# Patient Record
Sex: Female | Born: 1945
Health system: Southern US, Community
[De-identification: ages and names within clinical notes are randomized; demographics above are authoritative.]

## PROBLEM LIST (undated history)

## (undated) DIAGNOSIS — I10 Essential (primary) hypertension: Secondary | ICD-10-CM

## (undated) DIAGNOSIS — J449 Chronic obstructive pulmonary disease, unspecified: Secondary | ICD-10-CM

## (undated) DIAGNOSIS — K219 Gastro-esophageal reflux disease without esophagitis: Secondary | ICD-10-CM

## (undated) DIAGNOSIS — I5042 Chronic combined systolic (congestive) and diastolic (congestive) heart failure: Secondary | ICD-10-CM

## (undated) DIAGNOSIS — Z720 Tobacco use: Secondary | ICD-10-CM

## (undated) DIAGNOSIS — T7840XA Allergy, unspecified, initial encounter: Secondary | ICD-10-CM

## (undated) DIAGNOSIS — Z860100 Personal history of colon polyps, unspecified: Secondary | ICD-10-CM

## (undated) DIAGNOSIS — I272 Pulmonary hypertension, unspecified: Secondary | ICD-10-CM

## (undated) DIAGNOSIS — R0600 Dyspnea, unspecified: Secondary | ICD-10-CM

## (undated) DIAGNOSIS — I779 Disorder of arteries and arterioles, unspecified: Secondary | ICD-10-CM

## (undated) DIAGNOSIS — M1990 Unspecified osteoarthritis, unspecified site: Secondary | ICD-10-CM

## (undated) DIAGNOSIS — G459 Transient cerebral ischemic attack, unspecified: Secondary | ICD-10-CM

## (undated) DIAGNOSIS — I739 Peripheral vascular disease, unspecified: Secondary | ICD-10-CM

## (undated) DIAGNOSIS — E875 Hyperkalemia: Secondary | ICD-10-CM

## (undated) DIAGNOSIS — Z8719 Personal history of other diseases of the digestive system: Secondary | ICD-10-CM

## (undated) DIAGNOSIS — I420 Dilated cardiomyopathy: Secondary | ICD-10-CM

## (undated) DIAGNOSIS — D509 Iron deficiency anemia, unspecified: Secondary | ICD-10-CM

## (undated) DIAGNOSIS — I639 Cerebral infarction, unspecified: Secondary | ICD-10-CM

## (undated) DIAGNOSIS — N182 Chronic kidney disease, stage 2 (mild): Secondary | ICD-10-CM

## (undated) DIAGNOSIS — Z87898 Personal history of other specified conditions: Secondary | ICD-10-CM

## (undated) HISTORY — DX: Chronic combined systolic (congestive) and diastolic (congestive) heart failure: I50.42

## (undated) HISTORY — DX: Allergy, unspecified, initial encounter: T78.40XA

## (undated) HISTORY — DX: Transient cerebral ischemic attack, unspecified: G45.9

## (undated) HISTORY — DX: Iron deficiency anemia, unspecified: D50.9

## (undated) HISTORY — DX: Chronic obstructive pulmonary disease, unspecified: J44.9

## (undated) HISTORY — DX: Personal history of other diseases of the digestive system: Z87.19

## (undated) HISTORY — DX: Personal history of colonic polyps: Z86.010

## (undated) HISTORY — DX: Personal history of colon polyps, unspecified: Z86.0100

## (undated) HISTORY — DX: Dilated cardiomyopathy: I42.0

## (undated) HISTORY — DX: Pulmonary hypertension, unspecified: I27.20

## (undated) HISTORY — DX: Peripheral vascular disease, unspecified: I73.9

## (undated) HISTORY — DX: Chronic kidney disease, stage 2 (mild): N18.2

## (undated) HISTORY — DX: Unspecified osteoarthritis, unspecified site: M19.90

## (undated) HISTORY — DX: Hyperkalemia: E87.5

## (undated) HISTORY — PX: OTHER SURGICAL HISTORY: SHX169

## (undated) HISTORY — DX: Disorder of arteries and arterioles, unspecified: I77.9

## (undated) HISTORY — DX: Tobacco use: Z72.0

## (undated) HISTORY — DX: Cerebral infarction, unspecified: I63.9

## (undated) HISTORY — DX: Personal history of other specified conditions: Z87.898

## (undated) HISTORY — PX: TONSILLECTOMY: SUR1361

## (undated) HISTORY — DX: Gastro-esophageal reflux disease without esophagitis: K21.9

---

## 1979-06-09 HISTORY — PX: VAGINAL HYSTERECTOMY: SUR661

## 2005-02-07 ENCOUNTER — Emergency Department: Payer: Self-pay | Admitting: Internal Medicine

## 2009-04-29 ENCOUNTER — Inpatient Hospital Stay: Payer: Self-pay | Admitting: Orthopedic Surgery

## 2009-06-08 HISTORY — PX: ORIF DISTAL RADIUS FRACTURE: SUR927

## 2009-06-20 ENCOUNTER — Encounter: Payer: Self-pay | Admitting: Orthopedic Surgery

## 2009-07-09 ENCOUNTER — Encounter: Payer: Self-pay | Admitting: Orthopedic Surgery

## 2009-08-06 ENCOUNTER — Encounter: Payer: Self-pay | Admitting: Orthopedic Surgery

## 2009-09-06 ENCOUNTER — Encounter: Payer: Self-pay | Admitting: Orthopedic Surgery

## 2010-08-08 ENCOUNTER — Inpatient Hospital Stay: Payer: Self-pay | Admitting: Internal Medicine

## 2010-08-08 DIAGNOSIS — I059 Rheumatic mitral valve disease, unspecified: Secondary | ICD-10-CM

## 2010-08-16 ENCOUNTER — Inpatient Hospital Stay: Payer: Self-pay | Admitting: Internal Medicine

## 2010-09-05 ENCOUNTER — Ambulatory Visit: Payer: Self-pay | Admitting: Internal Medicine

## 2010-09-24 ENCOUNTER — Ambulatory Visit: Payer: Self-pay | Admitting: Internal Medicine

## 2010-10-03 ENCOUNTER — Other Ambulatory Visit: Payer: Self-pay | Admitting: Internal Medicine

## 2010-10-07 HISTORY — PX: COLONOSCOPY: SHX174

## 2010-10-14 ENCOUNTER — Ambulatory Visit: Payer: Self-pay | Admitting: Unknown Physician Specialty

## 2010-10-16 LAB — PATHOLOGY REPORT

## 2011-01-01 ENCOUNTER — Emergency Department: Payer: Self-pay | Admitting: Emergency Medicine

## 2011-01-06 ENCOUNTER — Inpatient Hospital Stay: Payer: Self-pay | Admitting: Internal Medicine

## 2011-01-06 ENCOUNTER — Ambulatory Visit: Payer: Self-pay | Admitting: Internal Medicine

## 2011-01-07 ENCOUNTER — Ambulatory Visit: Payer: Self-pay | Admitting: Internal Medicine

## 2011-01-16 ENCOUNTER — Ambulatory Visit: Payer: Self-pay | Admitting: Internal Medicine

## 2011-02-07 ENCOUNTER — Ambulatory Visit: Payer: Self-pay | Admitting: Internal Medicine

## 2011-02-10 ENCOUNTER — Inpatient Hospital Stay: Payer: Self-pay | Admitting: Internal Medicine

## 2011-02-11 DIAGNOSIS — R0602 Shortness of breath: Secondary | ICD-10-CM

## 2011-02-12 DIAGNOSIS — I509 Heart failure, unspecified: Secondary | ICD-10-CM

## 2011-02-24 ENCOUNTER — Encounter: Payer: Self-pay | Admitting: Cardiovascular Disease

## 2011-03-03 ENCOUNTER — Ambulatory Visit (INDEPENDENT_AMBULATORY_CARE_PROVIDER_SITE_OTHER): Payer: Self-pay | Admitting: Cardiovascular Disease

## 2011-03-03 ENCOUNTER — Encounter: Payer: Self-pay | Admitting: Cardiovascular Disease

## 2011-03-03 DIAGNOSIS — E785 Hyperlipidemia, unspecified: Secondary | ICD-10-CM

## 2011-03-03 DIAGNOSIS — F17200 Nicotine dependence, unspecified, uncomplicated: Secondary | ICD-10-CM | POA: Insufficient documentation

## 2011-03-03 DIAGNOSIS — I428 Other cardiomyopathies: Secondary | ICD-10-CM

## 2011-03-03 DIAGNOSIS — I2510 Atherosclerotic heart disease of native coronary artery without angina pectoris: Secondary | ICD-10-CM | POA: Insufficient documentation

## 2011-03-03 DIAGNOSIS — R0602 Shortness of breath: Secondary | ICD-10-CM | POA: Insufficient documentation

## 2011-03-03 MED ORDER — LISINOPRIL 10 MG PO TABS: 10.0000 mg | ORAL_TABLET | Freq: Every day | ORAL | Status: DC

## 2011-03-03 MED ORDER — FUROSEMIDE 20 MG PO TABS: 20.0000 mg | ORAL_TABLET | ORAL | Status: DC

## 2011-03-03 MED ORDER — SIMVASTATIN 20 MG PO TABS: 20.0000 mg | ORAL_TABLET | Freq: Every day | ORAL | Status: DC

## 2011-03-03 NOTE — Progress Notes (Signed)
Patient ID: Sandra Ellison, female    DOB: 1946/06/06, 65 y.o.   MRN: [id number]  HPI Comments: 65 year old woman with a history of peptic ulcer disease, systolic and diastolic CHF, COPD with long smoking history who continues to smoke, pulmonary hypertension seen on echocardiogram in March 2012 who presented to [facility] with increasing shortness of breath on February 11 2011. History of chronic renal insufficiency, stage II, severe iron deficiency anemia, She presents to the office to establish care and for followup from the hospital.  She reports that since her discharge, she has felt well. In the hospital, she was found to have CHF, moderate bilateral pleural effusions with BNP of 26,000. On her previous hospital admissions, she had not been given a diuretic and her shortness of breath had become aggressively worse.  There seems to have been some medication confusion. She reports that she has not been taking a diuretic but does have a prescription. She is taking Corag once a day. She denies any dizziness or lightheadedness. She is signing up for Medicare and this will be established later this year.  Echocardiogram performed February 11, 2011 shows ejection fraction less than 25% with severe global hypokinesis, apical akinesis, normal right ventricular systolic function , mild to moderate mitral valve regurgitation, normal right ventricular systolic pressures  Stress test done in the hospital on September 7 shows no significant ischemia. She has a dilated cart a moderately with ejection fraction 27%, old scar in the anteroseptal wall, inferior and inferolateral wall. This was a pharmacologic study  EKG today shows normal sinus rhythm with rate of 70 beats per minute T-wave abnormality in V2 through V6, one and aVL, diffuse low voltage    Outpatient Encounter Prescriptions as of 03/03/2011  Medication Sig Dispense Refill  . aspirin 81 MG tablet Take 81 mg by mouth daily.        . carvedilol  (COREG) 12.5 MG tablet Take 12.5 mg by mouth 2 (two) times daily with a meal.        . cetirizine (ZYRTEC) 10 MG tablet Take 10 mg by mouth daily.        . fluticasone (FLONASE) 50 MCG/ACT nasal spray Place 2 sprays into the nose daily.        . furosemide (LASIX) 20 MG tablet Take 1 tablet (20 mg total) by mouth every other day.   30 tablet  0  . pantoprazole (PROTONIX) 40 MG tablet Take 40 mg by mouth daily.        . potassium chloride (KLOR-CON) 10 MEQ CR tablet Take 10 mEq by mouth daily. As needed.       Marland Kitchen  lisinopril (PRINIVIL,ZESTRIL) 5 MG tablet Take 2.5 mg by mouth daily.        .  simvastatin (ZOCOR) 10 MG tablet Take 10 mg by mouth at bedtime.           Review of Systems  Constitutional: Negative.   HENT: Negative.   Eyes: Negative.   Respiratory: Negative.   Cardiovascular: Negative.   Gastrointestinal: Negative.   Musculoskeletal: Negative.   Skin: Negative.   Neurological: Negative.   Hematological: Negative.   Psychiatric/Behavioral: Negative.   All other systems reviewed and are negative.    BP 132/74  Pulse 73  Ht 5\' 5"  (1.651 m)  Wt 132 lb (59.875 kg)  BMI 21.97 kg/m2  Physical Exam  Nursing note and vitals reviewed. Constitutional: She is oriented to person, place, and time. She appears well-developed and well-nourished.  HENT:  Head: Normocephalic.  Nose: Nose normal.  Mouth/Throat: Oropharynx is clear and moist.  Eyes: Conjunctivae are normal. Pupils are equal, round, and reactive to light.  Neck: Normal range of motion. Neck supple. No JVD present.  Cardiovascular: Normal rate, regular rhythm, S1 normal, S2 normal and intact distal pulses.  Exam reveals no gallop and no friction rub.   Murmur heard.  Crescendo systolic murmur is present with a grade of 2/6       Bruit appreciated over the left subclavian  Pulmonary/Chest: Effort normal and breath sounds normal. No respiratory distress. She has no wheezes. She has no rales. She exhibits no  tenderness.  Abdominal: Soft. Bowel sounds are normal. She exhibits no distension. There is no tenderness.  Musculoskeletal: Normal range of motion. She exhibits no edema and no tenderness.  Lymphadenopathy:    She has no cervical adenopathy.  Neurological: She is alert and oriented to person, place, and time. Coordination normal.  Skin: Skin is warm and dry. No rash noted. No erythema.  Psychiatric: She has a normal mood and affect. Her behavior is normal. Judgment and thought content normal.         Assessment and Plan

## 2011-03-03 NOTE — Assessment & Plan Note (Signed)
We will increase his simvastatin to 20 mg daily and suggested we recheck her cholesterol in several months time.

## 2011-03-03 NOTE — Assessment & Plan Note (Signed)
Etiology of her cardiomyopathy with severely depressed ejection fraction is likely secondary to ischemia. Stress test shows several large regions of old scar though no active ischemia. No cardiac catheterization planned at this time unless she becomes more symptomatic. We did adjust her medications today and suggested she take Lasix every other day, take her Coreg 12.5 mg b.i.d. And increase the lisinopril in several weeks to 10 mg daily.

## 2011-03-03 NOTE — Assessment & Plan Note (Signed)
We did talk to her about coronary artery disease. This is likely the cause of her cardiomyopathy. We have stressed the importance of smoking cessation, aggressive cholesterol management.

## 2011-03-03 NOTE — Assessment & Plan Note (Signed)
We have suggested she has worsening shortness of breath that she take Lasix daily. This will be with potassium. If she continues to have weight gain and shortness of breath, we have suggested she take Lasix b.i.d..

## 2011-03-03 NOTE — Assessment & Plan Note (Signed)
We have strongly stressed the importance of smoking cessation. She will continue to work on this

## 2011-03-03 NOTE — Patient Instructions (Addendum)
Please take lasix/furosemide every other day Watch your weight. If it increases, take lasix every day Please increase the coreg to twice a day (12.5 mg ) Increase simvastatin to 20 mg daily In mid October, please increase lisinopril to 10 mg daily Stop smoking!!! Please call us if you have new issues that need to be addressed before your next appt.  We will call you for a follow up Appt. In 3 months

## 2011-03-04 ENCOUNTER — Encounter: Payer: Self-pay | Admitting: Cardiovascular Disease

## 2011-03-13 ENCOUNTER — Ambulatory Visit: Payer: Self-pay | Admitting: Internal Medicine

## 2011-03-16 ENCOUNTER — Other Ambulatory Visit: Payer: Self-pay

## 2011-04-06 ENCOUNTER — Encounter: Payer: Self-pay | Admitting: Cardiovascular Disease

## 2011-04-09 ENCOUNTER — Ambulatory Visit: Payer: Self-pay | Admitting: Internal Medicine

## 2011-04-20 ENCOUNTER — Other Ambulatory Visit: Payer: Self-pay

## 2011-04-20 MED ORDER — CARVEDILOL 12.5 MG PO TABS: 12.5000 mg | ORAL_TABLET | Freq: Two times a day (BID) | ORAL | Status: DC

## 2011-05-09 ENCOUNTER — Ambulatory Visit: Payer: Self-pay | Admitting: Internal Medicine

## 2011-06-08 ENCOUNTER — Ambulatory Visit (INDEPENDENT_AMBULATORY_CARE_PROVIDER_SITE_OTHER): Payer: 59 | Admitting: Cardiovascular Disease

## 2011-06-08 ENCOUNTER — Ambulatory Visit: Payer: Self-pay | Admitting: Cardiovascular Disease

## 2011-06-08 ENCOUNTER — Encounter: Payer: Self-pay | Admitting: Cardiovascular Disease

## 2011-06-08 DIAGNOSIS — I2510 Atherosclerotic heart disease of native coronary artery without angina pectoris: Secondary | ICD-10-CM

## 2011-06-08 DIAGNOSIS — R0602 Shortness of breath: Secondary | ICD-10-CM

## 2011-06-08 DIAGNOSIS — F17200 Nicotine dependence, unspecified, uncomplicated: Secondary | ICD-10-CM

## 2011-06-08 DIAGNOSIS — I428 Other cardiomyopathies: Secondary | ICD-10-CM

## 2011-06-08 DIAGNOSIS — E785 Hyperlipidemia, unspecified: Secondary | ICD-10-CM

## 2011-06-08 NOTE — Assessment & Plan Note (Signed)
Mild shortness of breath is likely secondary to underlying COPD. I am very concerned about recent upper respiratory tract infection and I suggested she go to acute care if this does not improve soon as she would likely need antibiotics given high risk of bronchitis.

## 2011-06-08 NOTE — Assessment & Plan Note (Signed)
We have suggested she stay on her simvastatin. Goal LDL less than 70.

## 2011-06-08 NOTE — Assessment & Plan Note (Signed)
Currently with no symptoms of angina. No further workup at this time. Continue current medication regimen. 

## 2011-06-08 NOTE — Assessment & Plan Note (Signed)
We have encouraged her to continue to work on weaning his cigarettes and smoking cessation. She will continue to work on this and does not want any assistance with chantix.   

## 2011-06-08 NOTE — Assessment & Plan Note (Addendum)
We have encouraged her to stay on her current medication regimen including diuretics, beta blocker, ACE inhibitor. She appears to be relatively asymptomatic. We will discuss with her whether she would like a repeat echocardiogram as her ejection fraction was significantly depressed earlier in the year. Uncertain if she will need a defibrillator given previous ejection fraction less than 35%.

## 2011-06-08 NOTE — Patient Instructions (Signed)
You are doing well. No medication changes were made.  Please call us if you have new issues that need to be addressed before your next appt.  Your physician wants you to follow-up in: 6 months.  You will receive a reminder letter in the mail two months in advance. If you don't receive a letter, please call our office to schedule the follow-up appointment.   

## 2011-06-08 NOTE — Progress Notes (Signed)
Patient ID: Sandra Ellison, female    DOB: [birth year], 65 y.o.   MRN: [id number]  HPI Comments: 65 year old woman with a history of peptic ulcer disease, systolic and diastolic CHF, COPD with long smoking history who continues to smoke, pulmonary hypertension seen on echocardiogram in March 2012 who presented to [facility] with increasing shortness of breath on February 11, 2011, chronic renal insufficiency, stage II, severe iron deficiency anemia, She presents to the office    In the hospital, she was found to have CHF, moderate bilateral pleural effusions with BNP of 26,000. On her previous hospital admissions, she had not been given a diuretic and her shortness of breath had become aggressively worse. Underlying anemia was a significant component of her presentation.  She has been receiving procrit every other week and has maintained her hemoglobin in the 9 range.  She did have a bone marrow biopsy, has received packed red blood cells x2, iron transfusion x2 She is followed by Dr. Sherrlyn Hock.  She was very worried recently as she started developing shortness of breath. She also had sore throat, cough, malaise and now believes it is a upper respiratory tract infection. She is still smoking 3 cigarettes per day  Echocardiogram performed February 11, 2011 shows ejection fraction less than 25% with severe global hypokinesis, apical akinesis, normal right ventricular systolic function , mild to moderate mitral valve regurgitation, normal right ventricular systolic pressures  Stress test done in the hospital on February 13 2011 shows no significant ischemia. She has a dilated cardiomyopathy with ejection fraction 27%, old scar in the anteroseptal wall, inferior and inferolateral wall. This was a pharmacologic study  EKG today shows normal sinus rhythm with rate of 70 beats per minute T-wave abnormality in V2 through V6, one and aVL, diffuse low voltage       Outpatient Encounter Prescriptions as of  06/08/2011  Medication Sig Dispense Refill  . aspirin 81 MG tablet Take 81 mg by mouth daily.        . carvedilol (COREG) 12.5 MG tablet Take 1 tablet (12.5 mg total) by mouth 2 (two) times daily with a meal.  60 tablet  3  . cetirizine (ZYRTEC) 10 MG tablet Take 10 mg by mouth daily.        . fluticasone (FLONASE) 50 MCG/ACT nasal spray Place 2 sprays into the nose daily.        . furosemide (LASIX) 20 MG tablet Take 1 tablet (20 mg total) by mouth every other day.  30 tablet  6  . lisinopril (PRINIVIL,ZESTRIL) 10 MG tablet Take 1 tablet (10 mg total) by mouth daily.  90 tablet  4  . pantoprazole (PROTONIX) 40 MG tablet Take 40 mg by mouth daily.        . potassium chloride (KLOR-CON) 10 MEQ CR tablet Take 10 mEq by mouth daily. As needed.       . simvastatin (ZOCOR) 20 MG tablet Take 1 tablet (20 mg total) by mouth at bedtime.  90 tablet  4     Review of Systems  Constitutional: Positive for fatigue.  HENT: Negative.   Eyes: Negative.   Respiratory: Positive for cough and shortness of breath.   Cardiovascular: Negative.   Gastrointestinal: Negative.   Musculoskeletal: Negative.   Skin: Negative.   Neurological: Negative.   Hematological: Negative.   Psychiatric/Behavioral: Negative.   All other systems reviewed and are negative.    BP 110/58  Pulse 65  Ht 5\' 5"  (1.651 m)  Wt 141  lb 12.8 oz (64.32 kg)  BMI 23.60 kg/m2  Physical Exam  Nursing note and vitals reviewed. Constitutional: She is oriented to person, place, and time. She appears well-developed and well-nourished.  HENT:  Head: Normocephalic.  Nose: Nose normal.  Mouth/Throat: Oropharynx is clear and moist.  Eyes: Conjunctivae are normal. Pupils are equal, round, and reactive to light.  Neck: Normal range of motion. Neck supple. No JVD present.  Cardiovascular: Normal rate, regular rhythm, S1 normal, S2 normal, normal heart sounds and intact distal pulses.  Exam reveals no gallop and no friction rub.   No murmur  heard. Pulmonary/Chest: Effort normal. No respiratory distress. She has decreased breath sounds. She has no wheezes. She has no rales. She exhibits no tenderness.  Abdominal: Soft. Bowel sounds are normal. She exhibits no distension. There is no tenderness.  Musculoskeletal: Normal range of motion. She exhibits no edema and no tenderness.  Lymphadenopathy:    She has no cervical adenopathy.  Neurological: She is alert and oriented to person, place, and time. Coordination normal.  Skin: Skin is warm and dry. No rash noted. No erythema.  Psychiatric: She has a normal mood and affect. Her behavior is normal. Judgment and thought content normal.         Assessment and Plan

## 2011-06-09 ENCOUNTER — Ambulatory Visit: Payer: Self-pay | Admitting: Internal Medicine

## 2011-06-10 ENCOUNTER — Telehealth: Payer: Self-pay | Admitting: *Deleted

## 2011-06-10 DIAGNOSIS — I519 Heart disease, unspecified: Secondary | ICD-10-CM

## 2011-06-10 NOTE — Telephone Encounter (Signed)
Per Dr. Mariah Milling, will order echo. If EF <30-35% (as before 25%), may need ICD. Attempted to contact pt, LMOM TCB.  Sandra Ellison, I put orders in, can you please schedule.

## 2011-06-16 LAB — CANCER CENTER HEMOGLOBIN: HGB: 11.3 g/dL — ABNORMAL LOW (ref 12.0–16.0)

## 2011-06-30 ENCOUNTER — Other Ambulatory Visit (INDEPENDENT_AMBULATORY_CARE_PROVIDER_SITE_OTHER): Payer: 59 | Admitting: *Deleted

## 2011-06-30 DIAGNOSIS — I519 Heart disease, unspecified: Secondary | ICD-10-CM

## 2011-06-30 DIAGNOSIS — I059 Rheumatic mitral valve disease, unspecified: Secondary | ICD-10-CM

## 2011-06-30 LAB — CANCER CENTER HEMOGLOBIN: HGB: 10.4 g/dL — ABNORMAL LOW (ref 12.0–16.0)

## 2011-07-10 ENCOUNTER — Ambulatory Visit: Payer: Self-pay | Admitting: Internal Medicine

## 2011-07-21 LAB — CANCER CENTER HEMOGLOBIN: HGB: 10.1 g/dL — ABNORMAL LOW (ref 12.0–16.0)

## 2011-07-28 LAB — CANCER CENTER HEMOGLOBIN: HGB: 8.7 g/dL — ABNORMAL LOW (ref 12.0–16.0)

## 2011-08-04 LAB — CBC CANCER CENTER
Basophil #: 0 x10 3/mm (ref 0.0–0.1)
Basophil %: 0.5 %
Eosinophil %: 1.2 %
HCT: 25.8 % — ABNORMAL LOW (ref 35.0–47.0)
HGB: 8.6 g/dL — ABNORMAL LOW (ref 12.0–16.0)
Lymphocyte %: 14.4 %
MCV: 87 fL (ref 80–100)
Monocyte %: 11.6 %
Neutrophil #: 5.4 x10 3/mm (ref 1.4–6.5)
WBC: 7.5 x10 3/mm (ref 3.6–11.0)

## 2011-08-04 LAB — IRON AND TIBC
Iron Bind.Cap.(Total): 331 ug/dL (ref 250–450)
Iron Saturation: 7 %
Unbound Iron-Bind.Cap.: 308 ug/dL

## 2011-08-07 ENCOUNTER — Ambulatory Visit: Payer: Self-pay | Admitting: Internal Medicine

## 2011-08-11 LAB — CANCER CENTER HEMOGLOBIN: HGB: 9.8 g/dL — ABNORMAL LOW (ref 12.0–16.0)

## 2011-08-17 ENCOUNTER — Other Ambulatory Visit: Payer: Self-pay | Admitting: Cardiovascular Disease

## 2011-08-17 ENCOUNTER — Other Ambulatory Visit: Payer: Self-pay | Admitting: *Deleted

## 2011-08-17 MED ORDER — FUROSEMIDE 20 MG PO TABS: 20.0000 mg | ORAL_TABLET | ORAL | Status: DC

## 2011-08-17 MED ORDER — CARVEDILOL 12.5 MG PO TABS: 12.5000 mg | ORAL_TABLET | Freq: Two times a day (BID) | ORAL | Status: DC

## 2011-08-17 NOTE — Telephone Encounter (Signed)
pt req refill for coreg 12.5 bid, sent in rx today for pt. Danielle Rankin

## 2011-08-18 LAB — CANCER CENTER HEMOGLOBIN: HGB: 10.1 g/dL — ABNORMAL LOW (ref 12.0–16.0)

## 2011-08-25 LAB — CANCER CENTER HEMOGLOBIN: HGB: 10.2 g/dL — ABNORMAL LOW (ref 12.0–16.0)

## 2011-09-01 LAB — CANCER CENTER HEMOGLOBIN: HGB: 10.8 g/dL — ABNORMAL LOW (ref 12.0–16.0)

## 2011-09-07 ENCOUNTER — Ambulatory Visit: Payer: Self-pay | Admitting: Internal Medicine

## 2011-09-08 ENCOUNTER — Other Ambulatory Visit: Payer: Self-pay | Admitting: Cardiovascular Disease

## 2011-09-10 ENCOUNTER — Telehealth: Payer: Self-pay

## 2011-09-10 MED ORDER — FUROSEMIDE 20 MG PO TABS: 20.0000 mg | ORAL_TABLET | ORAL | Status: DC

## 2011-09-10 NOTE — Telephone Encounter (Signed)
Refill sent for furosemide 20 mg take one tablet every other day.

## 2011-09-22 LAB — CANCER CENTER HEMOGLOBIN: HGB: 9.2 g/dL — ABNORMAL LOW (ref 12.0–16.0)

## 2011-09-29 LAB — CANCER CENTER HEMOGLOBIN: HGB: 10.3 g/dL — ABNORMAL LOW (ref 12.0–16.0)

## 2011-10-06 LAB — CANCER CENTER HEMOGLOBIN: HGB: 10.6 g/dL — ABNORMAL LOW (ref 12.0–16.0)

## 2011-10-07 ENCOUNTER — Ambulatory Visit: Payer: Self-pay | Admitting: Internal Medicine

## 2011-10-19 ENCOUNTER — Other Ambulatory Visit: Payer: Self-pay | Admitting: *Deleted

## 2011-10-19 MED ORDER — POTASSIUM CHLORIDE ER 10 MEQ PO TBCR: 10.0000 meq | EXTENDED_RELEASE_TABLET | Freq: Every day | ORAL | Status: DC

## 2011-10-19 NOTE — Telephone Encounter (Signed)
Refilled Potassium chloride.

## 2011-10-20 LAB — IRON AND TIBC
Iron Saturation: 17 %
Iron: 55 ug/dL (ref 50–170)
Unbound Iron-Bind.Cap.: 266 ug/dL

## 2011-10-20 LAB — CANCER CENTER HEMOGLOBIN: HGB: 10.4 g/dL — ABNORMAL LOW (ref 12.0–16.0)

## 2011-10-20 LAB — FERRITIN: Ferritin (ARMC): 59 ng/mL (ref 8–388)

## 2011-10-27 LAB — CBC CANCER CENTER
Basophil #: 0.1 x10 3/mm (ref 0.0–0.1)
Basophil %: 0.7 %
Eosinophil %: 1.9 %
Lymphocyte #: 0.9 x10 3/mm — ABNORMAL LOW (ref 1.0–3.6)
Lymphocyte %: 11.1 %
MCH: 29.5 pg (ref 26.0–34.0)
MCHC: 32.1 g/dL (ref 32.0–36.0)
Monocyte #: 0.7 x10 3/mm (ref 0.2–0.9)
Platelet: 237 x10 3/mm (ref 150–440)
RBC: 3.38 10*6/uL — ABNORMAL LOW (ref 3.80–5.20)
RDW: 15.4 % — ABNORMAL HIGH (ref 11.5–14.5)
WBC: 7.8 x10 3/mm (ref 3.6–11.0)

## 2011-11-07 ENCOUNTER — Ambulatory Visit: Payer: Self-pay | Admitting: Internal Medicine

## 2011-12-07 ENCOUNTER — Ambulatory Visit: Payer: 59 | Admitting: Cardiovascular Disease

## 2011-12-13 ENCOUNTER — Other Ambulatory Visit: Payer: Self-pay | Admitting: Cardiovascular Disease

## 2011-12-16 ENCOUNTER — Ambulatory Visit: Payer: 59 | Admitting: Cardiovascular Disease

## 2011-12-25 ENCOUNTER — Ambulatory Visit: Payer: 59 | Admitting: Cardiovascular Disease

## 2012-01-15 ENCOUNTER — Ambulatory Visit: Payer: 59 | Admitting: Cardiovascular Disease

## 2012-01-19 ENCOUNTER — Ambulatory Visit: Payer: Self-pay | Admitting: Internal Medicine

## 2012-01-19 LAB — FERRITIN: Ferritin (ARMC): 36 ng/mL (ref 8–388)

## 2012-01-19 LAB — IRON AND TIBC
Iron Bind.Cap.(Total): 328 ug/dL (ref 250–450)
Iron Saturation: 13 %
Iron: 43 ug/dL — ABNORMAL LOW (ref 50–170)

## 2012-01-19 LAB — CANCER CENTER HEMOGLOBIN: HGB: 7.8 g/dL — ABNORMAL LOW (ref 12.0–16.0)

## 2012-02-05 LAB — APTT: Activated PTT: 37.6 secs — ABNORMAL HIGH (ref 23.6–35.9)

## 2012-02-05 LAB — CBC
MCH: 32.6 pg (ref 26.0–34.0)
MCHC: 33.5 g/dL (ref 32.0–36.0)
MCV: 97 fL (ref 80–100)
Platelet: 316 10*3/uL (ref 150–440)
RDW: 13.7 % (ref 11.5–14.5)
WBC: 8 10*3/uL (ref 3.6–11.0)

## 2012-02-05 LAB — URINALYSIS, COMPLETE
Bilirubin,UR: NEGATIVE
Blood: NEGATIVE
Granular Cast: 15
Ketone: NEGATIVE
Nitrite: NEGATIVE
Ph: 5 (ref 4.5–8.0)
Protein: NEGATIVE
RBC,UR: 2 /HPF (ref 0–5)

## 2012-02-05 LAB — COMPREHENSIVE METABOLIC PANEL
Anion Gap: 4 — ABNORMAL LOW (ref 7–16)
Bilirubin,Total: 0.2 mg/dL (ref 0.2–1.0)
Calcium, Total: 8.3 mg/dL — ABNORMAL LOW (ref 8.5–10.1)
Chloride: 108 mmol/L — ABNORMAL HIGH (ref 98–107)
Co2: 25 mmol/L (ref 21–32)
Creatinine: 2.96 mg/dL — ABNORMAL HIGH (ref 0.60–1.30)
EGFR (African American): 18 — ABNORMAL LOW
EGFR (Non-African Amer.): 16 — ABNORMAL LOW
Glucose: 93 mg/dL (ref 65–99)
Potassium: 5.6 mmol/L — ABNORMAL HIGH (ref 3.5–5.1)
SGOT(AST): 19 U/L (ref 15–37)
Sodium: 137 mmol/L (ref 136–145)
Total Protein: 6.7 g/dL (ref 6.4–8.2)

## 2012-02-05 LAB — PROTIME-INR
INR: 1
Prothrombin Time: 13.9 secs (ref 11.5–14.7)

## 2012-02-05 LAB — CANCER CENTER HEMOGLOBIN: HGB: 8.5 g/dL — ABNORMAL LOW (ref 12.0–16.0)

## 2012-02-06 ENCOUNTER — Inpatient Hospital Stay: Payer: Self-pay | Admitting: Internal Medicine

## 2012-02-06 DIAGNOSIS — I059 Rheumatic mitral valve disease, unspecified: Secondary | ICD-10-CM

## 2012-02-06 LAB — CBC WITH DIFFERENTIAL/PLATELET
Basophil #: 0 10*3/uL (ref 0.0–0.1)
Basophil %: 0.6 %
Eosinophil #: 0.2 10*3/uL (ref 0.0–0.7)
HCT: 23.1 % — ABNORMAL LOW (ref 35.0–47.0)
HGB: 7.8 g/dL — ABNORMAL LOW (ref 12.0–16.0)
Lymphocyte %: 21.4 %
MCH: 32.3 pg (ref 26.0–34.0)
MCHC: 33.9 g/dL (ref 32.0–36.0)
Monocyte %: 10.8 %
Neutrophil #: 4.3 10*3/uL (ref 1.4–6.5)
Neutrophil %: 64.7 %

## 2012-02-06 LAB — BASIC METABOLIC PANEL
Anion Gap: 9 (ref 7–16)
BUN: 52 mg/dL — ABNORMAL HIGH (ref 7–18)
Chloride: 108 mmol/L — ABNORMAL HIGH (ref 98–107)
EGFR (African American): 15 — ABNORMAL LOW
EGFR (Non-African Amer.): 13 — ABNORMAL LOW

## 2012-02-06 LAB — MAGNESIUM: Magnesium: 1.8 mg/dL

## 2012-02-07 ENCOUNTER — Ambulatory Visit: Payer: Self-pay | Admitting: Internal Medicine

## 2012-02-07 LAB — BASIC METABOLIC PANEL
Anion Gap: 8 (ref 7–16)
Calcium, Total: 8.5 mg/dL (ref 8.5–10.1)
Chloride: 113 mmol/L — ABNORMAL HIGH (ref 98–107)
Co2: 21 mmol/L (ref 21–32)
EGFR (African American): 27 — ABNORMAL LOW
Glucose: 76 mg/dL (ref 65–99)
Osmolality: 292 (ref 275–301)
Potassium: 4.8 mmol/L (ref 3.5–5.1)

## 2012-02-19 LAB — CANCER CENTER HEMOGLOBIN: HGB: 9.1 g/dL — ABNORMAL LOW (ref 12.0–16.0)

## 2012-02-26 ENCOUNTER — Encounter: Payer: Self-pay | Admitting: Cardiovascular Disease

## 2012-02-26 ENCOUNTER — Telehealth: Payer: Self-pay

## 2012-02-26 ENCOUNTER — Ambulatory Visit (INDEPENDENT_AMBULATORY_CARE_PROVIDER_SITE_OTHER): Payer: Medicare HMO | Admitting: Cardiovascular Disease

## 2012-02-26 VITALS — BP 128/84 | HR 78 | Ht 65.0 in | Wt 150.2 lb

## 2012-02-26 DIAGNOSIS — R0602 Shortness of breath: Secondary | ICD-10-CM

## 2012-02-26 DIAGNOSIS — I2510 Atherosclerotic heart disease of native coronary artery without angina pectoris: Secondary | ICD-10-CM

## 2012-02-26 DIAGNOSIS — R42 Dizziness and giddiness: Secondary | ICD-10-CM

## 2012-02-26 DIAGNOSIS — IMO0001 Reserved for inherently not codable concepts without codable children: Secondary | ICD-10-CM

## 2012-02-26 DIAGNOSIS — E785 Hyperlipidemia, unspecified: Secondary | ICD-10-CM

## 2012-02-26 DIAGNOSIS — I428 Other cardiomyopathies: Secondary | ICD-10-CM

## 2012-02-26 DIAGNOSIS — F17200 Nicotine dependence, unspecified, uncomplicated: Secondary | ICD-10-CM

## 2012-02-26 DIAGNOSIS — R0989 Other specified symptoms and signs involving the circulatory and respiratory systems: Secondary | ICD-10-CM

## 2012-02-26 MED ORDER — TIOTROPIUM BROMIDE MONOHYDRATE 18 MCG IN CAPS: 18.0000 ug | ORAL_CAPSULE | Freq: Every day | RESPIRATORY_TRACT | Status: DC

## 2012-02-26 MED ORDER — ALBUTEROL SULFATE HFA 108 (90 BASE) MCG/ACT IN AERS: 2.0000 | INHALATION_SPRAY | Freq: Four times a day (QID) | RESPIRATORY_TRACT | Status: DC | PRN

## 2012-02-26 MED ORDER — LISINOPRIL 10 MG PO TABS: 10.0000 mg | ORAL_TABLET | Freq: Every day | ORAL | Status: DC

## 2012-02-26 NOTE — Assessment & Plan Note (Signed)
Right side. We will order carotid ultrasound.

## 2012-02-26 NOTE — Assessment & Plan Note (Signed)
Recent ejection fraction by echo 30-35%. Renal failure last month possibly from hypovolemia. We will recheck her basic metabolic panel. Continue carvedilol, Lasix and ACE inhibitor on hold until labs return.

## 2012-02-26 NOTE — Assessment & Plan Note (Signed)
We have suggested she stay on her statin. 

## 2012-02-26 NOTE — Telephone Encounter (Signed)
Pt called back I explained she should NOT resume lisinopril until we check BMP. She verb. Understanding and will come in next week for this

## 2012-02-26 NOTE — Progress Notes (Signed)
Patient ID: Sandra Ellison, female    DOB: 09-21-1945, 66 y.o.   MRN: [id number]  HPI Comments: 66 year old woman with a history of peptic ulcer disease, nonischemic cardiomyopathy with history of systolic and diastolic CHF, COPD with long smoking history who continues to smoke, pulmonary hypertension seen on echocardiogram in March 2012 who presented to [facility] with increasing shortness of breath on February 11, 2011, chronic renal insufficiency, stage II, severe iron deficiency anemia, She presents to the office for routine followup   In the hospital, she was found to have CHF, moderate bilateral pleural effusions with BNP of 26,000. Underlying anemia was a significant component of her presentation.  She has been receiving procrit every other week and has maintained her hemoglobin in the 9 range.  She did have a bone marrow biopsy, has received packed red blood cells x2, iron transfusion x2 She is followed by Dr. Sherrlyn Hock.  She reports having recent infiltration of her IV when receiving a procrit shot. She did not have breakfast or lunch and had a near-syncope episode. She was taken to the emergency room and kept overnight, received IV fluids. She was noted to have acute renal failure. Her diuretic was held, potassium and lisinopril were held. Today she feels well. Her creatinine in the hospital is in the high 2 range, increasing to 3.5 on August 31  Echocardiogram in the hospital showed ejection fraction 30-35%, mild to moderate right ventricular systolic pressure estimated at 40-50 mmHg She is still smoking  Echocardiogram performed February 11, 2011 shows ejection fraction less than 25% with severe global hypokinesis, apical akinesis, normal right ventricular systolic function , mild to moderate mitral valve regurgitation, normal right ventricular systolic pressures  Stress test done in the hospital on February 13 2011 shows no significant ischemia. She has a dilated cardiomyopathy with ejection  fraction 27%, old scar in the anteroseptal wall, inferior and inferolateral wall. This was a pharmacologic study  EKG today shows normal sinus rhythm with rate of 78 beats per minute T-wave abnormality in V3 through V6, one and aVL, diffuse low voltage       Outpatient Encounter Prescriptions as of 02/26/2012  Medication Sig Dispense Refill  . aspirin 81 MG tablet Take 81 mg by mouth daily.        . carvedilol (COREG) 12.5 MG tablet TAKE ONE TABLET BY MOUTH TWICE DAILY WITH MEALS  60 tablet  6  . cetirizine (ZYRTEC) 10 MG tablet Take 10 mg by mouth daily.        . fluticasone (FLONASE) 50 MCG/ACT nasal spray Place 2 sprays into the nose daily.        . furosemide (LASIX) 20 MG tablet Take 20 mg by mouth as needed.      . pantoprazole (PROTONIX) 40 MG tablet Take 40 mg by mouth daily.        . potassium chloride (K-DUR) 10 MEQ tablet Take 10 mEq by mouth as needed.      . simvastatin (ZOCOR) 20 MG tablet Take 1 tablet (20 mg total) by mouth at bedtime.  90 tablet  4  . DISCONTD: furosemide (LASIX) 20 MG tablet Take 1 tablet (20 mg total) by mouth every other day.  30 tablet  6  . DISCONTD: potassium chloride (K-DUR) 10 MEQ tablet Take 1 tablet (10 mEq total) by mouth daily.  30 tablet  1  . albuterol (PROVENTIL HFA;VENTOLIN HFA) 108 (90 BASE) MCG/ACT inhaler Inhale 2 puffs into the lungs every 6 (six) hours as  needed for wheezing.  1 Inhaler  2  . tiotropium (SPIRIVA HANDIHALER) 18 MCG inhalation capsule Place 1 capsule (18 mcg total) into inhaler and inhale daily.  30 capsule  12     Review of Systems  HENT: Negative.   Eyes: Negative.   Cardiovascular: Negative.   Gastrointestinal: Negative.   Musculoskeletal: Negative.   Skin: Negative.   Neurological: Negative.   Hematological: Negative.   Psychiatric/Behavioral: Negative.   All other systems reviewed and are negative.    BP 128/84  Pulse 78  Ht 5\' 5"  (1.651 m)  Wt 150 lb 4 oz (68.153 kg)  BMI 25.00 kg/m2  Physical Exam    Nursing note and vitals reviewed. Constitutional: She is oriented to person, place, and time. She appears well-developed and well-nourished.  HENT:  Head: Normocephalic.  Nose: Nose normal.  Mouth/Throat: Oropharynx is clear and moist.  Eyes: Conjunctivae normal are normal. Pupils are equal, round, and reactive to light.  Neck: Normal range of motion. Neck supple. No JVD present. Carotid bruit is present.  Cardiovascular: Normal rate, regular rhythm, S1 normal, S2 normal, normal heart sounds and intact distal pulses.  Exam reveals no gallop and no friction rub.   No murmur heard. Pulses:      Carotid pulses are 1+ on the right side. Pulmonary/Chest: Effort normal. No respiratory distress. She has decreased breath sounds. She has no wheezes. She has no rales. She exhibits no tenderness.  Abdominal: Soft. Bowel sounds are normal. She exhibits no distension. There is no tenderness.  Musculoskeletal: Normal range of motion. She exhibits no edema and no tenderness.  Lymphadenopathy:    She has no cervical adenopathy.  Neurological: She is alert and oriented to person, place, and time. Coordination normal.  Skin: Skin is warm and dry. No rash noted. No erythema.  Psychiatric: She has a normal mood and affect. Her behavior is normal. Judgment and thought content normal.         Assessment and Plan

## 2012-02-26 NOTE — Telephone Encounter (Signed)
lmtcb re: need to check BMP next week and to NOT start lisinopril, Per Dr. Mariah Milling

## 2012-02-26 NOTE — Assessment & Plan Note (Signed)
We have encouraged her to continue to work on weaning her cigarettes and smoking cessation. She will continue to work on this and does not want any assistance with chantix.  

## 2012-02-26 NOTE — Assessment & Plan Note (Signed)
Mild to moderate pulmonary hypertension on recent echocardiogram, likely underlying COPD. She continues to smoke. Lasix on hold given renal dysfunction.

## 2012-02-26 NOTE — Assessment & Plan Note (Signed)
Negative stress test last year.

## 2012-02-26 NOTE — Patient Instructions (Addendum)
You are doing well.  Do NOT restart lisinopril until we recheck creatinine Keep your weight at its current weight. Take lasix as needed to maintain your weight at current level  Please call us if you have new issues that need to be addressed before your next appt.  Your physician wants you to follow-up in: 6 months.  You will receive a reminder letter in the mail two months in advance. If you don't receive a letter, please call our office to schedule the follow-up appointment.  Come in for lab work next week

## 2012-02-29 ENCOUNTER — Other Ambulatory Visit (INDEPENDENT_AMBULATORY_CARE_PROVIDER_SITE_OTHER): Payer: Medicare HMO

## 2012-02-29 DIAGNOSIS — R42 Dizziness and giddiness: Secondary | ICD-10-CM

## 2012-03-01 LAB — BASIC METABOLIC PANEL
BUN: 17 mg/dL (ref 8–27)
CO2: 15 mmol/L — ABNORMAL LOW (ref 19–28)
Calcium: 9 mg/dL (ref 8.6–10.2)
Creatinine, Ser: 1.62 mg/dL — ABNORMAL HIGH (ref 0.57–1.00)
GFR calc non Af Amer: 33 mL/min/{1.73_m2} — ABNORMAL LOW (ref >59)
Glucose: 110 mg/dL — ABNORMAL HIGH (ref 65–99)
Sodium: 140 mmol/L (ref 134–144)

## 2012-03-08 ENCOUNTER — Ambulatory Visit: Payer: Self-pay | Admitting: Internal Medicine

## 2012-03-14 ENCOUNTER — Other Ambulatory Visit: Payer: Self-pay | Admitting: *Deleted

## 2012-03-14 MED ORDER — SIMVASTATIN 20 MG PO TABS: 20.0000 mg | ORAL_TABLET | Freq: Every day | ORAL | Status: DC

## 2012-03-14 NOTE — Telephone Encounter (Signed)
Refilled Simvastatin. 

## 2012-03-17 LAB — CANCER CENTER HEMOGLOBIN: HGB: 10.8 g/dL — ABNORMAL LOW (ref 12.0–16.0)

## 2012-03-24 ENCOUNTER — Telehealth: Payer: Self-pay

## 2012-03-24 MED ORDER — SIMVASTATIN 20 MG PO TABS: 20.0000 mg | ORAL_TABLET | Freq: Every day | ORAL | Status: DC

## 2012-03-24 NOTE — Telephone Encounter (Signed)
Refill sent for simvastatin 20 mg one tablet

## 2012-04-01 LAB — FERRITIN: Ferritin (ARMC): 92 ng/mL (ref 8–388)

## 2012-04-01 LAB — IRON AND TIBC
Iron: 59 ug/dL (ref 50–170)
Unbound Iron-Bind.Cap.: 269 ug/dL

## 2012-04-01 LAB — CANCER CENTER HEMOGLOBIN: HGB: 11.1 g/dL — ABNORMAL LOW (ref 12.0–16.0)

## 2012-04-08 ENCOUNTER — Ambulatory Visit: Payer: Self-pay | Admitting: Internal Medicine

## 2012-04-15 LAB — CBC CANCER CENTER
Basophil #: 0 x10 3/mm (ref 0.0–0.1)
Basophil %: 0.6 %
Eosinophil #: 0.1 x10 3/mm (ref 0.0–0.7)
Eosinophil %: 1.8 %
HCT: 33.6 % — ABNORMAL LOW (ref 35.0–47.0)
Lymphocyte #: 1 x10 3/mm (ref 1.0–3.6)
MCH: 30.6 pg (ref 26.0–34.0)
MCHC: 32.5 g/dL (ref 32.0–36.0)
MCV: 94 fL (ref 80–100)
Neutrophil #: 5.4 x10 3/mm (ref 1.4–6.5)
Platelet: 263 x10 3/mm (ref 150–440)
RDW: 14.1 % (ref 11.5–14.5)
WBC: 7.3 x10 3/mm (ref 3.6–11.0)

## 2012-05-08 ENCOUNTER — Ambulatory Visit: Payer: Self-pay | Admitting: Internal Medicine

## 2012-06-10 ENCOUNTER — Ambulatory Visit: Payer: Self-pay | Admitting: Internal Medicine

## 2012-06-10 LAB — IRON AND TIBC
Iron Bind.Cap.(Total): 275 ug/dL (ref 250–450)
Iron Saturation: 18 %
Iron: 49 ug/dL — ABNORMAL LOW (ref 50–170)
Unbound Iron-Bind.Cap.: 226 ug/dL

## 2012-06-10 LAB — FERRITIN: Ferritin (ARMC): 108 ng/mL (ref 8–388)

## 2012-06-10 LAB — CANCER CENTER HEMOGLOBIN: HGB: 11.8 g/dL — ABNORMAL LOW (ref 12.0–16.0)

## 2012-06-15 ENCOUNTER — Other Ambulatory Visit: Payer: Self-pay

## 2012-06-15 MED ORDER — SIMVASTATIN 20 MG PO TABS: 20.0000 mg | ORAL_TABLET | Freq: Every day | ORAL | Status: DC

## 2012-06-15 NOTE — Telephone Encounter (Signed)
Refill sent for simvastatin 20 mg  

## 2012-06-20 ENCOUNTER — Other Ambulatory Visit: Payer: Self-pay | Admitting: *Deleted

## 2012-06-20 MED ORDER — SIMVASTATIN 20 MG PO TABS: 20.0000 mg | ORAL_TABLET | Freq: Every day | ORAL | Status: DC

## 2012-06-20 NOTE — Telephone Encounter (Signed)
Refilled Simvastatin. 

## 2012-07-09 ENCOUNTER — Ambulatory Visit: Payer: Self-pay | Admitting: Internal Medicine

## 2012-07-23 ENCOUNTER — Other Ambulatory Visit: Payer: Self-pay

## 2012-07-28 ENCOUNTER — Other Ambulatory Visit: Payer: Self-pay | Admitting: Cardiovascular Disease

## 2012-07-29 ENCOUNTER — Other Ambulatory Visit: Payer: Self-pay | Admitting: *Deleted

## 2012-07-29 MED ORDER — CARVEDILOL 12.5 MG PO TABS: ORAL_TABLET | ORAL | Status: DC

## 2012-07-29 NOTE — Telephone Encounter (Signed)
Refilled Carvedilol. 

## 2012-08-05 LAB — CBC CANCER CENTER
Basophil #: 0 x10 3/mm (ref 0.0–0.1)
Eosinophil #: 0.1 x10 3/mm (ref 0.0–0.7)
Eosinophil %: 2.1 %
Lymphocyte %: 13.7 %
MCH: 29.7 pg (ref 26.0–34.0)
Monocyte #: 0.6 x10 3/mm (ref 0.2–0.9)
Monocyte %: 10 %
Neutrophil #: 4.2 x10 3/mm (ref 1.4–6.5)
Neutrophil %: 73.5 %
Platelet: 269 x10 3/mm (ref 150–440)

## 2012-08-05 LAB — FERRITIN: Ferritin (ARMC): 69 ng/mL (ref 8–388)

## 2012-08-05 LAB — IRON AND TIBC
Iron Bind.Cap.(Total): 321 ug/dL (ref 250–450)
Iron Saturation: 14 %

## 2012-08-06 ENCOUNTER — Ambulatory Visit: Payer: Self-pay | Admitting: Internal Medicine

## 2012-08-31 ENCOUNTER — Other Ambulatory Visit: Payer: Self-pay

## 2012-08-31 MED ORDER — ALBUTEROL SULFATE HFA 108 (90 BASE) MCG/ACT IN AERS: 2.0000 | INHALATION_SPRAY | Freq: Four times a day (QID) | RESPIRATORY_TRACT | Status: DC | PRN

## 2012-08-31 NOTE — Telephone Encounter (Signed)
Patient doesn't have a PCP. Refill sent for ProAir HFA.

## 2012-09-06 ENCOUNTER — Ambulatory Visit: Payer: Self-pay | Admitting: Internal Medicine

## 2012-09-30 LAB — FERRITIN: Ferritin (ARMC): 78 ng/mL (ref 8–388)

## 2012-09-30 LAB — IRON AND TIBC
Iron Bind.Cap.(Total): 342 ug/dL (ref 250–450)
Iron Saturation: 15 %
Iron: 52 ug/dL (ref 50–170)
Unbound Iron-Bind.Cap.: 290 ug/dL

## 2012-09-30 LAB — CANCER CENTER HEMOGLOBIN: HGB: 13.3 g/dL (ref 12.0–16.0)

## 2012-10-06 ENCOUNTER — Ambulatory Visit: Payer: Self-pay | Admitting: Internal Medicine

## 2012-10-18 ENCOUNTER — Encounter: Payer: Self-pay | Admitting: Internal Medicine

## 2012-11-23 ENCOUNTER — Ambulatory Visit: Payer: Medicare HMO | Admitting: Internal Medicine

## 2012-12-06 ENCOUNTER — Ambulatory Visit: Payer: Self-pay | Admitting: Internal Medicine

## 2012-12-06 LAB — CBC CANCER CENTER
Basophil #: 0 x10 3/mm (ref 0.0–0.1)
Eosinophil #: 0.1 x10 3/mm (ref 0.0–0.7)
HCT: 38.3 % (ref 35.0–47.0)
HGB: 13.1 g/dL (ref 12.0–16.0)
Lymphocyte #: 1.1 x10 3/mm (ref 1.0–3.6)
Lymphocyte %: 14.5 %
MCHC: 34.1 g/dL (ref 32.0–36.0)
Monocyte #: 0.8 x10 3/mm (ref 0.2–0.9)
Neutrophil %: 72.3 %
RDW: 15.4 % — ABNORMAL HIGH (ref 11.5–14.5)
WBC: 7.8 x10 3/mm (ref 3.6–11.0)

## 2012-12-06 LAB — IRON AND TIBC
Iron Bind.Cap.(Total): 343 ug/dL (ref 250–450)
Iron Saturation: 24 %
Iron: 81 ug/dL (ref 50–170)

## 2012-12-06 LAB — FERRITIN: Ferritin (ARMC): 112 ng/mL (ref 8–388)

## 2012-12-23 ENCOUNTER — Ambulatory Visit: Payer: Medicare HMO | Admitting: Internal Medicine

## 2013-01-06 ENCOUNTER — Ambulatory Visit: Payer: Self-pay | Admitting: Internal Medicine

## 2013-01-11 ENCOUNTER — Other Ambulatory Visit: Payer: Self-pay

## 2013-02-26 ENCOUNTER — Other Ambulatory Visit: Payer: Self-pay | Admitting: Cardiovascular Disease

## 2013-02-27 ENCOUNTER — Ambulatory Visit (INDEPENDENT_AMBULATORY_CARE_PROVIDER_SITE_OTHER): Payer: Medicare HMO | Admitting: Internal Medicine

## 2013-02-27 ENCOUNTER — Encounter: Payer: Self-pay | Admitting: Internal Medicine

## 2013-02-27 VITALS — BP 110/90 | HR 81 | Temp 98.1°F | Ht 64.25 in | Wt 135.5 lb

## 2013-02-27 DIAGNOSIS — E785 Hyperlipidemia, unspecified: Secondary | ICD-10-CM

## 2013-02-27 DIAGNOSIS — F17200 Nicotine dependence, unspecified, uncomplicated: Secondary | ICD-10-CM

## 2013-02-27 DIAGNOSIS — R0602 Shortness of breath: Secondary | ICD-10-CM

## 2013-02-27 DIAGNOSIS — K5790 Diverticulosis of intestine, part unspecified, without perforation or abscess without bleeding: Secondary | ICD-10-CM

## 2013-02-27 DIAGNOSIS — Z860100 Personal history of colon polyps, unspecified: Secondary | ICD-10-CM

## 2013-02-27 DIAGNOSIS — I429 Cardiomyopathy, unspecified: Secondary | ICD-10-CM

## 2013-02-27 DIAGNOSIS — I2789 Other specified pulmonary heart diseases: Secondary | ICD-10-CM

## 2013-02-27 DIAGNOSIS — I2510 Atherosclerotic heart disease of native coronary artery without angina pectoris: Secondary | ICD-10-CM

## 2013-02-27 DIAGNOSIS — D649 Anemia, unspecified: Secondary | ICD-10-CM

## 2013-02-27 DIAGNOSIS — J449 Chronic obstructive pulmonary disease, unspecified: Secondary | ICD-10-CM

## 2013-02-27 DIAGNOSIS — K219 Gastro-esophageal reflux disease without esophagitis: Secondary | ICD-10-CM

## 2013-02-27 DIAGNOSIS — I272 Pulmonary hypertension, unspecified: Secondary | ICD-10-CM

## 2013-02-27 DIAGNOSIS — K5730 Diverticulosis of large intestine without perforation or abscess without bleeding: Secondary | ICD-10-CM

## 2013-02-27 DIAGNOSIS — I428 Other cardiomyopathies: Secondary | ICD-10-CM

## 2013-02-27 DIAGNOSIS — IMO0001 Reserved for inherently not codable concepts without codable children: Secondary | ICD-10-CM

## 2013-02-27 MED ORDER — FUROSEMIDE 20 MG PO TABS: 20.0000 mg | ORAL_TABLET | ORAL | Status: DC | PRN

## 2013-02-27 MED ORDER — ALBUTEROL SULFATE HFA 108 (90 BASE) MCG/ACT IN AERS: 2.0000 | INHALATION_SPRAY | Freq: Four times a day (QID) | RESPIRATORY_TRACT | Status: DC | PRN

## 2013-02-27 MED ORDER — TIOTROPIUM BROMIDE MONOHYDRATE 18 MCG IN CAPS: 18.0000 ug | ORAL_CAPSULE | Freq: Every day | RESPIRATORY_TRACT | Status: DC

## 2013-02-28 ENCOUNTER — Other Ambulatory Visit: Payer: Self-pay | Admitting: Cardiovascular Disease

## 2013-02-28 ENCOUNTER — Encounter: Payer: Self-pay | Admitting: Internal Medicine

## 2013-02-28 DIAGNOSIS — Z860100 Personal history of colon polyps, unspecified: Secondary | ICD-10-CM | POA: Insufficient documentation

## 2013-02-28 DIAGNOSIS — K219 Gastro-esophageal reflux disease without esophagitis: Secondary | ICD-10-CM | POA: Insufficient documentation

## 2013-02-28 DIAGNOSIS — I272 Pulmonary hypertension, unspecified: Secondary | ICD-10-CM | POA: Insufficient documentation

## 2013-02-28 DIAGNOSIS — K5790 Diverticulosis of intestine, part unspecified, without perforation or abscess without bleeding: Secondary | ICD-10-CM | POA: Insufficient documentation

## 2013-02-28 DIAGNOSIS — D649 Anemia, unspecified: Secondary | ICD-10-CM | POA: Insufficient documentation

## 2013-02-28 DIAGNOSIS — J449 Chronic obstructive pulmonary disease, unspecified: Secondary | ICD-10-CM | POA: Insufficient documentation

## 2013-02-28 NOTE — Assessment & Plan Note (Signed)
Usually controlled on protonix.  Has had EGD previously.  Sees Dr Markham Jordan.  Restart protonix.

## 2013-02-28 NOTE — Assessment & Plan Note (Signed)
On spiriva.  Breathing stable.  Follow.   

## 2013-02-28 NOTE — Assessment & Plan Note (Signed)
Discussed the need to quit.  She has cut down.  Plans to continue to cut down.  Follow.

## 2013-02-28 NOTE — Assessment & Plan Note (Signed)
Low cholesterol diet and exercise.  On simvastatin.  Check lipid panel and liver function.    

## 2013-02-28 NOTE — Telephone Encounter (Signed)
Refilled Carvedilol sent to [facility]. lmtcb pt needs to schedule future appointment.

## 2013-02-28 NOTE — Assessment & Plan Note (Signed)
Stable.  Follow.   

## 2013-02-28 NOTE — Assessment & Plan Note (Signed)
Currently stable.  Followed by Dr Mariah Milling.  Takes lasix prn.  Overdue f/u with cardiology.  Schedule appt.

## 2013-02-28 NOTE — Assessment & Plan Note (Signed)
Had colonoscopy previously.  Followed by Dr Markham Jordan.  Due f/u.  Discussed with her today regarding referral back.  She declines at this time.  Will notify me when agreeable.

## 2013-02-28 NOTE — Assessment & Plan Note (Signed)
Documented - previous echo.  Seeing Dr Gollan.   

## 2013-02-28 NOTE — Assessment & Plan Note (Signed)
Sees Dr Mariah Milling.  Schedule appt as outlined.  Currently stable.

## 2013-02-28 NOTE — Assessment & Plan Note (Signed)
Followed by Dr Pandit.  Now on oral iron.  Last hgb per her report - 12.  Has had GI w/up.   

## 2013-02-28 NOTE — Progress Notes (Signed)
Subjective:    Patient ID: Sandra Ellison, female    DOB: 1946/02/03, 67 y.o.   MRN: [id number]  HPI 67 year old female with past history of cardiomyopathy with EF 35%, CHF, GERD, anemia (followed by Dr Sherrlyn Hock), COPD and tobacco abuse.  She comes in today to follow up on these issues as well as to establish care.  Has recently been seeing Dr Alison Murray.  Also followed by Dr Mariah Milling and Dr Sherrlyn Hock.  Diagnosed with cardiomyopathy (CHF) a few years ago.  Takes lasix prn now.  Feels from a cardiac standpoint - stable.  Overdue follow up with Dr Mariah Milling.  Continues to smoke.  We discussed the need to quit today.  She has cut back.  Plans to continue to decrease.  Wants to quit on her own.  Some minimal cough currently.  She just started taking her spiriva regularly.  No tightness.  No increased sob.  Breathing at baseline.  She has been out of her protonix.  Some acid reflux.  States protonix usually controls symptoms.  Has had EGD previously.  Sees Dr Markham Jordan.  Has a history of colon polyps.  Due colonoscopy.  Bowels stable.  No blood.  Sees Dr Sherrlyn Hock every 3-4 months now.  Was receiving IV iron infusions.  Now on oral iron.  Last hgb per her report - 12.  Has some issues with bilateral knee and ankle pain.  Was on ultram.  Helped.  Ran out.  Desires no further w/up at this point. Has seen Dr Rosita Kea for her wrist.  Had surgery.  Has what sounds like some residual neuropathy.  Was previously on gabapentin.  Overall she feels things are stable.     Past Medical History  Diagnosis Date  . CHF (congestive heart failure)   . GERD (gastroesophageal reflux disease)   . COPD (chronic obstructive pulmonary disease)   . Hx of colonic polyps   . Iron deficiency anemia   . Chronic kidney disease (CKD), stage II (mild)   . Pulmonary HTN   . Arthritis   . Allergy   . History of blood transfusion   . Diverticulosis     H/O  . History of abnormal Pap smear     s/p vaginal hysterectomy    Outpatient Encounter  Prescriptions as of 02/27/2013  Medication Sig Dispense Refill  . albuterol (PROVENTIL HFA;VENTOLIN HFA) 108 (90 BASE) MCG/ACT inhaler Inhale 2 puffs into the lungs every 6 (six) hours as needed for wheezing.  1 Inhaler  2  . aspirin 81 MG tablet Take 81 mg by mouth daily.        . carvedilol (COREG) 12.5 MG tablet TAKE ONE TABLET BY MOUTH TWICE DAILY WITH MEALS  60 tablet  6  . ferrous fumarate (HEMOCYTE - 106 MG FE) 325 (106 FE) MG TABS tablet Take 1 tablet by mouth daily.      . furosemide (LASIX) 20 MG tablet Take 1 tablet (20 mg total) by mouth as needed.  30 tablet  1  . simvastatin (ZOCOR) 20 MG tablet Take 1 tablet (20 mg total) by mouth at bedtime.  90 tablet  4  . tiotropium (SPIRIVA HANDIHALER) 18 MCG inhalation capsule Place 1 capsule (18 mcg total) into inhaler and inhale daily.  30 capsule  3  . [DISCONTINUED] albuterol (PROVENTIL HFA;VENTOLIN HFA) 108 (90 BASE) MCG/ACT inhaler Inhale 2 puffs into the lungs every 6 (six) hours as needed for wheezing.  1 Inhaler  2  . [DISCONTINUED] furosemide (LASIX) 20  MG tablet Take 20 mg by mouth as needed.      . [DISCONTINUED] tiotropium (SPIRIVA HANDIHALER) 18 MCG inhalation capsule Place 1 capsule (18 mcg total) into inhaler and inhale daily.  30 capsule  12  . fluticasone (FLONASE) 50 MCG/ACT nasal spray Place 2 sprays into the nose daily.        . pantoprazole (PROTONIX) 40 MG tablet Take 40 mg by mouth daily.        . potassium chloride (K-DUR) 10 MEQ tablet Take 10 mEq by mouth as needed.      . [DISCONTINUED] cetirizine (ZYRTEC) 10 MG tablet Take 10 mg by mouth daily.        . [DISCONTINUED] lisinopril (PRINIVIL,ZESTRIL) 10 MG tablet Take 1 tablet (10 mg total) by mouth daily.  90 tablet  3   No facility-administered encounter medications on file as of 02/27/2013.    Review of Systems Patient denies any headache, lightheadedness or dizziness.  No sinus or allergy symptoms.  No chest pain, tightness or palpitations.  No increased  shortness of breath.  Minimal cough.  Breathing overall stable.   No nausea or vomiting.  No abdominal pain or cramping. Acid reflux off protonix.  protonix normally controls.   No bowel change, such as diarrhea, constipation, BRBPR or melana.  No urine change.   Bilateral knee and ankle pain as outlined.       Objective:   Physical Exam Filed Vitals:   02/27/13 1322  BP: 110/90  Pulse: 81  Temp: 98.1 F (36.7 C)   Blood pressure left arm 104/68 and right 62/26  67 year old female in no acute distress.   HEENT:  Nares- clear.  Oropharynx - without lesions. NECK:  Supple.  Nontender.  No audible bruit.  HEART:  Appears to be regular. LUNGS:  No crackles or wheezing audible.  Respirations even and unlabored.  RADIAL PULSE:  Appear to be equal bilaterally.    ABDOMEN:  Soft, nontender.  Bowel sounds present and normal.  No audible abdominal bruit.   EXTREMITIES:  No increased edema present.  DP pulses palpable and equal bilaterally.   MSK:  No increased swelling or tenderness to palpation over the knees.  Good rom.      Assessment & Plan:  VASCULAR.  Blood pressure lower in right arm compared to left.  States has been present for years.  Obtain/review records.  Follow.   HEALTH MAINTENANCE.  Schedule her for a physical next visit.  Discussed need for mammogram.  Discussed need for colonoscopy.  She wants to hold at this point.  Obtain records.    I spent 45 minutes with the patient and more than 50% of the time was spent in consultation regarding the above.

## 2013-03-07 ENCOUNTER — Encounter: Payer: Self-pay | Admitting: Internal Medicine

## 2013-03-07 DIAGNOSIS — Z860100 Personal history of colon polyps, unspecified: Secondary | ICD-10-CM

## 2013-03-07 DIAGNOSIS — S5400XA Injury of ulnar nerve at forearm level, unspecified arm, initial encounter: Secondary | ICD-10-CM | POA: Insufficient documentation

## 2013-03-07 DIAGNOSIS — K219 Gastro-esophageal reflux disease without esophagitis: Secondary | ICD-10-CM

## 2013-03-07 DIAGNOSIS — S5401XS Injury of ulnar nerve at forearm level, right arm, sequela: Secondary | ICD-10-CM

## 2013-03-08 ENCOUNTER — Encounter: Payer: Self-pay | Admitting: Cardiovascular Disease

## 2013-03-08 ENCOUNTER — Ambulatory Visit (INDEPENDENT_AMBULATORY_CARE_PROVIDER_SITE_OTHER): Payer: Medicare HMO | Admitting: Cardiovascular Disease

## 2013-03-08 VITALS — BP 104/82 | HR 77 | Ht 64.5 in | Wt 133.8 lb

## 2013-03-08 DIAGNOSIS — D649 Anemia, unspecified: Secondary | ICD-10-CM

## 2013-03-08 DIAGNOSIS — J449 Chronic obstructive pulmonary disease, unspecified: Secondary | ICD-10-CM

## 2013-03-08 DIAGNOSIS — I272 Pulmonary hypertension, unspecified: Secondary | ICD-10-CM

## 2013-03-08 DIAGNOSIS — I2510 Atherosclerotic heart disease of native coronary artery without angina pectoris: Secondary | ICD-10-CM

## 2013-03-08 DIAGNOSIS — I428 Other cardiomyopathies: Secondary | ICD-10-CM

## 2013-03-08 DIAGNOSIS — G4700 Insomnia, unspecified: Secondary | ICD-10-CM | POA: Insufficient documentation

## 2013-03-08 DIAGNOSIS — I2789 Other specified pulmonary heart diseases: Secondary | ICD-10-CM

## 2013-03-08 DIAGNOSIS — F17200 Nicotine dependence, unspecified, uncomplicated: Secondary | ICD-10-CM

## 2013-03-08 DIAGNOSIS — E785 Hyperlipidemia, unspecified: Secondary | ICD-10-CM

## 2013-03-08 DIAGNOSIS — M25569 Pain in unspecified knee: Secondary | ICD-10-CM

## 2013-03-08 DIAGNOSIS — R0602 Shortness of breath: Secondary | ICD-10-CM

## 2013-03-08 DIAGNOSIS — M25562 Pain in left knee: Secondary | ICD-10-CM | POA: Insufficient documentation

## 2013-03-08 MED ORDER — PANTOPRAZOLE SODIUM 40 MG PO TBEC: 40.0000 mg | DELAYED_RELEASE_TABLET | Freq: Every day | ORAL | Status: DC

## 2013-03-08 NOTE — Assessment & Plan Note (Signed)
No significant SOB. She will continue to use lasix as needed.

## 2013-03-08 NOTE — Assessment & Plan Note (Signed)
Stable, followed by Dr. Sherrlyn Hock

## 2013-03-08 NOTE — Progress Notes (Signed)
Patient ID: Sandra Ellison, female    DOB: November 18, 1945, 67 y.o.   MRN: [id number]  HPI Comments: 67 year old woman with a history of peptic ulcer disease, nonischemic cardiomyopathy with history of systolic and diastolic CHF, COPD with long smoking history who continues to smoke, pulmonary hypertension seen on echocardiogram in March 2012 who presented to [facility] with increasing shortness of breath on February 11, 2011, chronic renal insufficiency, stage II, severe iron deficiency anemia, She presents to the office for routine followup. She is still smoking several cigarettes per day  In followup today, she reports that she is doing well. She's not take Lasix as she denies any significant shortness of breath or edema. She reports not having to take Lasix for least one year. Continues to have problems with COPD and takes Spiriva. She smokes 2-3 cigarettes per day. She is trying to quit. She reports that her blood count is stable, hemoglobin 13. She does have problems with constipation and has been taking laxatives. Reports having significant left knee pain, also insomnia. She is requesting tramadol.  Previous hospital admission in 2012 she was found to have CHF, moderate bilateral pleural effusions with BNP of 26,000. Underlying anemia was a significant component of her presentation. Previously received procrit every other week, bone marrow biopsy, received packed red blood cells x2, iron transfusion x2,  followed by Dr. Sherrlyn Hock.  Echocardiogram in the hospital showed ejection fraction 30-35%, mild to moderate right ventricular systolic pressure estimated at 40-50 mmHg  Echocardiogram performed February 11, 2011 shows ejection fraction less than 25% with severe global hypokinesis, apical akinesis, normal right ventricular systolic function , mild to moderate mitral valve regurgitation, normal right ventricular systolic pressures  Stress test done in the hospital on February 13 2011 shows no significant  ischemia. She has a dilated cardiomyopathy with ejection fraction 27%, old scar in the anteroseptal wall, inferior and inferolateral wall. This was a pharmacologic study  EKG today shows normal sinus rhythm with rate of 78 beats per minute T-wave abnormality in V3 through V6, one and aVL, diffuse low voltage       Outpatient Encounter Prescriptions as of 03/08/2013  Medication Sig Dispense Refill  . albuterol (PROVENTIL HFA;VENTOLIN HFA) 108 (90 BASE) MCG/ACT inhaler Inhale 2 puffs into the lungs every 6 (six) hours as needed for wheezing.  1 Inhaler  2  . aspirin 81 MG tablet Take 81 mg by mouth daily.        . carvedilol (COREG) 12.5 MG tablet TAKE ONE TABLET BY MOUTH TWICE DAILY WITH MEALS  60 tablet  1  . ferrous fumarate (HEMOCYTE - 106 MG FE) 325 (106 FE) MG TABS tablet Take 2 tablets by mouth daily.       . furosemide (LASIX) 20 MG tablet Take 1 tablet (20 mg total) by mouth as needed.  30 tablet  1  . potassium chloride (K-DUR) 10 MEQ tablet Take 10 mEq by mouth as needed.      . simvastatin (ZOCOR) 20 MG tablet Take 1 tablet (20 mg total) by mouth at bedtime.  90 tablet  4  . tiotropium (SPIRIVA) 18 MCG inhalation capsule Place 18 mcg into inhaler and inhale as needed.      . [DISCONTINUED] tiotropium (SPIRIVA HANDIHALER) 18 MCG inhalation capsule Place 1 capsule (18 mcg total) into inhaler and inhale daily.  30 capsule  3  . pantoprazole (PROTONIX) 40 MG tablet Take 1 tablet (40 mg total) by mouth daily.  30 tablet  11  . [  DISCONTINUED] fluticasone (FLONASE) 50 MCG/ACT nasal spray Place 2 sprays into the nose daily.        . [DISCONTINUED] pantoprazole (PROTONIX) 40 MG tablet Take 40 mg by mouth daily.         No facility-administered encounter medications on file as of 03/08/2013.    Review of Systems  Constitutional: Negative.   HENT: Negative.   Eyes: Negative.   Respiratory: Negative.   Cardiovascular: Negative.   Gastrointestinal: Negative.   Endocrine: Negative.    Musculoskeletal: Positive for arthralgias.  Skin: Negative.   Allergic/Immunologic: Negative.   Neurological: Negative.   Hematological: Negative.   Psychiatric/Behavioral: Positive for sleep disturbance.  All other systems reviewed and are negative.    BP 104/82  Pulse 77  Ht 5' 4.5" (1.638 m)  Wt 133 lb 12 oz (60.669 kg)  BMI 22.61 kg/m2  Physical Exam  Nursing note and vitals reviewed. Constitutional: She is oriented to person, place, and time. She appears well-developed and well-nourished.  HENT:  Head: Normocephalic.  Nose: Nose normal.  Mouth/Throat: Oropharynx is clear and moist.  Eyes: Conjunctivae are normal. Pupils are equal, round, and reactive to light.  Neck: Normal range of motion. Neck supple. No JVD present. Carotid bruit is present.  Cardiovascular: Normal rate, regular rhythm, S1 normal, S2 normal, normal heart sounds and intact distal pulses.  Exam reveals no gallop and no friction rub.   No murmur heard. Pulses:      Carotid pulses are 1+ on the right side. Pulmonary/Chest: Effort normal. No respiratory distress. She has decreased breath sounds. She has no wheezes. She has no rales. She exhibits no tenderness.  Abdominal: Soft. Bowel sounds are normal. She exhibits no distension. There is no tenderness.  Musculoskeletal: Normal range of motion. She exhibits no edema and no tenderness.  Lymphadenopathy:    She has no cervical adenopathy.  Neurological: She is alert and oriented to person, place, and time. Coordination normal.  Skin: Skin is warm and dry. No rash noted. No erythema.  Psychiatric: She has a normal mood and affect. Her behavior is normal. Judgment and thought content normal.    Assessment and Plan

## 2013-03-08 NOTE — Assessment & Plan Note (Signed)
Doing well. No sigtniifcant SOB or edema. Not taking lasix. No changes to the meds at this time.

## 2013-03-08 NOTE — Assessment & Plan Note (Signed)
We have encouraged her to continue to work on weaning her cigarettes and smoking cessation. She will continue to work on this and does not want any assistance with chantix.  

## 2013-03-08 NOTE — Assessment & Plan Note (Signed)
Currently with no symptoms of angina. No further workup at this time. Continue current medication regimen. 

## 2013-03-08 NOTE — Assessment & Plan Note (Signed)
She reports having significant problems sleeping at nighttime. She is requesting something for sleep. We have suggested she talk with Dr. Lorin Picket for prescription of Ambien or other medication.

## 2013-03-08 NOTE — Assessment & Plan Note (Signed)
On spiriva 

## 2013-03-08 NOTE — Assessment & Plan Note (Signed)
Suggested she stay on simvastatin.

## 2013-03-08 NOTE — Assessment & Plan Note (Addendum)
She is requesting a prescription of tramadol. We have suggested she discuss this with Dr. Lorin Picket.

## 2013-03-08 NOTE — Patient Instructions (Addendum)
You are doing well. No medication changes were made.  Consider seeing Dr. Servando Snare for GI issues  Try aleve (naproxen) for knee pain  Continue to wean the smoking  Please call us if you have new issues that need to be addressed before your next appt.  Your physician wants you to follow-up in: 12 months.  You will receive a reminder letter in the mail two months in advance. If you don't receive a letter, please call our office to schedule the follow-up appointment.

## 2013-03-09 ENCOUNTER — Encounter: Payer: Self-pay | Admitting: Internal Medicine

## 2013-03-28 ENCOUNTER — Ambulatory Visit: Payer: Self-pay | Admitting: Internal Medicine

## 2013-03-28 LAB — IRON AND TIBC
Iron Bind.Cap.(Total): 356 ug/dL (ref 250–450)
Iron Saturation: 20 %
Iron: 71 ug/dL (ref 50–170)
Unbound Iron-Bind.Cap.: 285 ug/dL

## 2013-03-28 LAB — FERRITIN: Ferritin (ARMC): 98 ng/mL (ref 8–388)

## 2013-04-08 ENCOUNTER — Ambulatory Visit: Payer: Self-pay | Admitting: Internal Medicine

## 2013-04-13 ENCOUNTER — Other Ambulatory Visit: Payer: Self-pay

## 2013-04-23 ENCOUNTER — Emergency Department: Payer: Self-pay | Admitting: Internal Medicine

## 2013-04-23 LAB — COMPREHENSIVE METABOLIC PANEL
Albumin: 3.3 g/dL — ABNORMAL LOW (ref 3.4–5.0)
Anion Gap: 5 — ABNORMAL LOW (ref 7–16)
BUN: 16 mg/dL (ref 7–18)
Chloride: 107 mmol/L (ref 98–107)
Creatinine: 1.54 mg/dL — ABNORMAL HIGH (ref 0.60–1.30)
EGFR (African American): 40 — ABNORMAL LOW
Potassium: 4.4 mmol/L (ref 3.5–5.1)
SGPT (ALT): 14 U/L (ref 12–78)
Total Protein: 6.7 g/dL (ref 6.4–8.2)

## 2013-04-23 LAB — URINALYSIS, COMPLETE
Bilirubin,UR: NEGATIVE
Glucose,UR: NEGATIVE mg/dL (ref 0–75)
Hyaline Cast: 15
Ketone: NEGATIVE
Leukocyte Esterase: NEGATIVE
RBC,UR: 4 /HPF (ref 0–5)
Specific Gravity: 1.011 (ref 1.003–1.030)
WBC UR: 4 /HPF (ref 0–5)

## 2013-04-23 LAB — CBC
HCT: 41.5 % (ref 35.0–47.0)
MCH: 30.6 pg (ref 26.0–34.0)
RDW: 14.6 % — ABNORMAL HIGH (ref 11.5–14.5)

## 2013-04-23 LAB — CK TOTAL AND CKMB (NOT AT ARMC)
CK, Total: 26 U/L (ref 21–215)
CK-MB: <0.5 ng/mL — ABNORMAL LOW (ref 0.5–3.6)

## 2013-04-29 ENCOUNTER — Other Ambulatory Visit: Payer: Self-pay | Admitting: Cardiovascular Disease

## 2013-05-01 ENCOUNTER — Other Ambulatory Visit: Payer: Self-pay

## 2013-05-01 ENCOUNTER — Other Ambulatory Visit: Payer: Self-pay | Admitting: *Deleted

## 2013-05-01 MED ORDER — CARVEDILOL 12.5 MG PO TABS: ORAL_TABLET | ORAL | Status: DC

## 2013-05-01 NOTE — Telephone Encounter (Signed)
Requested Prescriptions   Signed Prescriptions Disp Refills  . carvedilol (COREG) 12.5 MG tablet 60 tablet 3    Sig: TAKE ONE TABLET BY MOUTH TWICE DAILY WITH MEALS    Authorizing Provider: GOLLAN, TIMOTHY J    Ordering User: Dajon Lazar C    

## 2013-05-01 NOTE — Telephone Encounter (Signed)
Needs refill

## 2013-05-01 NOTE — Telephone Encounter (Signed)
Requested Prescriptions   Signed Prescriptions Disp Refills  . carvedilol (COREG) 12.5 MG tablet 60 tablet 3    Sig: TAKE ONE TABLET BY MOUTH TWICE DAILY WITH MEALS    Authorizing Provider: Antonieta Iba    Ordering User: Kendrick Fries

## 2013-05-03 ENCOUNTER — Other Ambulatory Visit: Payer: Self-pay

## 2013-05-03 MED ORDER — CARVEDILOL 12.5 MG PO TABS: ORAL_TABLET | ORAL | Status: DC

## 2013-05-03 NOTE — Telephone Encounter (Signed)
Refill sent for carvedilol.  

## 2013-05-16 ENCOUNTER — Encounter: Payer: Self-pay | Admitting: Internal Medicine

## 2013-05-16 ENCOUNTER — Ambulatory Visit (INDEPENDENT_AMBULATORY_CARE_PROVIDER_SITE_OTHER): Payer: Medicare HMO | Admitting: Internal Medicine

## 2013-05-16 VITALS — BP 110/90 | HR 73 | Temp 97.7°F | Ht 65.0 in | Wt 137.8 lb

## 2013-05-16 DIAGNOSIS — R319 Hematuria, unspecified: Secondary | ICD-10-CM

## 2013-05-16 DIAGNOSIS — Z1239 Encounter for other screening for malignant neoplasm of breast: Secondary | ICD-10-CM

## 2013-05-16 DIAGNOSIS — E785 Hyperlipidemia, unspecified: Secondary | ICD-10-CM

## 2013-05-16 DIAGNOSIS — J449 Chronic obstructive pulmonary disease, unspecified: Secondary | ICD-10-CM

## 2013-05-16 DIAGNOSIS — S4490XS Injury of unspecified nerve at shoulder and upper arm level, unspecified arm, sequela: Secondary | ICD-10-CM

## 2013-05-16 DIAGNOSIS — Z23 Encounter for immunization: Secondary | ICD-10-CM

## 2013-05-16 DIAGNOSIS — Z86010 Personal history of colonic polyps: Secondary | ICD-10-CM

## 2013-05-16 DIAGNOSIS — I428 Other cardiomyopathies: Secondary | ICD-10-CM

## 2013-05-16 DIAGNOSIS — S5401XS Injury of ulnar nerve at forearm level, right arm, sequela: Secondary | ICD-10-CM

## 2013-05-16 DIAGNOSIS — K5730 Diverticulosis of large intestine without perforation or abscess without bleeding: Secondary | ICD-10-CM

## 2013-05-16 DIAGNOSIS — I2789 Other specified pulmonary heart diseases: Secondary | ICD-10-CM

## 2013-05-16 DIAGNOSIS — F17200 Nicotine dependence, unspecified, uncomplicated: Secondary | ICD-10-CM

## 2013-05-16 DIAGNOSIS — K5790 Diverticulosis of intestine, part unspecified, without perforation or abscess without bleeding: Secondary | ICD-10-CM

## 2013-05-16 DIAGNOSIS — R911 Solitary pulmonary nodule: Secondary | ICD-10-CM

## 2013-05-16 DIAGNOSIS — K219 Gastro-esophageal reflux disease without esophagitis: Secondary | ICD-10-CM

## 2013-05-16 DIAGNOSIS — I272 Pulmonary hypertension, unspecified: Secondary | ICD-10-CM

## 2013-05-16 DIAGNOSIS — D649 Anemia, unspecified: Secondary | ICD-10-CM

## 2013-05-16 DIAGNOSIS — I2510 Atherosclerotic heart disease of native coronary artery without angina pectoris: Secondary | ICD-10-CM

## 2013-05-16 MED ORDER — GABAPENTIN 300 MG PO CAPS: 300.0000 mg | ORAL_CAPSULE | Freq: Every day | ORAL | Status: DC

## 2013-05-16 NOTE — Progress Notes (Signed)
Subjective:    Patient ID: Sandra Ellison, female    DOB: 25-Jun-1945, 67 y.o.   MRN: [id number]  HPI 67 year old female with past history of cardiomyopathy with EF 35%, CHF, GERD, anemia (followed by Dr Sherrlyn Hock), COPD and tobacco abuse.  She comes in today to follow up on these issues as well as for a complete physical exam.   Followed by Dr Mariah Milling and Dr Sherrlyn Hock.  Diagnosed with cardiomyopathy (CHF) a few years ago.  Takes lasix prn now.  Feels from a cardiac standpoint - stable.  Continues to smoke.  We again discussed the need to quit.   She has cut back.  Plans to continue to decrease.  Wants to quit on her own.  No increased sob.  Breathing at baseline.  No acid reflux reported.  On protonix.  Has had EGD previously.  Sees Dr Markham Jordan.  Has a history of colon polyps.  Due colonoscopy.  Bowels stable.  No blood.  Sees Dr Sherrlyn Hock every 3-4 months now.  Receiving IV iron infusions.   Has seen Dr Rosita Kea for her wrist.  Had surgery.  Has what sounds like some residual neuropathy.  Was previously on gabapentin.  Helped.  She would like to restart.  States she is noticing some right shoulder discomfort.  Increased over the last two weeks.  Has tried massage and hot shower.  Helped.  Is worse at night.  Has noticed some right hand stiffness.  Aches up the arm.  She did state she was seen in the ER 04/30/13.  Was evaluated for dizziness.  Had labs and CT scan.  States Ct revealed pulmonary nodule.  States she has done well since her ER visit and feels she is doing relatively well.     Past Medical History  Diagnosis Date  . CHF (congestive heart failure)   . GERD (gastroesophageal reflux disease)   . COPD (chronic obstructive pulmonary disease)   . Hx of colonic polyps   . Iron deficiency anemia   . Chronic kidney disease (CKD), stage II (mild)   . Pulmonary HTN   . Arthritis   . Allergy   . History of blood transfusion   . Diverticulosis     H/O  . History of abnormal Pap smear     s/p vaginal  hysterectomy    Outpatient Encounter Prescriptions as of 05/16/2013  Medication Sig  . albuterol (PROVENTIL HFA;VENTOLIN HFA) 108 (90 BASE) MCG/ACT inhaler Inhale 2 puffs into the lungs every 6 (six) hours as needed for wheezing.  Marland Kitchen aspirin 81 MG tablet Take 81 mg by mouth daily.    . ferrous fumarate (HEMOCYTE - 106 MG FE) 325 (106 FE) MG TABS tablet Take 2 tablets by mouth daily.   . furosemide (LASIX) 20 MG tablet Take 1 tablet (20 mg total) by mouth as needed.  . pantoprazole (PROTONIX) 40 MG tablet Take 1 tablet (40 mg total) by mouth daily.  . simvastatin (ZOCOR) 20 MG tablet Take 1 tablet (20 mg total) by mouth at bedtime.  Marland Kitchen tiotropium (SPIRIVA) 18 MCG inhalation capsule Place 18 mcg into inhaler and inhale as needed.  . potassium chloride (K-DUR) 10 MEQ tablet Take 10 mEq by mouth as needed.  . [DISCONTINUED] carvedilol (COREG) 12.5 MG tablet TAKE ONE TABLET BY MOUTH TWICE DAILY WITH MEALS    Review of Systems Patient denies any headache, lightheadedness or dizziness.  No sinus or allergy symptoms.  No chest pain, tightness or palpitations.  No increased shortness  of breath.  No cough or congestion.  Breathing overall stable.   No nausea or vomiting.  No abdominal pain or cramping. Acid controlled on protonix.   No bowel change, such as diarrhea, constipation, BRBPR or melana.  No urine change.  Shoulder pain and wrist pain as outlined.  Felt gabapentin helped.  Wants to restart.        Objective:   Physical Exam  Filed Vitals:   05/16/13 1323  BP: 110/90  Pulse: 73  Temp: 97.7 F (71.76 C)   67 year old female in no acute distress.   HEENT:  Nares- clear.  Oropharynx - without lesions. NECK:  Supple.  Nontender.  No audible bruit.  HEART:  Appears to be regular. LUNGS:  No crackles or wheezing audible.  Respirations even and unlabored.  RADIAL PULSE:  Equal bilaterally.    BREASTS:  No nipple discharge or nipple retraction present.  Could not appreciate any distinct  nodules or axillary adenopathy.  ABDOMEN:  Soft, nontender.  Bowel sounds present and normal.  No audible abdominal bruit.  GU:  Not performed.     EXTREMITIES:  No increased edema present.  DP pulses palpable and equal bilaterally.          Assessment & Plan:  VASCULAR.  Blood pressure lower in right arm compared to left.  States has been present for years.    HEALTH MAINTENANCE.  Physical today.  Discussed need for mammogram.  Discussed need for colonoscopy.  She wants to hold at this point.

## 2013-05-16 NOTE — Progress Notes (Signed)
Pre-visit discussion using our clinic review tool. No additional management support is needed unless otherwise documented below in the visit note.  

## 2013-05-20 ENCOUNTER — Encounter: Payer: Self-pay | Admitting: Internal Medicine

## 2013-05-20 DIAGNOSIS — R319 Hematuria, unspecified: Secondary | ICD-10-CM | POA: Insufficient documentation

## 2013-05-20 DIAGNOSIS — R911 Solitary pulmonary nodule: Secondary | ICD-10-CM | POA: Insufficient documentation

## 2013-05-20 NOTE — Assessment & Plan Note (Signed)
Currently stable.  Followed by Dr Gollan.  Takes lasix prn.  Followed by cardiology.   

## 2013-05-20 NOTE — Assessment & Plan Note (Signed)
Usually controlled on protonix.  Has had EGD previously.  Sees Dr Markham Jordan. On protonix and doing well.  Follow.

## 2013-05-20 NOTE — Assessment & Plan Note (Signed)
Low cholesterol diet and exercise.  On simvastatin.  Follow lipid panel and liver function.   

## 2013-05-20 NOTE — Assessment & Plan Note (Signed)
Pt informed me of ER visit today.  Records obtained after pts visit and urinalysis revealed rbc's.  Will need f/u urinalysis.    

## 2013-05-20 NOTE — Assessment & Plan Note (Signed)
Was followed by Dr Rosita Kea.  Had NCS - Dr Chestine Spore.  Was on gabapentin.  Helped.  Request to restart.  Will start gabapentin.  Follow.

## 2013-05-20 NOTE — Assessment & Plan Note (Signed)
Had colonoscopy previously.  Followed by Dr Markham Jordan.  Due f/u.  Discussed with her today regarding referral back.  She declines at this time.  Will notify me when agreeable.

## 2013-05-20 NOTE — Assessment & Plan Note (Signed)
Have discussed the need to quit.  She has cut down.  Plans to continue to cut down.  Follow.   

## 2013-05-20 NOTE — Assessment & Plan Note (Signed)
Documented - previous echo.  Seeing Dr Gollan.   

## 2013-05-20 NOTE — Assessment & Plan Note (Signed)
Stable.  Follow.   

## 2013-05-20 NOTE — Assessment & Plan Note (Signed)
Followed by Dr Pandit.  Now on oral iron.  Last hgb per her report - 12.  Has had GI w/up.   

## 2013-05-20 NOTE — Assessment & Plan Note (Signed)
Sees Dr Mariah Milling.  Schedule appt as outlined.  Currently stable.

## 2013-05-20 NOTE — Assessment & Plan Note (Signed)
On spiriva.  Breathing stable.  Follow.   

## 2013-05-20 NOTE — Assessment & Plan Note (Signed)
CT 04/23/13 - new pulmonary nodule and stable (previous) pulmonary nodule.  See CT for report.  Will need further w/up and follow up.  Plan follow up CT in 6 months.

## 2013-05-23 ENCOUNTER — Encounter: Payer: Self-pay | Admitting: Emergency Medicine

## 2013-06-04 ENCOUNTER — Encounter: Payer: Self-pay | Admitting: Internal Medicine

## 2013-06-04 DIAGNOSIS — D649 Anemia, unspecified: Secondary | ICD-10-CM

## 2013-07-17 ENCOUNTER — Ambulatory Visit (INDEPENDENT_AMBULATORY_CARE_PROVIDER_SITE_OTHER): Payer: Medicare HMO | Admitting: Internal Medicine

## 2013-07-17 ENCOUNTER — Encounter: Payer: Self-pay | Admitting: Internal Medicine

## 2013-07-17 ENCOUNTER — Ambulatory Visit: Payer: Self-pay | Admitting: Internal Medicine

## 2013-07-17 VITALS — BP 110/70 | HR 65 | Temp 97.8°F | Ht 65.0 in | Wt 134.0 lb

## 2013-07-17 DIAGNOSIS — I2510 Atherosclerotic heart disease of native coronary artery without angina pectoris: Secondary | ICD-10-CM

## 2013-07-17 DIAGNOSIS — J449 Chronic obstructive pulmonary disease, unspecified: Secondary | ICD-10-CM

## 2013-07-17 DIAGNOSIS — K219 Gastro-esophageal reflux disease without esophagitis: Secondary | ICD-10-CM

## 2013-07-17 DIAGNOSIS — R911 Solitary pulmonary nodule: Secondary | ICD-10-CM

## 2013-07-17 DIAGNOSIS — M25561 Pain in right knee: Secondary | ICD-10-CM

## 2013-07-17 DIAGNOSIS — I428 Other cardiomyopathies: Secondary | ICD-10-CM

## 2013-07-17 DIAGNOSIS — R319 Hematuria, unspecified: Secondary | ICD-10-CM

## 2013-07-17 DIAGNOSIS — Z23 Encounter for immunization: Secondary | ICD-10-CM

## 2013-07-17 DIAGNOSIS — Z86010 Personal history of colonic polyps: Secondary | ICD-10-CM

## 2013-07-17 DIAGNOSIS — D649 Anemia, unspecified: Secondary | ICD-10-CM

## 2013-07-17 DIAGNOSIS — M25569 Pain in unspecified knee: Secondary | ICD-10-CM

## 2013-07-17 DIAGNOSIS — E785 Hyperlipidemia, unspecified: Secondary | ICD-10-CM

## 2013-07-17 DIAGNOSIS — I2789 Other specified pulmonary heart diseases: Secondary | ICD-10-CM

## 2013-07-17 DIAGNOSIS — M25562 Pain in left knee: Secondary | ICD-10-CM

## 2013-07-17 DIAGNOSIS — S5400XA Injury of ulnar nerve at forearm level, unspecified arm, initial encounter: Secondary | ICD-10-CM

## 2013-07-17 DIAGNOSIS — I272 Pulmonary hypertension, unspecified: Secondary | ICD-10-CM

## 2013-07-17 DIAGNOSIS — F17200 Nicotine dependence, unspecified, uncomplicated: Secondary | ICD-10-CM

## 2013-07-17 MED ORDER — GABAPENTIN 300 MG PO CAPS: 300.0000 mg | ORAL_CAPSULE | Freq: Three times a day (TID) | ORAL | Status: DC | PRN

## 2013-07-17 MED ORDER — ALBUTEROL SULFATE HFA 108 (90 BASE) MCG/ACT IN AERS: 2.0000 | INHALATION_SPRAY | Freq: Four times a day (QID) | RESPIRATORY_TRACT | Status: DC | PRN

## 2013-07-17 NOTE — Assessment & Plan Note (Addendum)
CT 04/23/13 - new pulmonary nodule and stable (previous) pulmonary nodule.  See CT for report.  Will need further w/up and follow up.  Plan follow up CT in 6 months after last.

## 2013-07-17 NOTE — Progress Notes (Signed)
Subjective:    Patient ID: Sandra Ellison, female    DOB: 1945/09/20, 68 y.o.   MRN: [id number]  HPI 68 year old female with past history of cardiomyopathy with EF 35%, CHF, GERD, anemia (followed by Dr Ma Hillock), COPD and tobacco abuse.  She comes in today for a scheduled follow up.   Followed by Dr Rockey Situ and Dr Ma Hillock.  Diagnosed with cardiomyopathy (CHF) a few years ago.  Takes lasix prn now.  Feels from a cardiac standpoint - stable.  Continues to smoke.  We again discussed the need to quit.   She has cut back.  Plans to continue to decrease.  Wants to quit on her own.  No increased sob.  Breathing at baseline.  No acid reflux reported.  On protonix.  Has had EGD previously.  Sees Dr Tiffany Kocher.  Has a history of colon polyps.  Bowels stable.  No blood.  Sees Dr Ma Hillock every 3-4 months now.  Receiving IV iron infusions.   Has seen Dr Rudene Christians for her wrist.  Had surgery.  Has what sounds like some residual neuropathy.  She reports some increased burning in her right shoulder (posterior) and upper arm.  Extends from mid to upper arm.  Was previously on tid dosing of neurontin.  Tolerated.  She also reports aching in both knees and ankles.  Described as a dull ache.  Better if moving.  Worse at night.     Past Medical History  Diagnosis Date  . CHF (congestive heart failure)   . GERD (gastroesophageal reflux disease)   . COPD (chronic obstructive pulmonary disease)   . Hx of colonic polyps   . Iron deficiency anemia   . Chronic kidney disease (CKD), stage II (mild)   . Pulmonary HTN   . Arthritis   . Allergy   . History of blood transfusion   . Diverticulosis     H/O  . History of abnormal Pap smear     s/p vaginal hysterectomy    Outpatient Encounter Prescriptions as of 07/17/2013  Medication Sig  . albuterol (PROVENTIL HFA;VENTOLIN HFA) 108 (90 BASE) MCG/ACT inhaler Inhale 2 puffs into the lungs every 6 (six) hours as needed for wheezing.  Marland Kitchen aspirin 81 MG tablet Take 81 mg by mouth daily.    .  ferrous fumarate (HEMOCYTE - 106 MG FE) 325 (106 FE) MG TABS tablet Take 2 tablets by mouth daily.   Marland Kitchen gabapentin (NEURONTIN) 300 MG capsule Take 1 capsule (300 mg total) by mouth at bedtime.  . pantoprazole (PROTONIX) 40 MG tablet Take 1 tablet (40 mg total) by mouth daily.  . simvastatin (ZOCOR) 20 MG tablet Take 1 tablet (20 mg total) by mouth at bedtime.  Marland Kitchen tiotropium (SPIRIVA) 18 MCG inhalation capsule Place 18 mcg into inhaler and inhale as needed.  . furosemide (LASIX) 20 MG tablet Take 1 tablet (20 mg total) by mouth as needed.  . [DISCONTINUED] potassium chloride (K-DUR) 10 MEQ tablet Take 10 mEq by mouth as needed.    Review of Systems Patient denies any headache, lightheadedness or dizziness.  No sinus or allergy symptoms.  No chest pain, tightness or palpitations.  No increased shortness of breath.  No cough or congestion.  Breathing overall stable.   No nausea or vomiting.  No abdominal pain or cramping. Acid controlled on protonix.   No bowel change, such as diarrhea, constipation, BRBPR or melana.  No urine change.  Shoulder pain and arm pain as outlined.  Objective:   Physical Exam  Filed Vitals:   07/17/13 0952  BP: 110/70  Pulse: 65  Temp: 97.8 F (52.41 C)   68 year old female in no acute distress.   HEENT:  Nares- clear.  Oropharynx - without lesions. NECK:  Supple.  Nontender.  No audible bruit.  HEART:  Appears to be regular. LUNGS:  No crackles or wheezing audible.  Respirations even and unlabored.  RADIAL PULSE:  Equal bilaterally.  ABDOMEN:  Soft, nontender.  Bowel sounds present and normal.  No audible abdominal bruit.    EXTREMITIES:  No increased edema present.  DP pulses palpable and equal bilaterally.  MSK:  Increased pain posterior shoulder.  No pain to palpation upper arm.           Assessment & Plan:  VASCULAR.  Blood pressure lower in right arm compared to left.  States has been present for years.    HEALTH MAINTENANCE.  Physical 05/16/13.   Discussed need for mammogram.  Discussed need for colonoscopy.  She wants to hold at this point.  Prevnar given today.

## 2013-07-17 NOTE — Patient Instructions (Signed)
Tylenol extra strength - (1-2 tablets) twice a day as needed.

## 2013-07-17 NOTE — Progress Notes (Signed)
Pre-visit discussion using our clinic review tool. No additional management support is needed unless otherwise documented below in the visit note.  

## 2013-07-18 LAB — CBC CANCER CENTER
Basophil #: 0.1 x10 3/mm (ref 0.0–0.1)
Basophil %: 0.9 %
Eosinophil #: 0.1 x10 3/mm (ref 0.0–0.7)
Eosinophil %: 1.4 %
HCT: 41.4 % (ref 35.0–47.0)
HGB: 13.5 g/dL (ref 12.0–16.0)
LYMPHS PCT: 15.7 %
Lymphocyte #: 0.9 x10 3/mm — ABNORMAL LOW (ref 1.0–3.6)
MCH: 30 pg (ref 26.0–34.0)
MCHC: 32.6 g/dL (ref 32.0–36.0)
MCV: 92 fL (ref 80–100)
Monocyte #: 0.5 x10 3/mm (ref 0.2–0.9)
Monocyte %: 8.4 %
NEUTROS ABS: 4.3 x10 3/mm (ref 1.4–6.5)
Neutrophil %: 73.6 %
Platelet: 196 x10 3/mm (ref 150–440)
RBC: 4.49 10*6/uL (ref 3.80–5.20)
RDW: 14.8 % — AB (ref 11.5–14.5)
WBC: 5.8 x10 3/mm (ref 3.6–11.0)

## 2013-07-18 LAB — FERRITIN: FERRITIN (ARMC): 99 ng/mL (ref 8–388)

## 2013-07-18 LAB — IRON AND TIBC
IRON: 72 ug/dL (ref 50–170)
Iron Bind.Cap.(Total): 310 ug/dL (ref 250–450)
Iron Saturation: 23 %
Unbound Iron-Bind.Cap.: 238 ug/dL

## 2013-07-22 ENCOUNTER — Encounter: Payer: Self-pay | Admitting: Internal Medicine

## 2013-07-22 DIAGNOSIS — M25561 Pain in right knee: Secondary | ICD-10-CM | POA: Insufficient documentation

## 2013-07-22 DIAGNOSIS — M25562 Pain in left knee: Secondary | ICD-10-CM

## 2013-07-22 NOTE — Assessment & Plan Note (Signed)
Documented - previous echo.  Seeing Dr Gollan.   

## 2013-07-22 NOTE — Assessment & Plan Note (Signed)
On spiriva.  Breathing stable.  Follow.   

## 2013-07-22 NOTE — Assessment & Plan Note (Signed)
Currently stable.  Followed by Dr Gollan.  Takes lasix prn.  Followed by cardiology.   

## 2013-07-22 NOTE — Assessment & Plan Note (Signed)
Has had EGD previously.  Sees Dr Elliot. On protonix and doing well.  Follow.  

## 2013-07-22 NOTE — Assessment & Plan Note (Signed)
Sees Dr Gollan.  Currently stable.   

## 2013-07-22 NOTE — Assessment & Plan Note (Signed)
Pain as outlined. Question  If arthritis contributing.  Tylenol ES as outlined.  Follow.

## 2013-07-22 NOTE — Assessment & Plan Note (Signed)
Low cholesterol diet and exercise.  On simvastatin.  Follow lipid panel and liver function.   

## 2013-07-22 NOTE — Assessment & Plan Note (Signed)
Pt informed me of ER visit today.  Records obtained after pts visit and urinalysis revealed rbc's.  Will need f/u urinalysis.

## 2013-07-22 NOTE — Assessment & Plan Note (Signed)
Had colonoscopy previously.  Followed by Dr Tiffany Kocher.  Due f/u.  She declines at this time.  Will notify me when agreeable.

## 2013-07-22 NOTE — Assessment & Plan Note (Signed)
Have discussed the need to quit.  She has cut down.  Plans to continue to cut down.  Follow.   

## 2013-07-22 NOTE — Assessment & Plan Note (Addendum)
Was followed by Dr Rudene Christians.  Had NCS - Dr Carlis Abbott.  Was on gabapentin.  Now having burning in her right shoulder and mid to upper arm.  Will increase neurontin to 300mg  tid.  Tolerated this dose previously.  Follow.

## 2013-07-22 NOTE — Assessment & Plan Note (Signed)
Followed by Dr Ma Hillock.  Now on oral iron.  Last hgb per her report - 12.  Has had GI w/up.

## 2013-07-31 ENCOUNTER — Other Ambulatory Visit (INDEPENDENT_AMBULATORY_CARE_PROVIDER_SITE_OTHER): Payer: Commercial Managed Care - HMO

## 2013-07-31 DIAGNOSIS — D649 Anemia, unspecified: Secondary | ICD-10-CM

## 2013-07-31 DIAGNOSIS — E785 Hyperlipidemia, unspecified: Secondary | ICD-10-CM

## 2013-07-31 DIAGNOSIS — I428 Other cardiomyopathies: Secondary | ICD-10-CM

## 2013-07-31 DIAGNOSIS — R319 Hematuria, unspecified: Secondary | ICD-10-CM

## 2013-07-31 LAB — CBC WITH DIFFERENTIAL/PLATELET
BASOS PCT: 0.1 % (ref 0.0–3.0)
Basophils Absolute: 0 10*3/uL (ref 0.0–0.1)
EOS ABS: 0.1 10*3/uL (ref 0.0–0.7)
Eosinophils Relative: 0.8 % (ref 0.0–5.0)
HEMATOCRIT: 39.9 % (ref 36.0–46.0)
Hemoglobin: 13.2 g/dL (ref 12.0–15.0)
Lymphocytes Relative: 6.4 % — ABNORMAL LOW (ref 12.0–46.0)
Lymphs Abs: 0.6 10*3/uL — ABNORMAL LOW (ref 0.7–4.0)
MCHC: 33.1 g/dL (ref 30.0–36.0)
MCV: 93.6 fl (ref 78.0–100.0)
MONO ABS: 0.9 10*3/uL (ref 0.1–1.0)
Monocytes Relative: 9.6 % (ref 3.0–12.0)
NEUTROS PCT: 83.1 % — AB (ref 43.0–77.0)
Neutro Abs: 7.4 10*3/uL (ref 1.4–7.7)
PLATELETS: 225 10*3/uL (ref 150.0–400.0)
RBC: 4.26 Mil/uL (ref 3.87–5.11)
RDW: 15.7 % — ABNORMAL HIGH (ref 11.5–14.6)
WBC: 8.9 10*3/uL (ref 4.5–10.5)

## 2013-07-31 LAB — COMPREHENSIVE METABOLIC PANEL
ALK PHOS: 77 U/L (ref 39–117)
ALT: 7 U/L (ref 0–35)
AST: 11 U/L (ref 0–37)
Albumin: 3.2 g/dL — ABNORMAL LOW (ref 3.5–5.2)
BUN: 15 mg/dL (ref 6–23)
CO2: 25 mEq/L (ref 19–32)
Calcium: 8.8 mg/dL (ref 8.4–10.5)
Chloride: 101 mEq/L (ref 96–112)
Creatinine, Ser: 1.4 mg/dL — ABNORMAL HIGH (ref 0.4–1.2)
GFR: 41.54 mL/min — ABNORMAL LOW (ref >60.00)
Glucose, Bld: 91 mg/dL (ref 70–99)
POTASSIUM: 4.5 meq/L (ref 3.5–5.1)
Sodium: 134 mEq/L — ABNORMAL LOW (ref 135–145)
Total Bilirubin: 1.3 mg/dL — ABNORMAL HIGH (ref 0.3–1.2)
Total Protein: 6.6 g/dL (ref 6.0–8.3)

## 2013-07-31 LAB — LIPID PANEL
Cholesterol: 95 mg/dL (ref 0–200)
HDL: 25.3 mg/dL — AB (ref >39.00)
LDL CALC: 51 mg/dL (ref 0–99)
TRIGLYCERIDES: 94 mg/dL (ref 0.0–149.0)
Total CHOL/HDL Ratio: 4
VLDL: 18.8 mg/dL (ref 0.0–40.0)

## 2013-08-01 LAB — TSH: TSH: 3.53 u[IU]/mL (ref 0.35–5.50)

## 2013-08-02 LAB — URINALYSIS, ROUTINE W REFLEX MICROSCOPIC
BILIRUBIN URINE: NEGATIVE
HGB URINE DIPSTICK: NEGATIVE
KETONES UR: NEGATIVE
Leukocytes, UA: NEGATIVE
Nitrite: POSITIVE — AB
Specific Gravity, Urine: <=1.005 — AB (ref 1.000–1.030)
TOTAL PROTEIN, URINE-UPE24: NEGATIVE
URINE GLUCOSE: NEGATIVE
UROBILINOGEN UA: 0.2 (ref 0.0–1.0)
pH: 6 (ref 5.0–8.0)

## 2013-08-03 ENCOUNTER — Other Ambulatory Visit: Payer: Self-pay | Admitting: Internal Medicine

## 2013-08-03 DIAGNOSIS — E871 Hypo-osmolality and hyponatremia: Secondary | ICD-10-CM

## 2013-08-03 NOTE — Progress Notes (Signed)
Orders placed for f/u labs.  

## 2013-08-06 ENCOUNTER — Ambulatory Visit: Payer: Self-pay | Admitting: Internal Medicine

## 2013-08-07 ENCOUNTER — Telehealth: Payer: Self-pay | Admitting: *Deleted

## 2013-08-07 MED ORDER — SIMVASTATIN 10 MG PO TABS: 10.0000 mg | ORAL_TABLET | Freq: Every day | ORAL | Status: DC

## 2013-08-07 NOTE — Telephone Encounter (Signed)
Message copied by Cecelia Byars on Mon Aug 07, 2013 11:11 AM ------      Message from: Alisa Graff      Created: Thu Aug 03, 2013  6:46 PM       Pt has my chart but please call her about her labs.  Several issues.  Confirm she is currently on simvastatin 20mg  q day.  If so, then have her decrease to 10mg  q day.  Liver panel wnl.  Kidney function better.  hgb is normal.  (Dr Ma Hillock is following her cbc - stable).  Sodium slightly decreased.  Will follow.   Confirm with pt no urinary symptoms c/w uti.  If any symptoms, will need repeat urine with culture and let me know.  If no symptoms, urine ok.  Needs a f/u non fasting lab in 10-14 days. ------

## 2013-08-17 ENCOUNTER — Other Ambulatory Visit (INDEPENDENT_AMBULATORY_CARE_PROVIDER_SITE_OTHER): Payer: Commercial Managed Care - HMO

## 2013-08-17 DIAGNOSIS — E871 Hypo-osmolality and hyponatremia: Secondary | ICD-10-CM

## 2013-08-17 DIAGNOSIS — R17 Unspecified jaundice: Secondary | ICD-10-CM

## 2013-08-17 LAB — HEPATIC FUNCTION PANEL
ALBUMIN: 3.4 g/dL — AB (ref 3.5–5.2)
ALK PHOS: 83 U/L (ref 39–117)
ALT: 14 U/L (ref 0–35)
AST: 19 U/L (ref 0–37)
BILIRUBIN DIRECT: 0.2 mg/dL (ref 0.0–0.3)
Total Bilirubin: 0.9 mg/dL (ref 0.3–1.2)
Total Protein: 6.5 g/dL (ref 6.0–8.3)

## 2013-08-17 LAB — SODIUM: SODIUM: 138 meq/L (ref 135–145)

## 2013-08-18 ENCOUNTER — Encounter: Payer: Self-pay | Admitting: Internal Medicine

## 2013-09-15 ENCOUNTER — Telehealth: Payer: Self-pay | Admitting: Internal Medicine

## 2013-09-15 NOTE — Telephone Encounter (Signed)
Patient Information:  Caller Name: Rhea  Phone: [phone]  Patient: Sandra Ellison  Gender: Female  DOB: 06/28/1945  Age: 68 Years  PCP: Einar Pheasant  Office Follow Up:  Does the office need to follow up with this patient?: No  Instructions For The Office: N/A  RN Note:  Pt states she will call on Monday  to schedule appt if the lump is still there or call back sooner if it changes.   Symptoms  Reason For Call & Symptoms: Pt reports she has lump left foot above the ankle.  Reviewed Health History In EMR: Yes  Reviewed Medications In EMR: Yes  Reviewed Allergies In EMR: Yes  Reviewed Surgeries / Procedures: Yes  Date of Onset of Symptoms: 09/15/2013  Guideline(s) Used:  Skin Lesion - Moles or Growths  Disposition Per Guideline:   See Within 2 Weeks in Office  Reason For Disposition Reached:   Caller is uncertain what it is  Advice Given:  Call Back If:  You become worse.  Patient Will Follow Care Advice:  YES

## 2013-09-15 NOTE — Telephone Encounter (Signed)
FYI

## 2013-09-16 NOTE — Telephone Encounter (Signed)
Please call pt and confirm ok - if do not hear back from her.  Thanks

## 2013-09-18 NOTE — Telephone Encounter (Signed)
Left message to call back  

## 2013-09-20 NOTE — Telephone Encounter (Signed)
Pt never returned my call

## 2013-09-25 ENCOUNTER — Telehealth: Payer: Self-pay

## 2013-09-25 ENCOUNTER — Other Ambulatory Visit: Payer: Self-pay | Admitting: *Deleted

## 2013-09-25 MED ORDER — CARVEDILOL 12.5 MG PO TABS: 12.5000 mg | ORAL_TABLET | Freq: Two times a day (BID) | ORAL | Status: DC

## 2013-09-25 NOTE — Telephone Encounter (Signed)
Rx sent for Carvedilol 12.5 BID #60 R#3 to [facility] on [address].

## 2013-09-25 NOTE — Telephone Encounter (Signed)
Requested Prescriptions   Signed Prescriptions Disp Refills  . carvedilol (COREG) 12.5 MG tablet 60 tablet 3    Sig: Take 1 tablet (12.5 mg total) by mouth 2 (two) times daily with a meal.    Authorizing Provider: Minna Merritts    Ordering User: Britt Bottom

## 2013-09-25 NOTE — Telephone Encounter (Signed)
Pt needs refill on Carvedilol called to [facility].

## 2013-10-16 ENCOUNTER — Encounter: Payer: Self-pay | Admitting: Internal Medicine

## 2013-10-16 ENCOUNTER — Ambulatory Visit (INDEPENDENT_AMBULATORY_CARE_PROVIDER_SITE_OTHER): Payer: Commercial Managed Care - HMO | Admitting: Internal Medicine

## 2013-10-16 VITALS — BP 110/70 | HR 65 | Temp 97.9°F | Ht 65.0 in | Wt 137.5 lb

## 2013-10-16 DIAGNOSIS — I2510 Atherosclerotic heart disease of native coronary artery without angina pectoris: Secondary | ICD-10-CM

## 2013-10-16 DIAGNOSIS — K219 Gastro-esophageal reflux disease without esophagitis: Secondary | ICD-10-CM

## 2013-10-16 DIAGNOSIS — F17200 Nicotine dependence, unspecified, uncomplicated: Secondary | ICD-10-CM

## 2013-10-16 DIAGNOSIS — D649 Anemia, unspecified: Secondary | ICD-10-CM

## 2013-10-16 DIAGNOSIS — J449 Chronic obstructive pulmonary disease, unspecified: Secondary | ICD-10-CM

## 2013-10-16 DIAGNOSIS — E785 Hyperlipidemia, unspecified: Secondary | ICD-10-CM

## 2013-10-16 DIAGNOSIS — I428 Other cardiomyopathies: Secondary | ICD-10-CM

## 2013-10-16 DIAGNOSIS — Z86010 Personal history of colonic polyps: Secondary | ICD-10-CM

## 2013-10-16 DIAGNOSIS — I2789 Other specified pulmonary heart diseases: Secondary | ICD-10-CM

## 2013-10-16 DIAGNOSIS — I272 Pulmonary hypertension, unspecified: Secondary | ICD-10-CM

## 2013-10-16 DIAGNOSIS — Z1239 Encounter for other screening for malignant neoplasm of breast: Secondary | ICD-10-CM

## 2013-10-16 DIAGNOSIS — S5400XA Injury of ulnar nerve at forearm level, unspecified arm, initial encounter: Secondary | ICD-10-CM

## 2013-10-16 LAB — BASIC METABOLIC PANEL
BUN: 20 mg/dL (ref 6–23)
CALCIUM: 9.1 mg/dL (ref 8.4–10.5)
CO2: 28 mEq/L (ref 19–32)
Chloride: 103 mEq/L (ref 96–112)
Creatinine, Ser: 1.3 mg/dL — ABNORMAL HIGH (ref 0.4–1.2)
GFR: 42.98 mL/min — ABNORMAL LOW (ref >60.00)
Glucose, Bld: 93 mg/dL (ref 70–99)
Potassium: 4.8 mEq/L (ref 3.5–5.1)
Sodium: 138 mEq/L (ref 135–145)

## 2013-10-16 LAB — CBC WITH DIFFERENTIAL/PLATELET
Basophils Absolute: 0 10*3/uL (ref 0.0–0.1)
Basophils Relative: 0.3 % (ref 0.0–3.0)
EOS ABS: 0.1 10*3/uL (ref 0.0–0.7)
Eosinophils Relative: 1.9 % (ref 0.0–5.0)
HEMATOCRIT: 38.7 % (ref 36.0–46.0)
Hemoglobin: 12.7 g/dL (ref 12.0–15.0)
Lymphocytes Relative: 12.4 % (ref 12.0–46.0)
Lymphs Abs: 0.6 10*3/uL — ABNORMAL LOW (ref 0.7–4.0)
MCHC: 32.9 g/dL (ref 30.0–36.0)
MCV: 93.5 fl (ref 78.0–100.0)
MONO ABS: 0.4 10*3/uL (ref 0.1–1.0)
Monocytes Relative: 8.6 % (ref 3.0–12.0)
Neutro Abs: 3.9 10*3/uL (ref 1.4–7.7)
Neutrophils Relative %: 76.8 % (ref 43.0–77.0)
PLATELETS: 226 10*3/uL (ref 150.0–400.0)
RBC: 4.13 Mil/uL (ref 3.87–5.11)
RDW: 15 % (ref 11.5–15.5)
WBC: 5.1 10*3/uL (ref 4.0–10.5)

## 2013-10-16 LAB — FERRITIN: FERRITIN: 86.9 ng/mL (ref 10.0–291.0)

## 2013-10-16 NOTE — Progress Notes (Signed)
Subjective:    Patient ID: Sandra Ellison, female    DOB: [birth year], 68 y.o.   MRN: [id number]  HPI 68 year old female with past history of cardiomyopathy with EF 35%, CHF, GERD, anemia (followed by Dr Ma Hillock), COPD and tobacco abuse.  She comes in today for a scheduled follow up.   Followed by Dr Rockey Situ and Dr Ma Hillock.  Diagnosed with cardiomyopathy (CHF) a few years ago.  Takes lasix prn now.  Feels from a cardiac standpoint - stable.  Continues to smoke.  We again discussed the need to quit.   She has cut back.  Plans to continue to decrease.  Down to 3 cigarettes per day.  Wants to quit on her own.  No increased sob.  Breathing at baseline.  No acid reflux reported.  On protonix.  Has had EGD previously.  Sees Dr Tiffany Kocher.  Has a history of colon polyps.  Bowels stable.  Takes miralax qod.  No blood.  Sees Dr Ma Hillock every 3-4 months now.  Receiving IV iron infusions.   Has seen Dr Rudene Christians for her wrist.  Had surgery.  Has what sounds like some residual neuropathy.  She reports some increased burning in her right shoulder (posterior) and upper arm. Still present.   Extends from mid to upper arm.  On gabapentin.  Helps.      Past Medical History  Diagnosis Date  . CHF (congestive heart failure)   . GERD (gastroesophageal reflux disease)   . COPD (chronic obstructive pulmonary disease)   . Hx of colonic polyps   . Iron deficiency anemia   . Chronic kidney disease (CKD), stage II (mild)   . Pulmonary HTN   . Arthritis   . Allergy   . History of blood transfusion   . Diverticulosis     H/O  . History of abnormal Pap smear     s/p vaginal hysterectomy    Outpatient Encounter Prescriptions as of 10/16/2013  Medication Sig  . albuterol (PROVENTIL HFA;VENTOLIN HFA) 108 (90 BASE) MCG/ACT inhaler Inhale 2 puffs into the lungs every 6 (six) hours as needed for wheezing.  Marland Kitchen aspirin 81 MG tablet Take 81 mg by mouth daily.    . carvedilol (COREG) 12.5 MG tablet Take 1 tablet (12.5 mg total) by mouth 2  (two) times daily with a meal.  . ferrous fumarate (HEMOCYTE - 106 MG FE) 325 (106 FE) MG TABS tablet Take 2 tablets by mouth daily.   . furosemide (LASIX) 20 MG tablet Take 1 tablet (20 mg total) by mouth as needed.  . gabapentin (NEURONTIN) 300 MG capsule Take 1 capsule (300 mg total) by mouth 3 (three) times daily as needed.  . pantoprazole (PROTONIX) 40 MG tablet Take 1 tablet (40 mg total) by mouth daily.  . simvastatin (ZOCOR) 10 MG tablet Take 1 tablet (10 mg total) by mouth at bedtime.  Marland Kitchen tiotropium (SPIRIVA) 18 MCG inhalation capsule Place 18 mcg into inhaler and inhale as needed.    Review of Systems Patient denies any headache, lightheadedness or dizziness.  No sinus or allergy symptoms.  No chest pain, tightness or palpitations.  No increased shortness of breath.  No cough or congestion.  Breathing overall stable.   No nausea or vomiting.  No abdominal pain or cramping.  Acid controlled on protonix.   No bowel change, such as diarrhea, constipation, BRBPR or melana.  No urine change.  Shoulder pain and arm pain as outlined.  On gabapentin.  Does help.  Objective:   Physical Exam  Filed Vitals:   10/16/13 1121  BP: 110/70  Pulse: 65  Temp: 97.9 F (67.21 C)   68 year old female in no acute distress.   HEENT:  Nares- clear.  Oropharynx - without lesions. NECK:  Supple.  Nontender.  No audible bruit.  HEART:  Appears to be regular. LUNGS:  No crackles or wheezing audible.  Respirations even and unlabored.  RADIAL PULSE:  Equal bilaterally.  ABDOMEN:  Soft, nontender.  Bowel sounds present and normal.  No audible abdominal bruit.    EXTREMITIES:  No increased edema present.  DP pulses palpable and equal bilaterally.  MSK:  Increased pain posterior shoulder.  No pain to palpation upper arm.           Assessment & Plan:  VASCULAR.  Blood pressure lower in right arm compared to left.  States has been present for years.  No change.  States has been worked up previously.   Follow.    HEALTH MAINTENANCE.  Physical 05/16/13.  Discussed need for mammogram.  She is agreeable to be scheduled for mammogram.   Discussed need for colonoscopy.  She wants to hold at this point.    I spent 25 minutes with the patient and more than 50% of the time was spent in consultation regarding the above.

## 2013-10-17 ENCOUNTER — Other Ambulatory Visit: Payer: Self-pay | Admitting: *Deleted

## 2013-10-17 ENCOUNTER — Encounter: Payer: Self-pay | Admitting: Internal Medicine

## 2013-10-17 MED ORDER — TIOTROPIUM BROMIDE MONOHYDRATE 18 MCG IN CAPS: 18.0000 ug | ORAL_CAPSULE | RESPIRATORY_TRACT | Status: DC | PRN

## 2013-10-17 MED ORDER — GABAPENTIN 300 MG PO CAPS: 300.0000 mg | ORAL_CAPSULE | Freq: Three times a day (TID) | ORAL | Status: DC | PRN

## 2013-10-17 MED ORDER — ALBUTEROL SULFATE HFA 108 (90 BASE) MCG/ACT IN AERS: 2.0000 | INHALATION_SPRAY | Freq: Four times a day (QID) | RESPIRATORY_TRACT | Status: DC | PRN

## 2013-10-19 NOTE — Telephone Encounter (Signed)
Unread mychart message mailed to patient 

## 2013-10-23 ENCOUNTER — Encounter: Payer: Self-pay | Admitting: Internal Medicine

## 2013-10-23 NOTE — Assessment & Plan Note (Signed)
Colonoscopy as outlined.  Overdue colonoscopy.  Need to schedule.  Discussed with her today.  Will let me know if agreeable.    

## 2013-10-23 NOTE — Assessment & Plan Note (Signed)
Has had EGD previously.  Sees Dr Elliot. On protonix and doing well.  Follow.  

## 2013-10-23 NOTE — Assessment & Plan Note (Signed)
Was followed by Dr Rudene Christians.  Had NCS - Dr Carlis Abbott.  Was on gabapentin.  Now having burning in her right shoulder and mid to upper arm.  Increased neurontin to 300mg  tid.  Tolerating.  Helping.  Desires no further intervention.  Follow.

## 2013-10-23 NOTE — Assessment & Plan Note (Signed)
Low cholesterol diet and exercise.  On simvastatin.  Follow lipid panel and liver function.   

## 2013-10-23 NOTE — Assessment & Plan Note (Signed)
Currently stable.  Followed by Dr Gollan.  Takes lasix prn.  Followed by cardiology.   

## 2013-10-23 NOTE — Assessment & Plan Note (Signed)
Followed by Dr Ma Hillock.  Now on oral iron.  Recheck cbc today.

## 2013-10-23 NOTE — Assessment & Plan Note (Signed)
Documented - previous echo.  Seeing Dr Gollan.   

## 2013-10-23 NOTE — Assessment & Plan Note (Signed)
Sees Dr Gollan.  Currently stable.   

## 2013-10-23 NOTE — Assessment & Plan Note (Signed)
Have discussed the need to quit.  She has cut down.  Plans to continue to cut down.  Follow.   

## 2013-10-23 NOTE — Assessment & Plan Note (Signed)
On spiriva.  Breathing stable.  Follow.   

## 2013-12-19 ENCOUNTER — Ambulatory Visit: Payer: Self-pay | Admitting: Internal Medicine

## 2013-12-22 ENCOUNTER — Encounter: Payer: Self-pay | Admitting: Internal Medicine

## 2013-12-25 ENCOUNTER — Ambulatory Visit: Payer: Self-pay | Admitting: Internal Medicine

## 2013-12-27 ENCOUNTER — Telehealth: Payer: Self-pay | Admitting: *Deleted

## 2013-12-27 ENCOUNTER — Other Ambulatory Visit: Payer: Self-pay | Admitting: Internal Medicine

## 2013-12-27 DIAGNOSIS — R928 Other abnormal and inconclusive findings on diagnostic imaging of breast: Secondary | ICD-10-CM

## 2013-12-27 NOTE — Telephone Encounter (Signed)
I have tried pt on multiple occasions recently.  Unable to reach her.  I left her a message to call the office to discuss her mammogram.  She has some suspicious calcifications on her mammogram.  I would like to refer her to general surgery for further evaluation and to discuss biopsy.  If she is agreeable, I would like to refer her.  If she does not have a preference, I would like to refer her to Dr Bary Castilla.  Let me know if she calls back and is agreeable to referral and I will place the order for the referral.   Thanks.

## 2013-12-27 NOTE — Telephone Encounter (Signed)
Pt returned call.  Notified of abnormal mammogram.  Refer to surgery.  Pt in agreement.  Will refer to Dr Bary Castilla.  Order placed for referral.

## 2013-12-27 NOTE — Telephone Encounter (Signed)
Sandra Ellison from [facility] called states pts mammogram results recommend a Stereotactic Biopsy.  Requests whether [facility] is to schedule or [facility].  Spoke with Dr Nicki Reaper, she states we, [facility] will schedule biopsy.

## 2013-12-27 NOTE — Progress Notes (Signed)
Order placed for abnormal mammogram.

## 2014-01-03 ENCOUNTER — Encounter: Payer: Self-pay | Admitting: Internal Medicine

## 2014-01-14 ENCOUNTER — Other Ambulatory Visit: Payer: Self-pay | Admitting: Internal Medicine

## 2014-01-23 ENCOUNTER — Encounter: Payer: Self-pay | Admitting: General Surgery

## 2014-01-23 ENCOUNTER — Ambulatory Visit (INDEPENDENT_AMBULATORY_CARE_PROVIDER_SITE_OTHER): Payer: Commercial Managed Care - HMO | Admitting: General Surgery

## 2014-01-23 VITALS — BP 110/64 | HR 78 | Resp 20 | Ht 65.0 in | Wt 141.0 lb

## 2014-01-23 DIAGNOSIS — R920 Mammographic microcalcification found on diagnostic imaging of breast: Secondary | ICD-10-CM

## 2014-01-23 NOTE — Patient Instructions (Addendum)
Continue self breast exams. Call office for any new breast issues or concerns. Right diagnostic mammogram in 6 months and office visit.

## 2014-01-23 NOTE — Progress Notes (Signed)
Patient ID: Sandra Ellison, female   DOB: 26-Sep-1945, 68 y.o.   MRN: [id number]  Chief Complaint  Patient presents with  . Breast Problem    abnormal mammogram    HPI Sandra Ellison is a 68 y.o. female.  who presents for a breast evaluation. The most recent mammogram was done on 01-03-14.  Patient does perform regular self breast checks but does not obtained regular mammograms, her mammogram prior to the recent study was in 2001. No family history of breast or colon cancer. She states she can not feel anything different in the breast. Denies any breast trauma.   The patient is accompanied by her granddaughter, [place], who was present for the interview and exam.  Followed by Dr. Rockey Situ.  HPI  Past Medical History  Diagnosis Date  . CHF (congestive heart failure)   . GERD (gastroesophageal reflux disease)   . COPD (chronic obstructive pulmonary disease)   . Hx of colonic polyps   . Iron deficiency anemia   . Chronic kidney disease (CKD), stage II (mild)   . Pulmonary HTN   . Arthritis   . Allergy   . History of blood transfusion   . Diverticulosis     H/O  . History of abnormal Pap smear     s/p vaginal hysterectomy    Past Surgical History  Procedure Laterality Date  . Tonsillectomy  age 56  . Vaginal hysterectomy  1981    secondary to abnormal pap smear  . Orif distal radius fracture  2011    right    Family History  Problem Relation Age of Onset  . Arthritis Mother   . Alcohol abuse Father   . Arthritis Father   . Cancer Father     throat/spine    Social History History  Substance Use Topics  . Smoking status: Current Every Day Smoker -- 0.25 packs/day for 42 years    Types: Cigarettes  . Smokeless tobacco: Never Used     Comment: has cut back to 2-3 daily  . Alcohol Use: No    Allergies  Allergen Reactions  . Ranitidine Hcl Anaphylaxis and Swelling    Current Outpatient Prescriptions  Medication Sig Dispense Refill  . albuterol  (PROVENTIL HFA;VENTOLIN HFA) 108 (90 BASE) MCG/ACT inhaler Inhale 2 puffs into the lungs every 6 (six) hours as needed for wheezing.  1 Inhaler  1  . aspirin 81 MG tablet Take 81 mg by mouth daily.        . carvedilol (COREG) 12.5 MG tablet Take 1 tablet (12.5 mg total) by mouth 2 (two) times daily with a meal.  60 tablet  3  . ferrous fumarate (HEMOCYTE - 106 MG FE) 325 (106 FE) MG TABS tablet Take 2 tablets by mouth daily.       . furosemide (LASIX) 20 MG tablet Take 1 tablet (20 mg total) by mouth as needed.  30 tablet  1  . gabapentin (NEURONTIN) 300 MG capsule Take 1 capsule (300 mg total) by mouth 3 (three) times daily as needed.  90 capsule  2  . pantoprazole (PROTONIX) 40 MG tablet Take 1 tablet (40 mg total) by mouth daily.  30 tablet  11  . simvastatin (ZOCOR) 10 MG tablet Take 1 tablet (10 mg total) by mouth at bedtime.  90 tablet  1  . SPIRIVA HANDIHALER 18 MCG inhalation capsule PLACE 1 CAPSULE (18 MCG TOTAL) INTO INHALER AND INHALE AS NEEDED.  30 capsule  5   No current  facility-administered medications for this visit.    Review of Systems Review of Systems  Constitutional: Negative.   Respiratory: Positive for shortness of breath.   Cardiovascular: Negative.     Blood pressure 110/64, pulse 78, resp. rate 20, height 5\' 5"  (1.651 m), weight 141 lb (63.957 kg).  Physical Exam Physical Exam  Constitutional: She is oriented to person, place, and time. She appears well-developed and well-nourished.  Neck: Neck supple.  Cardiovascular: Normal rate, regular rhythm and normal heart sounds.   Pulmonary/Chest: Effort normal and breath sounds normal. Right breast exhibits no inverted nipple, no mass, no nipple discharge, no skin change and no tenderness. Left breast exhibits no inverted nipple, no mass, no nipple discharge, no skin change and no tenderness.  Left breast more ptocic.  Lymphadenopathy:    She has no cervical adenopathy.    She has no axillary adenopathy.   Neurological: She is alert and oriented to person, place, and time.  Skin: Skin is warm and dry.    Data Reviewed The patient had screening mammograms completed on December 19, 2013. A small virtual calcifications were identified in the right breast. Total spot compression views recommended.  Additional views of the right breast completed December 25, 2013 showed non-branching, pleomorphic clustered calcifications without architectural distortion. BI-RAD-4.  Assessment    Microcalcifications of the right breast.     Plan    This is essentially screening study in a patient with a greater than 10 year interval between screening studies. This is a low suspicion change, and options for management were reviewed: 1) early stereotactic biopsy to identify the source of the microcalcification versus 2) a 6 month followup exam the right breast to determine the progress of calcifications are developing. If the calcifications are stable ongoing imaging is appropriate, if progressive calcifications are identified biopsy would certainly be strongly encouraged. The risks of observation for a suspected low-grade DCIS is small, and would not change outcome were diagnosis established 6 months from now.  At this time the patient was comfortable with observation and range of 20 for right breast diagnostic mammogram in 6 months with an office visit follow.       Right diagnostic mammogram in 6 months and office visit.  PCP: Vear Clock 01/24/2014, 6:15 AM

## 2014-01-24 DIAGNOSIS — R920 Mammographic microcalcification found on diagnostic imaging of breast: Secondary | ICD-10-CM | POA: Insufficient documentation

## 2014-02-01 ENCOUNTER — Ambulatory Visit (INDEPENDENT_AMBULATORY_CARE_PROVIDER_SITE_OTHER): Payer: Commercial Managed Care - HMO | Admitting: Adult Health

## 2014-02-01 ENCOUNTER — Encounter: Payer: Self-pay | Admitting: Adult Health

## 2014-02-01 VITALS — BP 128/79 | HR 70 | Temp 97.9°F | Resp 14 | Ht 65.0 in | Wt 140.5 lb

## 2014-02-01 DIAGNOSIS — R238 Other skin changes: Secondary | ICD-10-CM

## 2014-02-01 DIAGNOSIS — L988 Other specified disorders of the skin and subcutaneous tissue: Secondary | ICD-10-CM

## 2014-02-01 DIAGNOSIS — B029 Zoster without complications: Secondary | ICD-10-CM | POA: Insufficient documentation

## 2014-02-01 MED ORDER — VALACYCLOVIR HCL 1 G PO TABS: 1000.0000 mg | ORAL_TABLET | Freq: Three times a day (TID) | ORAL | Status: DC

## 2014-02-01 NOTE — Progress Notes (Signed)
Pre visit review using our clinic review tool, if applicable. No additional management support is needed unless otherwise documented below in the visit note. 

## 2014-02-01 NOTE — Progress Notes (Signed)
Patient ID: Sandra Ellison, female   DOB: [birth year], 68 y.o.   MRN: [id number]   Subjective:    Patient ID: Sandra Ellison, female    DOB: 12/03/1945, 68 y.o.   MRN: [id number]  HPI  Pt is a pleasant 68 year old female who presents to clinic with pain on the right side of her ribs and a rash. She reports the pain started approximately 3 or 4 days ago. She then noticed the rash up here approximately 2 days ago. She has applied Aspercreme to the area. No other symptoms associated. No upper respiratory symptoms.  Past Medical History  Diagnosis Date  . CHF (congestive heart failure)   . GERD (gastroesophageal reflux disease)   . COPD (chronic obstructive pulmonary disease)   . Hx of colonic polyps   . Iron deficiency anemia   . Chronic kidney disease (CKD), stage II (mild)   . Pulmonary HTN   . Arthritis   . Allergy   . History of blood transfusion   . Diverticulosis     H/O  . History of abnormal Pap smear     s/p vaginal hysterectomy    Current Outpatient Prescriptions on File Prior to Visit  Medication Sig Dispense Refill  . albuterol (PROVENTIL HFA;VENTOLIN HFA) 108 (90 BASE) MCG/ACT inhaler Inhale 2 puffs into the lungs every 6 (six) hours as needed for wheezing.  1 Inhaler  1  . aspirin 81 MG tablet Take 81 mg by mouth daily.        . carvedilol (COREG) 12.5 MG tablet Take 1 tablet (12.5 mg total) by mouth 2 (two) times daily with a meal.  60 tablet  3  . ferrous fumarate (HEMOCYTE - 106 MG FE) 325 (106 FE) MG TABS tablet Take 2 tablets by mouth daily.       . furosemide (LASIX) 20 MG tablet Take 1 tablet (20 mg total) by mouth as needed.  30 tablet  1  . gabapentin (NEURONTIN) 300 MG capsule Take 1 capsule (300 mg total) by mouth 3 (three) times daily as needed.  90 capsule  2  . pantoprazole (PROTONIX) 40 MG tablet Take 1 tablet (40 mg total) by mouth daily.  30 tablet  11  . simvastatin (ZOCOR) 10 MG tablet Take 1 tablet (10 mg total) by mouth at bedtime.  90 tablet  1  .  SPIRIVA HANDIHALER 18 MCG inhalation capsule PLACE 1 CAPSULE (18 MCG TOTAL) INTO INHALER AND INHALE AS NEEDED.  30 capsule  5   No current facility-administered medications on file prior to visit.     Review of Systems  Constitutional: Negative.   HENT: Negative.   Eyes: Negative.   Respiratory: Negative.   Cardiovascular: Negative.   Gastrointestinal: Negative.   Endocrine: Negative.   Genitourinary: Negative.   Musculoskeletal: Negative.   Skin: Positive for rash.       Painful rash on the right side of ribs  Allergic/Immunologic: Negative.   Neurological: Negative.   Hematological: Negative.   Psychiatric/Behavioral: Negative.        Objective:  BP 128/79  Pulse 70  Temp(Src) 97.9 F (36.6 C) (Oral)  Resp 14  Ht 5\' 5"  (1.651 m)  Wt 140 lb 8 oz (63.73 kg)  BMI 23.38 kg/m2  SpO2 97%   Physical Exam  Constitutional: She is oriented to person, place, and time. No distress.  Cardiovascular: Normal rate and regular rhythm.   Pulmonary/Chest: Effort normal. No respiratory distress.  Musculoskeletal: Normal range of motion.  Neurological: She  is alert and oriented to person, place, and time.  Skin: Skin is warm and dry. Rash noted.  Vesicular rash under right breast and scattered around toward the back. Several areas of redness without vesicles  Psychiatric: She has a normal mood and affect. Her behavior is normal. Judgment and thought content normal.      Assessment & Plan:   1. Rash, vesicular Shingles. Vesicles appeared 2 days ago and still within window for treatment. Valacyclovir 1000 mg tid x 7 days. Apply calamine lotion. Call if pain becomes intolerable. Otherwise, try taking tylenol or ibuprofen OTC for relief.

## 2014-02-01 NOTE — Patient Instructions (Signed)
Start Valtrex (valacyclovir) 1000 mg 3 times a day for 7 days. Apply Calamine lotion to help dry vesicles   Shingles Shingles (herpes zoster) is an infection that is caused by the same virus that causes chickenpox (varicella). The infection causes a painful skin rash and fluid-filled blisters, which eventually break open, crust over, and heal. It may occur in any area of the body, but it usually affects only one side of the body or face. The pain of shingles usually lasts about 1 month. However, some people with shingles may develop long-term (chronic) pain in the affected area of the body. Shingles often occurs many years after the person had chickenpox. It is more common:  In people older than 50 years.  In people with weakened immune systems, such as those with HIV, AIDS, or cancer.  In people taking medicines that weaken the immune system, such as transplant medicines.  In people under great stress. CAUSES  Shingles is caused by the varicella zoster virus (VZV), which also causes chickenpox. After a person is infected with the virus, it can remain in the person's body for years in an inactive state (dormant). To cause shingles, the virus reactivates and breaks out as an infection in a nerve root. The virus can be spread from person to person (contagious) through contact with open blisters of the shingles rash. It will only spread to people who have not had chickenpox. When these people are exposed to the virus, they may develop chickenpox. They will not develop shingles. Once the blisters scab over, the person is no longer contagious and cannot spread the virus to others. SIGNS AND SYMPTOMS  Shingles shows up in stages. The initial symptoms may be pain, itching, and tingling in an area of the skin. This pain is usually described as burning, stabbing, or throbbing.In a few days or weeks, a painful red rash will appear in the area where the pain, itching, and tingling were felt. The rash is  usually on one side of the body in a band or belt-like pattern. Then, the rash usually turns into fluid-filled blisters. They will scab over and dry up in approximately 2-3 weeks. Flu-like symptoms may also occur with the initial symptoms, the rash, or the blisters. These may include:  Fever.  Chills.  Headache.  Upset stomach. DIAGNOSIS  Your health care provider will perform a skin exam to diagnose shingles. Skin scrapings or fluid samples may also be taken from the blisters. This sample will be examined under a microscope or sent to a lab for further testing. TREATMENT  There is no specific cure for shingles. Your health care provider will likely prescribe medicines to help you manage the pain, recover faster, and avoid long-term problems. This may include antiviral drugs, anti-inflammatory drugs, and pain medicines. HOME CARE INSTRUCTIONS   Take a cool bath or apply cool compresses to the area of the rash or blisters as directed. This may help with the pain and itching.   Take medicines only as directed by your health care provider.   Rest as directed by your health care provider.  Keep your rash and blisters clean with mild soap and cool water or as directed by your health care provider.  Do not pick your blisters or scratch your rash. Apply an anti-itch cream or numbing creams to the affected area as directed by your health care provider.  Keep your shingles rash covered with a loose bandage (dressing).  Avoid skin contact with:  Babies.   Pregnant  women.   Children with eczema.   Elderly people with transplants.   People with chronic illnesses, such as leukemia or AIDS.   Wear loose-fitting clothing to help ease the pain of material rubbing against the rash.  Keep all follow-up visits as directed by your health care provider.If the area involved is on your face, you may receive a referral for a specialist, such as an eye doctor (ophthalmologist) or an ear,  nose, and throat (ENT) doctor. Keeping all follow-up visits will help you avoid eye problems, chronic pain, or disability.  SEEK IMMEDIATE MEDICAL CARE IF:   You have facial pain, pain around the eye area, or loss of feeling on one side of your face.  You have ear pain or ringing in your ear.  You have loss of taste.  Your pain is not relieved with prescribed medicines.   Your redness or swelling spreads.   You have more pain and swelling.  Your condition is worsening or has changed.   You have a fever. MAKE SURE YOU:  Understand these instructions.  Will watch your condition.  Will get help right away if you are not doing well or get worse. Document Released: 05/25/2005 Document Revised: 10/09/2013 Document Reviewed: 01/07/2012 [facility] Patient Information 2015 [place], [place]. This information is not intended to replace advice given to you by your health care provider. Make sure you discuss any questions you have with your health care provider.

## 2014-02-03 ENCOUNTER — Other Ambulatory Visit: Payer: Self-pay | Admitting: Internal Medicine

## 2014-02-05 ENCOUNTER — Other Ambulatory Visit: Payer: Self-pay | Admitting: Cardiovascular Disease

## 2014-02-07 ENCOUNTER — Other Ambulatory Visit: Payer: Self-pay | Admitting: Internal Medicine

## 2014-02-21 ENCOUNTER — Ambulatory Visit (INDEPENDENT_AMBULATORY_CARE_PROVIDER_SITE_OTHER): Payer: Commercial Managed Care - HMO | Admitting: Internal Medicine

## 2014-02-21 ENCOUNTER — Encounter: Payer: Self-pay | Admitting: Internal Medicine

## 2014-02-21 VITALS — BP 120/70 | HR 66 | Temp 98.2°F | Ht 65.0 in | Wt 140.5 lb

## 2014-02-21 DIAGNOSIS — I272 Pulmonary hypertension, unspecified: Secondary | ICD-10-CM

## 2014-02-21 DIAGNOSIS — M25562 Pain in left knee: Secondary | ICD-10-CM

## 2014-02-21 DIAGNOSIS — M25569 Pain in unspecified knee: Secondary | ICD-10-CM

## 2014-02-21 DIAGNOSIS — D649 Anemia, unspecified: Secondary | ICD-10-CM

## 2014-02-21 DIAGNOSIS — E785 Hyperlipidemia, unspecified: Secondary | ICD-10-CM

## 2014-02-21 DIAGNOSIS — F17200 Nicotine dependence, unspecified, uncomplicated: Secondary | ICD-10-CM

## 2014-02-21 DIAGNOSIS — J449 Chronic obstructive pulmonary disease, unspecified: Secondary | ICD-10-CM

## 2014-02-21 DIAGNOSIS — R319 Hematuria, unspecified: Secondary | ICD-10-CM

## 2014-02-21 DIAGNOSIS — R920 Mammographic microcalcification found on diagnostic imaging of breast: Secondary | ICD-10-CM

## 2014-02-21 DIAGNOSIS — Z86010 Personal history of colonic polyps: Secondary | ICD-10-CM

## 2014-02-21 DIAGNOSIS — B029 Zoster without complications: Secondary | ICD-10-CM

## 2014-02-21 DIAGNOSIS — K219 Gastro-esophageal reflux disease without esophagitis: Secondary | ICD-10-CM

## 2014-02-21 DIAGNOSIS — I2510 Atherosclerotic heart disease of native coronary artery without angina pectoris: Secondary | ICD-10-CM

## 2014-02-21 DIAGNOSIS — I2789 Other specified pulmonary heart diseases: Secondary | ICD-10-CM

## 2014-02-21 DIAGNOSIS — I428 Other cardiomyopathies: Secondary | ICD-10-CM

## 2014-02-21 MED ORDER — FUROSEMIDE 20 MG PO TABS: 20.0000 mg | ORAL_TABLET | ORAL | Status: DC | PRN

## 2014-02-21 MED ORDER — ALBUTEROL SULFATE HFA 108 (90 BASE) MCG/ACT IN AERS: 2.0000 | INHALATION_SPRAY | Freq: Four times a day (QID) | RESPIRATORY_TRACT | Status: DC | PRN

## 2014-02-21 MED ORDER — GABAPENTIN 100 MG PO CAPS: 100.0000 mg | ORAL_CAPSULE | Freq: Three times a day (TID) | ORAL | Status: DC | PRN

## 2014-02-21 NOTE — Progress Notes (Signed)
Subjective:    Patient ID: Sandra Ellison, female    DOB: 17-Sep-1945, 68 y.o.   MRN: [id number]  HPI 68 year old female with past history of cardiomyopathy with EF 35%, CHF, GERD, anemia (followed by Dr Ma Hillock), COPD and tobacco abuse.  She comes in today for a scheduled follow up.   Followed by Dr Rockey Situ and Dr Ma Hillock.  Diagnosed with cardiomyopathy (CHF) a few years ago.  Takes lasix prn now.  Feels from a cardiac standpoint - stable.  Continues to smoke.  We again discussed the need to quit.   She has cut back.  Plans to continue to decrease.  Down to 3-4 cigarettes per day.  Wants to quit on her own.  No increased sob.  Breathing at baseline.  No acid reflux reported.  On protonix.  Has had EGD previously.  Sees Dr Tiffany Kocher.  Has a history of colon polyps.  Bowels stable.  Takes miralax qod.  No blood.  Sees Dr Ma Hillock every 3-4 months now.  Receiving IV iron infusions.  Was diagnosed with shingles recently.  See Raquel's note for details.  Received valtrex.  Already on gabapentin.  Still feels needs something more for pain.  The burning and itching better.  Had a recent abnormal mammogram.  Saw Dr Bary Castilla.  Recommended a 6 month f/u.     Past Medical History  Diagnosis Date  . CHF (congestive heart failure)   . GERD (gastroesophageal reflux disease)   . COPD (chronic obstructive pulmonary disease)   . Hx of colonic polyps   . Iron deficiency anemia   . Chronic kidney disease (CKD), stage II (mild)   . Pulmonary HTN   . Arthritis   . Allergy   . History of blood transfusion   . Diverticulosis     H/O  . History of abnormal Pap smear     s/p vaginal hysterectomy    Outpatient Encounter Prescriptions as of 02/21/2014  Medication Sig  . albuterol (PROVENTIL HFA;VENTOLIN HFA) 108 (90 BASE) MCG/ACT inhaler Inhale 2 puffs into the lungs every 6 (six) hours as needed for wheezing.  Marland Kitchen aspirin 81 MG tablet Take 81 mg by mouth daily.    . carvedilol (COREG) 12.5 MG tablet TAKE 1 TABLET TWICE A  DAY WITH A MEAL  . ferrous fumarate (HEMOCYTE - 106 MG FE) 325 (106 FE) MG TABS tablet Take 2 tablets by mouth daily.   . furosemide (LASIX) 20 MG tablet Take 1 tablet (20 mg total) by mouth as needed.  . gabapentin (NEURONTIN) 300 MG capsule TAKE 1 CAPSULE BY MOUTH 3 TIMES DAILY AS NEEDED.  Marland Kitchen pantoprazole (PROTONIX) 40 MG tablet TAKE 1 TABLET EVERY DAY  . simvastatin (ZOCOR) 10 MG tablet TAKE 1 TABLET AT BEDTIME  . SPIRIVA HANDIHALER 18 MCG inhalation capsule PLACE 1 CAPSULE (18 MCG TOTAL) INTO INHALER AND INHALE AS NEEDED.  Marland Kitchen valACYclovir (VALTREX) 1000 MG tablet Take 1 tablet (1,000 mg total) by mouth 3 (three) times daily.    Review of Systems Patient denies any headache, lightheadedness or dizziness.  No sinus or allergy symptoms.  No chest pain, tightness or palpitations.  No increased shortness of breath.  No cough or congestion.  Breathing overall stable.   No nausea or vomiting.  No abdominal pain or cramping.  Acid controlled on protonix.   No bowel change, such as diarrhea, constipation, BRBPR or melana.  No urine change.  Increased pain with shingles.  Recently diagnosed.  Recent abnormal mammogram.  Objective:   Physical Exam  Filed Vitals:   02/21/14 0856  BP: 120/70  Pulse: 66  Temp: 98.2 F (36.8 C)   Blood pressure recheck:  124/72 right, blood pressure 98/26 left  69 year old female in no acute distress.   HEENT:  Nares- clear.  Oropharynx - without lesions. NECK:  Supple.  Nontender.  No audible bruit.  HEART:  Appears to be regular. LUNGS:  No crackles or wheezing audible.  Respirations even and unlabored.  RADIAL PULSE:  Equal bilaterally.  ABDOMEN:  Soft, nontender.  Bowel sounds present and normal.  No audible abdominal bruit.    EXTREMITIES:  No increased edema present.  DP pulses palpable and equal bilaterally.         Assessment & Plan:  VASCULAR.  Blood pressure lower in right arm compared to left.  States has been present for years.  No change.   States has been worked up previously.  Follow.    HEALTH MAINTENANCE.  Physical 05/16/13.  Had recent mammogram 12/25/13 - Birads IV.  Saw Dr Bary Castilla.  Planning for 6 month f/u.  Discussed need for colonoscopy.  She wants to hold at this point.    I spent 25 minutes with the patient and more than 50% of the time was spent in consultation regarding the above.

## 2014-02-21 NOTE — Progress Notes (Signed)
Pre visit review using our clinic review tool, if applicable. No additional management support is needed unless otherwise documented below in the visit note. 

## 2014-02-25 ENCOUNTER — Encounter: Payer: Self-pay | Admitting: Internal Medicine

## 2014-02-25 NOTE — Assessment & Plan Note (Addendum)
Not reported as an issue today.  

## 2014-02-25 NOTE — Assessment & Plan Note (Signed)
Followed by Dr Ma Hillock.  Receiving IV iron.  Follow.

## 2014-02-25 NOTE — Assessment & Plan Note (Signed)
Has had EGD previously.  Sees Dr Tiffany Kocher. On protonix and doing well.  Follow.

## 2014-02-25 NOTE — Assessment & Plan Note (Signed)
Have discussed the need to quit.  She has cut down.  Plans to continue to cut down.  Follow.

## 2014-02-25 NOTE — Assessment & Plan Note (Signed)
Currently stable.  Followed by Dr Rockey Situ.  Takes lasix prn.  Followed by cardiology.

## 2014-02-25 NOTE — Assessment & Plan Note (Signed)
On spiriva.  Breathing stable.  Follow.

## 2014-02-25 NOTE — Assessment & Plan Note (Signed)
Low cholesterol diet and exercise.  On simvastatin.  Follow lipid panel and liver function.   

## 2014-02-25 NOTE — Assessment & Plan Note (Signed)
Urinalysis in ER - had rbc's.  F/u urinalysis 2/15 - no rbc's.

## 2014-02-25 NOTE — Assessment & Plan Note (Signed)
Colonoscopy as outlined.  Overdue colonoscopy.  Need to schedule.  Discussed with her today.  Will let me know if agreeable.

## 2014-02-25 NOTE — Assessment & Plan Note (Signed)
Last mammogram 12/25/13 - Birads IV.  Saw Dr Bary Castilla.  Elected to follow.  Recommend f/u mammogram in 6 months.

## 2014-02-25 NOTE — Assessment & Plan Note (Signed)
Documented - previous echo.  Seeing Dr Rockey Situ.

## 2014-02-25 NOTE — Assessment & Plan Note (Signed)
Sees Dr Rockey Situ.  Currently stable.

## 2014-02-25 NOTE — Assessment & Plan Note (Signed)
Diagnosed recently.  Had valtrex.  On gabapentin.  Persistent pain.  Increase gabapentin.  Gradually titrate.  Called in 100mg  capsules to add to 300 (gradually titrate as instructed).  Follow.

## 2014-03-09 ENCOUNTER — Ambulatory Visit: Payer: Commercial Managed Care - HMO | Admitting: Cardiovascular Disease

## 2014-03-15 ENCOUNTER — Ambulatory Visit: Payer: Commercial Managed Care - HMO | Admitting: Cardiovascular Disease

## 2014-03-28 ENCOUNTER — Ambulatory Visit (INDEPENDENT_AMBULATORY_CARE_PROVIDER_SITE_OTHER): Payer: Commercial Managed Care - HMO | Admitting: Cardiovascular Disease

## 2014-03-28 ENCOUNTER — Encounter: Payer: Self-pay | Admitting: Cardiovascular Disease

## 2014-03-28 VITALS — BP 110/80 | HR 66 | Ht 65.0 in | Wt 151.2 lb

## 2014-03-28 DIAGNOSIS — I429 Cardiomyopathy, unspecified: Secondary | ICD-10-CM

## 2014-03-28 DIAGNOSIS — I2510 Atherosclerotic heart disease of native coronary artery without angina pectoris: Secondary | ICD-10-CM

## 2014-03-28 DIAGNOSIS — J449 Chronic obstructive pulmonary disease, unspecified: Secondary | ICD-10-CM

## 2014-03-28 DIAGNOSIS — Z720 Tobacco use: Secondary | ICD-10-CM

## 2014-03-28 DIAGNOSIS — I428 Other cardiomyopathies: Secondary | ICD-10-CM

## 2014-03-28 DIAGNOSIS — R0602 Shortness of breath: Secondary | ICD-10-CM

## 2014-03-28 DIAGNOSIS — B029 Zoster without complications: Secondary | ICD-10-CM

## 2014-03-28 DIAGNOSIS — F17200 Nicotine dependence, unspecified, uncomplicated: Secondary | ICD-10-CM

## 2014-03-28 DIAGNOSIS — E785 Hyperlipidemia, unspecified: Secondary | ICD-10-CM

## 2014-03-28 MED ORDER — SACUBITRIL-VALSARTAN 24-26 MG PO TABS: 1.0000 | ORAL_TABLET | Freq: Two times a day (BID) | ORAL | Status: DC

## 2014-03-28 NOTE — Assessment & Plan Note (Signed)
Mild shortness of breath on today's visit. Recommended she take Lasix as needed. Long history of smoking who continues to smoke. Recent weight gain likely contributing

## 2014-03-28 NOTE — Patient Instructions (Addendum)
Please stop the ensure, weight is going up  Please start entresto one pill twice a day  Please call us if you have new issues that need to be addressed before your next appt.  Your physician wants you to follow-up in: 3 months.    Your next appointment will be scheduled in our new office located at :  [place]  [address], [place]  [place], [place] [address]

## 2014-03-28 NOTE — Assessment & Plan Note (Signed)
Currently with no symptoms of angina. No further workup at this time. Continue current medication regimen. 

## 2014-03-28 NOTE — Assessment & Plan Note (Signed)
We have encouraged her to continue to work on weaning her cigarettes and smoking cessation. She will continue to work on this and does not want any assistance with chantix.  

## 2014-03-28 NOTE — Assessment & Plan Note (Signed)
I suspect her cholesterol will trend upwards given her more than 10 pound weight gain. Suggested she stop drinking Ensure

## 2014-03-28 NOTE — Assessment & Plan Note (Signed)
Recently treated with antiviral. At the tail end of shingles infection

## 2014-03-28 NOTE — Progress Notes (Signed)
Patient ID: Sandra Ellison, female    DOB: 13-Sep-1945, 68 y.o.   MRN: [id number]  HPI Comments: 68 year old woman with a history of peptic ulcer disease, nonischemic cardiomyopathy with history of systolic and diastolic CHF, COPD with long smoking history who continues to smoke, pulmonary hypertension seen on echocardiogram in March 2012 who presented to [facility] with increasing shortness of breath on February 11, 2011, chronic renal insufficiency, stage II, severe iron deficiency anemia,She is still smoking several cigarettes per day She presents for routine followup  In followup today, she reports that she is still getting over shingles on her right flank. She has not had the shingles shot  Weight has climbed significantly from 133 pounds in 2014 up to 151 pounds. She has been drinking Ensure on a regular basis for unclear reasons She continues to smoke on a daily basis. Recent chest wheezing which she attributes to COPD exacerbation.  She continues to take Lasix periodically for shortness of breath and leg swelling Reports having significant left knee pain, also insomnia.   Previous hospital admission in 2012 she was found to have CHF, moderate bilateral pleural effusions with BNP of 26,000. Underlying anemia was a significant component of her presentation. Previously received procrit every other week, bone marrow biopsy, received packed red blood cells x2, iron transfusion x2,  followed by Dr. Ma Hillock.  Echocardiogram in the hospital showed ejection fraction 30-35%, mild to moderate right ventricular systolic pressure estimated at 40-50 mmHg  Echocardiogram performed February 11, 2011 shows ejection fraction less than 25% with severe global hypokinesis, apical akinesis, normal right ventricular systolic function , mild to moderate mitral valve regurgitation, normal right ventricular systolic pressures  Stress test done in the hospital on February 13 2011 shows no significant ischemia. She has a  dilated cardiomyopathy with ejection fraction 27%, old scar in the anteroseptal wall, inferior and inferolateral wall. This was a pharmacologic study  EKG today shows normal sinus rhythm with rate of 66 beats per minute T-wave abnormality in V3 through V6, one and aVL, diffuse low voltage No significant changes       Outpatient Encounter Prescriptions as of 03/28/2014  Medication Sig  . albuterol (PROVENTIL HFA;VENTOLIN HFA) 108 (90 BASE) MCG/ACT inhaler Inhale 2 puffs into the lungs every 6 (six) hours as needed for wheezing.  Marland Kitchen aspirin 81 MG tablet Take 81 mg by mouth daily.    . carvedilol (COREG) 12.5 MG tablet TAKE 1 TABLET TWICE A DAY WITH A MEAL  . ferrous fumarate (HEMOCYTE - 106 MG FE) 325 (106 FE) MG TABS tablet Take 2 tablets by mouth daily.   . furosemide (LASIX) 20 MG tablet Take 1 tablet (20 mg total) by mouth as needed.  . gabapentin (NEURONTIN) 100 MG capsule Take 1 capsule (100 mg total) by mouth 3 (three) times daily as needed.  . gabapentin (NEURONTIN) 300 MG capsule TAKE 1 CAPSULE BY MOUTH 3 TIMES DAILY AS NEEDED.  Marland Kitchen pantoprazole (PROTONIX) 40 MG tablet TAKE 1 TABLET EVERY DAY  . simvastatin (ZOCOR) 10 MG tablet TAKE 1 TABLET AT BEDTIME  . SPIRIVA HANDIHALER 18 MCG inhalation capsule PLACE 1 CAPSULE (18 MCG TOTAL) INTO INHALER AND INHALE AS NEEDED.    Review of Systems  Constitutional: Positive for unexpected weight change.  HENT: Negative.   Eyes: Negative.   Respiratory: Positive for shortness of breath.   Cardiovascular: Negative.   Gastrointestinal: Negative.   Endocrine: Negative.   Musculoskeletal: Positive for arthralgias.  Skin: Negative.   Allergic/Immunologic: Negative.  Neurological: Negative.   Hematological: Negative.   Psychiatric/Behavioral: Positive for sleep disturbance.  All other systems reviewed and are negative.   BP 110/80  Pulse 66  Ht _0  (1.651 m)  Wt 151 lb 4 oz (68.607 kg)  BMI 25.17 kg/m2  Physical Exam  Nursing note and  vitals reviewed. Constitutional: She is oriented to person, place, and time. She appears well-developed and well-nourished.  HENT:  Head: Normocephalic.  Nose: Nose normal.  Mouth/Throat: Oropharynx is clear and moist.  Eyes: Conjunctivae are normal. Pupils are equal, round, and reactive to light.  Neck: Normal range of motion. Neck supple. No JVD present. Carotid bruit is present.  Cardiovascular: Normal rate, regular rhythm, S1 normal, S2 normal, normal heart sounds and intact distal pulses.  Exam reveals no gallop and no friction rub.   No murmur heard. Pulses:      Carotid pulses are 1+ on the right side. Pulmonary/Chest: Effort normal. No respiratory distress. She has decreased breath sounds. She has no wheezes. She has no rales. She exhibits no tenderness.  Abdominal: Soft. Bowel sounds are normal. She exhibits no distension. There is no tenderness.  Musculoskeletal: Normal range of motion. She exhibits no edema and no tenderness.  Lymphadenopathy:    She has no cervical adenopathy.  Neurological: She is alert and oriented to person, place, and time. Coordination normal.  Skin: Skin is warm and dry. No rash noted. No erythema.  Psychiatric: She has a normal mood and affect. Her behavior is normal. Judgment and thought content normal.    Assessment and Plan

## 2014-03-28 NOTE — Assessment & Plan Note (Signed)
We have suggested that she start entresto 24/26 mg by mouth twice a day. Significant compelling data for patients with ejection fraction 35% or less.

## 2014-03-28 NOTE — Assessment & Plan Note (Signed)
Slight wheezing on today's visit. Possible COPD exacerbation. No sign of bronchitis. Suggested she call if symptoms get worse

## 2014-03-29 ENCOUNTER — Ambulatory Visit: Payer: Commercial Managed Care - HMO

## 2014-03-29 ENCOUNTER — Other Ambulatory Visit: Payer: Commercial Managed Care - HMO

## 2014-04-03 ENCOUNTER — Other Ambulatory Visit: Payer: Self-pay | Admitting: Internal Medicine

## 2014-04-04 ENCOUNTER — Encounter: Payer: Self-pay | Admitting: Internal Medicine

## 2014-04-05 ENCOUNTER — Other Ambulatory Visit (INDEPENDENT_AMBULATORY_CARE_PROVIDER_SITE_OTHER): Payer: Commercial Managed Care - HMO

## 2014-04-05 ENCOUNTER — Ambulatory Visit: Payer: Commercial Managed Care - HMO

## 2014-04-05 DIAGNOSIS — I272 Pulmonary hypertension, unspecified: Secondary | ICD-10-CM

## 2014-04-05 DIAGNOSIS — Z23 Encounter for immunization: Secondary | ICD-10-CM

## 2014-04-05 DIAGNOSIS — E785 Hyperlipidemia, unspecified: Secondary | ICD-10-CM

## 2014-04-05 DIAGNOSIS — I270 Primary pulmonary hypertension: Secondary | ICD-10-CM

## 2014-04-05 LAB — BASIC METABOLIC PANEL
BUN: 23 mg/dL (ref 6–23)
CHLORIDE: 104 meq/L (ref 96–112)
CO2: 26 meq/L (ref 19–32)
Calcium: 9.3 mg/dL (ref 8.4–10.5)
Creatinine, Ser: 1.4 mg/dL — ABNORMAL HIGH (ref 0.4–1.2)
GFR: 40.76 mL/min — ABNORMAL LOW (ref >60.00)
Glucose, Bld: 68 mg/dL — ABNORMAL LOW (ref 70–99)
POTASSIUM: 4.7 meq/L (ref 3.5–5.1)
Sodium: 138 mEq/L (ref 135–145)

## 2014-04-05 LAB — HEPATIC FUNCTION PANEL
ALK PHOS: 92 U/L (ref 39–117)
ALT: 14 U/L (ref 0–35)
AST: 20 U/L (ref 0–37)
Albumin: 3.2 g/dL — ABNORMAL LOW (ref 3.5–5.2)
BILIRUBIN DIRECT: 0.2 mg/dL (ref 0.0–0.3)
BILIRUBIN TOTAL: 1.1 mg/dL (ref 0.2–1.2)
TOTAL PROTEIN: 6.9 g/dL (ref 6.0–8.3)

## 2014-04-05 LAB — LIPID PANEL
CHOLESTEROL: 127 mg/dL (ref 0–200)
HDL: 33.3 mg/dL — ABNORMAL LOW (ref >39.00)
LDL CALC: 80 mg/dL (ref 0–99)
NonHDL: 93.7
TRIGLYCERIDES: 68 mg/dL (ref 0.0–149.0)
Total CHOL/HDL Ratio: 4
VLDL: 13.6 mg/dL (ref 0.0–40.0)

## 2014-04-09 ENCOUNTER — Other Ambulatory Visit: Payer: Self-pay | Admitting: Internal Medicine

## 2014-04-09 ENCOUNTER — Encounter: Payer: Self-pay | Admitting: Cardiovascular Disease

## 2014-04-09 DIAGNOSIS — D649 Anemia, unspecified: Secondary | ICD-10-CM

## 2014-04-09 NOTE — Progress Notes (Signed)
Order placed for cbc.  

## 2014-04-23 ENCOUNTER — Other Ambulatory Visit: Payer: Self-pay | Admitting: Internal Medicine

## 2014-04-26 ENCOUNTER — Other Ambulatory Visit: Payer: Self-pay | Admitting: Internal Medicine

## 2014-05-08 ENCOUNTER — Other Ambulatory Visit: Payer: Self-pay | Admitting: Internal Medicine

## 2014-05-09 ENCOUNTER — Telehealth: Payer: Self-pay

## 2014-05-09 NOTE — Telephone Encounter (Signed)
Sandra Ellison was denied coverage under Medicare Part D. We need to submit documentation supporting medical necessity. Enalapril 20 mg, Lisinopril 20 mg, Valsartan 320 mg and Losartan 150 mg must be shown to be ineffective or would have adverse effects. Please advise.  Thank you.

## 2014-05-09 NOTE — Telephone Encounter (Signed)
Prior Authorization for Sandra Ellison was denied. Following up on appeal documents. Please call.

## 2014-05-09 NOTE — Telephone Encounter (Signed)
When in entresto runs out, Would change to Cozaar 50 mg daily  For the first week or 2 would take one half Cozaar before increasing to a full pill

## 2014-05-10 MED ORDER — LOSARTAN POTASSIUM 50 MG PO TABS: 50.0000 mg | ORAL_TABLET | Freq: Every day | ORAL | Status: DC

## 2014-05-10 NOTE — Telephone Encounter (Signed)
Spoke w/ pt.  Advised her of Dr. Gollan's recommendation.  She verbalizes understanding and will call back w/ any further questions or concerns.  

## 2014-05-10 NOTE — Telephone Encounter (Signed)
Left message for pt to call back  °

## 2014-05-14 ENCOUNTER — Emergency Department: Payer: Self-pay | Admitting: Emergency Medicine

## 2014-05-14 LAB — BASIC METABOLIC PANEL
Anion Gap: 9 (ref 7–16)
BUN: 21 mg/dL — AB (ref 7–18)
CO2: 26 mmol/L (ref 21–32)
Calcium, Total: 8.9 mg/dL (ref 8.5–10.1)
Chloride: 102 mmol/L (ref 98–107)
Creatinine: 1.43 mg/dL — ABNORMAL HIGH (ref 0.60–1.30)
EGFR (African American): 47 — ABNORMAL LOW
EGFR (Non-African Amer.): 39 — ABNORMAL LOW
GLUCOSE: 104 mg/dL — AB (ref 65–99)
OSMOLALITY: 277 (ref 275–301)
Potassium: 4.9 mmol/L (ref 3.5–5.1)
Sodium: 137 mmol/L (ref 136–145)

## 2014-05-14 LAB — CBC
HCT: 42.3 % (ref 35.0–47.0)
HGB: 14 g/dL (ref 12.0–16.0)
MCH: 32.4 pg (ref 26.0–34.0)
MCHC: 33 g/dL (ref 32.0–36.0)
MCV: 98 fL (ref 80–100)
PLATELETS: 246 10*3/uL (ref 150–440)
RBC: 4.31 10*6/uL (ref 3.80–5.20)
RDW: 13.9 % (ref 11.5–14.5)
WBC: 5.8 10*3/uL (ref 3.6–11.0)

## 2014-05-14 LAB — TROPONIN I

## 2014-05-14 LAB — CK TOTAL AND CKMB (NOT AT ARMC): CK, TOTAL: 30 U/L (ref 26–192)

## 2014-05-16 LAB — CBC
HCT: 46.5 % (ref 35.0–47.0)
HGB: 15.2 g/dL (ref 12.0–16.0)
MCH: 32.4 pg (ref 26.0–34.0)
MCHC: 32.8 g/dL (ref 32.0–36.0)
MCV: 99 fL (ref 80–100)
Platelet: 289 10*3/uL (ref 150–440)
RBC: 4.71 10*6/uL (ref 3.80–5.20)
RDW: 13.6 % (ref 11.5–14.5)
WBC: 10.3 10*3/uL (ref 3.6–11.0)

## 2014-05-16 LAB — BASIC METABOLIC PANEL
Anion Gap: 7 (ref 7–16)
BUN: 30 mg/dL — AB (ref 7–18)
CHLORIDE: 106 mmol/L (ref 98–107)
CO2: 26 mmol/L (ref 21–32)
CREATININE: 1.46 mg/dL — AB (ref 0.60–1.30)
Calcium, Total: 8.8 mg/dL (ref 8.5–10.1)
EGFR (African American): 46 — ABNORMAL LOW
GFR CALC NON AF AMER: 38 — AB
Glucose: 122 mg/dL — ABNORMAL HIGH (ref 65–99)
Osmolality: 285 (ref 275–301)
Potassium: 4.5 mmol/L (ref 3.5–5.1)
Sodium: 139 mmol/L (ref 136–145)

## 2014-05-16 LAB — TROPONIN I

## 2014-05-17 ENCOUNTER — Observation Stay: Payer: Self-pay | Admitting: Internal Medicine

## 2014-05-17 DIAGNOSIS — I340 Nonrheumatic mitral (valve) insufficiency: Secondary | ICD-10-CM

## 2014-05-17 LAB — TROPONIN I
Troponin-I: <0.02 ng/mL
Troponin-I: <0.02 ng/mL

## 2014-05-17 LAB — PRO B NATRIURETIC PEPTIDE: B-Type Natriuretic Peptide: 30459 pg/mL — ABNORMAL HIGH (ref 0–125)

## 2014-05-21 ENCOUNTER — Encounter: Payer: Self-pay | Admitting: Cardiovascular Disease

## 2014-05-21 ENCOUNTER — Ambulatory Visit (INDEPENDENT_AMBULATORY_CARE_PROVIDER_SITE_OTHER): Payer: Commercial Managed Care - HMO | Admitting: Cardiovascular Disease

## 2014-05-21 VITALS — BP 112/78 | HR 64 | Ht 65.5 in | Wt 141.8 lb

## 2014-05-21 DIAGNOSIS — J449 Chronic obstructive pulmonary disease, unspecified: Secondary | ICD-10-CM

## 2014-05-21 DIAGNOSIS — I272 Pulmonary hypertension, unspecified: Secondary | ICD-10-CM

## 2014-05-21 DIAGNOSIS — R0602 Shortness of breath: Secondary | ICD-10-CM

## 2014-05-21 DIAGNOSIS — F17200 Nicotine dependence, unspecified, uncomplicated: Secondary | ICD-10-CM

## 2014-05-21 DIAGNOSIS — J209 Acute bronchitis, unspecified: Secondary | ICD-10-CM | POA: Insufficient documentation

## 2014-05-21 DIAGNOSIS — E785 Hyperlipidemia, unspecified: Secondary | ICD-10-CM

## 2014-05-21 DIAGNOSIS — I5022 Chronic systolic (congestive) heart failure: Secondary | ICD-10-CM | POA: Insufficient documentation

## 2014-05-21 MED ORDER — FUROSEMIDE 20 MG PO TABS: 20.0000 mg | ORAL_TABLET | Freq: Every day | ORAL | Status: DC | PRN

## 2014-05-21 MED ORDER — LOSARTAN POTASSIUM 25 MG PO TABS: 25.0000 mg | ORAL_TABLET | Freq: Every day | ORAL | Status: DC

## 2014-05-21 NOTE — Progress Notes (Addendum)
Patient ID: Sandra Ellison, female    DOB: 02/13/1946, 68 y.o.   MRN: [id number]  HPI Comments: 68 year old woman with a history of peptic ulcer disease, nonischemic cardiomyopathy with history of systolic and diastolic CHF, COPD with long smoking history who continues to smoke, pulmonary hypertension seen on echocardiogram in March 2012 who presented to [facility] with increasing shortness of breath on February 11, 2011, chronic renal insufficiency, stage II, severe iron deficiency anemia,She is still smoking several cigarettes per day She presents for routine followup of her systolic heart failure  In follow-up today, she reports that she was recently in the hospital for shortness of breath. Symptoms started with cough, sputum consistent with bronchitis. Hospital records were reviewed with her She was initially given antibiotics in the emergency room and sent home She repeat presented to the emergency room shortly later and was kept overnight treated with antibiotic, nebulizers, prednisone. She reports that she had 4 days of Levaquin and is still finishing her prednisone taper. Symptoms are much better but still continues to have a deep thick cough She does not feel that it was second to heart failure. In the hospital was treated with aggressive diuresis. Initial creatinine on arrival 1.46, BUN 30. Follow-up basic metabolic panel was not done prior to discharge. BNP on arrival was 30,000 In general she takes Lasix when necessary which has worked well for her. Weight is down from previous visits. She stopped drinking Ensure on a regular basis Reports having significant left knee pain, also insomnia.   EKG on today's visit normal sinus rhythm with rate 64 bpm, nonspecific T wave abnormality in the anterolateral leads, no changes  Echo done in the hospital 05/17/2014 was reviewed with her showing ejection fraction 20-25%, mildly elevated right trigger systolic pressures  Other past medical  history Previous hospital admission in 2012 she was found to have CHF, moderate bilateral pleural effusions with BNP of 26,000. Underlying anemia was a significant component of her presentation. Previously received procrit every other week, bone marrow biopsy, received packed red blood cells x2, iron transfusion x2,  followed by Dr. Ma Hillock.  Echocardiogram in the hospital showed ejection fraction 30-35%, mild to moderate right ventricular systolic pressure estimated at 40-50 mmHg  Echocardiogram performed February 11, 2011 shows ejection fraction less than 25% with severe global hypokinesis, apical akinesis, normal right ventricular systolic function , mild to moderate mitral valve regurgitation, normal right ventricular systolic pressures  Stress test done in the hospital on February 13 2011 shows no significant ischemia. She has a dilated cardiomyopathy with ejection fraction 27%, old scar in the anteroseptal wall, inferior and inferolateral wall. This was a pharmacologic study         Allergies  Allergen Reactions  . Ranitidine Hcl Anaphylaxis and Swelling    Outpatient Encounter Prescriptions as of 05/21/2014  Medication Sig  . albuterol (PROVENTIL HFA;VENTOLIN HFA) 108 (90 BASE) MCG/ACT inhaler Inhale 2 puffs into the lungs every 6 (six) hours as needed for wheezing.  Marland Kitchen aspirin 81 MG tablet Take 81 mg by mouth daily.    . carvedilol (COREG) 3.125 MG tablet Take 3.125 mg by mouth 2 (two) times daily with a meal.   . ferrous fumarate (HEMOCYTE - 106 MG FE) 325 (106 FE) MG TABS tablet Take 2 tablets by mouth daily.   . furosemide (LASIX) 20 MG tablet Take 1 tablet (20 mg total) by mouth daily as needed.  . gabapentin (NEURONTIN) 100 MG capsule TAKE 1 CAPSULE (100 MG TOTAL) BY  MOUTH 3 (THREE) TIMES DAILY AS NEEDED.  Marland Kitchen gabapentin (NEURONTIN) 300 MG capsule TAKE 1 CAPSULE BY MOUTH 3 TIMES DAILY AS NEEDED.  Marland Kitchen pantoprazole (PROTONIX) 40 MG tablet TAKE 1 TABLET EVERY DAY  . predniSONE  (DELTASONE) 10 MG tablet Take 10 mg by mouth daily with breakfast.   . simvastatin (ZOCOR) 10 MG tablet TAKE 1 TABLET AT BEDTIME  . SPIRIVA HANDIHALER 18 MCG inhalation capsule PLACE 1 CAPSULE (18 MCG TOTAL) INTO INHALER AND INHALE AS NEEDED.  . [DISCONTINUED] furosemide (LASIX) 20 MG tablet Take 20 mg by mouth daily.  . [DISCONTINUED] lisinopril (PRINIVIL,ZESTRIL) 5 MG tablet Take 5 mg by mouth daily.   Marland Kitchen losartan (COZAAR) 25 MG tablet Take 1 tablet (25 mg total) by mouth daily.  . [DISCONTINUED] carvedilol (COREG) 12.5 MG tablet TAKE 1 TABLET TWICE A DAY WITH A MEAL (Patient not taking: Reported on 05/21/2014)  . [DISCONTINUED] furosemide (LASIX) 20 MG tablet TAKE 1 TABLET (20 MG TOTAL) BY MOUTH AS NEEDED. (Patient not taking: Reported on 05/21/2014)  . [DISCONTINUED] losartan (COZAAR) 50 MG tablet Take 1 tablet (50 mg total) by mouth daily. (Patient not taking: Reported on 05/21/2014)  . [DISCONTINUED] Sacubitril-Valsartan (ENTRESTO) 24-26 MG TABS Take 1 tablet by mouth 2 (two) times daily. (Patient not taking: Reported on 05/21/2014)    Past Medical History  Diagnosis Date  . CHF (congestive heart failure)   . GERD (gastroesophageal reflux disease)   . COPD (chronic obstructive pulmonary disease)   . Hx of colonic polyps   . Iron deficiency anemia   . Chronic kidney disease (CKD), stage II (mild)   . Pulmonary HTN   . Arthritis   . Allergy   . History of blood transfusion   . Diverticulosis     H/O  . History of abnormal Pap smear     s/p vaginal hysterectomy    Past Surgical History  Procedure Laterality Date  . Tonsillectomy  age 53  . Vaginal hysterectomy  1981    secondary to abnormal pap smear  . Orif distal radius fracture  2011    right    Social History  reports that she has been smoking Cigarettes.  She has a 10.5 pack-year smoking history. She has never used smokeless tobacco. She reports that she does not drink alcohol or use illicit drugs.  Family  History family history includes Alcohol abuse in her father; Arthritis in her father and mother; Cancer in her father.       Review of Systems  Constitutional: Negative.   Eyes: Negative.   Respiratory: Positive for cough and shortness of breath.   Cardiovascular: Negative.   Gastrointestinal: Negative.   Musculoskeletal: Positive for arthralgias.  Neurological: Negative.   Hematological: Negative.   Psychiatric/Behavioral: Positive for sleep disturbance.  All other systems reviewed and are negative.   BP 112/78 mmHg  Pulse 64  Ht 5' 5.5" (1.664 m)  Wt 141 lb 12 oz (64.297 kg)  BMI 23.22 kg/m2  Physical Exam  Constitutional: She is oriented to person, place, and time. She appears well-developed and well-nourished.  HENT:  Head: Normocephalic.  Nose: Nose normal.  Mouth/Throat: Oropharynx is clear and moist.  Eyes: Conjunctivae are normal. Pupils are equal, round, and reactive to light.  Neck: Normal range of motion. Neck supple. No JVD present.  Cardiovascular: Normal rate, regular rhythm, S1 normal, S2 normal, normal heart sounds and intact distal pulses.  Exam reveals no gallop and no friction rub.   No murmur heard. Pulmonary/Chest:  Effort normal and breath sounds normal. No respiratory distress. She has wheezes. She has rales. She exhibits no tenderness.  Abdominal: Soft. Bowel sounds are normal. She exhibits no distension. There is no tenderness.  Musculoskeletal: Normal range of motion. She exhibits no edema or tenderness.  Lymphadenopathy:    She has no cervical adenopathy.  Neurological: She is alert and oriented to person, place, and time. Coordination normal.  Skin: Skin is warm and dry. No rash noted. No erythema.  Psychiatric: She has a normal mood and affect. Her behavior is normal. Judgment and thought content normal.    Assessment and Plan  Nursing note and vitals reviewed.

## 2014-05-21 NOTE — Assessment & Plan Note (Signed)
Nonischemic cardiopathy, previously had been relatively stable taking Lasix periodically. Elevated BNP likely from underlying rightheart stretch, severe lung disease. Given her renal dysfunction on arrival to the hospital and then given aggressive IV Lasix, recommended she take Lasix only as needed. Symptoms more consistent with acute bronchitis with COPD exacerbation

## 2014-05-21 NOTE — Assessment & Plan Note (Signed)
Cholesterol is at goal on the current lipid regimen. No changes to the medications were made.  

## 2014-05-21 NOTE — Assessment & Plan Note (Signed)
Recent echocardiogram with minimally elevated right heart pressures. Again suggested gentle Lasix as needed for any weight gain, leg edema given her renal dysfunction

## 2014-05-21 NOTE — Assessment & Plan Note (Signed)
We have encouraged her to continue to work on weaning her cigarettes and smoking cessation. She will continue to work on this and does not want any assistance with chantix.  

## 2014-05-21 NOTE — Assessment & Plan Note (Signed)
She has stopped smoking since her recent hospital discharge. May need an a box and prednisone when necessary for acute onset of symptoms

## 2014-05-21 NOTE — Assessment & Plan Note (Signed)
Recent hospitalization for acute bronchitis, COPD exacerbation. Doing mildly better since then his own, antibiotics. She has follow-up with primary care in 2 days' time. If symptoms do not improve, may need a longer course of antibiotics and prednisone. High risk for recurrent symptoms given her long smoking history. Less likely heart failure given her renal dysfunction on arrival to the hospital

## 2014-05-21 NOTE — Patient Instructions (Addendum)
You are doing well. Please take lasix as needed  Please hold the lisinopril Start losartan 1/2 pill per day (25 mg daily)  Please call us if you have new issues that need to be addressed before your next appt.  Your physician wants you to follow-up in: 6 months.  You will receive a reminder letter in the mail two months in advance. If you don't receive a letter, please call our office to schedule the follow-up appointment.

## 2014-05-23 ENCOUNTER — Telehealth: Payer: Self-pay | Admitting: *Deleted

## 2014-05-23 ENCOUNTER — Ambulatory Visit (INDEPENDENT_AMBULATORY_CARE_PROVIDER_SITE_OTHER): Payer: Commercial Managed Care - HMO | Admitting: Internal Medicine

## 2014-05-23 ENCOUNTER — Emergency Department: Payer: Self-pay | Admitting: Emergency Medicine

## 2014-05-23 ENCOUNTER — Encounter: Payer: Self-pay | Admitting: Internal Medicine

## 2014-05-23 VITALS — BP 122/82 | HR 68 | Temp 98.1°F | Ht 65.5 in | Wt 138.0 lb

## 2014-05-23 DIAGNOSIS — J449 Chronic obstructive pulmonary disease, unspecified: Secondary | ICD-10-CM

## 2014-05-23 DIAGNOSIS — I428 Other cardiomyopathies: Secondary | ICD-10-CM

## 2014-05-23 DIAGNOSIS — I429 Cardiomyopathy, unspecified: Secondary | ICD-10-CM

## 2014-05-23 DIAGNOSIS — Z720 Tobacco use: Secondary | ICD-10-CM

## 2014-05-23 DIAGNOSIS — J209 Acute bronchitis, unspecified: Secondary | ICD-10-CM

## 2014-05-23 DIAGNOSIS — R0602 Shortness of breath: Secondary | ICD-10-CM

## 2014-05-23 DIAGNOSIS — F17200 Nicotine dependence, unspecified, uncomplicated: Secondary | ICD-10-CM

## 2014-05-23 DIAGNOSIS — I2510 Atherosclerotic heart disease of native coronary artery without angina pectoris: Secondary | ICD-10-CM

## 2014-05-23 DIAGNOSIS — K219 Gastro-esophageal reflux disease without esophagitis: Secondary | ICD-10-CM

## 2014-05-23 LAB — BASIC METABOLIC PANEL
ANION GAP: 7 (ref 7–16)
BUN: 42 mg/dL — ABNORMAL HIGH (ref 7–18)
CO2: 29 mmol/L (ref 21–32)
CREATININE: 1.4 mg/dL — AB (ref 0.60–1.30)
Calcium, Total: 8.9 mg/dL (ref 8.5–10.1)
Chloride: 101 mmol/L (ref 98–107)
EGFR (African American): 48 — ABNORMAL LOW
GFR CALC NON AF AMER: 40 — AB
Glucose: 129 mg/dL — ABNORMAL HIGH (ref 65–99)
Osmolality: 286 (ref 275–301)
POTASSIUM: 4.4 mmol/L (ref 3.5–5.1)
Sodium: 137 mmol/L (ref 136–145)

## 2014-05-23 LAB — CBC
HCT: 54.8 % — ABNORMAL HIGH (ref 35.0–47.0)
HGB: 18 g/dL — AB (ref 12.0–16.0)
MCH: 31.9 pg (ref 26.0–34.0)
MCHC: 32.8 g/dL (ref 32.0–36.0)
MCV: 97 fL (ref 80–100)
PLATELETS: 289 10*3/uL (ref 150–440)
RBC: 5.64 10*6/uL — AB (ref 3.80–5.20)
RDW: 13.2 % (ref 11.5–14.5)
WBC: 8.4 10*3/uL (ref 3.6–11.0)

## 2014-05-23 LAB — TROPONIN I: Troponin-I: <0.02 ng/mL

## 2014-05-23 MED ORDER — ALBUTEROL SULFATE (2.5 MG/3ML) 0.083% IN NEBU
2.5000 mg | INHALATION_SOLUTION | Freq: Once | RESPIRATORY_TRACT | Status: AC
  Administered 2014-05-23: 2.5 mg via RESPIRATORY_TRACT

## 2014-05-23 MED ORDER — PREDNISONE 10 MG PO TABS: ORAL_TABLET | ORAL | Status: DC

## 2014-05-23 MED ORDER — LEVOFLOXACIN 250 MG PO TABS: 250.0000 mg | ORAL_TABLET | Freq: Every day | ORAL | Status: DC

## 2014-05-23 NOTE — Telephone Encounter (Signed)
Daughter states that she is having a hard time getting her to agree on going anywhere else, but she will keep trying.

## 2014-05-23 NOTE — Progress Notes (Signed)
Subjective:    Patient ID: Sandra Ellison, female    DOB: January 03, 1946, 68 y.o.   MRN: [id number]  HPI 68 year old female with past history of cardiomyopathy with EF 35%, CHF, GERD, anemia (followed by Dr Ma Hillock), COPD and tobacco abuse.  She comes in today to follow up on these issues as well as for a complete physical exam.  She was just admitted to the hospital 05/17/14.  Was diagnosed with acute on chronic systolic heart failure.  Was also diagnosed with a mild copd exacerbation.  Was given lasix and prednisone in the hospital.  Just saw Dr Rockey Situ day before yesterday.  . See his note for details. He felt from a cardiac standpoint, things were stable.   Usually takes lasix prn.    Continues to smoke.  We again discussed the need to quit.   She has cut back.  Since being sick - has not smoked.   No acid reflux reported.  On protonix.  Has had EGD previously.  Sees Dr Tiffany Kocher.  Has a history of colon polyps.  Bowels stable.  Since discharge, pt reports still feeling sob.  Is more sob over the last 24 hours.  With increased cough, congestion and wheezing.  Sob with getting on the table to be examined.  Not eating.     Past Medical History  Diagnosis Date  . CHF (congestive heart failure)   . GERD (gastroesophageal reflux disease)   . COPD (chronic obstructive pulmonary disease)   . Hx of colonic polyps   . Iron deficiency anemia   . Chronic kidney disease (CKD), stage II (mild)   . Pulmonary HTN   . Arthritis   . Allergy   . History of blood transfusion   . Diverticulosis     H/O  . History of abnormal Pap smear     s/p vaginal hysterectomy    Outpatient Encounter Prescriptions as of 05/23/2014  Medication Sig  . albuterol (PROVENTIL HFA;VENTOLIN HFA) 108 (90 BASE) MCG/ACT inhaler Inhale 2 puffs into the lungs every 6 (six) hours as needed for wheezing.  Marland Kitchen aspirin 81 MG tablet Take 81 mg by mouth daily.    . carvedilol (COREG) 3.125 MG tablet Take 3.125 mg by mouth 2 (two) times daily  with a meal.   . ferrous fumarate (HEMOCYTE - 106 MG FE) 325 (106 FE) MG TABS tablet Take 2 tablets by mouth daily.   . furosemide (LASIX) 20 MG tablet Take 1 tablet (20 mg total) by mouth daily as needed.  . gabapentin (NEURONTIN) 100 MG capsule TAKE 1 CAPSULE (100 MG TOTAL) BY MOUTH 3 (THREE) TIMES DAILY AS NEEDED.  Marland Kitchen gabapentin (NEURONTIN) 300 MG capsule TAKE 1 CAPSULE BY MOUTH 3 TIMES DAILY AS NEEDED.  Marland Kitchen losartan (COZAAR) 25 MG tablet Take 1 tablet (25 mg total) by mouth daily.  . pantoprazole (PROTONIX) 40 MG tablet TAKE 1 TABLET EVERY DAY  . predniSONE (DELTASONE) 10 MG tablet Take 10 mg by mouth daily with breakfast.   . simvastatin (ZOCOR) 10 MG tablet TAKE 1 TABLET AT BEDTIME  . SPIRIVA HANDIHALER 18 MCG inhalation capsule PLACE 1 CAPSULE (18 MCG TOTAL) INTO INHALER AND INHALE AS NEEDED.    Review of Systems Patient denies any headache, lightheadedness or dizziness.  No sinus or allergy symptoms.  Reports increased sob and cough.  Increased congestion.  Sob with exertion.  Hard to get on the table.  Taking abx and prednisone taper.  Does not appear to be improving.  No nausea or vomiting.  No abdominal pain or cramping.  Acid controlled on protonix.   No bowel change, such as diarrhea, constipation, BRBPR or melana.  Increased fatigue.          Objective:   Physical Exam  Filed Vitals:   05/23/14 1035  BP: 122/82  Pulse: 68  Temp: 98.1 F (23.83 C)   68 year old female in no acute distress.  She does appear to not feel well.    HEENT:  Nares- clear.  Oropharynx - without lesions. NECK:  Supple.  Nontender.   HEART:  Appears to be regular. LUNGS:  Some congestion in the bases.  Increased cough with forced expiration.   RADIAL PULSE:  Decreased in right arm.  ABDOMEN:  Soft, nontender.  Bowel sounds present and normal.  No audible abdominal bruit.    EXTREMITIES:  No increased edema present.  DP pulses palpable and equal bilaterally.         Assessment & Plan:  1. SOB  (shortness of breath) Increased sob with minimal exertion.  Has been on abx and prednisone.  Failing out pt therapy.  She was given a neb here in the office, with minimal improvement.  After long discussion with the pt and her granddaughter, it was decided to send her back to ER for further evaluation and treatment and possible admission.   - albuterol (PROVENTIL) (2.5 MG/3ML) 0.083% nebulizer solution 2.5 mg; Take 3 mLs (2.5 mg total) by nebulization once.  2. Acute bronchitis, unspecified organism See above.  Failing out pt therapy.  Increased sob.  To ER.   3. Chronic obstructive pulmonary disease, unspecified COPD, unspecified chronic bronchitis type See above.    4. Gastroesophageal reflux disease, esophagitis presence not specified Controled on protonix.    5. Smoking Has stopped since being sick.  Instructed to remain off.    6. Coronary artery disease involving native coronary artery of native heart without angina pectoris Just saw Dr Rockey Situ.  Felt to be stable.    7. Nonischemic cardiomyopathy Does not appear to have volume overload.  Of lasix.  Just takes prn.    8. VASCULAR.  Blood pressure lower in right arm compared to left.  States has been present for years.  No change.  States has been worked up previously.  Follow.    HEALTH MAINTENANCE.  Physical 05/16/13.  Will postpone physical today.  Had recent mammogram 12/25/13 - Birads IV.  Saw Dr Bary Castilla.  Planning for 6 month f/u.  Have discussed need for colonoscopy.  She wants to hold at this point.    I spent 25 minutes with the patient and more than 50% of the time was spent in consultation regarding the above.

## 2014-05-23 NOTE — Telephone Encounter (Signed)
If she is there and has been waiting, I would recommend her to stay for an evaluation for the reasons we discussed while she was here.

## 2014-05-23 NOTE — Telephone Encounter (Signed)
Daughter called to report that Sandra Ellison has been at the ER waiting to be seen since 10:30. She has had a CXR & they made her walk all across the hospital with labored breathing (no wheelchair available). Pt does not want to continue to wait & wants to know if she could try the nebulizer & increased dose of Prednisone that you mentioned. Please advise.

## 2014-05-23 NOTE — Telephone Encounter (Signed)
I would recommend [facility] today for same reasons we discussed.  Let me know if a problem.

## 2014-05-23 NOTE — Telephone Encounter (Signed)
Called and spoke with granddaughter.  Pt desires not to go to ER now.  Called in Levaquin 250mg  q day x 10.  Also sent in rx for prednisone taper - to [place].  Neb machine and albuterol neb - called in to [facility].  (called in #[phone] with no refills).  pts granddaughter aware.  Will monitor pt closely and she will take her to be reevaluated if any change or problems.

## 2014-05-23 NOTE — Progress Notes (Signed)
Pre visit review using our clinic review tool, if applicable. No additional management support is needed unless otherwise documented below in the visit note. 

## 2014-05-23 NOTE — Telephone Encounter (Signed)
Pt has already left & states that she will go anywhere else but she will not go back to [facility].

## 2014-05-27 ENCOUNTER — Encounter: Payer: Self-pay | Admitting: Internal Medicine

## 2014-05-28 ENCOUNTER — Ambulatory Visit (INDEPENDENT_AMBULATORY_CARE_PROVIDER_SITE_OTHER): Payer: Commercial Managed Care - HMO | Admitting: Internal Medicine

## 2014-05-28 ENCOUNTER — Encounter: Payer: Self-pay | Admitting: Internal Medicine

## 2014-05-28 VITALS — BP 130/80 | HR 63 | Temp 98.1°F | Ht 65.5 in | Wt 145.0 lb

## 2014-05-28 DIAGNOSIS — J449 Chronic obstructive pulmonary disease, unspecified: Secondary | ICD-10-CM

## 2014-05-28 DIAGNOSIS — J209 Acute bronchitis, unspecified: Secondary | ICD-10-CM

## 2014-05-28 DIAGNOSIS — I5022 Chronic systolic (congestive) heart failure: Secondary | ICD-10-CM

## 2014-05-28 DIAGNOSIS — I2510 Atherosclerotic heart disease of native coronary artery without angina pectoris: Secondary | ICD-10-CM

## 2014-05-28 NOTE — Progress Notes (Signed)
Pre visit review using our clinic review tool, if applicable. No additional management support is needed unless otherwise documented below in the visit note. 

## 2014-05-29 ENCOUNTER — Other Ambulatory Visit: Payer: Self-pay | Admitting: Cardiovascular Disease

## 2014-06-01 ENCOUNTER — Other Ambulatory Visit: Payer: Self-pay | Admitting: Cardiovascular Disease

## 2014-06-02 ENCOUNTER — Other Ambulatory Visit: Payer: Self-pay | Admitting: Internal Medicine

## 2014-06-03 ENCOUNTER — Encounter: Payer: Self-pay | Admitting: Internal Medicine

## 2014-06-03 NOTE — Progress Notes (Signed)
Subjective:    Patient ID: Sandra Ellison, female    DOB: 08/28/1945, 68 y.o.   MRN: [id number]  HPI 68 year old female with past history of cardiomyopathy with EF 35%, CHF, GERD, anemia (followed by Dr Ma Hillock), COPD and tobacco abuse.  She comes in today for follow up.   She was just admitted to the hospital 05/17/14.  Was diagnosed with acute on chronic systolic heart failure and COPD exacerbating.  Treated with IV lasix and prednisone.  See previous note for details.  Had referred her to the ER.  She left prior to being seen.  I placed her on Levaquin and a prednisone taper.  She was also given an nebulizer to have if needed.  Using inhalers.  Is much better now.  Breathing better.  Cough and congestion better.  Still with some cough.  Overall feels better.     Past Medical History  Diagnosis Date  . CHF (congestive heart failure)   . GERD (gastroesophageal reflux disease)   . COPD (chronic obstructive pulmonary disease)   . Hx of colonic polyps   . Iron deficiency anemia   . Chronic kidney disease (CKD), stage II (mild)   . Pulmonary HTN   . Arthritis   . Allergy   . History of blood transfusion   . Diverticulosis     H/O  . History of abnormal Pap smear     s/p vaginal hysterectomy    Outpatient Encounter Prescriptions as of 05/28/2014  Medication Sig  . albuterol (PROVENTIL HFA;VENTOLIN HFA) 108 (90 BASE) MCG/ACT inhaler Inhale 2 puffs into the lungs every 6 (six) hours as needed for wheezing.  Marland Kitchen aspirin 81 MG tablet Take 81 mg by mouth daily.    . carvedilol (COREG) 3.125 MG tablet Take 3.125 mg by mouth 2 (two) times daily with a meal.   . ferrous fumarate (HEMOCYTE - 106 MG FE) 325 (106 FE) MG TABS tablet Take 2 tablets by mouth daily.   . furosemide (LASIX) 20 MG tablet Take 1 tablet (20 mg total) by mouth daily as needed.  . gabapentin (NEURONTIN) 300 MG capsule TAKE 1 CAPSULE BY MOUTH 3 TIMES DAILY AS NEEDED.  Marland Kitchen levofloxacin (LEVAQUIN) 250 MG tablet Take 1 tablet (250  mg total) by mouth daily.  Marland Kitchen losartan (COZAAR) 25 MG tablet Take 1 tablet (25 mg total) by mouth daily.  . predniSONE (DELTASONE) 10 MG tablet Take 10 mg by mouth daily with breakfast.   . predniSONE (DELTASONE) 10 MG tablet Take 6 tablets x 1 day and then decrease by 1/2 tablet per day until down to zero mg.  . simvastatin (ZOCOR) 10 MG tablet TAKE 1 TABLET AT BEDTIME  . SPIRIVA HANDIHALER 18 MCG inhalation capsule PLACE 1 CAPSULE (18 MCG TOTAL) INTO INHALER AND INHALE AS NEEDED.  . [DISCONTINUED] pantoprazole (PROTONIX) 40 MG tablet TAKE 1 TABLET EVERY DAY    Review of Systems Patient denies any headache, lightheadedness or dizziness.  No sinus or allergy symptoms.  Improved cough and congestion. Breathing better.  Not as sob.  Taking her prednisone and abx.  Tolerating.  Using inhalers.  Not needing the nebulizer.   No nausea or vomiting.  No abdominal pain or cramping.  Acid controlled on protonix.   No bowel change, such as diarrhea, constipation, BRBPR or melana.           Objective:   Physical Exam  Filed Vitals:   05/28/14 1145  BP: 130/80  Pulse: 63  Temp:  98.1 F (53.51 C)   68 year old female in no acute distress.     HEENT:  Nares- clear.  Oropharynx - without lesions. NECK:  Supple.  Nontender.   HEART:  Appears to be regular. LUNGS:  Increased air movement.  No crackles.     RADIAL PULSE:  Decreased in right arm.  ABDOMEN:  Soft, nontender.  Bowel sounds present and normal.  No audible abdominal bruit.    EXTREMITIES:  No increased edema present.  DP pulses palpable and equal bilaterally.         Assessment & Plan:  1. Coronary artery disease involving native coronary artery of native heart without angina pectoris Stable.  Sees Dr Rockey Situ.  Recently evaluated.    2. Chronic systolic CHF (congestive heart failure) Breathing better after treatment for bronchitis.  No evidence of heart failure.    3. Chronic obstructive pulmonary disease, unspecified COPD, unspecified  chronic bronchitis type Breathing better.  Continue inhalers.  Continue abx and prednisone taper.  Continue inhalers.    4. Acute bronchitis, unspecified organism Continue treatment with abx and prednisone taper as outlined.  Doing better.  Breathing better.    5. VASCULAR.  Blood pressure lower in right arm compared to left.  States has been present for years.  No change.  States has been worked up previously.  Follow.    HEALTH MAINTENANCE.  Physical 05/16/13.  We postponed physical last visit.  Had recent mammogram 12/25/13 - Birads IV.  Saw Dr Bary Castilla.  Planning for 6 month f/u.  Have discussed need for colonoscopy.  She wanted to hold.

## 2014-06-11 ENCOUNTER — Other Ambulatory Visit: Payer: Self-pay | Admitting: *Deleted

## 2014-06-11 MED ORDER — CARVEDILOL 3.125 MG PO TABS: 3.1250 mg | ORAL_TABLET | Freq: Two times a day (BID) | ORAL | Status: DC

## 2014-06-15 DIAGNOSIS — I270 Primary pulmonary hypertension: Secondary | ICD-10-CM

## 2014-06-15 DIAGNOSIS — Z720 Tobacco use: Secondary | ICD-10-CM

## 2014-06-15 DIAGNOSIS — E785 Hyperlipidemia, unspecified: Secondary | ICD-10-CM | POA: Diagnosis not present

## 2014-06-15 DIAGNOSIS — J209 Acute bronchitis, unspecified: Secondary | ICD-10-CM

## 2014-06-15 DIAGNOSIS — J449 Chronic obstructive pulmonary disease, unspecified: Secondary | ICD-10-CM

## 2014-06-15 DIAGNOSIS — I5022 Chronic systolic (congestive) heart failure: Secondary | ICD-10-CM | POA: Diagnosis not present

## 2014-06-15 DIAGNOSIS — R0602 Shortness of breath: Secondary | ICD-10-CM | POA: Diagnosis not present

## 2014-06-18 ENCOUNTER — Other Ambulatory Visit: Payer: Self-pay | Admitting: Internal Medicine

## 2014-06-28 ENCOUNTER — Ambulatory Visit: Payer: Commercial Managed Care - HMO | Admitting: Cardiovascular Disease

## 2014-07-05 ENCOUNTER — Ambulatory Visit: Payer: Commercial Managed Care - HMO | Admitting: General Surgery

## 2014-07-09 ENCOUNTER — Encounter: Payer: Self-pay | Admitting: General Surgery

## 2014-07-09 ENCOUNTER — Ambulatory Visit: Payer: Self-pay | Admitting: General Surgery

## 2014-07-09 DIAGNOSIS — R928 Other abnormal and inconclusive findings on diagnostic imaging of breast: Secondary | ICD-10-CM | POA: Diagnosis not present

## 2014-07-09 DIAGNOSIS — R921 Mammographic calcification found on diagnostic imaging of breast: Secondary | ICD-10-CM | POA: Diagnosis not present

## 2014-07-23 ENCOUNTER — Ambulatory Visit: Payer: Commercial Managed Care - HMO | Admitting: General Surgery

## 2014-08-06 ENCOUNTER — Ambulatory Visit (INDEPENDENT_AMBULATORY_CARE_PROVIDER_SITE_OTHER): Payer: Commercial Managed Care - HMO | Admitting: General Surgery

## 2014-08-06 ENCOUNTER — Encounter: Payer: Self-pay | Admitting: General Surgery

## 2014-08-06 VITALS — BP 130/78 | HR 74 | Resp 14 | Ht 65.0 in | Wt 149.0 lb

## 2014-08-06 DIAGNOSIS — R920 Mammographic microcalcification found on diagnostic imaging of breast: Secondary | ICD-10-CM | POA: Diagnosis not present

## 2014-08-06 NOTE — Progress Notes (Signed)
Patient ID: Sandra Ellison, female   DOB: [birth year], 69 y.o.   MRN: [id number]  Chief Complaint  Patient presents with  . Follow-up    mammogram    HPI Sandra Ellison is a 69 y.o. female who presents for a breast evaluation. The most recent mammogram was done on 07/09/14.  Patient does perform regular self breast checks and gets regular mammograms done.    HPI  Past Medical History  Diagnosis Date  . CHF (congestive heart failure)   . GERD (gastroesophageal reflux disease)   . COPD (chronic obstructive pulmonary disease)   . Hx of colonic polyps   . Iron deficiency anemia   . Chronic kidney disease (CKD), stage II (mild)   . Pulmonary HTN   . Arthritis   . Allergy   . History of blood transfusion   . Diverticulosis     H/O  . History of abnormal Pap smear     s/p vaginal hysterectomy    Past Surgical History  Procedure Laterality Date  . Tonsillectomy  age 34  . Vaginal hysterectomy  1981    secondary to abnormal pap smear  . Orif distal radius fracture  2011    right  . Colonoscopy  10/2010    Family History  Problem Relation Age of Onset  . Arthritis Mother   . Alcohol abuse Father   . Arthritis Father   . Cancer Father     throat/spine    Social History History  Substance Use Topics  . Smoking status: Former Smoker -- 0.25 packs/day for 42 years    Types: Cigarettes  . Smokeless tobacco: Never Used     Comment: Quit early December 2015  . Alcohol Use: No    Allergies  Allergen Reactions  . Ranitidine Hcl Anaphylaxis and Swelling    Current Outpatient Prescriptions  Medication Sig Dispense Refill  . aspirin 81 MG tablet Take 81 mg by mouth daily.      . carvedilol (COREG) 12.5 MG tablet TAKE 1 TABLET TWICE A DAY WITH A MEAL (Patient taking differently: TAKE 1 TABLET daily) 60 tablet 3  . carvedilol (COREG) 3.125 MG tablet Take 1 tablet (3.125 mg total) by mouth 2 (two) times daily with a meal. (Patient taking differently: Take 3.125 mg by mouth  daily. ) 60 tablet 3  . ferrous fumarate (HEMOCYTE - 106 MG FE) 325 (106 FE) MG TABS tablet Take 2 tablets by mouth daily.     . furosemide (LASIX) 20 MG tablet Take 1 tablet (20 mg total) by mouth daily as needed. 30 tablet 6  . gabapentin (NEURONTIN) 100 MG capsule     . losartan (COZAAR) 25 MG tablet Take 1 tablet (25 mg total) by mouth daily. 90 tablet 3  . pantoprazole (PROTONIX) 40 MG tablet TAKE 1 TABLET EVERY DAY 30 tablet 3  . PROAIR HFA 108 (90 BASE) MCG/ACT inhaler INHALE 2 PUFFS INTO THE LUNGS EVERY 6 (SIX) HOURS AS NEEDED FOR WHEEZING. 8.5 each 2  . simvastatin (ZOCOR) 10 MG tablet TAKE 1 TABLET AT BEDTIME 90 tablet 1  . SPIRIVA HANDIHALER 18 MCG inhalation capsule PLACE 1 CAPSULE (18 MCG TOTAL) INTO INHALER AND INHALE AS NEEDED. 30 capsule 5   No current facility-administered medications for this visit.    Review of Systems Review of Systems  Constitutional: Negative.   Respiratory: Negative.   Cardiovascular: Negative.     Blood pressure 130/78, pulse 74, resp. rate 14, height 5\' 5"  (1.651 m), weight  149 lb (67.586 kg).  Physical Exam Physical Exam  Constitutional: She is oriented to person, place, and time. She appears well-developed and well-nourished.  Eyes: Conjunctivae are normal. No scleral icterus.  Neck: Neck supple.  Cardiovascular: Normal rate, regular rhythm and normal heart sounds.   Pulmonary/Chest: Effort normal. She has rhonchi in the right lower field. Right breast exhibits no inverted nipple, no mass, no nipple discharge, no skin change and no tenderness. Left breast exhibits no inverted nipple, no mass, no nipple discharge, no skin change and no tenderness.  Abdominal: Soft. Bowel sounds are normal. There is no tenderness.  Lymphadenopathy:    She has no cervical adenopathy.    She has no axillary adenopathy.  Neurological: She is alert and oriented to person, place, and time.  Skin: Skin is warm and dry.    Data Reviewed Right breast diagnostic  mammogram dated 07/09/2014 showed stable calcifications. BI-RADS 3 (previous BI-RADS-4.)  Assessment    Stable right breast microcalcifications.    Plan    The patient has been asked to return to the office in six months with a bilateral diagnostic mammogram. Discuss colonoscopy at next visit.      PCP:  Nash Shearer 08/07/2014, 8:40 PM

## 2014-08-09 ENCOUNTER — Encounter: Payer: Self-pay | Admitting: Internal Medicine

## 2014-08-09 ENCOUNTER — Ambulatory Visit (INDEPENDENT_AMBULATORY_CARE_PROVIDER_SITE_OTHER): Payer: Commercial Managed Care - HMO | Admitting: Internal Medicine

## 2014-08-09 VITALS — BP 110/80 | HR 75 | Temp 97.6°F | Ht 64.0 in | Wt 152.1 lb

## 2014-08-09 DIAGNOSIS — R920 Mammographic microcalcification found on diagnostic imaging of breast: Secondary | ICD-10-CM

## 2014-08-09 DIAGNOSIS — J449 Chronic obstructive pulmonary disease, unspecified: Secondary | ICD-10-CM

## 2014-08-09 DIAGNOSIS — Z720 Tobacco use: Secondary | ICD-10-CM | POA: Diagnosis not present

## 2014-08-09 DIAGNOSIS — D649 Anemia, unspecified: Secondary | ICD-10-CM

## 2014-08-09 DIAGNOSIS — K219 Gastro-esophageal reflux disease without esophagitis: Secondary | ICD-10-CM

## 2014-08-09 DIAGNOSIS — I2510 Atherosclerotic heart disease of native coronary artery without angina pectoris: Secondary | ICD-10-CM

## 2014-08-09 DIAGNOSIS — R0989 Other specified symptoms and signs involving the circulatory and respiratory systems: Secondary | ICD-10-CM | POA: Diagnosis not present

## 2014-08-09 DIAGNOSIS — Z86010 Personal history of colonic polyps: Secondary | ICD-10-CM

## 2014-08-09 DIAGNOSIS — Z0000 Encounter for general adult medical examination without abnormal findings: Secondary | ICD-10-CM

## 2014-08-09 DIAGNOSIS — E785 Hyperlipidemia, unspecified: Secondary | ICD-10-CM | POA: Diagnosis not present

## 2014-08-09 DIAGNOSIS — F17200 Nicotine dependence, unspecified, uncomplicated: Secondary | ICD-10-CM

## 2014-08-09 MED ORDER — GABAPENTIN 100 MG PO CAPS: 100.0000 mg | ORAL_CAPSULE | Freq: Three times a day (TID) | ORAL | Status: DC | PRN

## 2014-08-09 MED ORDER — PANTOPRAZOLE SODIUM 40 MG PO TBEC: 40.0000 mg | DELAYED_RELEASE_TABLET | Freq: Every day | ORAL | Status: DC

## 2014-08-09 MED ORDER — PREDNISONE 10 MG PO TABS: ORAL_TABLET | ORAL | Status: DC

## 2014-08-09 NOTE — Progress Notes (Signed)
Patient ID: Sandra Ellison, female   DOB: [birth year], 69 y.o.   MRN: [id number]   Subjective:    Patient ID: Sandra Ellison, female    DOB: 06-Nov-1945, 69 y.o.   MRN: [id number]  HPI  Patient here for a physical.  She states she has been doing relatively well.  Does report that starting last week, she developed some aching and headache.  These symptoms are better.  Some decreased appetite with increased cough and congestion.  Cough persists.  Taking mucinex.  Clear mucus.  Mainly just the cough persist.  No vomiting.  Bowels stable.  Smoking 3 cigarettes per day.  Discussed the need to quit.     Past Medical History  Diagnosis Date  . CHF (congestive heart failure)   . GERD (gastroesophageal reflux disease)   . COPD (chronic obstructive pulmonary disease)   . Hx of colonic polyps   . Iron deficiency anemia   . Chronic kidney disease (CKD), stage II (mild)   . Pulmonary HTN   . Arthritis   . Allergy   . History of blood transfusion   . Diverticulosis     H/O  . History of abnormal Pap smear     s/p vaginal hysterectomy     Current Outpatient Prescriptions on File Prior to Visit  Medication Sig Dispense Refill  . aspirin 81 MG tablet Take 81 mg by mouth daily.      . carvedilol (COREG) 3.125 MG tablet Take 1 tablet (3.125 mg total) by mouth 2 (two) times daily with a meal. (Patient taking differently: Take 3.125 mg by mouth daily. ) 60 tablet 3  . ferrous fumarate (HEMOCYTE - 106 MG FE) 325 (106 FE) MG TABS tablet Take 2 tablets by mouth daily.     . furosemide (LASIX) 20 MG tablet Take 1 tablet (20 mg total) by mouth daily as needed. 30 tablet 6  . losartan (COZAAR) 25 MG tablet Take 1 tablet (25 mg total) by mouth daily. 90 tablet 3  . PROAIR HFA 108 (90 BASE) MCG/ACT inhaler INHALE 2 PUFFS INTO THE LUNGS EVERY 6 (SIX) HOURS AS NEEDED FOR WHEEZING. 8.5 each 2  . simvastatin (ZOCOR) 10 MG tablet TAKE 1 TABLET AT BEDTIME 90 tablet 1  . SPIRIVA HANDIHALER 18 MCG inhalation  capsule PLACE 1 CAPSULE (18 MCG TOTAL) INTO INHALER AND INHALE AS NEEDED. 30 capsule 5   No current facility-administered medications on file prior to visit.    Review of Systems  Constitutional: Negative for fever and unexpected weight change.  HENT: Negative for sinus pressure.        Nasal congestion.    Respiratory: Positive for cough (increased cough and congestion.  ). Negative for chest tightness and shortness of breath.   Cardiovascular: Negative for chest pain, palpitations and leg swelling.  Gastrointestinal: Negative for nausea, vomiting and abdominal pain.  Genitourinary: Negative for dysuria and difficulty urinating.  Musculoskeletal: Negative for joint swelling and neck stiffness.  Skin: Negative for color change and rash.  Neurological: Negative for light-headedness and headaches.  Psychiatric/Behavioral: Negative for behavioral problems and agitation.       Objective:    Physical Exam  Constitutional: She is oriented to person, place, and time. She appears well-developed and well-nourished. No distress.  HENT:  Nose: Nose normal.  Mouth/Throat: Oropharynx is clear and moist.  Eyes: Right eye exhibits no discharge. Left eye exhibits no discharge. No scleral icterus.  Neck: Neck supple. No thyromegaly present.  Cardiovascular: Normal rate  and regular rhythm.   Pulmonary/Chest: Breath sounds normal. No accessory muscle usage. No tachypnea. No respiratory distress. She has no decreased breath sounds. She has no wheezes. She has no rhonchi. Right breast exhibits no inverted nipple, no mass, no nipple discharge and no tenderness (no axillary adenopathy). Left breast exhibits no inverted nipple, no mass, no nipple discharge and no tenderness (no axilarry adenopathy).  Abdominal: Soft. Bowel sounds are normal. There is no tenderness.  Musculoskeletal: She exhibits no edema or tenderness.  Lymphadenopathy:    She has no cervical adenopathy.  Neurological: She is alert and  oriented to person, place, and time.  Skin: Skin is warm. No rash noted. No erythema.  Psychiatric: She has a normal mood and affect. Her behavior is normal.    BP 110/80 mmHg  Pulse 75  Temp(Src) 97.6 F (36.4 C) (Oral)  Ht 5\' 4"  (1.626 m)  Wt 152 lb 2 oz (69.003 kg)  BMI 26.10 kg/m2  SpO2 96% Wt Readings from Last 3 Encounters:  08/09/14 152 lb 2 oz (69.003 kg)  08/06/14 149 lb (67.586 kg)  05/28/14 145 lb (65.772 kg)     Lab Results  Component Value Date   WBC 5.1 10/16/2013   HGB 12.7 10/16/2013   HCT 38.7 10/16/2013   PLT 226.0 10/16/2013   GLUCOSE 68* 04/05/2014   CHOL 127 04/05/2014   TRIG 68.0 04/05/2014   HDL 33.30* 04/05/2014   LDLCALC 80 04/05/2014   ALT 14 04/05/2014   AST 20 04/05/2014   NA 138 04/05/2014   K 4.7 04/05/2014   CL 104 04/05/2014   CREATININE 1.4* 04/05/2014   BUN 23 04/05/2014   CO2 26 04/05/2014   TSH 3.53 07/31/2013       Assessment & Plan:   Problem List Items Addressed This Visit    Anemia    Follow cbc.       Relevant Orders   TSH   CAD (coronary artery disease) - Primary    Sees cardiology.  Currently without symptoms.  Follow.   Continue risk factor modifications.        Relevant Orders   Basic metabolic panel   Carotid bruit    States cardiology follows.        COPD (chronic obstructive pulmonary disease)    Symptoms and exam as outlined.  I do not feel abx are warranted.  Treat with prednisone taper as directed. Saline nasal spray and nasacort as directed.  Robitussin as directed.  Follow.  Notify me or be reevaluated if symptoms worsen or do not improve.        Relevant Medications   predniSONE (DELTASONE) tablet   GERD (gastroesophageal reflux disease)    EGD as outlined.  Symptoms controlled on protonix.  Follow.        Relevant Medications   pantoprazole (PROTONIX) EC tablet   Health care maintenance    Physical today.  Mammograms followed by Dr Levonne Spiller.  Planning to f/u with him about her  colonoscopy.        History of colonic polyps    Had colonoscopy in 10/2010.  Overdue.  She plans to discuss this more with Dr Bary Castilla at next visit.        Hyperlipidemia    Low cholesterol diet and exercise.  Follow lipid panel and liver function tests.  Remains on simvastatin.        Relevant Orders   Hepatic function panel   Lipid panel   Microcalcifications of the breast  Saw Dr Collene Schlichter.  He is following her mammograms.  Due f/u in 6 months from last check.        Smoking    Again discussed the need for her to quit.  She desires to continue to smoke.          I spent 25 minutes with the patient and more than 50% of the time was spent in consultation regarding the above.     Einar Pheasant, MD

## 2014-08-09 NOTE — Progress Notes (Signed)
Pre visit review using our clinic review tool, if applicable. No additional management support is needed unless otherwise documented below in the visit note. 

## 2014-08-12 ENCOUNTER — Encounter: Payer: Self-pay | Admitting: Internal Medicine

## 2014-08-12 DIAGNOSIS — Z0000 Encounter for general adult medical examination without abnormal findings: Secondary | ICD-10-CM | POA: Insufficient documentation

## 2014-08-12 NOTE — Assessment & Plan Note (Signed)
Follow cbc.  

## 2014-08-12 NOTE — Assessment & Plan Note (Signed)
Had colonoscopy in 10/2010.  Overdue.  She plans to discuss this more with Dr Bary Castilla at next visit.

## 2014-08-12 NOTE — Assessment & Plan Note (Signed)
Low cholesterol diet and exercise.  Follow lipid panel and liver function tests.  Remains on simvastatin.

## 2014-08-12 NOTE — Assessment & Plan Note (Signed)
Saw Dr Collene Schlichter.  He is following her mammograms.  Due f/u in 6 months from last check.

## 2014-08-12 NOTE — Assessment & Plan Note (Signed)
EGD as outlined.  Symptoms controlled on protonix.  Follow.

## 2014-08-12 NOTE — Assessment & Plan Note (Signed)
Physical today.  Mammograms followed by Dr Levonne Spiller.  Planning to f/u with him about her colonoscopy.

## 2014-08-12 NOTE — Assessment & Plan Note (Signed)
States cardiology follows.

## 2014-08-12 NOTE — Assessment & Plan Note (Signed)
Symptoms and exam as outlined.  I do not feel abx are warranted.  Treat with prednisone taper as directed. Saline nasal spray and nasacort as directed.  Robitussin as directed.  Follow.  Notify me or be reevaluated if symptoms worsen or do not improve.

## 2014-08-12 NOTE — Assessment & Plan Note (Signed)
Again discussed the need for her to quit.  She desires to continue to smoke.

## 2014-08-12 NOTE — Assessment & Plan Note (Signed)
Sees cardiology.  Currently without symptoms.  Follow.   Continue risk factor modifications.

## 2014-08-15 ENCOUNTER — Other Ambulatory Visit: Payer: Self-pay | Admitting: Internal Medicine

## 2014-08-23 ENCOUNTER — Telehealth: Payer: Self-pay | Admitting: *Deleted

## 2014-08-23 NOTE — Telephone Encounter (Signed)
Pt called states she is still having congestion and cough, pt further states she has completed her Prednisone.  Please advise

## 2014-08-23 NOTE — Telephone Encounter (Signed)
Called pt and left message, offering appt at 8:30 tomorrow, requested call back if she can be here or not.

## 2014-08-23 NOTE — Telephone Encounter (Signed)
If persistent symptoms, needs reevaluation.  I can see her tomorrow at 10:00 (block the 30 min spot) (if that pt is not coming).  If the 10:00 pt is coming - then have this pt come at 12:00.  Working in.

## 2014-08-23 NOTE — Telephone Encounter (Signed)
Pt called back and confirmed she could be seen.

## 2014-08-24 ENCOUNTER — Encounter: Payer: Self-pay | Admitting: Internal Medicine

## 2014-08-24 ENCOUNTER — Ambulatory Visit (INDEPENDENT_AMBULATORY_CARE_PROVIDER_SITE_OTHER): Payer: Commercial Managed Care - HMO | Admitting: Internal Medicine

## 2014-08-24 VITALS — BP 134/60 | HR 77 | Temp 98.2°F | Resp 14 | Ht 64.0 in | Wt 155.8 lb

## 2014-08-24 DIAGNOSIS — J209 Acute bronchitis, unspecified: Secondary | ICD-10-CM

## 2014-08-24 DIAGNOSIS — Z720 Tobacco use: Secondary | ICD-10-CM

## 2014-08-24 DIAGNOSIS — J449 Chronic obstructive pulmonary disease, unspecified: Secondary | ICD-10-CM

## 2014-08-24 DIAGNOSIS — R05 Cough: Secondary | ICD-10-CM

## 2014-08-24 DIAGNOSIS — F17200 Nicotine dependence, unspecified, uncomplicated: Secondary | ICD-10-CM

## 2014-08-24 DIAGNOSIS — R059 Cough, unspecified: Secondary | ICD-10-CM

## 2014-08-24 MED ORDER — CEFDINIR 300 MG PO CAPS: 300.0000 mg | ORAL_CAPSULE | Freq: Two times a day (BID) | ORAL | Status: DC

## 2014-08-24 MED ORDER — PREDNISONE 10 MG PO TABS: ORAL_TABLET | ORAL | Status: DC

## 2014-08-24 NOTE — Progress Notes (Signed)
Pre visit review using our clinic review tool, if applicable. No additional management support is needed unless otherwise documented below in the visit note. 

## 2014-08-24 NOTE — Progress Notes (Signed)
Patient ID: Sandra Ellison, female   DOB: [birth year], 69 y.o.   MRN: [id number]   Subjective:    Patient ID: Sandra Ellison, female    DOB: 1945-07-20, 69 y.o.   MRN: [id number]  HPI  Patient here as a work in with persistent cough and congestion.  Was seen 08/09/14 and treated with prednisone and robitussin.  She was instructed to continue her inhaler.  She comes in today stating that her symptoms have improved, but still with persistent cough. Now with increased congestion and cough.  Increased muscle soreness from coughing.  Some fatigue.  Taking mucinex.  Is eating.    Past Medical History  Diagnosis Date  . CHF (congestive heart failure)   . GERD (gastroesophageal reflux disease)   . COPD (chronic obstructive pulmonary disease)   . Hx of colonic polyps   . Iron deficiency anemia   . Chronic kidney disease (CKD), stage II (mild)   . Pulmonary HTN   . Arthritis   . Allergy   . History of blood transfusion   . Diverticulosis     H/O  . History of abnormal Pap smear     s/p vaginal hysterectomy     Current Outpatient Prescriptions on File Prior to Visit  Medication Sig Dispense Refill  . aspirin 81 MG tablet Take 81 mg by mouth daily.      . carvedilol (COREG) 3.125 MG tablet Take 1 tablet (3.125 mg total) by mouth 2 (two) times daily with a meal. (Patient taking differently: Take 3.125 mg by mouth daily. ) 60 tablet 3  . ferrous fumarate (HEMOCYTE - 106 MG FE) 325 (106 FE) MG TABS tablet Take 2 tablets by mouth daily.     Marland Kitchen gabapentin (NEURONTIN) 100 MG capsule Take 1 capsule (100 mg total) by mouth 3 (three) times daily as needed. 90 capsule 1  . gabapentin (NEURONTIN) 300 MG capsule TAKE 1 CAPSULE BY MOUTH 3 TIMES DAILY AS NEEDED. 90 capsule 1  . losartan (COZAAR) 25 MG tablet Take 1 tablet (25 mg total) by mouth daily. 90 tablet 3  . pantoprazole (PROTONIX) 40 MG tablet Take 1 tablet (40 mg total) by mouth daily. 30 tablet 3  . simvastatin (ZOCOR) 10 MG tablet TAKE 1 TABLET  AT BEDTIME 90 tablet 1  . SPIRIVA HANDIHALER 18 MCG inhalation capsule PLACE 1 CAPSULE (18 MCG TOTAL) INTO INHALER AND INHALE AS NEEDED. 30 capsule 5  . furosemide (LASIX) 20 MG tablet Take 1 tablet (20 mg total) by mouth daily as needed. (Patient not taking: Reported on 08/24/2014) 30 tablet 6  . PROAIR HFA 108 (90 BASE) MCG/ACT inhaler INHALE 2 PUFFS INTO THE LUNGS EVERY 6 (SIX) HOURS AS NEEDED FOR WHEEZING. (Patient not taking: Reported on 08/24/2014) 8.5 each 2   No current facility-administered medications on file prior to visit.    Review of Systems  Constitutional: Negative for appetite change and unexpected weight change.  HENT: Positive for congestion. Negative for sinus pressure.   Respiratory: Positive for cough (with chest congestion.  productive cough.  ), shortness of breath (some sob with cough.  ) and wheezing. Negative for chest tightness.   Cardiovascular: Negative for chest pain, palpitations and leg swelling.  Gastrointestinal: Negative for nausea, vomiting and diarrhea.  Neurological: Negative for dizziness, light-headedness and headaches.       Objective:    Physical Exam  HENT:  Mouth/Throat: Oropharynx is clear and moist.  Slightly erythematous turbinates.  No significant tenderness to palpation over  the sinuses.    Neck: Neck supple.  Cardiovascular: Normal rate and regular rhythm.   Pulmonary/Chest: Breath sounds normal. No respiratory distress.  Increased cough with forced expiration.  Some increased congestion.  Cleared with coughing.    Lymphadenopathy:    She has no cervical adenopathy.    BP 134/60 mmHg  Pulse 77  Temp(Src) 98.2 F (36.8 C) (Oral)  Resp 14  Ht 5\' 4"  (1.626 m)  Wt 155 lb 12.8 oz (70.67 kg)  BMI 26.73 kg/m2  SpO2 98% Wt Readings from Last 3 Encounters:  08/24/14 155 lb 12.8 oz (70.67 kg)  08/09/14 152 lb 2 oz (69.003 kg)  08/06/14 149 lb (67.586 kg)     Lab Results  Component Value Date   WBC 8.9 08/30/2014   HGB 12.1  08/30/2014   HCT 36.0 08/30/2014   PLT 365.0 08/30/2014   GLUCOSE 130* 08/30/2014   CHOL 179 08/30/2014   TRIG 98.0 08/30/2014   HDL 49.60 08/30/2014   LDLCALC 110* 08/30/2014   ALT 20 08/30/2014   AST 18 08/30/2014   NA 137 08/30/2014   K 4.7 08/30/2014   CL 105 08/30/2014   CREATININE 1.20 08/30/2014   BUN 19 08/30/2014   CO2 27 08/30/2014   TSH 3.11 08/30/2014       Assessment & Plan:   Problem List Items Addressed This Visit    Acute bronchitis    Return of increased cough and congestion.  Continues to smoke.  Continues with intermittent flares.  Treat with prednisone taper as directed.  omnicef as directed.  Mucinex in the am and robitussin in the evening.  Rest.  Fluids.        COPD (chronic obstructive pulmonary disease) - Primary    Symptoms and exam as outlined.  Persistent intermittent flares.  Treat acute infection as outlined.  Continue inhalers as she is doing.  Refer to pulmonary for further evaluation and treatment.        Relevant Medications   predniSONE (DELTASONE) tablet   Other Relevant Orders   Ambulatory referral to Pulmonology   Smoking    Discussed the need for smoking cessation.  She continues to smoke.         Other Visit Diagnoses    Cough        Relevant Orders    Ambulatory referral to Pulmonology        Einar Pheasant, MD

## 2014-08-24 NOTE — Patient Instructions (Signed)
Saline nasal spray - flush nose at least 2-3x/day  nasacort nasal spray - 2 sprays each nostril one time per day.  Do this in the evening.   Continue mucinex.    Take the prednisone and the antibiotic as directed.    Take align (a probiotic) one per day while on the antibiotic and for two more weeks after you complete the antibiotic.

## 2014-08-30 ENCOUNTER — Other Ambulatory Visit (INDEPENDENT_AMBULATORY_CARE_PROVIDER_SITE_OTHER): Payer: Commercial Managed Care - HMO

## 2014-08-30 DIAGNOSIS — E785 Hyperlipidemia, unspecified: Secondary | ICD-10-CM | POA: Diagnosis not present

## 2014-08-30 DIAGNOSIS — D649 Anemia, unspecified: Secondary | ICD-10-CM | POA: Diagnosis not present

## 2014-08-30 DIAGNOSIS — I2510 Atherosclerotic heart disease of native coronary artery without angina pectoris: Secondary | ICD-10-CM

## 2014-08-30 LAB — BASIC METABOLIC PANEL
BUN: 19 mg/dL (ref 6–23)
CALCIUM: 8.7 mg/dL (ref 8.4–10.5)
CO2: 27 mEq/L (ref 19–32)
CREATININE: 1.2 mg/dL (ref 0.40–1.20)
Chloride: 105 mEq/L (ref 96–112)
GFR: 47.44 mL/min — AB (ref >60.00)
Glucose, Bld: 130 mg/dL — ABNORMAL HIGH (ref 70–99)
Potassium: 4.7 mEq/L (ref 3.5–5.1)
Sodium: 137 mEq/L (ref 135–145)

## 2014-08-30 LAB — HEPATIC FUNCTION PANEL
ALT: 20 U/L (ref 0–35)
AST: 18 U/L (ref 0–37)
Albumin: 3.4 g/dL — ABNORMAL LOW (ref 3.5–5.2)
Alkaline Phosphatase: 83 U/L (ref 39–117)
BILIRUBIN DIRECT: 0.1 mg/dL (ref 0.0–0.3)
Total Bilirubin: 0.4 mg/dL (ref 0.2–1.2)
Total Protein: 6 g/dL (ref 6.0–8.3)

## 2014-08-30 LAB — LIPID PANEL
CHOL/HDL RATIO: 4
Cholesterol: 179 mg/dL (ref 0–200)
HDL: 49.6 mg/dL (ref >39.00)
LDL Cholesterol: 110 mg/dL — ABNORMAL HIGH (ref 0–99)
NONHDL: 129.4
TRIGLYCERIDES: 98 mg/dL (ref 0.0–149.0)
VLDL: 19.6 mg/dL (ref 0.0–40.0)

## 2014-08-30 LAB — CBC WITH DIFFERENTIAL/PLATELET
BASOS PCT: 0 % (ref 0.0–3.0)
Basophils Absolute: 0 10*3/uL (ref 0.0–0.1)
EOS ABS: 0 10*3/uL (ref 0.0–0.7)
Eosinophils Relative: 0.2 % (ref 0.0–5.0)
HEMATOCRIT: 36 % (ref 36.0–46.0)
HEMOGLOBIN: 12.1 g/dL (ref 12.0–15.0)
Lymphocytes Relative: 5.6 % — ABNORMAL LOW (ref 12.0–46.0)
Lymphs Abs: 0.5 10*3/uL — ABNORMAL LOW (ref 0.7–4.0)
MCHC: 33.7 g/dL (ref 30.0–36.0)
MCV: 93.3 fl (ref 78.0–100.0)
MONO ABS: 0.1 10*3/uL (ref 0.1–1.0)
Monocytes Relative: 1.6 % — ABNORMAL LOW (ref 3.0–12.0)
NEUTROS ABS: 8.2 10*3/uL — AB (ref 1.4–7.7)
Neutrophils Relative %: 92.6 % — ABNORMAL HIGH (ref 43.0–77.0)
Platelets: 365 10*3/uL (ref 150.0–400.0)
RBC: 3.86 Mil/uL — AB (ref 3.87–5.11)
RDW: 14.5 % (ref 11.5–15.5)
WBC: 8.9 10*3/uL (ref 4.0–10.5)

## 2014-08-30 LAB — FERRITIN: Ferritin: 244.4 ng/mL (ref 10.0–291.0)

## 2014-08-30 LAB — TSH: TSH: 3.11 u[IU]/mL (ref 0.35–4.50)

## 2014-08-31 ENCOUNTER — Other Ambulatory Visit: Payer: Commercial Managed Care - HMO

## 2014-09-01 ENCOUNTER — Other Ambulatory Visit: Payer: Self-pay | Admitting: Internal Medicine

## 2014-09-01 DIAGNOSIS — D72829 Elevated white blood cell count, unspecified: Secondary | ICD-10-CM

## 2014-09-01 NOTE — Progress Notes (Signed)
Order placed for f/u cbc.   

## 2014-09-02 ENCOUNTER — Encounter: Payer: Self-pay | Admitting: Internal Medicine

## 2014-09-02 NOTE — Assessment & Plan Note (Signed)
Return of increased cough and congestion.  Continues to smoke.  Continues with intermittent flares.  Treat with prednisone taper as directed.  omnicef as directed.  Mucinex in the am and robitussin in the evening.  Rest.  Fluids.

## 2014-09-02 NOTE — Assessment & Plan Note (Signed)
Symptoms and exam as outlined.  Persistent intermittent flares.  Treat acute infection as outlined.  Continue inhalers as she is doing.  Refer to pulmonary for further evaluation and treatment.

## 2014-09-02 NOTE — Assessment & Plan Note (Signed)
Discussed the need for smoking cessation.  She continues to smoke.

## 2014-09-03 ENCOUNTER — Encounter: Payer: Self-pay | Admitting: *Deleted

## 2014-09-06 ENCOUNTER — Encounter: Payer: Self-pay | Admitting: Internal Medicine

## 2014-09-14 ENCOUNTER — Telehealth: Payer: Self-pay | Admitting: *Deleted

## 2014-09-14 NOTE — Telephone Encounter (Signed)
Pt has a appt at Dr. Augustina Mood office on 09/19/14. Pt has [place]. Needs a referral . Dr. Augustina Mood NPI # [id number]. ICD 10 code J44.9 ( COPD)

## 2014-09-14 NOTE — Telephone Encounter (Signed)
The patient has a [employer] referral in place for 6 visits.

## 2014-09-19 ENCOUNTER — Institutional Professional Consult (permissible substitution): Payer: Commercial Managed Care - HMO | Admitting: Internal Medicine

## 2014-09-24 ENCOUNTER — Other Ambulatory Visit: Payer: Self-pay | Admitting: Internal Medicine

## 2014-09-25 NOTE — Discharge Summary (Signed)
PATIENT NAME:  Sandra Ellison, Sandra Ellison MR#:  [id number] DATE OF BIRTH:  28-May-1946  DATE OF ADMISSION:  02/06/2012 DATE OF DISCHARGE:  02/07/2012  PRIMARY ONCOLOGIST: Leia Alf, MD  PRESENTING COMPLAINT: Dizziness.   DISCHARGE DIAGNOSES:  1. Acute on chronic renal failure secondary to dehydration, improved after IV fluids.  2. Chronic anemia status post IV iron treatment at the [facility].  3. Cardiomyopathy, ejection fraction around 35%.   CODE STATUS: FULL CODE.   MEDICATIONS:  1. Ferrous sulfate 325 mg p.o. daily.  2. Simvastatin 20 mg at bedtime.  3. Aspirin 81 mg daily.  4. Flonase 50 mcg one spray to each nostril daily as needed. 5. Protonix 40 mg daily.  6. Docusate 100 mg daily.  7. Gabapentin 300 mg twice a day.  8. Benadryl 25 mg two tablets at bedtime.   NOTE: The patient is recommended not to take Lasix unless starts to get ankle edema. The patient is also advised not to take lisinopril.   DIET: 2 gram sodium diet.  DISCHARGE FOLLOWUP: Followup with Dr. Leia Alf on your scheduled appointment.  Followup with Dr. Rockey Situ in 1 to 2 weeks.   LABS/RADIOLOGIC STUDIES: Creatinine on discharge was 2.18, sodium 142, potassium 4.8, chloride 113, and bicarbonate 21.   Echo Doppler: LV function improved since last time, left ventricle is mildly dilated to 30 to 35%, severe inferior wall hypokinesis, left atrium is mildly dilated, and right ventricular pressure is elevated at 40 to 50.  Hemoglobin and hematocrit is 10.7 and 31.9.   Urinalysis is negative for urinary tract infection.   Magnesium was 1.8. Repeat hemoglobin and hematocrit were 7.8 and 23.1. Creatinine was 3.50 at admission.   BRIEF SUMMARY OF HOSPITAL COURSE: Sandra Ellison is a 69 year old Caucasian female with history of chronic anemia who gets IV iron treatment at the [facility], history of cardiomyopathy with ejection fraction around 35%, and chronic smoker who comes to the Emergency Room after she  became dizzy after a long day in the [place] after getting IV iron therapy. She was admitted with:  1. Acute on chronic renal failure, possibly secondary to dehydration in the setting of taking diuretics. The patient's baseline creatinine is around 1.8. Her creatinine went up to 3.50. She was given some IV fluids cautiously, encouraged p.o. fluids, and her Lasix and lisinopril were held. The patient's creatinine was down to 2 prior to discharge. She will hold off on her Lasix and lisinopril for now. She will follow up with Dr. Rockey Situ and Dr. Ma Hillock as an outpatient.  2. Hyperkalemia in the setting of acute renal failure, resolved after IV fluids. No EKG changes were noted. The patient received Kayexalate in the Emergency Room.  3. Chronic anemia, iron deficiency. She follows with Dr. Ma Hillock and receives IV iron as an outpatient. Her hemoglobin was 10.7. Repeat hemoglobin was 7.8. I am not sure if that was the correct hemoglobin. However, there was no indication for blood transfusion.  4. Chronic smoker. Advised cessation. However, she is not ready to quit, but is agreeable to cutting down on the number of cigarettes smoked.            Her overall hospital stay remained stable. The patient remained a FULL CODE.   TIME SPENT: 40 minutes.  ____________________________ Hart Rochester Posey Pronto, MD sap:slb D: 02/08/2012 06:44:28 ET T: 02/09/2012 12:36:12 ET JOB#: [id number]  cc: Martha Soltys A. Posey Pronto, MD, <Dictator> Sandeep R. Ma Hillock, MD Minna Merritts, MD Ilda Basset MD ELECTRONICALLY  SIGNED 02/09/2012 15:46

## 2014-09-25 NOTE — H&P (Signed)
PATIENT NAME:  Sandra Ellison, Sandra Ellison MR#:  [id number] DATE OF BIRTH:  1945-11-27  DATE OF ADMISSION:  02/05/2012  CARDIOLOGIST: Dr Rockey Situ. ONCOLOGIST: Dr Ma Hillock.  PRESENTING COMPLAINT: Feeling dizzy.    HISTORY OF PRESENTING ILLNESS: The patient is a 69 year old female with past medical history is of mild renal insufficiency, CHF, and chronic anemia.  She also has hypertension and she is a chronic smoker.  For her chronic anemia she is following with Dr Ma Hillock, hematologist;  and she is receiving IV iron therapy repeatedly for that.  Today she came to receive her IV iron therapy in his clinic.  For her CHF she is taking Lasix 20 mg orally with potassium pills every alternate day.  Since she had to come to the clinic today so instead of today she took the dose yesterday of her Lasix and potassium to avoid excessive urination on the day of clinic visit.  She is here in the clinic since 9 a.m. in the morning, and she just ate a little breakfast before she left, and she did not have enough liquids to drink throughout the day compared to her usual day.  So after finishing her IV iron therapy, in the afternoon hours she felt a little dizzy while sitting in the office, and other patients noticed it and brought it to the attention of Dr Ma Hillock.  They measured her blood pressure, made her sit comfortably in the chair; and after sitting for a while she felt a little better.  They advised her to come to the emergency room, and that is why she did so.  On examination in the room right now she is feeling fine.  She said she tried to get up suddenly, and that is why she felt dizzy in the doctor's office but denies any chest pain, palpitations, short of breath, any loss of balance or episode of losing consciousness, any tingling or numbness or focal weakness.  She denies any blackout symptoms, and she denies any seizure episode.  She denies any complaint of palpitation or any feeling of missing beats.  She denies any short of  breath or swelling of the limbs.  She denies any similar episode in the past.  After coming to ER she was noticed to have worsening of her renal function and having hypokalemia but there were no EKG changes.  She was given Kayexalate and she had her dinner.  With that she had 2 glasses of water, and she is feeling fine after having her dinner and drinking water.    REVIEW OF SYSTEMS: CONSTITUTIONAL: Denies fever, fatigue, weakness, pain.  EYES: Denies any blurring or double vision or pain or discharge from the eyes.  ENT: Denies any tinnitus, ear pain, or hearing loss, or discharge from the ears.  RESPIRATORY: Denies any cough, wheeze, hemoptysis, dyspnea, or chest pain.  CARDIOVASCULAR: Denies chest pain, orthopnea, edema, or palpitation.  GI: Denies any nausea, vomiting, diarrhea, or abdominal pain, hematemesis, or any blood in the stool.  GENITOURINARY: Denies any dysuria, increased frequency, or any hematuria.  HEMATOLOGY: She has history of anemia but denies any easy bruising or bleeding. SKIN: Denies any rashes or acne.  MUSCULOSKELETAL: Denies any pain in any places or any joints or swelling. NEUROLOGIC: Denies numbness, weakness, dysarthria, loss of balance or loss of consciousness.  PSYCHIATRY: Denies anxiety, insomnia, bipolar, or schizo disorder.    PAST MEDICAL HISTORY: Hypertension, CHF, ejection fraction 83%, systolic heart failure as found from previous echocardiogram reports in the system, mild renal  failure, chronic anemia, iron deficiency.    PAST SURGICAL HISTORY: D and C, tonsillectomy, right wrist surgery after fracture.     ALLERGIES: Zantac.    FAMILY HISTORY: Cancer in father of throat cancer, and grandfather had diabetes mellitus.  SOCIAL HISTORY: Retired, was working in [employer].  She is a chronic smoker, smokes 1/2 pack every day for many years now.  Social drinker which she stopped a few years ago.  Denies any illegal drug use.    HOME MEDICATIONS:  1. Pantoprazole  40 mg oral daily. 2. Ferrous sulfate 325 mg once a day. 3. Simvastatin 20 mg 1 tablet once a day. 4. Aspirin 81 mg once a day. 5. Coreg 12.5 mg 2 times a day. 6. Tramadol 50 mg oral tablet 2 times a day as needed for knee pain. 7. Lisinopril 5 mg oral once a day. 8. Potassium chloride 20 mEq oral once a day. 9. Flonase 50 mcg inhalation nasal spray once a day as needed.  PHYSICAL EXAMINATION:   VITAL SIGNS: Blood pressure 130/92, heart rate 78, respirations 18, SPO2 94% on room air, temperature 97.8.  GENERAL EXAMINATION: Is fully alert, oriented, not in acute distress.    HEAD AND NECK: Conjunctivae are pale.  Oral mucosa is dry.  Not any sign of trauma.    NECK: No JVD, no lymph nodes palpable.  RESPIRATORY: Bilateral clear and equal air entry, no rales or ronchi.    CVS: S1 and S2 normal, regular rate and rhythm.  No murmurs.  No edema on the legs.      SKIN: No rash.  ABDOMEN: Nontender.  Bowel sounds present.  No organomegaly appreciated.      NEUROLOGIC: Cranial nerves grossly intact.  Follows command. Power 5/5 all 4 limbs.  Sensation intact.    LABORATORY RESULTS: Glucose 93, BUN 44, creatinine 2.96, sodium 137, potassium 5.6, chloride 108, CO2 of 25, calcium 8.3, total protein 6.7, albumin 3.1.  Bilirubin 0.2, alkaline phosphatase 118, SGOT 19, SGPT 10.  WBC 8, RBC 3.28, hemoglobin 10.7, hematocrit 31.9, platelet count 316,000.  Urinalysis: 5 WBCs but negative for nitrite and trace leukocyte esterase.    ASSESSMENT: A 69 year old female with past medical history of congestive heart failure, chronic smoker, chronic anemia, became dizzy today after a long day in the clinic for getting IV iron and not had enough food or liquid intake throughout the day.  PLAN: 1. Acute renal failure, possibly secondary to dehydration.  She already drank 2 glasses of water in the ER and blood pressure is now acceptable.  In view of her history of CHF, having 25% ejection fraction, we will not  give any IV fluids right now but we will monitor BMP tomorrow morning. If renal function does not improve she might need nephrology consult.  We will monitor her on telemetry. 2. Hyperkalemia, possibly secondary to acute renal failure and because she is taking potassium supplements.  She received Kayexalate in the ER, and we will follow tomorrow morning.  No EKG changes at this point. 3. Chronic anemia, iron deficiency.  She is following with hematologist for that and received IV iron therapy today, stable. 4. Chronic smoker.  Counseling is done for smoking cessation approximately 4 minutes.  She is not ready to quit smoking but she agrees for cutting down the number of cigarettes smoked per day. 5. Deep venous thrombosis prophylaxis with heparin subcutaneous, gastrointestinal prophylaxis with pantoprazole.    TOTAL TIME SPENT: 50 minutes.    We will  keep her on observation for any abnormal cardiac event and for followup of her BMP, especially for renal failure and for potassium.   ____________________________ Ceasar Lund Anselm Jungling, MD vgv:vtd D: 02/05/2012 21:18:08 ET T: 02/06/2012 07:49:09 ET JOB#: [id number]  cc: Ceasar Lund. Anselm Jungling, MD, <Dictator> Vaughan Basta MD ELECTRONICALLY SIGNED 02/28/2012 17:49

## 2014-09-27 ENCOUNTER — Other Ambulatory Visit (INDEPENDENT_AMBULATORY_CARE_PROVIDER_SITE_OTHER): Payer: Commercial Managed Care - HMO

## 2014-09-27 DIAGNOSIS — D72829 Elevated white blood cell count, unspecified: Secondary | ICD-10-CM | POA: Diagnosis not present

## 2014-09-27 LAB — CBC WITH DIFFERENTIAL/PLATELET
Basophils Absolute: 0 10*3/uL (ref 0.0–0.1)
Basophils Relative: 0.5 % (ref 0.0–3.0)
EOS PCT: 2.5 % (ref 0.0–5.0)
Eosinophils Absolute: 0.2 10*3/uL (ref 0.0–0.7)
HCT: 37.6 % (ref 36.0–46.0)
HEMOGLOBIN: 12.7 g/dL (ref 12.0–15.0)
LYMPHS PCT: 19.3 % (ref 12.0–46.0)
Lymphs Abs: 1.5 10*3/uL (ref 0.7–4.0)
MCHC: 33.9 g/dL (ref 30.0–36.0)
MCV: 93.3 fl (ref 78.0–100.0)
Monocytes Absolute: 0.8 10*3/uL (ref 0.1–1.0)
Monocytes Relative: 10.2 % (ref 3.0–12.0)
NEUTROS ABS: 5.4 10*3/uL (ref 1.4–7.7)
NEUTROS PCT: 67.5 % (ref 43.0–77.0)
Platelets: 362 10*3/uL (ref 150.0–400.0)
RBC: 4.03 Mil/uL (ref 3.87–5.11)
RDW: 14.1 % (ref 11.5–15.5)
WBC: 8 10*3/uL (ref 4.0–10.5)

## 2014-09-28 ENCOUNTER — Encounter: Payer: Self-pay | Admitting: *Deleted

## 2014-09-29 NOTE — H&P (Signed)
PATIENT NAME:  Sandra Ellison, Sandra Ellison MR#:  [id number] DATE OF BIRTH:  1945-11-11  DATE OF ADMISSION:  05/17/2014  REFERRING PHYSICIAN:  Valli Glance. Owens Shark, MD  PRIMARY CARE PHYSICIAN:  Einar Pheasant, MD   ADMITTING PHYSICIAN:  Juluis Mire, MD   CHIEF COMPLAINT:  Shortness of breath for the past 4 to 5 days.   HISTORY OF PRESENT ILLNESS:  This is a 69 year old Caucasian female with a past medical history of systolic congestive heart failure with ejection fraction of 30 to 35%, history of COPD, chronic iron deficiency anemia, chronic kidney disease, hiatus hernia, neuropathy, and history of hyperlipidemia, who presents to the Emergency Room with the complaint of ongoing shortness of breath, which started about 4 to 5 days ago. The patient states that she was in her usual state of health until about 5 days ago, when she started having increasing shortness of breath, for which she came to the Emergency Room on Monday, December 7, and was evaluated by the ED physician. She was diagnosed to have COPD exacerbation; hence, she was given vigorous albuterol nebulizers in the Emergency Room and sent home on p.o. prednisone and Levaquin. The patient initially felt a little bit better but continued to have ongoing shortness of breath with intermittent wheezing and intermittent pedal edema, hence, again came back to the Emergency Room last night for further evaluation.   In the Emergency Room, the patient was evaluated by the ED physician and was found to have acute-on-chronic systolic congestive heart failure. She was given IV furosemide, following which she diuresed and started feeling better. She was also given oxygen supplementation for mild  hypoxia on admission and was given IV Solu-Medrol and albuterol nebulizers in the Emergency Room. The patient denies any history of any fever. She does have some cough but not able to bring out the phlegm. Denies any chest pain. No dizziness. No loss of consciousness. No  palpitations. No nausea. No vomiting. No diarrhea. No abdominal pain. No dysuria, frequency, or urgency. She does have a history of intermittent swelling of the bilateral legs, for which she takes p.r.n. furosemide infrequently. She is an active smoker and smokes about a half pack per day. She was diagnosed to have COPD, and she was supposed to take Spiriva and albuterol inhalers at home, which she admits she is not taking them on a regular basis. The patient at present is comfortably lying in the bed and states her shortness of breath is improving and denies any other complaints.   PAST MEDICAL HISTORY:  1.  Congestive heart failure with ejection fraction of 30 to 35% per echocardiogram done in August 2013.  2.  COPD.  3.  Chronic iron deficiency anemia status post PRBC transfusions in the past.  4.  History of hiatus hernia.  5.  Chronic kidney disease.  6.  Right hand neuropathy.  7.  Hyperlipidemia.   PAST SURGICAL HISTORY:  1.  Status post hysterectomy.  2.  Status post tonsillectomy.  3.  Status post right wrist surgery for a fracture.    ALLERGIES:  ZANTAC, WHICH CAUSES HER ITCHING AND A RASH.   HOME MEDICATIONS PER PATIENT: 1.  Carvedilol 12.5 mg p.o. b.i.d.  2.  Aspirin 81 mg p.o. daily. 3.  Simvastatin 20 mg p.o. daily. 4.  Gabapentin 300 mg at night.  5.  Lasix 20 mg as needed for pedal edema.  6.  Spiriva and albuterol inhalers, which she is not taking on a regular basis.   FAMILY  HISTORY:  Father with throat cancer. Grandfather with diabetes mellitus.   SOCIAL HISTORY:  She is divorced and lives alone at home. She is a retired Engineer, manufacturing systems. She is still smoking about 1/2 pack per day. Denies any alcohol or substance abuse.   REVIEW OF SYSTEMS: CONSTITUTIONAL:  Negative for fever, fatigue, or generalized weakness. No excessive weight gain or weight loss.  EYES:  Negative for blurred vision or double vision. No pain. No redness. No inflammation.  EARS, NOSE, AND THROAT:   Negative for tinnitus, ear pain, hearing loss, epistaxis, nasal discharge, or difficulty swallowing.  RESPIRATORY:  Ongoing shortness of breath for the past 4 to 5 days present and does have some cough but not able to bring out any phlegm. No hemoptysis. No painful respirations. History of COPD, and she is not taking Spiriva and albuterol on a daily basis.   CARDIOVASCULAR:  Negative for chest pain. She does have some intermittent pedal edema, for which she takes p.r.n. Lasix. Negative for palpitations. No dizziness. No loss of consciousness.  GASTROINTESTINAL:  Negative for nausea, vomiting, diarrhea, abdominal pain, hematemesis, melena, GERD symptoms, or rectal bleeding.  GENITOURINARY:  Negative for dysuria, hematuria, frequency, or urgency.  ENDOCRINE:  Negative for polyuria or nocturia. No heat or cold intolerance. HEMATOLOGIC:  History of chronic iron deficiency anemia, for which she takes iron pills. No easy bruising. No bleeding. No swollen glands.  INTEGUMENTARY:  Negative for acne, skin rash, or lesions.  MUSCULOSKELETAL:  Negative for any arthritis or swelling of the joints. No gout.  NEUROLOGICAL:  Negative for focal weakness or numbness. No history of CVA, TIA, or seizure disorder.  PSYCHIATRIC:  Negative for anxiety, insomnia, or depression.   PHYSICAL EXAMINATION: VITAL SIGNS:  On arrival to the Emergency Room, temperature 96.5 degrees Fahrenheit, pulse rate 86 per minute, respirations 20 per minute, blood pressure 129/78, and oxygen saturation 92% on room air. Current vital signs:  Pulse rate 77 per minute, respirations 20 per minute, blood pressure 147/74, oxygen saturation 97% on oxygen supplementation.  GENERAL:  Well-developed, well-nourished, elderly lady, pleasant and cooperative, alert and oriented, not in any acute distress at this time.  HEAD:  Atraumatic, normocephalic.  EYES:  Pupils are equal and reactive to light and accommodation. No conjunctival pallor. No scleral  icterus. Extraocular motions intact.  NOSE:  No nasal lesions. No drainage.  EARS:  No drainage. No external lesions.  ORAL CAVITY:  No mucosal lesions. No exudates. No masses.  NECK:  Supple. No JVD. No thyromegaly. No carotid bruit. Full range of motion of the neck.  RESPIRATORY:  Good respiratory effort. Not using accessory muscles of respiration at this time. Bilateral diffuse rhonchi present. Bilateral rales present.  CARDIOVASCULAR:  S1 and S2, regular. No murmurs appreciated. Peripheral pulses are equal at carotid, femoral, and pedal pulses. No peripheral edema.  GASTROINTESTINAL:  Abdomen is soft and nontender. No hepatosplenomegaly. Bowel sounds are equal and present in all 4 quadrants. No guarding. No rigidity. No tenderness.  GENITOURINARY:  Deferred.  MUSCULOSKELETAL:  Gait not tested. Range of motion in all areas is normal. Strength and tone are equal bilaterally.  SKIN:  Inspection within normal limits.  LYMPHATIC:  No cervical lymphadenopathy.  VASCULAR:  Good dorsalis pedis and posterior tibial pulses.  NEUROLOGICAL:  Alert, awake, and oriented x 3. Cranial nerves II through XII are grossly intact. DTRs are 2+ bilaterally and symmetrical. Motor strength is 5/5 in both upper and lower extremities.  PSYCHIATRIC:  Judgment and insight are  adequate. Alert and oriented x 3. Memory and mood are within normal limits.   LABORATORY DATA:  Serum glucose is 122, BUN 30, creatinine 1.46, sodium 139, potassium 4.5, chloride 106, bicarbonate 26, and total calcium is 8.8. Troponin is less than 0.02. WBC is 10.3, hemoglobin 15.2, hematocrit 46.5, and platelet count 289,000.   IMAGING STUDIES:  Chest x-ray impression:  Cardiac enlargement with pulmonary vascular congestion and interstitial changes. No consolidation.   EKG:  Normal sinus rhythm with ventricular rate of 92 beats per minute. Left axis deviation present. Nonspecific ST-T changes in the lateral leads.   ASSESSMENT AND PLAN:  A  69 year old Caucasian female with a past medical history of systolic congestive heart failure with ejection fraction of 30 to 35%, history of chronic obstructive pulmonary disease, active smoker, history of chronic iron deficiency anemia, hyperlipidemia, chronic kidney disease, and right hand neuropathy, who presents to the Emergency Room with the complaints of ongoing shortness of breath for the past 4 to 5 days associated with some cough.   1.  Acute-on-chronic systolic congestive heart failure secondary due to likely medication and dietary noncompliance. Ejection fraction per echocardiogram done in 2013 was 30 to 35%. Rule out acute coronary syndrome. Plan:  Admit to telemetry. IV Lasix. Cycle cardiac enzymes. We will continue carvedilol. The patient is not on ACE inhibitors because of history of  hyperkalemia in the past. We will order echocardiogram to know the current ejection fraction.  2.  Chronic obstructive pulmonary disease exacerbation secondary due to noncompliance to medical management and continued tobacco usage. Possible acute bronchitis cannot be ruled out. Plan:  Oxygen supplementation, IV Solu-Medrol, vigorous DuoNebs, Spiriva and Levaquin.   3.  Active smoker. Plan:  Counseled to quit and offered nicotine replacement therapy. The patient will decide at a later time.  4.  Chronic kidney disease. Creatinine is mildly elevated, mildly high, but stable. We will monitor BMP. Avoid nephrotoxic agents.  5.  Hyperlipidemia on simvastatin. Continue same.  6.  History of right hand neuropathy on gabapentin. Continue same.  7.  History of hiatus hernia. The patient is on Protonix. Continue same.  8.  History of chronic iron deficiency anemia. The patient is on iron pills. The patient is stable clinically. Hemoglobin and hematocrit are within normal limits. Continue iron pills.  9.  Deep vein thrombosis prophylaxis with subcutaneous Lovenox.  10.  Gastrointestinal prophylaxis with Protonix.    CODE STATUS:  Full code.  TIME SPENT:  50 minutes.     ____________________________ Juluis Mire, MD enr:nb D: 05/17/2014 03:17:09 ET T: 05/17/2014 03:54:12 ET JOB#: [id number]  cc: Juluis Mire, MD, <Dictator> Einar Pheasant, MD  Juluis Mire MD ELECTRONICALLY SIGNED 05/17/2014 20:36

## 2014-09-29 NOTE — Discharge Summary (Signed)
PATIENT NAME:  Sandra Ellison, Sandra Ellison MR#:  [id number] DATE OF BIRTH:  1945/06/20  DATE OF ADMISSION:  05/17/2014 DATE OF DISCHARGE:  05/17/2014  ADMISSION DIAGNOSIS: Acute on chronic systolic heart failure.   DISCHARGE DIAGNOSES:  1.  Acute on chronic systolic heart failure.  2.  Mild chronic obstructive pulmonary disease exacerbation.  3.  Tobacco dependence.  4.  Chronic kidney disease.  5.  History of hyperlipidemia.   CONSULTATIONS: None.   LABORATORIES: Troponins were negative.   PHYSICAL EXAMINATION AT DISCHARGE:  VITAL SIGNS: Temperature 97.3, pulse is 76, respirations 18, blood pressure 144/83, 94% on room air.  GENERAL: The patient was alert, oriented, not in acute distress.  CARDIOVASCULAR: Regular rate and rhythm. She has 2/6 systolic ejection murmur heard best at the right sternal border. PMI is laterally displaced.  LUNGS: Clear to auscultation without crackles, rales, rhonchi, or wheezing. Normal to percussion. ABDOMEN: Bowel sounds are positive. Nontender, nondistended. No hepatosplenomegaly.  EXTREMITIES: No clubbing, cyanosis, or edema.   HOSPITAL COURSE: This is a very pleasant 69 year old female who presented with shortness of breath. Was found to have acute on chronic systolic heart failure. For further details, please refer to the H and P.   1.  Acute on chronic systolic heart failure: Secondary likely to medication and dietary noncompliance. Her last ejection fraction was 30% -35%. She was admitted to telemetry. She actually improved with a couple hours of her admission. Her lungs were clear to auscultation. She did quite well with the Lasix that was given Emergency Room as well as the steroids. The patient will need to follow up with her cardiologist as an outpatient.  2.  COPD exacerbation, very mild: The patient is ongoing smoker. I did counsel patient against smoking. She was on IV steroids changed to p.o. steroids. Her inhalers will be labeled for outpatient use.  3.   Tobacco dependence: The patient was counseled for 3 minutes and offered nicotine replacement therapy while in the hospital. The patient did not want this during this time.  4.  Chronic kidney disease, stage III: The patient's creatinine stayed stable.  5.  Hyperlipidemia: On simvastatin.   DISCHARGE MEDICATIONS:  1.  Simvastatin 20 mg daily.  2.  Aspirin 81 mg daily. 3.  Pantoprazole 40 mg 1-2 tablets a day.  4.  Gabapentin 300 mg daily.  5.  Ferrous sulfate 325 mg b.i.d.  6.  Prednisone taper starting at 50 mg, taper x 10 mg every 2 days.  7.  Lasix 20 mg daily.  8.  Levaquin 250 mg q.24 hours.  9.  Coreg 3.125 b.i.d. 10.  Lisinopril 5 mg daily.   DISCHARGE DIET: Low sodium diet.   DISCHARGE ACTIVITY: As tolerated.   DISCHARGE FOLLOWUP: The patient will need a followup with Einar Pheasant in 1 week.   TIME SPENT: Approximately 45 minutes. The patient is stable for discharge.    ____________________________ Amara Justen P. Benjie Karvonen, MD spm:bm D: 05/17/2014 13:29:53 ET T: 05/18/2014 00:23:52 ET JOB#: [id number]  cc: Lionell Matuszak P. Benjie Karvonen, MD, <Dictator> Einar Pheasant, MD Donell Beers Shantai Tiedeman MD ELECTRONICALLY SIGNED 05/18/2014 13:51

## 2014-10-08 ENCOUNTER — Other Ambulatory Visit: Payer: Self-pay | Admitting: Internal Medicine

## 2014-10-10 ENCOUNTER — Other Ambulatory Visit: Payer: Self-pay | Admitting: Internal Medicine

## 2014-10-30 ENCOUNTER — Other Ambulatory Visit: Payer: Self-pay | Admitting: Internal Medicine

## 2014-11-19 ENCOUNTER — Other Ambulatory Visit: Payer: Self-pay | Admitting: Internal Medicine

## 2014-11-19 ENCOUNTER — Ambulatory Visit: Payer: Commercial Managed Care - HMO | Admitting: Cardiovascular Disease

## 2014-11-26 ENCOUNTER — Other Ambulatory Visit: Payer: Self-pay | Admitting: Internal Medicine

## 2014-11-28 ENCOUNTER — Ambulatory Visit: Payer: Commercial Managed Care - HMO | Admitting: Cardiovascular Disease

## 2014-12-02 ENCOUNTER — Other Ambulatory Visit: Payer: Self-pay | Admitting: Internal Medicine

## 2014-12-03 ENCOUNTER — Other Ambulatory Visit: Payer: Self-pay

## 2014-12-03 MED ORDER — PANTOPRAZOLE SODIUM 40 MG PO TBEC: 40.0000 mg | DELAYED_RELEASE_TABLET | Freq: Every day | ORAL | Status: DC

## 2014-12-03 NOTE — Telephone Encounter (Signed)
Refill sent for pantoprazole 40 mg  

## 2014-12-09 ENCOUNTER — Other Ambulatory Visit: Payer: Self-pay | Admitting: Internal Medicine

## 2014-12-12 ENCOUNTER — Ambulatory Visit: Payer: Commercial Managed Care - HMO | Admitting: Internal Medicine

## 2014-12-13 ENCOUNTER — Other Ambulatory Visit: Payer: Self-pay

## 2014-12-13 DIAGNOSIS — R920 Mammographic microcalcification found on diagnostic imaging of breast: Secondary | ICD-10-CM

## 2014-12-14 ENCOUNTER — Other Ambulatory Visit: Payer: Self-pay | Admitting: Internal Medicine

## 2014-12-15 ENCOUNTER — Other Ambulatory Visit: Payer: Self-pay | Admitting: Internal Medicine

## 2015-01-10 ENCOUNTER — Ambulatory Visit (INDEPENDENT_AMBULATORY_CARE_PROVIDER_SITE_OTHER): Payer: Commercial Managed Care - HMO | Admitting: Cardiovascular Disease

## 2015-01-10 ENCOUNTER — Encounter: Payer: Self-pay | Admitting: Cardiovascular Disease

## 2015-01-10 VITALS — BP 142/82 | HR 81 | Ht 65.0 in | Wt 162.8 lb

## 2015-01-10 DIAGNOSIS — Z720 Tobacco use: Secondary | ICD-10-CM

## 2015-01-10 DIAGNOSIS — I2510 Atherosclerotic heart disease of native coronary artery without angina pectoris: Secondary | ICD-10-CM

## 2015-01-10 DIAGNOSIS — I5022 Chronic systolic (congestive) heart failure: Secondary | ICD-10-CM

## 2015-01-10 DIAGNOSIS — I429 Cardiomyopathy, unspecified: Secondary | ICD-10-CM | POA: Diagnosis not present

## 2015-01-10 DIAGNOSIS — F17200 Nicotine dependence, unspecified, uncomplicated: Secondary | ICD-10-CM

## 2015-01-10 DIAGNOSIS — Z8679 Personal history of other diseases of the circulatory system: Secondary | ICD-10-CM

## 2015-01-10 DIAGNOSIS — I428 Other cardiomyopathies: Secondary | ICD-10-CM

## 2015-01-10 DIAGNOSIS — J449 Chronic obstructive pulmonary disease, unspecified: Secondary | ICD-10-CM | POA: Diagnosis not present

## 2015-01-10 MED ORDER — SACUBITRIL-VALSARTAN 49-51 MG PO TABS: 1.0000 | ORAL_TABLET | Freq: Two times a day (BID) | ORAL | Status: DC

## 2015-01-10 MED ORDER — VARENICLINE TARTRATE 1 MG PO TABS: 1.0000 mg | ORAL_TABLET | Freq: Two times a day (BID) | ORAL | Status: DC

## 2015-01-10 NOTE — Assessment & Plan Note (Signed)
Prescription provided for Chantix We have encouraged her to continue to work on weaning her cigarettes and smoking cessation.

## 2015-01-10 NOTE — Progress Notes (Signed)
Patient ID: Sandra Ellison, female    DOB: October 04, 1945, 69 y.o.   MRN: [id number]  HPI Comments: 69 year old woman with a history of peptic ulcer disease, nonischemic cardiomyopathy with history of systolic and diastolic CHF, COPD with long smoking history who continues to smoke, pulmonary hypertension seen on echocardiogram in March 2012 who presented to [facility] with increasing shortness of breath on February 11, 2011, chronic renal insufficiency, stage II, severe iron deficiency anemia,She is still smoking several cigarettes per day She presents for routine followup of her systolic heart failure  In follow-up today, she reports having dramatic weight gain after taking several rounds of prednisone for COPD exacerbation Breathing is better but weight is up significantly She denies any significant lower extremity edema. She is taking Lasix when necessary for shortness of breath Denies having any symptoms concerning for angina  EKG on today's visit normal sinus rhythm with rate 80 bpm, with T wave abnormality anterolateral leads   other past medical history Echo done in the hospital 05/17/2014 was reviewed with her showing ejection fraction 20-25%, mildly elevated right trigger systolic pressures  Previous hospital admission in 2012 she was found to have CHF, moderate bilateral pleural effusions with BNP of 26,000. Underlying anemia was a significant component of her presentation. Previously received procrit every other week, bone marrow biopsy, received packed red blood cells x2, iron transfusion x2,  followed by Dr. Ma Hillock.  Echocardiogram in the hospital showed ejection fraction 30-35%, mild to moderate right ventricular systolic pressure estimated at 40-50 mmHg  Echocardiogram performed February 11, 2011 shows ejection fraction less than 25% with severe global hypokinesis, apical akinesis, normal right ventricular systolic function , mild to moderate mitral valve regurgitation, normal right  ventricular systolic pressures  Stress test done in the hospital on February 13 2011 shows no significant ischemia. She has a dilated cardiomyopathy with ejection fraction 27%, old scar in the anteroseptal wall, inferior and inferolateral wall. This was a pharmacologic study         Allergies  Allergen Reactions  . Ranitidine Hcl Anaphylaxis and Swelling    Outpatient Encounter Prescriptions as of 01/10/2015  Medication Sig  . aspirin 81 MG tablet Take 81 mg by mouth daily.    . carvedilol (COREG) 3.125 MG tablet TAKE 1 TABLET (3.125 MG TOTAL) BY MOUTH 2 (TWO) TIMES DAILY WITH A MEAL.  . ferrous fumarate (HEMOCYTE - 106 MG FE) 325 (106 FE) MG TABS tablet Take 2 tablets by mouth daily.   . furosemide (LASIX) 20 MG tablet Take 1 tablet (20 mg total) by mouth daily as needed.  . gabapentin (NEURONTIN) 100 MG capsule Take 1 capsule (100 mg total) by mouth 3 (three) times daily as needed.  . gabapentin (NEURONTIN) 300 MG capsule TAKE ONE CAPSULE BY MOUTH 3 TIMES A DAY AS NEEDED  . pantoprazole (PROTONIX) 40 MG tablet Take 1 tablet (40 mg total) by mouth daily.  . simvastatin (ZOCOR) 10 MG tablet TAKE 1 TABLET BY MOUTH AT BEDTIME  . SPIRIVA HANDIHALER 18 MCG inhalation capsule INHALE CONTENTS OF 1 CAPSULE VIA HANDIHALER AS NEEDED  . [DISCONTINUED] losartan (COZAAR) 25 MG tablet Take 1 tablet (25 mg total) by mouth daily.  . cefdinir (OMNICEF) 300 MG capsule Take 1 capsule (300 mg total) by mouth 2 (two) times daily. (Patient not taking: Reported on 01/10/2015)  . predniSONE (DELTASONE) 10 MG tablet Take 6 tablets x 1 day and then decrease by 1/2 tablet per day until down to zero mg. (Patient not  taking: Reported on 01/10/2015)  . PROAIR HFA 108 (90 BASE) MCG/ACT inhaler INHALE 2 PUFFS INTO THE LUNGS EVERY 6 (SIX) HOURS AS NEEDED FOR WHEEZING. (Patient not taking: Reported on 08/24/2014)  . sacubitril-valsartan (ENTRESTO) 49-51 MG Take 1 tablet by mouth 2 (two) times daily.  . simvastatin (ZOCOR) 10  MG tablet TAKE 1 TABLET BY MOUTH AT BEDTIME (Patient not taking: Reported on 01/10/2015)  . varenicline (CHANTIX CONTINUING MONTH PAK) 1 MG tablet Take 1 tablet (1 mg total) by mouth 2 (two) times daily.   No facility-administered encounter medications on file as of 01/10/2015.    Past Medical History  Diagnosis Date  . CHF (congestive heart failure)   . GERD (gastroesophageal reflux disease)   . COPD (chronic obstructive pulmonary disease)   . Hx of colonic polyps   . Iron deficiency anemia   . Chronic kidney disease (CKD), stage II (mild)   . Pulmonary HTN   . Arthritis   . Allergy   . History of blood transfusion   . Diverticulosis     H/O  . History of abnormal Pap smear     s/p vaginal hysterectomy    Past Surgical History  Procedure Laterality Date  . Tonsillectomy  age 34  . Vaginal hysterectomy  1981    secondary to abnormal pap smear  . Orif distal radius fracture  2011    right  . Colonoscopy  10/2010    Social History  reports that she has been smoking Cigarettes.  She has a 10.5 pack-year smoking history. She has never used smokeless tobacco. She reports that she does not drink alcohol or use illicit drugs.  Family History family history includes Alcohol abuse in her father; Arthritis in her father and mother; Cancer in her father.   Review of Systems  Constitutional: Negative.   Eyes: Negative.   Respiratory: Positive for shortness of breath.   Cardiovascular: Negative.   Gastrointestinal: Negative.   Musculoskeletal: Positive for arthralgias.  Neurological: Negative.   Hematological: Negative.   Psychiatric/Behavioral: Positive for sleep disturbance.  All other systems reviewed and are negative.   BP 142/82 mmHg  Pulse 81  Ht _0  (1.651 m)  Wt 162 lb 12 oz (73.823 kg)  BMI 27.08 kg/m2  Physical Exam  Constitutional: She is oriented to person, place, and time. She appears well-developed and well-nourished.  HENT:  Head: Normocephalic.  Nose:  Nose normal.  Mouth/Throat: Oropharynx is clear and moist.  Eyes: Conjunctivae are normal. Pupils are equal, round, and reactive to light.  Neck: Normal range of motion. Neck supple. No JVD present.  Cardiovascular: Normal rate, regular rhythm, S1 normal, S2 normal, normal heart sounds and intact distal pulses.  Exam reveals no gallop and no friction rub.   No murmur heard. Pulmonary/Chest: Effort normal. No respiratory distress. She has decreased breath sounds. She exhibits no tenderness.  Abdominal: Soft. Bowel sounds are normal. She exhibits no distension. There is no tenderness.  Musculoskeletal: Normal range of motion. She exhibits no edema or tenderness.  Lymphadenopathy:    She has no cervical adenopathy.  Neurological: She is alert and oriented to person, place, and time. Coordination normal.  Skin: Skin is warm and dry. No rash noted. No erythema.  Psychiatric: She has a normal mood and affect. Her behavior is normal. Judgment and thought content normal.    Assessment and Plan  Nursing note and vitals reviewed.

## 2015-01-10 NOTE — Assessment & Plan Note (Signed)
We have recommended she hold the losartan We will start entresto 49/51 milligrams by mouth twice a day If tolerated, after one month will increase the dose with BMP at that time

## 2015-01-10 NOTE — Assessment & Plan Note (Signed)
Several courses of prednisone with weight gain, improve shortness of breath

## 2015-01-10 NOTE — Assessment & Plan Note (Signed)
Currently with no symptoms of angina. No further workup at this time. Continue current medication regimen. 

## 2015-01-10 NOTE — Patient Instructions (Addendum)
You are doing well.  Please start entresto one pill twice a day Hold the losartan  Lasix as needed for shortness of breath  Please start chantix  Week 1: 1/2 pill in am Week 2:  1/2 pill in am, 1/2 pill in pm Week 3: whole pill in am, 1/2 pill in pm Week 4: whole pill in am, whole pill in pm   Please call us if you have new issues that need to be addressed before your next appt.  Your physician wants you to follow-up in: 6 months.  You will receive a reminder letter in the mail two months in advance. If you don't receive a letter, please call our office to schedule the follow-up appointment.

## 2015-01-11 ENCOUNTER — Other Ambulatory Visit: Payer: Commercial Managed Care - HMO

## 2015-01-11 ENCOUNTER — Ambulatory Visit: Payer: Commercial Managed Care - HMO

## 2015-01-14 ENCOUNTER — Encounter: Payer: Self-pay | Admitting: *Deleted

## 2015-01-14 ENCOUNTER — Telehealth: Payer: Self-pay | Admitting: *Deleted

## 2015-01-14 NOTE — Telephone Encounter (Signed)
Pt is requiring PA for Entresto 24/26 mg; Forms have been filled out and faxed; Awaiting approval.

## 2015-01-16 ENCOUNTER — Ambulatory Visit
Admission: RE | Admit: 2015-01-16 | Discharge: 2015-01-16 | Disposition: A | Discharge status: Home / Self Care | Payer: Commercial Managed Care - HMO | Source: Ambulatory Visit | Attending: General Surgery | Admitting: General Surgery

## 2015-01-16 ENCOUNTER — Other Ambulatory Visit: Payer: Self-pay | Admitting: General Surgery

## 2015-01-16 ENCOUNTER — Ambulatory Visit: Payer: Commercial Managed Care - HMO

## 2015-01-16 DIAGNOSIS — R921 Mammographic calcification found on diagnostic imaging of breast: Secondary | ICD-10-CM | POA: Diagnosis not present

## 2015-01-16 DIAGNOSIS — R920 Mammographic microcalcification found on diagnostic imaging of breast: Secondary | ICD-10-CM

## 2015-01-21 ENCOUNTER — Other Ambulatory Visit: Payer: Self-pay | Admitting: *Deleted

## 2015-01-21 MED ORDER — SACUBITRIL-VALSARTAN 49-51 MG PO TABS: 1.0000 | ORAL_TABLET | Freq: Two times a day (BID) | ORAL | Status: DC

## 2015-01-21 NOTE — Telephone Encounter (Signed)
Patient called to say she received approval letter from [facility] in mail for entresto and wants to make sure we did as well and to make sure it was passed on to the pharmacy .  Please let patient know.

## 2015-01-21 NOTE — Telephone Encounter (Signed)
Pt has been approved for Entresto 24/26 mg from 01/14/2015-01/13/2017.

## 2015-01-22 ENCOUNTER — Ambulatory Visit: Payer: Commercial Managed Care - HMO | Admitting: General Surgery

## 2015-01-24 ENCOUNTER — Encounter: Payer: Self-pay | Admitting: General Surgery

## 2015-01-24 ENCOUNTER — Ambulatory Visit (INDEPENDENT_AMBULATORY_CARE_PROVIDER_SITE_OTHER): Payer: Commercial Managed Care - HMO | Admitting: General Surgery

## 2015-01-24 VITALS — BP 128/82 | HR 78 | Resp 14 | Ht 65.0 in | Wt 164.8 lb

## 2015-01-24 DIAGNOSIS — R920 Mammographic microcalcification found on diagnostic imaging of breast: Secondary | ICD-10-CM

## 2015-01-24 DIAGNOSIS — Z1211 Encounter for screening for malignant neoplasm of colon: Secondary | ICD-10-CM | POA: Diagnosis not present

## 2015-01-24 NOTE — Patient Instructions (Addendum)
Colonoscopy A colonoscopy is an exam to look at the entire large intestine (colon). This exam can help find problems such as tumors, polyps, inflammation, and areas of bleeding. The exam takes about 1 hour.  LET [facility] CARE PROVIDER KNOW ABOUT:   Any allergies you have.  All medicines you are taking, including vitamins, herbs, eye drops, creams, and over-the-counter medicines.  Previous problems you or members of your family have had with the use of anesthetics.  Any blood disorders you have.  Previous surgeries you have had.  Medical conditions you have. RISKS AND COMPLICATIONS  Generally, this is a safe procedure. However, as with any procedure, complications can occur. Possible complications include:  Bleeding.  Tearing or rupture of the colon wall.  Reaction to medicines given during the exam.  Infection (rare). BEFORE THE PROCEDURE   Ask your health care provider about changing or stopping your regular medicines.  You may be prescribed an oral bowel prep. This involves drinking a large amount of medicated liquid, starting the day before your procedure. The liquid will cause you to have multiple loose stools until your stool is almost clear or light green. This cleans out your colon in preparation for the procedure.  Do not eat or drink anything else once you have started the bowel prep, unless your health care provider tells you it is safe to do so.  Arrange for someone to drive you home after the procedure. PROCEDURE   You will be given medicine to help you relax (sedative).  You will lie on your side with your knees bent.  A long, flexible tube with a light and camera on the end (colonoscope) will be inserted through the rectum and into the colon. The camera sends video back to a computer screen as it moves through the colon. The colonoscope also releases carbon dioxide gas to inflate the colon. This helps your health care provider see the area better.  During  the exam, your health care provider may take a small tissue sample (biopsy) to be examined under a microscope if any abnormalities are found.  The exam is finished when the entire colon has been viewed. AFTER THE PROCEDURE   Do not drive for 24 hours after the exam.  You may have a small amount of blood in your stool.  You may pass moderate amounts of gas and have mild abdominal cramping or bloating. This is caused by the gas used to inflate your colon during the exam.  Ask when your test results will be ready and how you will get your results. Make sure you get your test results. Document Released: 05/22/2000 Document Revised: 03/15/2013 Document Reviewed: 01/30/2013 [facility] Patient Information 2015 [place], [place]. This information is not intended to replace advice given to you by your health care provider. Make sure you discuss any questions you have with your health care provider.  Follow up in one year with mammogram and office visit.   Patient will be contacted once November 2016 schedule is available to arrange a date for colonoscopy.

## 2015-01-24 NOTE — Progress Notes (Signed)
Patient ID: Sandra Ellison, female   DOB: April 24, 1946, 69 y.o.   MRN: [id number]  Chief Complaint  Patient presents with  . Follow-up    mammogram    HPI Sandra Ellison is a 69 y.o. female.  who presents for a breast evaluation. The most recent mammogram and left breast ultrasound was done on 01-16-15. Patient does perform regular self breast checks and gets regular mammograms done. She denies any new breast complaints.   HPI  Past Medical History  Diagnosis Date  . CHF (congestive heart failure)   . GERD (gastroesophageal reflux disease)   . COPD (chronic obstructive pulmonary disease)   . Hx of colonic polyps   . Iron deficiency anemia   . Chronic kidney disease (CKD), stage II (mild)   . Pulmonary HTN   . Arthritis   . Allergy   . History of blood transfusion   . Diverticulosis     H/O  . History of abnormal Pap smear     s/p vaginal hysterectomy    Past Surgical History  Procedure Laterality Date  . Tonsillectomy  age 36  . Vaginal hysterectomy  1981    secondary to abnormal pap smear  . Orif distal radius fracture  2011    right  . Colonoscopy  10/2010    Family History  Problem Relation Age of Onset  . Arthritis Mother   . Alcohol abuse Father   . Arthritis Father   . Cancer Father     throat/spine  . Breast cancer Neg Hx     Social History Social History  Substance Use Topics  . Smoking status: Current Every Day Smoker -- 0.25 packs/day for 42 years    Types: Cigarettes  . Smokeless tobacco: Never Used     Comment: Quit early December 2015  . Alcohol Use: No    Allergies  Allergen Reactions  . Ranitidine Hcl Anaphylaxis and Swelling    Current Outpatient Prescriptions  Medication Sig Dispense Refill  . aspirin 81 MG tablet Take 81 mg by mouth daily.      . carvedilol (COREG) 3.125 MG tablet TAKE 1 TABLET (3.125 MG TOTAL) BY MOUTH 2 (TWO) TIMES DAILY WITH A MEAL. 60 tablet 5  . ferrous fumarate (HEMOCYTE - 106 MG FE) 325 (106 FE) MG TABS  tablet Take 2 tablets by mouth daily.     . furosemide (LASIX) 20 MG tablet Take 1 tablet (20 mg total) by mouth daily as needed. 30 tablet 6  . gabapentin (NEURONTIN) 100 MG capsule Take 1 capsule (100 mg total) by mouth 3 (three) times daily as needed. 90 capsule 1  . gabapentin (NEURONTIN) 300 MG capsule TAKE ONE CAPSULE BY MOUTH 3 TIMES A DAY AS NEEDED 90 capsule 5  . losartan (COZAAR) 25 MG tablet TAKE 1 TABLET (25 MG TOTAL) BY MOUTH DAILY.  3  . pantoprazole (PROTONIX) 40 MG tablet Take 1 tablet (40 mg total) by mouth daily. 30 tablet 3  . PROAIR HFA 108 (90 BASE) MCG/ACT inhaler INHALE 2 PUFFS INTO THE LUNGS EVERY 6 (SIX) HOURS AS NEEDED FOR WHEEZING. 8.5 each 2  . sacubitril-valsartan (ENTRESTO) 49-51 MG Take 1 tablet by mouth 2 (two) times daily. 60 tablet 6  . simvastatin (ZOCOR) 10 MG tablet TAKE 1 TABLET BY MOUTH AT BEDTIME 90 tablet 1  . SPIRIVA HANDIHALER 18 MCG inhalation capsule INHALE CONTENTS OF 1 CAPSULE VIA HANDIHALER AS NEEDED 30 capsule 5  . varenicline (CHANTIX CONTINUING MONTH PAK) 1 MG  tablet Take 1 tablet (1 mg total) by mouth 2 (two) times daily. 56 tablet 6   No current facility-administered medications for this visit.    Review of Systems Review of Systems  Constitutional: Negative.   Respiratory: Negative.   Cardiovascular: Negative.   Gastrointestinal: Positive for constipation. Negative for nausea, vomiting, abdominal pain, diarrhea, blood in stool, abdominal distention, anal bleeding and rectal pain.    Blood pressure 128/82, pulse 78, resp. rate 14, height 5\' 5"  (1.651 m), weight 164 lb 12.8 oz (74.753 kg).  Physical Exam Physical Exam  Constitutional: She is oriented to person, place, and time. She appears well-developed and well-nourished.  Eyes: Conjunctivae are normal. No scleral icterus.  Neck: Neck supple.  Cardiovascular: Normal rate, regular rhythm and normal heart sounds.   Pulmonary/Chest: Effort normal and breath sounds normal. Right breast  exhibits no inverted nipple, no mass, no nipple discharge, no skin change and no tenderness. Left breast exhibits no inverted nipple, no mass, no nipple discharge, no skin change and no tenderness.  Lymphadenopathy:    She has no cervical adenopathy.    She has no axillary adenopathy.  Neurological: She is alert and oriented to person, place, and time.  Skin: Skin is warm and dry.  Psychiatric: She has a normal mood and affect.    Data Reviewed Bilateral mammograms and left breast ultrasound of 01/16/2015 reviewed. Right breast calcifications have been stable for the past year.  Questionable distortion the left breast prompting ultrasound with identification of a 5 mm complex cyst in the 2:00 position of the left breast 3 cm from the nipple. Bilateral diagnostic mammograms recommended in 6 months.  Assessment    Benign breast exam.  Stable microcalcifications.      Plan    The radiologist recommendations were reviewed. The microcalcifications are unchanged and a been low risk in my opinion from the get go. 6 or 12 month follow-up is reasonable. Patient is comfortable with the 12 month follow-up.  The new small nodular area in the left breast can be followed with a 6 month ultrasound. Unlikely benefit of additional mammographic imaging at that time.      Colonoscopy with possible biopsy/polypectomy prn: Information regarding the procedure, including its potential risks and complications (including but not limited to perforation of the bowel, which may require emergency surgery to repair, and bleeding) was verbally given to the patient. Educational information regarding lower instestinal endoscopy was given to the patient. Written instructions for how to complete the bowel prep using Miralax were provided. The importance of drinking ample fluids to avoid dehydration as a result of the prep emphasized.  Patient will be contacted once November 2016 schedule is available to arrange a date  for colonoscopy. Patient has been given paperwork and instructions were reviewed with the patient today. Miralax prescription will be sent to patient's pharmacy once date arranged. This patient has been asked to take Dulcolax 2 tablets two days prior and one tablet one day prior to colonoscopy in addition to Miralax prep.   PCP:  Nash Shearer 01/26/2015, 8:29 AM

## 2015-01-26 DIAGNOSIS — Z1211 Encounter for screening for malignant neoplasm of colon: Secondary | ICD-10-CM | POA: Insufficient documentation

## 2015-02-09 ENCOUNTER — Other Ambulatory Visit: Payer: Self-pay | Admitting: Internal Medicine

## 2015-02-14 ENCOUNTER — Telehealth: Payer: Self-pay | Admitting: *Deleted

## 2015-02-14 MED ORDER — POLYETHYLENE GLYCOL 3350 17 GM/SCOOP PO POWD: ORAL | Status: DC

## 2015-02-14 NOTE — Telephone Encounter (Signed)
Patient has been scheduled for a colonoscopy on 04-17-15.  Miralax prescription has been sent in to patient's pharmacy today.

## 2015-02-21 ENCOUNTER — Ambulatory Visit (INDEPENDENT_AMBULATORY_CARE_PROVIDER_SITE_OTHER): Payer: Commercial Managed Care - HMO | Admitting: Internal Medicine

## 2015-02-21 ENCOUNTER — Encounter: Payer: Self-pay | Admitting: Internal Medicine

## 2015-02-21 VITALS — BP 120/84 | HR 75 | Temp 98.1°F | Resp 18 | Ht 65.0 in | Wt 167.0 lb

## 2015-02-21 DIAGNOSIS — I2510 Atherosclerotic heart disease of native coronary artery without angina pectoris: Secondary | ICD-10-CM | POA: Diagnosis not present

## 2015-02-21 DIAGNOSIS — J449 Chronic obstructive pulmonary disease, unspecified: Secondary | ICD-10-CM

## 2015-02-21 DIAGNOSIS — R911 Solitary pulmonary nodule: Secondary | ICD-10-CM

## 2015-02-21 DIAGNOSIS — R928 Other abnormal and inconclusive findings on diagnostic imaging of breast: Secondary | ICD-10-CM

## 2015-02-21 DIAGNOSIS — K219 Gastro-esophageal reflux disease without esophagitis: Secondary | ICD-10-CM

## 2015-02-21 DIAGNOSIS — F17200 Nicotine dependence, unspecified, uncomplicated: Secondary | ICD-10-CM

## 2015-02-21 DIAGNOSIS — Z23 Encounter for immunization: Secondary | ICD-10-CM | POA: Diagnosis not present

## 2015-02-21 DIAGNOSIS — D649 Anemia, unspecified: Secondary | ICD-10-CM

## 2015-02-21 DIAGNOSIS — Z720 Tobacco use: Secondary | ICD-10-CM

## 2015-02-21 DIAGNOSIS — E785 Hyperlipidemia, unspecified: Secondary | ICD-10-CM

## 2015-02-21 DIAGNOSIS — R0989 Other specified symptoms and signs involving the circulatory and respiratory systems: Secondary | ICD-10-CM

## 2015-02-21 MED ORDER — SIMVASTATIN 10 MG PO TABS: 10.0000 mg | ORAL_TABLET | Freq: Every day | ORAL | Status: DC

## 2015-02-21 MED ORDER — PANTOPRAZOLE SODIUM 40 MG PO TBEC: 40.0000 mg | DELAYED_RELEASE_TABLET | Freq: Every day | ORAL | Status: DC

## 2015-02-21 NOTE — Progress Notes (Signed)
Pre-visit discussion using our clinic review tool. No additional management support is needed unless otherwise documented below in the visit note.  

## 2015-02-21 NOTE — Progress Notes (Signed)
Patient ID: Sandra Ellison, female   DOB: [birth year], 69 y.o.   MRN: [id number]   Subjective:    Patient ID: Sandra Ellison, female    DOB: August 22, 1945, 69 y.o.   MRN: [id number]  HPI  Patient here for a scheduled follow up.  She is still smoking.  We discussed the need to quit smoking.  Her breathing is stable.  Overall doing better.  She has gained some weight.  We discussed diet and exercise.  She is followed by cardiology.  Now on entrestro.  Tolerating.  Some minimal left ankle swelling, but otherwise no significant swelling.  She hurt her ankle years ago.  No acid reflux reported.  No abdominal pain or cramping.  Bowels stable.     Past Medical History  Diagnosis Date  . CHF (congestive heart failure)   . GERD (gastroesophageal reflux disease)   . COPD (chronic obstructive pulmonary disease)   . Hx of colonic polyps   . Iron deficiency anemia   . Chronic kidney disease (CKD), stage II (mild)   . Pulmonary HTN   . Arthritis   . Allergy   . History of blood transfusion   . Diverticulosis     H/O  . History of abnormal Pap smear     s/p vaginal hysterectomy   Past Surgical History  Procedure Laterality Date  . Tonsillectomy  age 54  . Vaginal hysterectomy  1981    secondary to abnormal pap smear  . Orif distal radius fracture  2011    right  . Colonoscopy  10/2010   Family History  Problem Relation Age of Onset  . Arthritis Mother   . Alcohol abuse Father   . Arthritis Father   . Cancer Father     throat/spine  . Breast cancer Neg Hx    Social History   Social History  . Marital Status: Single    Spouse Name: N/A  . Number of Children: 1  . Years of Education: N/A   Social History Main Topics  . Smoking status: Current Every Day Smoker -- 0.25 packs/day for 42 years    Types: Cigarettes  . Smokeless tobacco: Never Used     Comment: Quit early December 2015  . Alcohol Use: No  . Drug Use: No  . Sexual Activity: Not Asked   Other Topics Concern  . None     Social History Narrative    Outpatient Encounter Prescriptions as of 02/21/2015  Medication Sig  . aspirin 81 MG tablet Take 81 mg by mouth daily.    . carvedilol (COREG) 3.125 MG tablet TAKE 1 TABLET (3.125 MG TOTAL) BY MOUTH 2 (TWO) TIMES DAILY WITH A MEAL.  . ferrous fumarate (HEMOCYTE - 106 MG FE) 325 (106 FE) MG TABS tablet Take 2 tablets by mouth daily.   . furosemide (LASIX) 20 MG tablet Take 1 tablet (20 mg total) by mouth daily as needed.  . gabapentin (NEURONTIN) 100 MG capsule TAKE 1 CAPSULE (100 MG TOTAL) BY MOUTH 3 (THREE) TIMES DAILY AS NEEDED.  Marland Kitchen gabapentin (NEURONTIN) 300 MG capsule TAKE ONE CAPSULE BY MOUTH 3 TIMES A DAY AS NEEDED  . pantoprazole (PROTONIX) 40 MG tablet Take 1 tablet (40 mg total) by mouth daily.  . polyethylene glycol powder (GLYCOLAX/MIRALAX) powder 255 grams one bottle for colonoscopy prep  . PROAIR HFA 108 (90 BASE) MCG/ACT inhaler INHALE 2 PUFFS INTO THE LUNGS EVERY 6 (SIX) HOURS AS NEEDED FOR WHEEZING. (Patient not taking: Reported on 02/21/2015)  .  sacubitril-valsartan (ENTRESTO) 49-51 MG Take 1 tablet by mouth 2 (two) times daily.  . simvastatin (ZOCOR) 10 MG tablet Take 1 tablet (10 mg total) by mouth at bedtime.  Marland Kitchen SPIRIVA HANDIHALER 18 MCG inhalation capsule INHALE CONTENTS OF 1 CAPSULE VIA HANDIHALER AS NEEDED  . varenicline (CHANTIX CONTINUING MONTH PAK) 1 MG tablet Take 1 tablet (1 mg total) by mouth 2 (two) times daily. (Patient not taking: Reported on 02/21/2015)  . [DISCONTINUED] losartan (COZAAR) 25 MG tablet TAKE 1 TABLET (25 MG TOTAL) BY MOUTH DAILY.  . [DISCONTINUED] pantoprazole (PROTONIX) 40 MG tablet Take 1 tablet (40 mg total) by mouth daily.  . [DISCONTINUED] simvastatin (ZOCOR) 10 MG tablet TAKE 1 TABLET BY MOUTH AT BEDTIME   No facility-administered encounter medications on file as of 02/21/2015.    Review of Systems  Constitutional: Negative for fever and chills.  HENT: Negative for congestion and sinus pressure.    Respiratory: Negative for cough, chest tightness and shortness of breath.   Cardiovascular: Negative for chest pain, palpitations and leg swelling (no significant leg swelling.).  Gastrointestinal: Negative for nausea, vomiting, abdominal pain and diarrhea.  Genitourinary: Negative for dysuria and difficulty urinating.  Musculoskeletal: Negative for back pain and joint swelling.  Skin: Negative for color change and rash.  Neurological: Negative for dizziness, light-headedness and headaches.  Psychiatric/Behavioral: Negative for dysphoric mood and agitation.       Objective:    Physical Exam  Constitutional: She appears well-developed and well-nourished. No distress.  HENT:  Nose: Nose normal.  Mouth/Throat: Oropharynx is clear and moist.  Eyes: Conjunctivae are normal. Right eye exhibits no discharge. Left eye exhibits no discharge.  Neck: Neck supple. No thyromegaly present.  Cardiovascular: Normal rate and regular rhythm.   Right carotid bruit.    Pulmonary/Chest: Breath sounds normal. No respiratory distress. She has no wheezes.  Abdominal: Soft. Bowel sounds are normal. There is no tenderness.  Musculoskeletal: She exhibits no edema or tenderness.  Lymphadenopathy:    She has no cervical adenopathy.  Skin: Skin is warm. No rash noted. No erythema.  Psychiatric: She has a normal mood and affect. Her behavior is normal.    BP 120/84 mmHg  Pulse 75  Temp(Src) 98.1 F (36.7 C) (Oral)  Resp 18  Ht 5\' 5"  (1.651 m)  Wt 167 lb (75.751 kg)  BMI 27.79 kg/m2  SpO2 96% Wt Readings from Last 3 Encounters:  02/21/15 167 lb (75.751 kg)  01/24/15 164 lb 12.8 oz (74.753 kg)  01/10/15 162 lb 12 oz (73.823 kg)     Lab Results  Component Value Date   WBC 8.0 09/27/2014   HGB 12.7 09/27/2014   HCT 37.6 09/27/2014   PLT 362.0 09/27/2014   GLUCOSE 130* 08/30/2014   CHOL 179 08/30/2014   TRIG 98.0 08/30/2014   HDL 49.60 08/30/2014   LDLCALC 110* 08/30/2014   ALT 20 08/30/2014    AST 18 08/30/2014   NA 137 08/30/2014   K 4.7 08/30/2014   CL 105 08/30/2014   CREATININE 1.20 08/30/2014   BUN 19 08/30/2014   CO2 27 08/30/2014   TSH 3.11 08/30/2014   INR 1.0 02/05/2012    US Breast Vinings  01/16/2015   CLINICAL DATA:  Six month re-evaluation of probably benign right breast calcifications and annual re-evaluation of the left breast.  EXAM: DIGITAL DIAGNOSTIC BILATERAL MAMMOGRAM WITH 3D TOMOSYNTHESIS WITH CAD  ULTRASOUND LEFT BREAST  COMPARISON:  07/09/2014, 12/25/2013, 12/19/2013.  ACR Breast Density Category  c: The breast tissue is heterogeneously dense, which may obscure small masses.  FINDINGS: There is a small group of relatively coarse stable likely dystrophic calcifications located within the lower inner quadrant of the right breast. There is an oval, circumscribed area of nodularity located within the left breast at approximately the 2 to 3 o'clock position seen on the 3D imaging. There is no worrisome mass, distortion, or worrisome calcification within either breast.  Mammographic images were processed with CAD.  On physical exam, there is no discrete palpable abnormality within the lateral left breast.  Targeted ultrasound is performed, showing an oval, circumscribed, parallel hypoechoic lesion with low-level internal echoes located within the left breast at 2 o'clock position 3 cm from nipple most likely representing a minimally complicated cyst. This measures 5 x 5 x 3 mm in size. There is no mass, distortion, or worrisome shadowing.  IMPRESSION: Stable probably benign right breast calcifications. Small area of probably benign nodularity located within the left breast at the 2 to 3 o'clock position likely now visualized due to the 3D imaging. I recommend followup bilateral diagnostic mammography in 6 months.  RECOMMENDATION: Bilateral diagnostic mammography with tomosynthesis in 6 months.  I have discussed the findings and recommendations with the  patient. Results were also provided in writing at the conclusion of the visit. If applicable, a reminder letter will be sent to the patient regarding the next appointment.  BI-RADS CATEGORY  3: Probably benign.   Electronically Signed   By: Altamese Cabal M.D.   On: 01/16/2015 12:49   Mm Diag Breast Tomo Bilateral  01/16/2015   CLINICAL DATA:  Six month re-evaluation of probably benign right breast calcifications and annual re-evaluation of the left breast.  EXAM: DIGITAL DIAGNOSTIC BILATERAL MAMMOGRAM WITH 3D TOMOSYNTHESIS WITH CAD  ULTRASOUND LEFT BREAST  COMPARISON:  07/09/2014, 12/25/2013, 12/19/2013.  ACR Breast Density Category c: The breast tissue is heterogeneously dense, which may obscure small masses.  FINDINGS: There is a small group of relatively coarse stable likely dystrophic calcifications located within the lower inner quadrant of the right breast. There is an oval, circumscribed area of nodularity located within the left breast at approximately the 2 to 3 o'clock position seen on the 3D imaging. There is no worrisome mass, distortion, or worrisome calcification within either breast.  Mammographic images were processed with CAD.  On physical exam, there is no discrete palpable abnormality within the lateral left breast.  Targeted ultrasound is performed, showing an oval, circumscribed, parallel hypoechoic lesion with low-level internal echoes located within the left breast at 2 o'clock position 3 cm from nipple most likely representing a minimally complicated cyst. This measures 5 x 5 x 3 mm in size. There is no mass, distortion, or worrisome shadowing.  IMPRESSION: Stable probably benign right breast calcifications. Small area of probably benign nodularity located within the left breast at the 2 to 3 o'clock position likely now visualized due to the 3D imaging. I recommend followup bilateral diagnostic mammography in 6 months.  RECOMMENDATION: Bilateral diagnostic mammography with  tomosynthesis in 6 months.  I have discussed the findings and recommendations with the patient. Results were also provided in writing at the conclusion of the visit. If applicable, a reminder letter will be sent to the patient regarding the next appointment.  BI-RADS CATEGORY  3: Probably benign.   Electronically Signed   By: Altamese Cabal M.D.   On: 01/16/2015 12:49       Assessment & Plan:   Problem List Items  Addressed This Visit    Abnormal mammogram    Saw Dr Bary Castilla.  Mammogram as outlined.  Recommended f/u in 6 months.  Last mammogram 01/2015.        Anemia    Her anemia has been felt to be related to iron deficiency and chronic renal disease.  Follow metabolic panel.       CAD (coronary artery disease)    Continue current medication regimen.  Followed by Dr Rockey Situ.        Relevant Medications   simvastatin (ZOCOR) 10 MG tablet   Other Relevant Orders   Basic metabolic panel   Carotid bruit    Right carotid bruit.  She reports cardiology has followed.  Will f/u with Dr Rockey Situ and see if further w/up warranted.        COPD (chronic obstructive pulmonary disease)    Breathing stable now.  Follow.        GERD (gastroesophageal reflux disease)    EGD as outlined in overview.  Currently without symptoms.  Follow.  Question if the f/u EGD was performed in 2014.  Obtain records.       Relevant Medications   pantoprazole (PROTONIX) 40 MG tablet   Hyperlipidemia    Low cholesterol diet and exercise.  Follow lipid panel.        Relevant Medications   simvastatin (ZOCOR) 10 MG tablet   Other Relevant Orders   Hepatic function panel   Lipid panel   Pulmonary nodule    Reviewed CT recommendation.  Will d/w her regarding f/u CT chest.        Smoking    Discussed the need for smoking cessation.  She desires not to quit at this time.  Follow.        Other Visit Diagnoses    Encounter for immunization    -  Primary        Einar Pheasant, MD

## 2015-02-23 ENCOUNTER — Encounter: Payer: Self-pay | Admitting: Internal Medicine

## 2015-02-23 ENCOUNTER — Other Ambulatory Visit: Payer: Self-pay | Admitting: Internal Medicine

## 2015-02-23 DIAGNOSIS — R319 Hematuria, unspecified: Secondary | ICD-10-CM

## 2015-02-23 DIAGNOSIS — R928 Other abnormal and inconclusive findings on diagnostic imaging of breast: Secondary | ICD-10-CM | POA: Insufficient documentation

## 2015-02-23 NOTE — Assessment & Plan Note (Signed)
Low cholesterol diet and exercise.  Follow lipid panel.   

## 2015-02-23 NOTE — Progress Notes (Signed)
Order placed for f/u urinalysis 

## 2015-02-23 NOTE — Assessment & Plan Note (Signed)
Continue current medication regimen.  Followed by Dr Rockey Situ.

## 2015-02-23 NOTE — Assessment & Plan Note (Signed)
Right carotid bruit.  She reports cardiology has followed.  Will f/u with Dr Rockey Situ and see if further w/up warranted.

## 2015-02-23 NOTE — Assessment & Plan Note (Signed)
Saw Dr Bary Castilla.  Mammogram as outlined.  Recommended f/u in 6 months.  Last mammogram 01/2015.

## 2015-02-23 NOTE — Assessment & Plan Note (Signed)
Breathing stable now.  Follow.

## 2015-02-23 NOTE — Assessment & Plan Note (Signed)
EGD as outlined in overview.  Currently without symptoms.  Follow.  Question if the f/u EGD was performed in 2014.  Obtain records.

## 2015-02-23 NOTE — Assessment & Plan Note (Signed)
Reviewed CT recommendation.  Will d/w her regarding f/u CT chest.

## 2015-02-23 NOTE — Assessment & Plan Note (Signed)
Discussed the need for smoking cessation.  She desires not to quit at this time.  Follow.

## 2015-02-23 NOTE — Assessment & Plan Note (Signed)
Her anemia has been felt to be related to iron deficiency and chronic renal disease.  Follow metabolic panel.

## 2015-02-25 ENCOUNTER — Telehealth: Payer: Self-pay | Admitting: *Deleted

## 2015-02-25 NOTE — Telephone Encounter (Signed)
Record request faxed to [facility]

## 2015-02-25 NOTE — Telephone Encounter (Signed)
-----   Message from Einar Pheasant, MD sent at 02/24/2015  2:25 PM EDT ----- Regarding: EGD report and pathology report I need a copy of her EGD and pathology report from Dr Tiffany Kocher.  Was done 2012.  (need any other report after this date as well - if done).  Need info to provide to Dr Bary Castilla.  Thanks   Dr Nicki Reaper

## 2015-02-27 ENCOUNTER — Other Ambulatory Visit: Payer: Self-pay

## 2015-02-27 DIAGNOSIS — R0989 Other specified symptoms and signs involving the circulatory and respiratory systems: Secondary | ICD-10-CM

## 2015-02-28 ENCOUNTER — Other Ambulatory Visit: Payer: Self-pay | Admitting: Internal Medicine

## 2015-02-28 ENCOUNTER — Ambulatory Visit (INDEPENDENT_AMBULATORY_CARE_PROVIDER_SITE_OTHER): Payer: Commercial Managed Care - HMO

## 2015-02-28 DIAGNOSIS — R0989 Other specified symptoms and signs involving the circulatory and respiratory systems: Secondary | ICD-10-CM | POA: Diagnosis not present

## 2015-03-01 NOTE — Telephone Encounter (Signed)
Records received & placed in green folder 

## 2015-03-06 ENCOUNTER — Encounter: Payer: Self-pay | Admitting: Emergency Medicine

## 2015-03-06 ENCOUNTER — Telehealth: Payer: Self-pay | Admitting: *Deleted

## 2015-03-06 ENCOUNTER — Other Ambulatory Visit: Payer: Self-pay | Admitting: Internal Medicine

## 2015-03-06 ENCOUNTER — Observation Stay
Admission: EM | Admit: 2015-03-06 | Discharge: 2015-03-07 | Disposition: A | Discharge status: Home / Self Care | Payer: Commercial Managed Care - HMO | Attending: Internal Medicine | Admitting: Internal Medicine

## 2015-03-06 ENCOUNTER — Other Ambulatory Visit: Payer: Self-pay

## 2015-03-06 ENCOUNTER — Other Ambulatory Visit (INDEPENDENT_AMBULATORY_CARE_PROVIDER_SITE_OTHER): Payer: Commercial Managed Care - HMO

## 2015-03-06 ENCOUNTER — Other Ambulatory Visit
Admission: RE | Admit: 2015-03-06 | Discharge: 2015-03-06 | Disposition: A | Discharge status: Home / Self Care | Payer: Commercial Managed Care - HMO | Source: Ambulatory Visit | Attending: Internal Medicine | Admitting: Internal Medicine

## 2015-03-06 DIAGNOSIS — Z888 Allergy status to other drugs, medicaments and biological substances status: Secondary | ICD-10-CM | POA: Diagnosis not present

## 2015-03-06 DIAGNOSIS — R319 Hematuria, unspecified: Secondary | ICD-10-CM

## 2015-03-06 DIAGNOSIS — Z8261 Family history of arthritis: Secondary | ICD-10-CM | POA: Diagnosis not present

## 2015-03-06 DIAGNOSIS — Z7982 Long term (current) use of aspirin: Secondary | ICD-10-CM | POA: Insufficient documentation

## 2015-03-06 DIAGNOSIS — Z800 Family history of malignant neoplasm of digestive organs: Secondary | ICD-10-CM | POA: Insufficient documentation

## 2015-03-06 DIAGNOSIS — I2510 Atherosclerotic heart disease of native coronary artery without angina pectoris: Secondary | ICD-10-CM | POA: Diagnosis present

## 2015-03-06 DIAGNOSIS — I272 Other secondary pulmonary hypertension: Secondary | ICD-10-CM | POA: Insufficient documentation

## 2015-03-06 DIAGNOSIS — N183 Chronic kidney disease, stage 3 unspecified: Secondary | ICD-10-CM | POA: Diagnosis present

## 2015-03-06 DIAGNOSIS — J449 Chronic obstructive pulmonary disease, unspecified: Secondary | ICD-10-CM | POA: Diagnosis not present

## 2015-03-06 DIAGNOSIS — Z808 Family history of malignant neoplasm of other organs or systems: Secondary | ICD-10-CM | POA: Insufficient documentation

## 2015-03-06 DIAGNOSIS — Z90710 Acquired absence of both cervix and uterus: Secondary | ICD-10-CM | POA: Insufficient documentation

## 2015-03-06 DIAGNOSIS — Z811 Family history of alcohol abuse and dependence: Secondary | ICD-10-CM | POA: Diagnosis not present

## 2015-03-06 DIAGNOSIS — N179 Acute kidney failure, unspecified: Secondary | ICD-10-CM | POA: Diagnosis not present

## 2015-03-06 DIAGNOSIS — M1990 Unspecified osteoarthritis, unspecified site: Secondary | ICD-10-CM | POA: Diagnosis not present

## 2015-03-06 DIAGNOSIS — E785 Hyperlipidemia, unspecified: Secondary | ICD-10-CM | POA: Diagnosis present

## 2015-03-06 DIAGNOSIS — I5022 Chronic systolic (congestive) heart failure: Secondary | ICD-10-CM | POA: Diagnosis present

## 2015-03-06 DIAGNOSIS — D509 Iron deficiency anemia, unspecified: Secondary | ICD-10-CM | POA: Insufficient documentation

## 2015-03-06 DIAGNOSIS — Z79899 Other long term (current) drug therapy: Secondary | ICD-10-CM | POA: Insufficient documentation

## 2015-03-06 DIAGNOSIS — F17210 Nicotine dependence, cigarettes, uncomplicated: Secondary | ICD-10-CM | POA: Insufficient documentation

## 2015-03-06 DIAGNOSIS — E875 Hyperkalemia: Secondary | ICD-10-CM | POA: Diagnosis not present

## 2015-03-06 DIAGNOSIS — Z87892 Personal history of anaphylaxis: Secondary | ICD-10-CM | POA: Diagnosis not present

## 2015-03-06 DIAGNOSIS — Z86010 Personal history of colonic polyps: Secondary | ICD-10-CM | POA: Diagnosis not present

## 2015-03-06 DIAGNOSIS — K219 Gastro-esophageal reflux disease without esophagitis: Secondary | ICD-10-CM | POA: Diagnosis present

## 2015-03-06 DIAGNOSIS — N182 Chronic kidney disease, stage 2 (mild): Secondary | ICD-10-CM | POA: Insufficient documentation

## 2015-03-06 DIAGNOSIS — J410 Simple chronic bronchitis: Secondary | ICD-10-CM | POA: Diagnosis not present

## 2015-03-06 LAB — LIPID PANEL
CHOL/HDL RATIO: 4
Cholesterol: 143 mg/dL (ref 0–200)
HDL: 37.1 mg/dL — ABNORMAL LOW (ref >39.00)
LDL CALC: 88 mg/dL (ref 0–99)
NonHDL: 106.28
Triglycerides: 92 mg/dL (ref 0.0–149.0)
VLDL: 18.4 mg/dL (ref 0.0–40.0)

## 2015-03-06 LAB — HEPATIC FUNCTION PANEL
ALT: 11 U/L (ref 0–35)
AST: 12 U/L (ref 0–37)
Albumin: 3.8 g/dL (ref 3.5–5.2)
Alkaline Phosphatase: 99 U/L (ref 39–117)
BILIRUBIN DIRECT: 0.1 mg/dL (ref 0.0–0.3)
BILIRUBIN TOTAL: 0.6 mg/dL (ref 0.2–1.2)
Total Protein: 6.6 g/dL (ref 6.0–8.3)

## 2015-03-06 LAB — BASIC METABOLIC PANEL
Anion gap: 5 (ref 5–15)
BUN: 35 mg/dL — AB (ref 6–23)
BUN: 34 mg/dL — AB (ref 6–20)
CALCIUM: 9.4 mg/dL (ref 8.4–10.5)
CHLORIDE: 110 mmol/L (ref 101–111)
CO2: 25 mEq/L (ref 19–32)
CO2: 22 mmol/L (ref 22–32)
CREATININE: 2.12 mg/dL — AB (ref 0.44–1.00)
Calcium: 9 mg/dL (ref 8.9–10.3)
Chloride: 108 mEq/L (ref 96–112)
Creatinine, Ser: 1.95 mg/dL — ABNORMAL HIGH (ref 0.40–1.20)
GFR calc Af Amer: 26 mL/min — ABNORMAL LOW (ref >60)
GFR calc non Af Amer: 23 mL/min — ABNORMAL LOW (ref >60)
GFR: 27.05 mL/min — AB (ref >60.00)
GLUCOSE: 115 mg/dL — AB (ref 65–99)
Glucose, Bld: 86 mg/dL (ref 70–99)
POTASSIUM: 6.1 meq/L — AB (ref 3.5–5.1)
Potassium: 6.3 mmol/L — ABNORMAL HIGH (ref 3.5–5.1)
SODIUM: 138 meq/L (ref 135–145)
SODIUM: 137 mmol/L (ref 135–145)

## 2015-03-06 LAB — MAGNESIUM: MAGNESIUM: 1.9 mg/dL (ref 1.7–2.4)

## 2015-03-06 LAB — CBC
HCT: 38.4 % (ref 35.0–47.0)
Hemoglobin: 13 g/dL (ref 12.0–16.0)
MCH: 31.4 pg (ref 26.0–34.0)
MCHC: 33.7 g/dL (ref 32.0–36.0)
MCV: 93.1 fL (ref 80.0–100.0)
PLATELETS: 256 10*3/uL (ref 150–440)
RBC: 4.13 MIL/uL (ref 3.80–5.20)
RDW: 13.5 % (ref 11.5–14.5)
WBC: 7.5 10*3/uL (ref 3.6–11.0)

## 2015-03-06 LAB — CREATININE, SERUM
CREATININE: 2.1 mg/dL — AB (ref 0.44–1.00)
GFR calc Af Amer: 27 mL/min — ABNORMAL LOW (ref >60)
GFR calc non Af Amer: 23 mL/min — ABNORMAL LOW (ref >60)

## 2015-03-06 LAB — PHOSPHORUS: Phosphorus: 4.1 mg/dL (ref 2.5–4.6)

## 2015-03-06 MED ORDER — ACETAMINOPHEN 650 MG RE SUPP: 650.0000 mg | Freq: Four times a day (QID) | RECTAL | Status: DC | PRN

## 2015-03-06 MED ORDER — SODIUM POLYSTYRENE SULFONATE 15 GM/60ML PO SUSP
15.0000 g | Freq: Once | ORAL | Status: AC
  Administered 2015-03-06: 15 g via ORAL
  Filled 2015-03-06: qty 60

## 2015-03-06 MED ORDER — FUROSEMIDE 20 MG PO TABS: 20.0000 mg | ORAL_TABLET | Freq: Every day | ORAL | Status: DC | PRN

## 2015-03-06 MED ORDER — SODIUM CHLORIDE 0.9 % IJ SOLN
3.0000 mL | Freq: Two times a day (BID) | INTRAMUSCULAR | Status: DC
  Administered 2015-03-06: 3 mL via INTRAVENOUS

## 2015-03-06 MED ORDER — CARVEDILOL 3.125 MG PO TABS: 3.1250 mg | ORAL_TABLET | Freq: Two times a day (BID) | ORAL | Status: DC

## 2015-03-06 MED ORDER — ONDANSETRON HCL 4 MG PO TABS: 4.0000 mg | ORAL_TABLET | Freq: Four times a day (QID) | ORAL | Status: DC | PRN

## 2015-03-06 MED ORDER — PANTOPRAZOLE SODIUM 40 MG PO TBEC
40.0000 mg | DELAYED_RELEASE_TABLET | Freq: Every day | ORAL | Status: DC
  Administered 2015-03-07: 40 mg via ORAL
  Filled 2015-03-06: qty 1

## 2015-03-06 MED ORDER — TIOTROPIUM BROMIDE MONOHYDRATE 18 MCG IN CAPS
18.0000 ug | ORAL_CAPSULE | Freq: Every day | RESPIRATORY_TRACT | Status: DC
  Administered 2015-03-07: 18 ug via RESPIRATORY_TRACT
  Filled 2015-03-06: qty 5

## 2015-03-06 MED ORDER — SIMVASTATIN 10 MG PO TABS
10.0000 mg | ORAL_TABLET | Freq: Every day | ORAL | Status: DC
  Administered 2015-03-06: 10 mg via ORAL
  Filled 2015-03-06: qty 1

## 2015-03-06 MED ORDER — HEPARIN SODIUM (PORCINE) 5000 UNIT/ML IJ SOLN
5000.0000 [IU] | Freq: Three times a day (TID) | INTRAMUSCULAR | Status: DC
Start: 2015-03-06 — End: 2015-03-07
  Administered 2015-03-06 – 2015-03-07 (×2): 5000 [IU] via SUBCUTANEOUS
  Filled 2015-03-06 (×2): qty 1

## 2015-03-06 MED ORDER — ONDANSETRON HCL 4 MG/2ML IJ SOLN: 4.0000 mg | Freq: Four times a day (QID) | INTRAMUSCULAR | Status: DC | PRN

## 2015-03-06 MED ORDER — ACETAMINOPHEN 325 MG PO TABS: 650.0000 mg | ORAL_TABLET | Freq: Four times a day (QID) | ORAL | Status: DC | PRN

## 2015-03-06 MED ORDER — ASPIRIN EC 81 MG PO TBEC
81.0000 mg | DELAYED_RELEASE_TABLET | Freq: Every day | ORAL | Status: DC
  Administered 2015-03-07: 81 mg via ORAL
  Filled 2015-03-06: qty 1

## 2015-03-06 NOTE — H&P (Signed)
[facility] NAME: Sandra Ellison    MR#:  [id number]  DATE OF BIRTH:  1946-02-24  DATE OF ADMISSION:  03/06/2015  PRIMARY CARE PHYSICIAN: Einar Pheasant, MD   REQUESTING/REFERRING PHYSICIAN: Archie Balboa, M.D.  CHIEF COMPLAINT:   Chief Complaint  Patient presents with  . Abnormal Lab    HISTORY OF PRESENT ILLNESS:  Sandra Ellison  is a 69 y.o. female who presents with hyperkalemia. Patient had routine labs drawn by her PCP and was noted to have a high potassium. She was called today by her PCP to come to the ED to have it rechecked. On recheck in the ED her potassium was still elevated 6.3. No EKG changes noted, patient is asymptomatic. Patient was recently started on Entresto by her cardiologist, and states that elevated potassium is listed as well as side effects. Hospitalists were called for admission for hyperkalemia  PAST MEDICAL HISTORY:   Past Medical History  Diagnosis Date  . CHF (congestive heart failure)   . GERD (gastroesophageal reflux disease)   . COPD (chronic obstructive pulmonary disease)   . Hx of colonic polyps   . Iron deficiency anemia   . Chronic kidney disease (CKD), stage II (mild)   . Pulmonary HTN   . Arthritis   . Allergy   . History of blood transfusion   . Diverticulosis     H/O  . History of abnormal Pap smear     s/p vaginal hysterectomy    PAST SURGICAL HISTORY:   Past Surgical History  Procedure Laterality Date  . Tonsillectomy  age 85  . Vaginal hysterectomy  1981    secondary to abnormal pap smear  . Orif distal radius fracture  2011    right  . Colonoscopy  10/2010    SOCIAL HISTORY:   Social History  Substance Use Topics  . Smoking status: Current Every Day Smoker -- 0.25 packs/day for 42 years    Types: Cigarettes  . Smokeless tobacco: Never Used     Comment: Quit early December 2015  . Alcohol Use: No    FAMILY HISTORY:   Family History  Problem Relation Age  of Onset  . Arthritis Mother   . Alcohol abuse Father   . Arthritis Father   . Cancer Father     throat/spine  . Breast cancer Neg Hx     DRUG ALLERGIES:   Allergies  Allergen Reactions  . Ranitidine Hcl Anaphylaxis and Swelling    MEDICATIONS AT HOME:   Prior to Admission medications   Medication Sig Start Date End Date Taking? Authorizing Provider  aspirin 81 MG tablet Take 81 mg by mouth daily.      Historical Provider, MD  carvedilol (COREG) 3.125 MG tablet TAKE 1 TABLET (3.125 MG TOTAL) BY MOUTH 2 (TWO) TIMES DAILY WITH A MEAL. 10/08/14   Einar Pheasant, MD  ferrous fumarate (HEMOCYTE - 106 MG FE) 325 (106 FE) MG TABS tablet Take 2 tablets by mouth daily.     Historical Provider, MD  furosemide (LASIX) 20 MG tablet Take 1 tablet (20 mg total) by mouth daily as needed. 05/21/14   Minna Merritts, MD  gabapentin (NEURONTIN) 100 MG capsule TAKE 1 CAPSULE (100 MG TOTAL) BY MOUTH 3 (THREE) TIMES DAILY AS NEEDED. 02/11/15   Einar Pheasant, MD  gabapentin (NEURONTIN) 300 MG capsule TAKE ONE CAPSULE BY MOUTH 3 TIMES A DAY AS NEEDED 11/26/14   Einar Pheasant, MD  gabapentin (NEURONTIN)  300 MG capsule TAKE ONE CAPSULE BY MOUTH 3 TIMES A DAY AS NEEDED 02/28/15   Einar Pheasant, MD  pantoprazole (PROTONIX) 40 MG tablet Take 1 tablet (40 mg total) by mouth daily. 02/21/15   Einar Pheasant, MD  polyethylene glycol powder (GLYCOLAX/MIRALAX) powder 255 grams one bottle for colonoscopy prep 02/14/15   [name], MD  PROAIR HFA 108 (90 BASE) MCG/ACT inhaler INHALE 2 PUFFS INTO THE LUNGS EVERY 6 (SIX) HOURS AS NEEDED FOR WHEEZING. Patient not taking: Reported on 02/21/2015 06/04/14   Einar Pheasant, MD  sacubitril-valsartan (ENTRESTO) 49-51 MG Take 1 tablet by mouth 2 (two) times daily. 01/21/15   Minna Merritts, MD  simvastatin (ZOCOR) 10 MG tablet Take 1 tablet (10 mg total) by mouth at bedtime. 02/21/15   Einar Pheasant, MD  SPIRIVA HANDIHALER 18 MCG inhalation capsule INHALE CONTENTS OF 1  CAPSULE VIA HANDIHALER AS NEEDED 12/03/14   Einar Pheasant, MD  varenicline (CHANTIX CONTINUING MONTH PAK) 1 MG tablet Take 1 tablet (1 mg total) by mouth 2 (two) times daily. Patient not taking: Reported on 02/21/2015 01/10/15   Minna Merritts, MD    REVIEW OF SYSTEMS:  Review of Systems  Constitutional: Negative for fever, chills, weight loss and malaise/fatigue.  HENT: Negative for ear pain, hearing loss and tinnitus.   Eyes: Negative for blurred vision, double vision, pain and redness.  Respiratory: Negative for cough, hemoptysis and shortness of breath.   Cardiovascular: Negative for chest pain, palpitations, orthopnea and leg swelling.  Gastrointestinal: Negative for nausea, vomiting, abdominal pain, diarrhea and constipation.  Genitourinary: Negative for dysuria, frequency and hematuria.  Musculoskeletal: Negative for back pain, joint pain and neck pain.  Skin:       No acne, rash, or lesions  Neurological: Negative for dizziness, tremors, focal weakness and weakness.  Endo/Heme/Allergies: Negative for polydipsia. Does not bruise/bleed easily.  Psychiatric/Behavioral: Negative for depression. The patient is not nervous/anxious and does not have insomnia.      VITAL SIGNS:   Filed Vitals:   03/06/15 1847 03/06/15 1848 03/06/15 1900 03/06/15 1915  BP: 164/69  149/70   Pulse:  79 76 75  Temp:      TempSrc:      Resp:  14 24 24   Height:      Weight:      SpO2:  98% 98% 98%   Wt Readings from Last 3 Encounters:  03/06/15 70.308 kg (155 lb)  02/21/15 75.751 kg (167 lb)  01/24/15 74.753 kg (164 lb 12.8 oz)    PHYSICAL EXAMINATION:  Physical Exam  Vitals reviewed. Constitutional: She is oriented to person, place, and time. She appears well-developed and well-nourished. No distress.  HENT:  Head: Normocephalic and atraumatic.  Mouth/Throat: Oropharynx is clear and moist.  Eyes: Conjunctivae and EOM are normal. Pupils are equal, round, and reactive to light. No scleral  icterus.  Neck: Normal range of motion. Neck supple. No JVD present. No thyromegaly present.  Cardiovascular: Normal rate, regular rhythm and intact distal pulses.  Exam reveals no gallop and no friction rub.   No murmur heard. Respiratory: Effort normal and breath sounds normal. No respiratory distress. She has no wheezes. She has no rales.  GI: Soft. Bowel sounds are normal. She exhibits no distension. There is no tenderness.  Musculoskeletal: Normal range of motion. She exhibits no edema.  No arthritis, no gout  Lymphadenopathy:    She has no cervical adenopathy.  Neurological: She is alert and oriented to person, place, and  time. No cranial nerve deficit.  No dysarthria, no aphasia  Skin: Skin is warm and dry. No rash noted. No erythema.  Psychiatric: She has a normal mood and affect. Her behavior is normal. Judgment and thought content normal.    LABORATORY PANEL:   CBC No results for input(s): WBC, HGB, HCT, PLT in the last 168 hours. ------------------------------------------------------------------------------------------------------------------  Chemistries   Recent Labs Lab 03/06/15 1550  NA 137  K 6.3*  CL 110  CO2 22  GLUCOSE 115*  BUN 34*  CREATININE 2.12*  CALCIUM 9.0   ------------------------------------------------------------------------------------------------------------------  Cardiac Enzymes No results for input(s): TROPONINI in the last 168 hours. ------------------------------------------------------------------------------------------------------------------  RADIOLOGY:  No results found.  EKG:   Orders placed or performed during the hospital encounter of 03/06/15  . ED EKG  . ED EKG    IMPRESSION AND PLAN:  Principal Problem:   Hyperkalemia - patient is hemodynamically stable, she was given Kayexalate in the ED. We'll bring her in for observation, and recheck her labs in the morning. We'll keep on cardiac monitoring for now. Active  Problems:   CAD (coronary artery disease) - stable, continue home meds   COPD (chronic obstructive pulmonary disease) - stable, continue inhalers   Chronic systolic CHF (congestive heart failure) - stable, continue home meds   CKD (chronic kidney disease), stage IV - avoid nephrotoxins, monitor, no acute change in renal function   Hyperlipidemia - stable, continue home meds   GERD (gastroesophageal reflux disease) - equivalent home dose PPI  All the records are reviewed and case discussed with ED provider. Management plans discussed with the patient and/or family.  DVT PROPHYLAXIS: SubQ heparin  ADMISSION STATUS: Observation  CODE STATUS: Full  TOTAL TIME TAKING CARE OF THIS PATIENT: 40 minutes.    WILLIS, DAVID FIELDING 03/06/2015, 8:47 PM  [facility]  Office  [phone]  CC: Primary care physician; Einar Pheasant, MD

## 2015-03-06 NOTE — Telephone Encounter (Signed)
Thank you for checking her labs Surprising finding. Wonder if she has been taking more Lasix recently with potassium. We will monitor. Perhaps when renal function back to her baseline, will restart ARB as she was taking in the past

## 2015-03-06 NOTE — Telephone Encounter (Signed)
Critical Potassium 6.1

## 2015-03-06 NOTE — Telephone Encounter (Signed)
Dr Nicki Reaper (pt PCP) calling to let us know that pt came into their office today to do routine blood work and that they got the results Pt Creatinine was 1.95 normal for her is 1.4, Potassium is 6.1 and that she has told pt to hold this medication until the do a re-check She suggested that we try and put her on another medication, may it be back to her ARB.  Her levels before she changed medication were fine When we make the change, please do also let Dr Nicki Reaper know.  Please advise.

## 2015-03-06 NOTE — Telephone Encounter (Signed)
Creatine is going up even more, thus Dr Nicki Reaper is sending pt to ED. Just wanted to make [place] aware.

## 2015-03-06 NOTE — Telephone Encounter (Signed)
Called pt and notified her of lab results.  Cardiology recently started entresto.  Cr has increased as well.  Will stop entresto.  Will recheck potassium stat today.  Kayexalate if still high.  Pt to call back and let me know if [facility] recheck potassium.

## 2015-03-06 NOTE — Progress Notes (Signed)
Order placed for stat met b

## 2015-03-06 NOTE — ED Provider Notes (Signed)
[facility] Emergency Department Provider Note    ____________________________________________  Time seen: 1820  I have reviewed the triage vital signs and the nursing notes.   HISTORY  Chief Complaint Abnormal Lab   History limited by: Not Limited   HPI Sandra Ellison is a 69 y.o. female who presents to the emergency department today with concerns for hyperkalemia. The patient states that she had blood work done yesterday and was told to have her blood rechecked today because of high potassium. On recheck today the potassium was even greater. Patient does state she was started on a new medication recently that has the side effect of elevated potassium. Patient herself denies any symptoms however. She denies any chest pain, shortness of breath, headache, weakness. She denies any palpitations.She denies ever having hyperkalemia in the past.     Past Medical History  Diagnosis Date  . CHF (congestive heart failure)   . GERD (gastroesophageal reflux disease)   . COPD (chronic obstructive pulmonary disease)   . Hx of colonic polyps   . Iron deficiency anemia   . Chronic kidney disease (CKD), stage II (mild)   . Pulmonary HTN   . Arthritis   . Allergy   . History of blood transfusion   . Diverticulosis     H/O  . History of abnormal Pap smear     s/p vaginal hysterectomy    Patient Active Problem List   Diagnosis Date Noted  . Abnormal mammogram 02/23/2015  . Encounter for screening colonoscopy 01/26/2015  . Health care maintenance 08/12/2014  . Acute bronchitis 05/21/2014  . Chronic systolic CHF (congestive heart failure) 05/21/2014  . Herpes zoster 02/01/2014  . Microcalcifications of the breast 01/24/2014  . Knee pain, bilateral 07/22/2013  . Hematuria 05/20/2013  . Pulmonary nodule 05/20/2013  . Insomnia 03/08/2013  . Left knee pain 03/08/2013  . Ulnar nerve injury 03/07/2013  . GERD (gastroesophageal reflux disease) 02/28/2013  .  History of colonic polyps 02/28/2013  . Diverticulosis 02/28/2013  . Anemia 02/28/2013  . COPD (chronic obstructive pulmonary disease) 02/28/2013  . Pulmonary hypertension 02/28/2013  . Nonischemic cardiomyopathy 02/26/2012  . Carotid bruit 02/26/2012  . SOB (shortness of breath) 03/03/2011  . Smoking 03/03/2011  . Hyperlipidemia 03/03/2011  . CAD (coronary artery disease) 03/03/2011    Past Surgical History  Procedure Laterality Date  . Tonsillectomy  age 41  . Vaginal hysterectomy  1981    secondary to abnormal pap smear  . Orif distal radius fracture  2011    right  . Colonoscopy  10/2010    Current Outpatient Rx  Name  Route  Sig  Dispense  Refill  . aspirin 81 MG tablet   Oral   Take 81 mg by mouth daily.           . carvedilol (COREG) 3.125 MG tablet      TAKE 1 TABLET (3.125 MG TOTAL) BY MOUTH 2 (TWO) TIMES DAILY WITH A MEAL.   60 tablet   5   . ferrous fumarate (HEMOCYTE - 106 MG FE) 325 (106 FE) MG TABS tablet   Oral   Take 2 tablets by mouth daily.          . furosemide (LASIX) 20 MG tablet   Oral   Take 1 tablet (20 mg total) by mouth daily as needed.   30 tablet   6   . gabapentin (NEURONTIN) 100 MG capsule      TAKE 1 CAPSULE (100 MG  TOTAL) BY MOUTH 3 (THREE) TIMES DAILY AS NEEDED.   90 capsule   1   . gabapentin (NEURONTIN) 300 MG capsule      TAKE ONE CAPSULE BY MOUTH 3 TIMES A DAY AS NEEDED   90 capsule   5   . gabapentin (NEURONTIN) 300 MG capsule      TAKE ONE CAPSULE BY MOUTH 3 TIMES A DAY AS NEEDED   90 capsule   1   . gabapentin (NEURONTIN) 300 MG capsule      TAKE ONE CAPSULE BY MOUTH 3 TIMES A DAY AS NEEDED   90 capsule   1   . pantoprazole (PROTONIX) 40 MG tablet   Oral   Take 1 tablet (40 mg total) by mouth daily.   30 tablet   3   . polyethylene glycol powder (GLYCOLAX/MIRALAX) powder      255 grams one bottle for colonoscopy prep   255 g   0   . PROAIR HFA 108 (90 BASE) MCG/ACT inhaler      INHALE 2 PUFFS  INTO THE LUNGS EVERY 6 (SIX) HOURS AS NEEDED FOR WHEEZING. Patient not taking: Reported on 02/21/2015   8.5 each   2   . sacubitril-valsartan (ENTRESTO) 49-51 MG   Oral   Take 1 tablet by mouth 2 (two) times daily.   60 tablet   6   . simvastatin (ZOCOR) 10 MG tablet   Oral   Take 1 tablet (10 mg total) by mouth at bedtime.   90 tablet   1   . simvastatin (ZOCOR) 10 MG tablet      TAKE 1 TABLET BY MOUTH AT BEDTIME   90 tablet   1   . SPIRIVA HANDIHALER 18 MCG inhalation capsule      INHALE CONTENTS OF 1 CAPSULE VIA HANDIHALER AS NEEDED   30 capsule   5   . varenicline (CHANTIX CONTINUING MONTH PAK) 1 MG tablet   Oral   Take 1 tablet (1 mg total) by mouth 2 (two) times daily. Patient not taking: Reported on 02/21/2015   56 tablet   6     Allergies Ranitidine hcl  Family History  Problem Relation Age of Onset  . Arthritis Mother   . Alcohol abuse Father   . Arthritis Father   . Cancer Father     throat/spine  . Breast cancer Neg Hx     Social History Social History  Substance Use Topics  . Smoking status: Current Every Day Smoker -- 0.25 packs/day for 42 years    Types: Cigarettes  . Smokeless tobacco: Never Used     Comment: Quit early December 2015  . Alcohol Use: No    Review of Systems  Constitutional: Negative for fever. Cardiovascular: Negative for chest pain. Respiratory: Negative for shortness of breath. Gastrointestinal: Negative for abdominal pain, vomiting and diarrhea. Genitourinary: Negative for dysuria. Musculoskeletal: Negative for back pain. Skin: Negative for rash. Neurological: Negative for headaches, focal weakness or numbness.  10-point ROS otherwise negative.  ____________________________________________   PHYSICAL EXAM:  VITAL SIGNS: ED Triage Vitals  Enc Vitals Group     BP 03/06/15 1750 123/70 mmHg     Pulse Rate 03/06/15 1750 92     Resp 03/06/15 1750 16     Temp 03/06/15 1750 97.8 F (36.6 C)     Temp Source  03/06/15 1750 Oral     SpO2 03/06/15 1750 96 %     Weight 03/06/15 1750 155 lb (70.308  kg)     Height 03/06/15 1750 5\' 5"  (1.651 m)     Head Cir --      Peak Flow --      Pain Score 03/06/15 1751 0   Constitutional: Alert and oriented. Well appearing and in no distress. Eyes: Conjunctivae are normal. PERRL. Normal extraocular movements. ENT   Head: Normocephalic and atraumatic.   Nose: No congestion/rhinnorhea.   Mouth/Throat: Mucous membranes are moist.   Neck: No stridor. Hematological/Lymphatic/Immunilogical: No cervical lymphadenopathy. Cardiovascular: Normal rate, regular rhythm.  No murmurs, rubs, or gallops. Respiratory: Normal respiratory effort without tachypnea nor retractions. Breath sounds are clear and equal bilaterally. No wheezes/rales/rhonchi. Gastrointestinal: Soft and nontender. No distention. There is no CVA tenderness. Genitourinary: Deferred Musculoskeletal: Normal range of motion in all extremities. No joint effusions.  No lower extremity tenderness nor edema. Neurologic:  Normal speech and language. No gross focal neurologic deficits are appreciated. Speech is normal.  Skin:  Skin is warm, dry and intact. No rash noted. Psychiatric: Mood and affect are normal. Speech and behavior are normal. Patient exhibits appropriate insight and judgment.  ____________________________________________    LABS (pertinent positives/negatives)  Potassium: 6.33 ____________________________________________   EKG  I, Nance Pear, attending physician, personally viewed and interpreted this EKG  EKG Time: 1803 Rate: 86 Rhythm: NSR Axis: left axis deviation Intervals: qtc 457 QRS: narrow ST changes: no st elevation, no peaked t waves Impression: abnormal ekg  ____________________________________________    RADIOLOGY  None   ____________________________________________   PROCEDURES  Procedure(s) performed: None  Critical Care performed:  No  ____________________________________________   INITIAL IMPRESSION / ASSESSMENT AND PLAN / ED COURSE  Pertinent labs & imaging results that were available during my care of the patient were reviewed by me and considered in my medical decision making (see chart for details).  Patient here because of high potassium noted on lab work. EKG without any concerning findings. Patient asymptomatic. Will plan on giving kayexalate and admitting for further work up and management.   ____________________________________________   FINAL CLINICAL IMPRESSION(S) / ED DIAGNOSES  Final diagnoses:  Hyperkalemia     Nance Pear, MD 03/06/15 2042

## 2015-03-06 NOTE — ED Notes (Signed)
Pt here from PCP, Dr Nicki Reaper. Pt reports had labs done and potassium was high.

## 2015-03-06 NOTE — Telephone Encounter (Signed)
FYI.  She told me she had not been taking any potassium.  I was surprised also.  Her Cr was 2.2 and her potassium 6.3.  I had initially planned to give her kayexalate.  With her creatinine elevated, I was a little concerned about increased diarrhea (with possible increased volume depletion) worsening her kidney function.  I thought it would be best to be monitored while they are treating.  She agreed to go over to the ER.

## 2015-03-06 NOTE — Telephone Encounter (Signed)
On recheck K+ 6.3.  Dr. Bary Leriche office advised pt to go to ED.

## 2015-03-07 ENCOUNTER — Telehealth: Payer: Self-pay

## 2015-03-07 ENCOUNTER — Other Ambulatory Visit: Payer: Self-pay

## 2015-03-07 ENCOUNTER — Other Ambulatory Visit: Payer: Self-pay | Admitting: Internal Medicine

## 2015-03-07 ENCOUNTER — Telehealth: Payer: Self-pay | Admitting: Internal Medicine

## 2015-03-07 DIAGNOSIS — I272 Other secondary pulmonary hypertension: Secondary | ICD-10-CM | POA: Diagnosis not present

## 2015-03-07 DIAGNOSIS — K219 Gastro-esophageal reflux disease without esophagitis: Secondary | ICD-10-CM | POA: Diagnosis not present

## 2015-03-07 DIAGNOSIS — I5022 Chronic systolic (congestive) heart failure: Secondary | ICD-10-CM | POA: Diagnosis not present

## 2015-03-07 DIAGNOSIS — J449 Chronic obstructive pulmonary disease, unspecified: Secondary | ICD-10-CM | POA: Diagnosis not present

## 2015-03-07 DIAGNOSIS — N182 Chronic kidney disease, stage 2 (mild): Secondary | ICD-10-CM | POA: Diagnosis not present

## 2015-03-07 DIAGNOSIS — E875 Hyperkalemia: Secondary | ICD-10-CM

## 2015-03-07 DIAGNOSIS — E785 Hyperlipidemia, unspecified: Secondary | ICD-10-CM | POA: Diagnosis not present

## 2015-03-07 DIAGNOSIS — I2510 Atherosclerotic heart disease of native coronary artery without angina pectoris: Secondary | ICD-10-CM | POA: Diagnosis not present

## 2015-03-07 DIAGNOSIS — N179 Acute kidney failure, unspecified: Secondary | ICD-10-CM | POA: Diagnosis not present

## 2015-03-07 LAB — CBC
HEMATOCRIT: 35.6 % (ref 35.0–47.0)
HEMOGLOBIN: 12.2 g/dL (ref 12.0–16.0)
MCH: 31.9 pg (ref 26.0–34.0)
MCHC: 34.3 g/dL (ref 32.0–36.0)
MCV: 93 fL (ref 80.0–100.0)
Platelets: 228 10*3/uL (ref 150–440)
RBC: 3.83 MIL/uL (ref 3.80–5.20)
RDW: 13.4 % (ref 11.5–14.5)
WBC: 5.6 10*3/uL (ref 3.6–11.0)

## 2015-03-07 LAB — BASIC METABOLIC PANEL
ANION GAP: 7 (ref 5–15)
Anion gap: 7 (ref 5–15)
BUN: 37 mg/dL — AB (ref 6–20)
BUN: 33 mg/dL — ABNORMAL HIGH (ref 6–20)
CALCIUM: 8.6 mg/dL — AB (ref 8.9–10.3)
CHLORIDE: 112 mmol/L — AB (ref 101–111)
CHLORIDE: 109 mmol/L (ref 101–111)
CO2: 24 mmol/L (ref 22–32)
CO2: 22 mmol/L (ref 22–32)
CREATININE: 2.06 mg/dL — AB (ref 0.44–1.00)
Calcium: 8.6 mg/dL — ABNORMAL LOW (ref 8.9–10.3)
Creatinine, Ser: 2.13 mg/dL — ABNORMAL HIGH (ref 0.44–1.00)
GFR calc Af Amer: 26 mL/min — ABNORMAL LOW (ref >60)
GFR calc non Af Amer: 23 mL/min — ABNORMAL LOW (ref >60)
GFR calc non Af Amer: 24 mL/min — ABNORMAL LOW (ref >60)
GFR, EST AFRICAN AMERICAN: 27 mL/min — AB (ref >60)
GLUCOSE: 94 mg/dL (ref 65–99)
GLUCOSE: 109 mg/dL — AB (ref 65–99)
POTASSIUM: 5.5 mmol/L — AB (ref 3.5–5.1)
Potassium: 4.8 mmol/L (ref 3.5–5.1)
Sodium: 143 mmol/L (ref 135–145)
Sodium: 138 mmol/L (ref 135–145)

## 2015-03-07 MED ORDER — INSULIN ASPART 100 UNIT/ML IV SOLN
10.0000 [IU] | Freq: Once | INTRAVENOUS | Status: AC
  Administered 2015-03-07: 10 [IU] via INTRAVENOUS
  Filled 2015-03-07: qty 0.1

## 2015-03-07 MED ORDER — SODIUM POLYSTYRENE SULFONATE 15 GM/60ML PO SUSP
30.0000 g | Freq: Once | ORAL | Status: AC
  Administered 2015-03-07: 30 g via ORAL
  Filled 2015-03-07: qty 120

## 2015-03-07 MED ORDER — DEXTROSE 50 % IV SOLN
25.0000 mL | Freq: Once | INTRAVENOUS | Status: AC
  Administered 2015-03-07: 25 mL via INTRAVENOUS
  Filled 2015-03-07: qty 50

## 2015-03-07 MED ORDER — SODIUM CHLORIDE 0.9 % IV SOLN
INTRAVENOUS | Status: DC
  Administered 2015-03-07: 09:00:00 via INTRAVENOUS

## 2015-03-07 NOTE — Discharge Summary (Signed)
[facility] [place] NAME: Sandra Ellison    MR#:  [id number]  DATE OF BIRTH:  [birth year]  DATE OF ADMISSION:  03/06/2015 ADMITTING PHYSICIAN: Lance Coon, MD  DATE OF DISCHARGE: 03/07/2015 PRIMARY CARE PHYSICIAN: Einar Pheasant, MD    ADMISSION DIAGNOSIS:  Hyperkalemia [E87.5]  DISCHARGE DIAGNOSIS:  Principal Problem:   Hyperkalemia Active Problems:   Hyperlipidemia   CAD (coronary artery disease)   GERD (gastroesophageal reflux disease)   COPD (chronic obstructive pulmonary disease)   Chronic systolic CHF (congestive heart failure)   CKD (chronic kidney disease), stage IV   SECONDARY DIAGNOSIS:   Past Medical History  Diagnosis Date  . CHF (congestive heart failure)   . GERD (gastroesophageal reflux disease)   . COPD (chronic obstructive pulmonary disease)   . Hx of colonic polyps   . Iron deficiency anemia   . Chronic kidney disease (CKD), stage II (mild)   . Pulmonary HTN   . Arthritis   . Allergy   . History of blood transfusion   . Diverticulosis     H/O  . History of abnormal Pap smear     s/p vaginal hysterectomy    HOSPITAL COURSE:  This is a very pleasant 69 year old female with a history of congestive heart failure, COPD who presents with hyperkalemia. Further details please further H&P.  1. Hyperkalemia: I suspect this is related to her acute renal failure. She was also taking ENTRESTO which can cause hyperkalemia. Potassium was treated with Kayexalate, D50 and insulin.  2. Acute renal failure: This is secondary to combination of using Lasix and ENTRESTO. Patient needs to continue to hold nephrotoxic agents and follow-up with a repeat BMP on Friday by her primary care physician.  3. Coronary artery disease: This was stable. Patient continue home medications.  4 chronic systolic heart failure: Patient will stop taking Lasix and and ENTRESTO now due to problem #2 and follow-up with Dr. Rockey Situ in her  cardiologist in 1 week.  5. Hyperlipidemia: Continue simvastatin  6. GERD: Continue PPI  DISCHARGE CONDITIONS AND DIET:  Patient can be discharged home in stable condition to heart healthy diet  CONSULTS OBTAINED:     DRUG ALLERGIES:   Allergies  Allergen Reactions  . Ranitidine Hcl Anaphylaxis and Swelling    DISCHARGE MEDICATIONS:   Current Discharge Medication List    CONTINUE these medications which have NOT CHANGED   Details  aspirin EC 81 MG tablet Take 81 mg by mouth daily.    carvedilol (COREG) 3.125 MG tablet Take 3.125 mg by mouth 2 (two) times daily.    ferrous fumarate (HEMOCYTE - 106 MG FE) 325 (106 FE) MG TABS tablet Take 2 tablets by mouth daily.     !! gabapentin (NEURONTIN) 100 MG capsule Take 100 mg by mouth 3 (three) times daily as needed (for pain). Pt takes with a 300mg  capsule.    !! gabapentin (NEURONTIN) 300 MG capsule Take 300 mg by mouth 3 (three) times daily as needed (for pain). Pt takes with a 100mg  capsule.    pantoprazole (PROTONIX) 40 MG tablet Take 1 tablet (40 mg total) by mouth daily. Qty: 30 tablet, Refills: 3    polyethylene glycol (MIRALAX / GLYCOLAX) packet Take 17 g by mouth daily as needed for mild constipation.    simvastatin (ZOCOR) 10 MG tablet Take 1 tablet (10 mg total) by mouth at bedtime. Qty: 90 tablet, Refills: 1    tiotropium (SPIRIVA) 18 MCG inhalation capsule  Place 18 mcg into inhaler and inhale daily.     !! - Potential duplicate medications found. Please discuss with provider.    STOP taking these medications     furosemide (LASIX) 20 MG tablet      sacubitril-valsartan (ENTRESTO) 49-51 MG      aspirin 81 MG tablet               Today   CHIEF COMPLAINT:  She is doing well this morning. Patient denies chest pain or weakness.   VITAL SIGNS:  Blood pressure 131/52, pulse 76, temperature 97.6 F (36.4 C), temperature source Oral, resp. rate 16, height 5\' 5"  (1.651 m), weight 70.308 kg (155 lb),  SpO2 98 %.   REVIEW OF SYSTEMS:  Review of Systems  Constitutional: Negative for fever, chills and malaise/fatigue.  HENT: Negative for sore throat.   Eyes: Negative for blurred vision.  Respiratory: Negative for cough, hemoptysis, shortness of breath and wheezing.   Cardiovascular: Negative for chest pain, palpitations and leg swelling.  Gastrointestinal: Negative for nausea, vomiting, abdominal pain, diarrhea and blood in stool.  Genitourinary: Negative for dysuria.  Musculoskeletal: Negative for back pain.  Neurological: Negative for dizziness, tremors and headaches.  Endo/Heme/Allergies: Does not bruise/bleed easily.     PHYSICAL EXAMINATION:  GENERAL:  69 y.o.-year-old patient lying in the bed with no acute distress.  NECK:  Supple, no jugular venous distention. No thyroid enlargement, no tenderness.  LUNGS: Normal breath sounds bilaterally, no wheezing, rales,rhonchi  No use of accessory muscles of respiration.  CARDIOVASCULAR: S1, S2 normal. No murmurs, rubs, or gallops.  ABDOMEN: Soft, non-tender, non-distended. Bowel sounds present. No organomegaly or mass.  EXTREMITIES: No pedal edema, cyanosis, or clubbing.  PSYCHIATRIC: The patient is alert and oriented x 3.  SKIN: No obvious rash, lesion, or ulcer.   DATA REVIEW:   CBC  Recent Labs Lab 03/07/15 0613  WBC 5.6  HGB 12.2  HCT 35.6  PLT 228    Chemistries   Recent Labs Lab 03/06/15 2231 03/07/15 0613  NA  --  143  K  --  5.5*  CL  --  112*  CO2  --  24  GLUCOSE  --  94  BUN  --  37*  CREATININE 2.10* 2.13*  CALCIUM  --  8.6*  MG 1.9  --     Cardiac Enzymes No results for input(s): TROPONINI in the last 168 hours.  Microbiology Results  @MICRORSLT48 @  RADIOLOGY:  No results found.    Management plans discussed with the patient and she is in agreement. Stable for discharge home  Patient should follow up with PCP within 2 days  CODE STATUS:     Code Status Orders        Start      Ordered   03/06/15 2136  Full code   Continuous     03/06/15 2135    Advance Directive Documentation        Most Recent Value   Type of Advance Directive  Healthcare Power of Attorney   Pre-existing out of facility DNR order (yellow form or pink MOST form)     "MOST" Form in Place?        TOTAL TIME TAKING CARE OF THIS PATIENT: 35 minutes.    MODY, SITAL M.D on 03/07/2015 at 1:01 PM  Between 7am to 6pm - Pager - [phone] After 6pm go to [email or web address] - password [email or web address] [facility]  [facility]  Office  [phone]  CC: Primary  care physician; Einar Pheasant, MD

## 2015-03-07 NOTE — Telephone Encounter (Signed)
Attempted to contact pt regarding discharge from [facility] on 03/07/15. Left message on pt's vm asking her to call back w/ any questions or concerns regarding medications and/or discharge instructions.  Advised her of appt w/ Ignacia Bayley, NP on 03/21/15 @ 2:00. Asked her to call back if she will be unable to keep this appt.

## 2015-03-07 NOTE — Telephone Encounter (Signed)
Medication says discontinued and there is no refill history on the medication when clicked on to see who or if its been refilled. Please advise?

## 2015-03-07 NOTE — Telephone Encounter (Signed)
Thank you for taking care of this and scheduling the lab.

## 2015-03-07 NOTE — Telephone Encounter (Signed)
Appointment OK per Dr. Nicki Reaper.  I will schedule when placing TCM call.

## 2015-03-07 NOTE — Telephone Encounter (Signed)
Transition Care Management Follow-up Telephone Call   Date discharged? 03/07/15        How have you been since you were released from the hospital? Doing very well. Denies chest pain and weakness.   Do you understand why you were in the hospital? Yes   Do you understand the discharge instructions? Yes   Where were you discharged to? Home   Items Reviewed:  Medications reviewed: Yes  Allergies reviewed: Yes  Dietary changes reviewed: Yes  Referrals reviewed: Yes   Functional Questionnaire:   Activities of Daily Living (ADLs):   She states they are independent in the following: Independent in all ADLs. States they require assistance with the following: Does not require assistance at this time.   Any transportation issues/concerns?: Yes, but grand daughter will be able to bring.   Any patient concerns? Not at this time.   Confirmed importance and date/time of follow-up visits scheduled Yes, non-fasting labs scheduled 03/08/15, HFU appointment scheduled for 03/11/15.  [facility] Appointment booked with Dr. Nicki Reaper (PCP).  Confirmed with patient if condition begins to worsen call PCP or go to the ER.  Patient was given the office number and encouraged to call back with question or concerns.  : Yes, patient verbalized understanding.

## 2015-03-07 NOTE — Telephone Encounter (Signed)
Pt is being discharged from hospital 09/29. Diagnosis is Hyperkalemia. Can I schedule pt on 10/3 @8am ? Let me know.  Bright!

## 2015-03-07 NOTE — Progress Notes (Signed)
Repeat results of K+ called to Md/ ok to discharge/ discharge instructions explained to pt/ iv and tele removed/ will transport off unit when ride arrives

## 2015-03-07 NOTE — Telephone Encounter (Signed)
It appears that we have not been refilling this medication.  Has it been filled by cardiology?  I do not mind refilling if she has been taking, but would like for cardiology to continue to refill if they have been refilling.  Pt is currently in hospital now.  Can check with pharmacy regarding who has been refilling and when last filled.

## 2015-03-07 NOTE — Telephone Encounter (Signed)
-----   Message from Blain Pais sent at 03/07/2015  9:46 AM EDT ----- Regarding: tcm/ph 03/21/2015 Ignacia Bayley, NP 2:00

## 2015-03-07 NOTE — Telephone Encounter (Signed)
Ok! Np Thank You! If you need me to schedule let me know.

## 2015-03-08 ENCOUNTER — Other Ambulatory Visit: Payer: Self-pay | Admitting: Internal Medicine

## 2015-03-08 ENCOUNTER — Other Ambulatory Visit: Payer: Self-pay

## 2015-03-08 DIAGNOSIS — N183 Chronic kidney disease, stage 3 unspecified: Secondary | ICD-10-CM

## 2015-03-08 NOTE — Telephone Encounter (Signed)
Refilled with one refill and sent a message to pharmacy to have cardiologist refill from now on.

## 2015-03-08 NOTE — Progress Notes (Signed)
Order placed for met b 

## 2015-03-08 NOTE — Telephone Encounter (Signed)
Yes.  They just need to contact cardiology for refill.  They just contacted [place] by mistake.  Thanks

## 2015-03-08 NOTE — Telephone Encounter (Signed)
Dr. Brandy Hale probably not spelt right but pharmacy said that is who fills it .

## 2015-03-11 ENCOUNTER — Ambulatory Visit: Payer: Self-pay | Admitting: Internal Medicine

## 2015-03-12 ENCOUNTER — Ambulatory Visit: Payer: Self-pay

## 2015-03-13 ENCOUNTER — Ambulatory Visit (INDEPENDENT_AMBULATORY_CARE_PROVIDER_SITE_OTHER): Payer: Commercial Managed Care - HMO | Admitting: Internal Medicine

## 2015-03-13 ENCOUNTER — Encounter: Payer: Self-pay | Admitting: Internal Medicine

## 2015-03-13 VITALS — BP 112/80 | HR 77 | Temp 97.8°F | Resp 18 | Ht 65.0 in | Wt 162.5 lb

## 2015-03-13 DIAGNOSIS — N184 Chronic kidney disease, stage 4 (severe): Secondary | ICD-10-CM

## 2015-03-13 DIAGNOSIS — I2510 Atherosclerotic heart disease of native coronary artery without angina pectoris: Secondary | ICD-10-CM | POA: Diagnosis not present

## 2015-03-13 DIAGNOSIS — N289 Disorder of kidney and ureter, unspecified: Secondary | ICD-10-CM | POA: Diagnosis not present

## 2015-03-13 DIAGNOSIS — K219 Gastro-esophageal reflux disease without esophagitis: Secondary | ICD-10-CM

## 2015-03-13 DIAGNOSIS — N183 Chronic kidney disease, stage 3 unspecified: Secondary | ICD-10-CM

## 2015-03-13 DIAGNOSIS — Z720 Tobacco use: Secondary | ICD-10-CM

## 2015-03-13 DIAGNOSIS — E785 Hyperlipidemia, unspecified: Secondary | ICD-10-CM

## 2015-03-13 DIAGNOSIS — F17200 Nicotine dependence, unspecified, uncomplicated: Secondary | ICD-10-CM

## 2015-03-13 DIAGNOSIS — R197 Diarrhea, unspecified: Secondary | ICD-10-CM

## 2015-03-13 DIAGNOSIS — E875 Hyperkalemia: Secondary | ICD-10-CM

## 2015-03-13 DIAGNOSIS — I10 Essential (primary) hypertension: Secondary | ICD-10-CM

## 2015-03-13 LAB — URINALYSIS, ROUTINE W REFLEX MICROSCOPIC
Bilirubin Urine: NEGATIVE
Hgb urine dipstick: NEGATIVE
Ketones, ur: NEGATIVE
Leukocytes, UA: NEGATIVE
Nitrite: NEGATIVE
SPECIFIC GRAVITY, URINE: 1.02 (ref 1.000–1.030)
Total Protein, Urine: NEGATIVE
Urine Glucose: NEGATIVE
Urobilinogen, UA: 0.2 (ref 0.0–1.0)
pH: 5.5 (ref 5.0–8.0)

## 2015-03-13 LAB — BASIC METABOLIC PANEL
BUN: 25 mg/dL — AB (ref 6–23)
CHLORIDE: 105 meq/L (ref 96–112)
CO2: 25 meq/L (ref 19–32)
CREATININE: 1.59 mg/dL — AB (ref 0.40–1.20)
Calcium: 9.2 mg/dL (ref 8.4–10.5)
GFR: 34.23 mL/min — ABNORMAL LOW (ref >60.00)
Glucose, Bld: 81 mg/dL (ref 70–99)
Potassium: 4.8 mEq/L (ref 3.5–5.1)
Sodium: 139 mEq/L (ref 135–145)

## 2015-03-13 NOTE — Patient Instructions (Signed)
Restart losartan - one per day

## 2015-03-13 NOTE — Progress Notes (Signed)
Patient ID: Sandra Ellison, female   DOB: Dec 15, 1945, 69 y.o.   MRN: [id number]   Subjective:    Patient ID: Sandra Ellison, female    DOB: August 17, 1945, 69 y.o.   MRN: [id number]  HPI  Patient here for a hospital follow up.  Was admitted with worsening renal function and hyperkalemia.  See discharge summary for details.  Was discharged 03/07/15.  Was given some fluid and kayexalate in the hospital.  Potassium improved.  entresto was stopped.  She has felt better.  Yesterday, she started having watery diarrhea.  Some nausea with one episode of emesis.  She is already feeling better.  Diarrhea is improving.  No abdominal pain or cramping.  No blood in the stool.  No fever or chills.  Is due to f/u with Dr Rockey Situ on 03/21/15.    Past Medical History  Diagnosis Date  . CHF (congestive heart failure) ([place])   . GERD (gastroesophageal reflux disease)   . COPD (chronic obstructive pulmonary disease) ([place])   . Hx of colonic polyps   . Iron deficiency anemia   . Chronic kidney disease (CKD), stage II (mild)   . Pulmonary HTN ([place])   . Arthritis   . Allergy   . History of blood transfusion   . Diverticulosis     H/O  . History of abnormal Pap smear     s/p vaginal hysterectomy   Past Surgical History  Procedure Laterality Date  . Tonsillectomy  age 58  . Vaginal hysterectomy  1981    secondary to abnormal pap smear  . Orif distal radius fracture  2011    right  . Colonoscopy  10/2010   Family History  Problem Relation Age of Onset  . Arthritis Mother   . Alcohol abuse Father   . Arthritis Father   . Cancer Father     throat/spine  . Breast cancer Neg Hx    Social History   Social History  . Marital Status: Single    Spouse Name: N/A  . Number of Children: 1  . Years of Education: N/A   Social History Main Topics  . Smoking status: Current Every Day Smoker -- 0.25 packs/day for 42 years    Types: Cigarettes  . Smokeless tobacco: Never Used     Comment: Quit early December  2015  . Alcohol Use: No  . Drug Use: No  . Sexual Activity: Not Asked   Other Topics Concern  . None   Social History Narrative    Outpatient Encounter Prescriptions as of 03/13/2015  Medication Sig  . aspirin EC 81 MG tablet Take 81 mg by mouth daily.  . carvedilol (COREG) 3.125 MG tablet TAKE 1 TABLET (3.125 MG TOTAL) BY MOUTH 2 (TWO) TIMES DAILY WITH A MEAL.  . ferrous fumarate (HEMOCYTE - 106 MG FE) 325 (106 FE) MG TABS tablet Take 2 tablets by mouth daily.   Marland Kitchen gabapentin (NEURONTIN) 100 MG capsule Take 100 mg by mouth 3 (three) times daily as needed (for pain). Pt takes with a 300mg  capsule.  . gabapentin (NEURONTIN) 300 MG capsule Take 300 mg by mouth 3 (three) times daily as needed (for pain). Pt takes with a 100mg  capsule.  . pantoprazole (PROTONIX) 40 MG tablet Take 1 tablet (40 mg total) by mouth daily.  . polyethylene glycol (MIRALAX / GLYCOLAX) packet Take 17 g by mouth daily as needed for mild constipation.  . simvastatin (ZOCOR) 10 MG tablet Take 1 tablet (10 mg total) by  mouth at bedtime.  Marland Kitchen tiotropium (SPIRIVA) 18 MCG inhalation capsule Place 18 mcg into inhaler and inhale daily.  . [DISCONTINUED] carvedilol (COREG) 3.125 MG tablet Take 3.125 mg by mouth 2 (two) times daily.   No facility-administered encounter medications on file as of 03/13/2015.    Review of Systems  Constitutional:       She has changed to a bland appetite with the start of diarrhea.  No further vomiting.  Energy improving.   HENT: Negative for congestion and sinus pressure.   Eyes: Negative for pain and visual disturbance.  Respiratory: Negative for cough, chest tightness and shortness of breath.   Cardiovascular: Negative for chest pain, palpitations and leg swelling.  Gastrointestinal: Positive for nausea, vomiting (one episode of vomiting.  ) and diarrhea (resolving. ). Negative for abdominal pain.  Genitourinary: Negative for dysuria and difficulty urinating.  Musculoskeletal: Negative for  back pain and joint swelling.  Skin: Negative for color change and rash.  Neurological: Negative for dizziness, light-headedness and headaches.  Psychiatric/Behavioral: Negative for dysphoric mood and agitation.       Objective:     Blood pressure rechecked by me:  156/86  Physical Exam  Constitutional: She appears well-developed and well-nourished. No distress.  HENT:  Nose: Nose normal.  Mouth/Throat: Oropharynx is clear and moist.  Eyes: Conjunctivae are normal. Right eye exhibits no discharge. Left eye exhibits no discharge.  Neck: Neck supple.  Cardiovascular: Normal rate and regular rhythm.   Pulmonary/Chest: Breath sounds normal. No respiratory distress. She has no wheezes.  Abdominal: Soft. Bowel sounds are normal. There is no tenderness.  Musculoskeletal: She exhibits no edema or tenderness.  Lymphadenopathy:    She has no cervical adenopathy.  Skin: No rash noted. No erythema.  Psychiatric: She has a normal mood and affect. Her behavior is normal.    BP 112/80 mmHg  Pulse 77  Temp(Src) 97.8 F (36.6 C) (Oral)  Resp 18  Ht 5\' 5"  (1.651 m)  Wt 162 lb 8 oz (73.71 kg)  BMI 27.04 kg/m2  SpO2 97% Wt Readings from Last 3 Encounters:  03/13/15 162 lb 8 oz (73.71 kg)  03/06/15 155 lb (70.308 kg)  02/21/15 167 lb (75.751 kg)     Lab Results  Component Value Date   WBC 5.6 03/07/2015   HGB 12.2 03/07/2015   HCT 35.6 03/07/2015   PLT 228 03/07/2015   GLUCOSE 81 03/13/2015   CHOL 143 03/06/2015   TRIG 92.0 03/06/2015   HDL 37.10* 03/06/2015   LDLCALC 88 03/06/2015   ALT 11 03/06/2015   AST 12 03/06/2015   NA 139 03/13/2015   K 4.8 03/13/2015   CL 105 03/13/2015   CREATININE 1.59* 03/13/2015   BUN 25* 03/13/2015   CO2 25 03/13/2015   TSH 3.11 08/30/2014   INR 1.0 02/05/2012       Assessment & Plan:   Problem List Items Addressed This Visit    CAD (coronary artery disease)    Followed by Dr Rockey Situ.  Stable.       CKD (chronic kidney disease),  stage IV (HCC)    Worsening renal function recently.  Admitted.  Given gentle hydration.  Recheck metabolic panel today.        Diarrhea    Improved.  Stay hydrated.  Bland foods.  Advance diet as tolerated.  Follow.  Feeling better.        Essential hypertension    Blood pressure as outlined.  Restart losartan.  Follow pressures.  Keep appt with Dr Rockey Situ.  Follow up soon here to reassess blood pressure.       GERD (gastroesophageal reflux disease)    EGD 01/09/11 - as outlined.  Seeing Dr Bary Castilla.  Upper symptoms controlled.       Hyperkalemia    Increased potassium as outlined.  entresto stopped.  Given kayexalate in the hospital.  Recheck potassium today.        Hyperlipidemia    Low cholesterol diet and exercise.  Follow lipid panel and liver function tests.  On simvastatin.        Smoking    Discussed the need for smoking cessation.  Follow.         Other Visit Diagnoses    Renal insufficiency    -  Primary    Relevant Orders    Urinalysis, Routine w reflex microscopic (not at [facility]) (Completed)    CKD (chronic kidney disease) stage 3, GFR 30-59 ml/min            Einar Pheasant, MD

## 2015-03-13 NOTE — Progress Notes (Signed)
Pre-visit discussion using our clinic review tool. No additional management support is needed unless otherwise documented below in the visit note.  

## 2015-03-14 ENCOUNTER — Encounter: Payer: Self-pay | Admitting: *Deleted

## 2015-03-14 ENCOUNTER — Other Ambulatory Visit: Payer: Self-pay | Admitting: Internal Medicine

## 2015-03-14 DIAGNOSIS — N183 Chronic kidney disease, stage 3 unspecified: Secondary | ICD-10-CM

## 2015-03-14 NOTE — Progress Notes (Signed)
Order placed for f/u met b.  

## 2015-03-17 ENCOUNTER — Encounter: Payer: Self-pay | Admitting: Internal Medicine

## 2015-03-17 DIAGNOSIS — I10 Essential (primary) hypertension: Secondary | ICD-10-CM | POA: Insufficient documentation

## 2015-03-17 DIAGNOSIS — R197 Diarrhea, unspecified: Secondary | ICD-10-CM | POA: Insufficient documentation

## 2015-03-17 NOTE — Assessment & Plan Note (Signed)
Discussed the need for smoking cessation.  Follow.

## 2015-03-17 NOTE — Assessment & Plan Note (Signed)
Low cholesterol diet and exercise.  Follow lipid panel and liver function tests.  On simvastatin.   

## 2015-03-17 NOTE — Assessment & Plan Note (Signed)
Worsening renal function recently.  Admitted.  Given gentle hydration.  Recheck metabolic panel today.

## 2015-03-17 NOTE — Assessment & Plan Note (Addendum)
Blood pressure as outlined.  Restart losartan.  Follow pressures.  Keep appt with Dr Rockey Situ.  Follow up soon here to reassess blood pressure.

## 2015-03-17 NOTE — Assessment & Plan Note (Signed)
EGD 01/09/11 - as outlined.  Seeing Dr Bary Castilla.  Upper symptoms controlled.

## 2015-03-17 NOTE — Assessment & Plan Note (Signed)
Improved.  Stay hydrated.  Bland foods.  Advance diet as tolerated.  Follow.  Feeling better.

## 2015-03-17 NOTE — Assessment & Plan Note (Signed)
Followed by Dr Gollan.  Stable.   

## 2015-03-17 NOTE — Assessment & Plan Note (Signed)
Increased potassium as outlined.  entresto stopped.  Given kayexalate in the hospital.  Recheck potassium today.

## 2015-03-21 ENCOUNTER — Ambulatory Visit (INDEPENDENT_AMBULATORY_CARE_PROVIDER_SITE_OTHER): Payer: Commercial Managed Care - HMO | Admitting: Nurse Practitioner

## 2015-03-21 ENCOUNTER — Telehealth: Payer: Self-pay | Admitting: *Deleted

## 2015-03-21 ENCOUNTER — Encounter: Payer: Self-pay | Admitting: Nurse Practitioner

## 2015-03-21 VITALS — BP 150/82 | HR 80 | Ht 65.0 in | Wt 163.5 lb

## 2015-03-21 DIAGNOSIS — E875 Hyperkalemia: Secondary | ICD-10-CM | POA: Diagnosis not present

## 2015-03-21 DIAGNOSIS — I420 Dilated cardiomyopathy: Secondary | ICD-10-CM

## 2015-03-21 DIAGNOSIS — N184 Chronic kidney disease, stage 4 (severe): Secondary | ICD-10-CM | POA: Diagnosis not present

## 2015-03-21 DIAGNOSIS — I739 Peripheral vascular disease, unspecified: Secondary | ICD-10-CM

## 2015-03-21 DIAGNOSIS — Z720 Tobacco use: Secondary | ICD-10-CM

## 2015-03-21 DIAGNOSIS — E785 Hyperlipidemia, unspecified: Secondary | ICD-10-CM

## 2015-03-21 DIAGNOSIS — I5042 Chronic combined systolic (congestive) and diastolic (congestive) heart failure: Secondary | ICD-10-CM

## 2015-03-21 DIAGNOSIS — I10 Essential (primary) hypertension: Secondary | ICD-10-CM

## 2015-03-21 DIAGNOSIS — I779 Disorder of arteries and arterioles, unspecified: Secondary | ICD-10-CM | POA: Insufficient documentation

## 2015-03-21 MED ORDER — ATORVASTATIN CALCIUM 40 MG PO TABS: 40.0000 mg | ORAL_TABLET | Freq: Every day | ORAL | Status: DC

## 2015-03-21 NOTE — Telephone Encounter (Signed)
Patient wants to know if she is to continue her Losartin? Please call thanks!

## 2015-03-21 NOTE — Progress Notes (Signed)
Patient Name: Sandra Ellison Date of Encounter: 03/21/2015  Primary Care Provider:  Einar Pheasant, MD Primary Cardiologist:  Johnny Bridge, MD   Chief Complaint  69 y/o female w/ a h/o CHF and dilated CM, who presents for f/u after recent hospitalization for AKI and hyperkalemia.  Past Medical History   Past Medical History  Diagnosis Date  . Chronic combined systolic and diastolic CHF (congestive heart failure) ([place])     a. 01/2012 Echo: EF 30-35%, mod conc LVH, sev inf HK, mildly dil LA, mild-mod MR, Trace TR, RVSP 40-29mmHg.    . Dilated cardiomyopathy ([place])     a. 02/2011 MV: EF 27%, anteroseptal, inf, inferolateral scar, no ischemia.  Marland Kitchen COPD (chronic obstructive pulmonary disease) ([place])   . Hx of colonic polyps   . Iron deficiency anemia     a. h/o transfusions, prev followed by heme/onc.  . Chronic kidney disease (CKD), stage II (mild)   . Pulmonary HTN ([place])   . Arthritis   . Allergy   . History of diverticulosis   . History of abnormal Pap smear     a. s/p vaginal hysterectomy  . GERD (gastroesophageal reflux disease)   . Hyperkalemia     a. 02/2015 in setting of entresto/AKI->entresto d/c'd.  . Carotid arterial disease ([place])     a. 02/2015 Carotid U/S: RICA 32-20%, LICA 25-42%.  . Tobacco abuse    Past Surgical History  Procedure Laterality Date  . Tonsillectomy  age 62  . Vaginal hysterectomy  1981    secondary to abnormal pap smear  . Orif distal radius fracture  2011    right  . Colonoscopy  10/2010    Allergies  Allergies  Allergen Reactions  . Ranitidine Hcl Anaphylaxis and Swelling  . Entresto [Sacubitril-Valsartan] Other (See Comments)    Potassium issues    HPI  70 y/o female with the above complex problem list. She has a h/o dilated CM and combined syst/diast CHF (EF 30-35% - 01/2012).  She was last seen in clinic on 8/4, @ which time she was started on entresto.  She was recently admitted to [facility] with hyperkalemia and acute on chronic renal  failure.  Delene Loll was d/c'd and lasix held.  She was treated with kayexalate, D50, and insulin and subsequently d/c'd on 9/29.  She has since had f/u labs revealing stable renal fxn and K on 10/5. Her prior dose of losartan was resumed that day and she has been taking 25 mg daily since. Since then, she has been feeling well. She does have chronic dyspnea on exertion which has not changed in approximately 4 years. She denies any history of chest pain, palpitations, PND, orthopnea, dizziness, syncope, edema, or early satiety. Although she was producing taking Lasix 20 mg when necessary, she says she was almost never taking it and hasn't felt as though she needed it recently.  Home Medications  Prior to Admission medications   Medication Sig Start Date End Date Taking? Authorizing Provider  aspirin EC 81 MG tablet Take 81 mg by mouth daily.   Yes Historical Provider, MD  carvedilol (COREG) 3.125 MG tablet TAKE 1 TABLET (3.125 MG TOTAL) BY MOUTH 2 (TWO) TIMES DAILY WITH A MEAL. 03/08/15  Yes Einar Pheasant, MD  ferrous fumarate (HEMOCYTE - 106 MG FE) 325 (106 FE) MG TABS tablet Take 2 tablets by mouth daily.    Yes Historical Provider, MD  gabapentin (NEURONTIN) 100 MG capsule Take 100 mg by mouth 3 (three) times  daily as needed (for pain). Pt takes with a 300mg  capsule.   Yes Historical Provider, MD  gabapentin (NEURONTIN) 300 MG capsule Take 300 mg by mouth 3 (three) times daily as needed (for pain). Pt takes with a 100mg  capsule.   Yes Historical Provider, MD  pantoprazole (PROTONIX) 40 MG tablet Take 1 tablet (40 mg total) by mouth daily. 02/21/15  Yes Einar Pheasant, MD  polyethylene glycol (MIRALAX / GLYCOLAX) packet Take 17 g by mouth daily as needed for mild constipation.   Yes Historical Provider, MD  simvastatin (ZOCOR) 10 MG tablet Take 1 tablet (10 mg total) by mouth at bedtime. 02/21/15  Yes Einar Pheasant, MD  tiotropium (SPIRIVA) 18 MCG inhalation capsule Place 18 mcg into inhaler and inhale  daily.   Yes Historical Provider, MD    Review of Systems  As above, she has chronic dyspnea on exertion.  She denies chest pain, palpitations, pnd, orthopnea, n, v, dizziness, syncope, edema, weight gain, or early satiety.  All other systems reviewed and are otherwise negative except as noted above.  Physical Exam  VS:  BP 150/82 mmHg  Pulse 80  Ht 5\' 5"  (1.651 m)  Wt 163 lb 8 oz (74.163 kg)  BMI 27.21 kg/m2 , BMI Body mass index is 27.21 kg/(m^2). GEN: Well nourished, well developed, in no acute distress. HEENT: normal. Neck: Supple, no JVD, loud right carotid bruit. Cardiac: RRR, no murmurs, rubs, or gallops. No clubbing, cyanosis, edema.  Radials/DP/PT 1+ and equal bilaterally.  Respiratory:  Respirations regular and unlabored, clear to auscultation bilaterally. GI: Soft, nontender, nondistended, BS + x 4. MS: no deformity or atrophy. Skin: warm and dry, no rash. Neuro:  Strength and sensation are intact. Psych: Normal affect.  Accessory Clinical Findings  ECG - regular sinus rhythm, 79, left axis, anterior and lateral ST depression and T-wave inversion-no acute ST or T changes.  Lab Results  Component Value Date   CREATININE 1.59* 03/13/2015   BUN 25* 03/13/2015   NA 139 03/13/2015   K 4.8 03/13/2015   CL 105 03/13/2015   CO2 25 03/13/2015    Assessment & Plan  1.  Chronic combined systolic and diastolic congestive heart failure/dilated/? Ischemic vs nonischemic cardiomyopathy: Patient has a history of cardiomyopathy and heart failure with an EF of 30-35% by most recent evaluation in 2013. Her chart indicates that she this is a nonischemic dilated cardiomyopathy without evidence of ischemia on Myoview in September 2012, however it is notable that she did have several areas of prior infarct. She says she has never had a diagnostic catheterization, though coronary artery disease is carried throughout her problem lists both in the outpatient setting and hospital, presumably  secondary to coronary artery calcifications noted on CT in November 2014. Regardless, she has not been having any chest discomfort and her volume has been stable. She was recently admitted for hyperkalemia in the setting of acute on chronic renal failure and Entresto therapy.  Follow-up labs on October 5 revealed improvement though not complete normalization of creatinine with potassium of 4.8. At that time, she was advised to resume losartan, which she had previously tolerated for several years. I will follow-up a basic metabolic panel today. If there is any evidence of worsening renal failure or hyperkalemia, we will need to stop losartan. Further, if renal function worsens, we will also need to pursue a renal duplex to rule out renal artery stenosis. Finally, if losartan must be discontinued, we will add BiDil therapy. Continue carvedilol and  when necessary Lasix.  Once on max medical therapy, we should plan to f/u echo to determine candidacy for ICD therapy.  2. Essential hypertension: Blood pressure is mildly elevated today. She says that this is unusual for her. She was recently started back on ARB therapy. I've asked her to follow her blood pressure at home. Follow-up basic metabolic panel today. If stable, we could consider titrating losartan further versus adding an additional agent.  3. Hyperlipidemia: LDL was 88 in September. In the setting of known cerebrovascular/carotid arterial disease, we should be more aggressive with her LDL lowering. I will switch her from simvastatin to Lipitor 40 mg. She will need follow-up lipids and LFTs in approximately 6 weeks.  4. Carotid arterial disease: She has a 60-79% right internal carotid artery stenosis with a 40-59% left internal carotid artery stenosis. She is asymptomatic. She remains on aspirin and as above, I'm switching her to Lipitor. Follow-up ultrasound in approximately 6 months.  5. Ongoing tobacco abuse/COPD: Patient is unwilling to discuss  smoking cessation. She says that she has tried Chantix without success. She does not believe that her ongoing tobacco abuse contribute in any way to her vascular disease.  6. Hyperkalemia: Follow-up basic metabolic panel today.  7. Stage 3-4 chronic kidney disease: Follow-up basic metabolic panel today. She is back on ARB therapy.  8. Disposition: Follow-up basic metabolic panel today. She will need follow-up lipids and LFTs in approximately 6 weeks. Follow-up carotid ultrasound in 6 months. Follow-up with Dr. Rockey Situ in ~ 4 mos or sooner if necessary.  Murray Hodgkins, NP 03/21/2015, 2:40 PM

## 2015-03-21 NOTE — Patient Instructions (Signed)
Medication Instructions:  Your physician has recommended you make the following change in your medication:  1) STOP Simvastatin  2) START Atorvastatin (Lipitor) 40mg  daily. An Rx has bee sent to your pharmacy  Labwork: Bmet today  Testing/Procedures: None ordered  Follow-Up: Your physician wants you to follow-up in: 4 months with Dr.Gollan You will receive a reminder letter in the mail two months in advance. If you don't receive a letter, please call our office to schedule the follow-up appointment.    Any Other Special Instructions Will Be Listed Below (If Applicable).

## 2015-03-21 NOTE — Telephone Encounter (Signed)
Pt called to inquire if she should continue taking losartan. She had OV w/Chris Sharolyn Douglas today and was unsure. Reviewed AVS w/pt Per Gerald Stabs, continue losartan until labs have resulted. At that time, decision will be made if there is a need to adjust medications. Pt verbalized understanding.

## 2015-03-22 ENCOUNTER — Other Ambulatory Visit: Payer: Self-pay

## 2015-03-22 ENCOUNTER — Telehealth: Payer: Self-pay | Admitting: Cardiovascular Disease

## 2015-03-22 ENCOUNTER — Other Ambulatory Visit
Admission: RE | Admit: 2015-03-22 | Discharge: 2015-03-22 | Disposition: A | Discharge status: Home / Self Care | Payer: Commercial Managed Care - HMO | Source: Ambulatory Visit | Attending: Nurse Practitioner | Admitting: Nurse Practitioner

## 2015-03-22 ENCOUNTER — Telehealth: Payer: Self-pay | Admitting: *Deleted

## 2015-03-22 ENCOUNTER — Telehealth: Payer: Self-pay | Admitting: Internal Medicine

## 2015-03-22 DIAGNOSIS — E875 Hyperkalemia: Secondary | ICD-10-CM | POA: Insufficient documentation

## 2015-03-22 LAB — BASIC METABOLIC PANEL
ANION GAP: 9 (ref 5–15)
BUN / CREAT RATIO: 17 (ref 11–26)
BUN: 25 mg/dL (ref 8–27)
BUN: 24 mg/dL — AB (ref 6–20)
CALCIUM: 8.9 mg/dL (ref 8.7–10.3)
CO2: 18 mmol/L (ref 18–29)
CO2: 22 mmol/L (ref 22–32)
CREATININE: 1.47 mg/dL — AB (ref 0.57–1.00)
Calcium: 9 mg/dL (ref 8.9–10.3)
Chloride: 106 mmol/L (ref 97–108)
Chloride: 110 mmol/L (ref 101–111)
Creatinine, Ser: 1.65 mg/dL — ABNORMAL HIGH (ref 0.44–1.00)
GFR calc Af Amer: 36 mL/min — ABNORMAL LOW (ref >60)
GFR, EST AFRICAN AMERICAN: 42 mL/min/{1.73_m2} — AB (ref >59)
GFR, EST NON AFRICAN AMERICAN: 36 mL/min/{1.73_m2} — AB (ref >59)
GFR, EST NON AFRICAN AMERICAN: 31 mL/min — AB (ref >60)
GLUCOSE: 91 mg/dL (ref 65–99)
Glucose: 102 mg/dL — ABNORMAL HIGH (ref 65–99)
POTASSIUM: 5 mmol/L (ref 3.5–5.1)
Potassium: 6 mmol/L — ABNORMAL HIGH (ref 3.5–5.2)
Sodium: 141 mmol/L (ref 134–144)
Sodium: 141 mmol/L (ref 135–145)

## 2015-03-22 MED ORDER — ISOSORB DINITRATE-HYDRALAZINE 20-37.5 MG PO TABS: 1.0000 | ORAL_TABLET | Freq: Three times a day (TID) | ORAL | Status: DC

## 2015-03-22 NOTE — Telephone Encounter (Signed)
Pt is returning our call. Please call

## 2015-03-22 NOTE — Telephone Encounter (Signed)
Please advise if you need any thing 

## 2015-03-22 NOTE — Telephone Encounter (Signed)
[facility]

## 2015-03-22 NOTE — Telephone Encounter (Signed)
Pt went to the cardiologist yesterday and got lab work done there and the potasium was elevated. Pt went and got it check again today. Pt states she may have to back to the ER and drink the liquid to get rid of the potasium. Thank You!

## 2015-03-22 NOTE — Telephone Encounter (Signed)
Wanted to let you know that the losartan was causing her potassium was up, Potassium recheck was normal, no issues today.

## 2015-03-22 NOTE — Telephone Encounter (Signed)
Notify pt that I reviewed cardiology's notes.  They have changed her medication and have her scheduled for a f/u lab.  Tell her to keep [place] posted and let us know if needs anything.

## 2015-03-22 NOTE — Telephone Encounter (Signed)
S/w pt. See lab note

## 2015-03-25 ENCOUNTER — Telehealth: Payer: Self-pay | Admitting: *Deleted

## 2015-03-25 ENCOUNTER — Other Ambulatory Visit: Payer: Self-pay

## 2015-03-25 NOTE — Telephone Encounter (Signed)
Pt c/o medication issue:  1. Name of Medication: Bidil    2. How are you currently taking this medication (dosage and times per day)? Taking it 3 times a day   3. Are you having a reaction (difficulty breathing--STAT)? No  4. What is your medication issue? One of the natural side effect is headache. On Saturday she had a really bad Saturday. She does not like how it makes her feel The only thing she is able to take for this, was Tylenol. She would like to talk about this and figure out what can she do about it.  Please advise.

## 2015-03-25 NOTE — Telephone Encounter (Signed)
Spoke w/ pt.  She reports that she started Bidil on Fri pm, took TID on Sat and developed HA. She called the pharmacist and was advised to take Tylenol, that this is a normal side effect and will lessen w/ time.  She states that yesterday was not as bad, she took 1/2 pill this am w/ Tyelonol, waited an hour and then took the other 1/2 w/ no HA so far.  Advised her that I will make Dr. Rockey Situ aware, but she states that she would like to continue to monitor and see if her HAs improve. Advised her to check her BP, esp while she has a HA, but she states that she does not have a monitor.  Advised her to obtain one, but she states that she will have [place] check it on Thurs when she comes in for lab appt.

## 2015-03-28 ENCOUNTER — Other Ambulatory Visit: Payer: Commercial Managed Care - HMO

## 2015-03-28 ENCOUNTER — Other Ambulatory Visit: Payer: Self-pay

## 2015-03-30 ENCOUNTER — Other Ambulatory Visit: Payer: Self-pay | Admitting: Internal Medicine

## 2015-04-03 ENCOUNTER — Telehealth: Payer: Self-pay

## 2015-04-03 ENCOUNTER — Other Ambulatory Visit (INDEPENDENT_AMBULATORY_CARE_PROVIDER_SITE_OTHER): Payer: Commercial Managed Care - HMO

## 2015-04-03 DIAGNOSIS — E875 Hyperkalemia: Secondary | ICD-10-CM

## 2015-04-03 NOTE — Telephone Encounter (Signed)
Sometimes we need to start slow on the BiDil, Would she be willing to try one half pill 3 times per day? Would try this for a few weeks if tolerated before increasing the dose This medication is a very good alternative to the other medications that cause her potassium problem

## 2015-04-03 NOTE — Telephone Encounter (Signed)
Patient stopped the Bidil last Tuesday, Oct. 18, 2016 due to nausea, pounding headache and loss of appetite. Please advise what the patient should take now.  Patient will have BMET next week with her PCP.

## 2015-04-03 NOTE — Telephone Encounter (Signed)
Spoke w/ pt.  Advised her of Dr. Donivan Scull recommendation.  She reports that she did try to take a 1/2 pill TID for 2 days w/ no relief.   She does not want to try again b/c it made her feel so bad.  She would like to know if there is anything else she can take that won't cause HAs, as she has a h/o migraines. Please advise.  Thank you.

## 2015-04-05 ENCOUNTER — Other Ambulatory Visit: Payer: Self-pay | Admitting: Internal Medicine

## 2015-04-05 MED ORDER — HYDRALAZINE HCL 25 MG PO TABS: 25.0000 mg | ORAL_TABLET | Freq: Three times a day (TID) | ORAL | Status: DC

## 2015-04-05 NOTE — Telephone Encounter (Signed)
Left message on pt's vm w/ Dr. Gollan's recommendation. Asked her to call back w/ any questions or concerns.  

## 2015-04-05 NOTE — Telephone Encounter (Signed)
Okay to hold BiDil given side effects  Would start hydralazine 25 mg 3 times a day  Monitor blood pressure

## 2015-04-08 ENCOUNTER — Telehealth: Payer: Self-pay | Admitting: *Deleted

## 2015-04-08 NOTE — Telephone Encounter (Signed)
Patient called the office to report that she has had some issues with heart medications and needs to cancel colonoscopy at this time. She is currently under the care of Dr. Rockey Situ.   This patient was instructed that once she has been cleared to proceed with colonoscopy to notify our office as well as letting Dr. Rockey Situ know to provide a note in writing stating it is okay to proceed. She verbalizes understanding.   Almyra Free in [facility] has been notified of cancellation.

## 2015-04-17 ENCOUNTER — Encounter: Admission: RE | Payer: Self-pay | Source: Ambulatory Visit

## 2015-04-17 ENCOUNTER — Ambulatory Visit
Admission: RE | Admit: 2015-04-17 | Payer: Commercial Managed Care - HMO | Source: Ambulatory Visit | Admitting: General Surgery

## 2015-04-17 SURGERY — COLONOSCOPY
Anesthesia: General

## 2015-04-18 ENCOUNTER — Ambulatory Visit (INDEPENDENT_AMBULATORY_CARE_PROVIDER_SITE_OTHER): Payer: Commercial Managed Care - HMO | Admitting: Internal Medicine

## 2015-04-18 ENCOUNTER — Encounter: Payer: Self-pay | Admitting: Internal Medicine

## 2015-04-18 VITALS — BP 110/90 | HR 84 | Temp 97.7°F | Resp 18 | Ht 65.0 in | Wt 164.0 lb

## 2015-04-18 DIAGNOSIS — I779 Disorder of arteries and arterioles, unspecified: Secondary | ICD-10-CM

## 2015-04-18 DIAGNOSIS — K219 Gastro-esophageal reflux disease without esophagitis: Secondary | ICD-10-CM

## 2015-04-18 DIAGNOSIS — J449 Chronic obstructive pulmonary disease, unspecified: Secondary | ICD-10-CM

## 2015-04-18 DIAGNOSIS — I429 Cardiomyopathy, unspecified: Secondary | ICD-10-CM | POA: Diagnosis not present

## 2015-04-18 DIAGNOSIS — I428 Other cardiomyopathies: Secondary | ICD-10-CM

## 2015-04-18 DIAGNOSIS — I420 Dilated cardiomyopathy: Secondary | ICD-10-CM

## 2015-04-18 DIAGNOSIS — I10 Essential (primary) hypertension: Secondary | ICD-10-CM

## 2015-04-18 DIAGNOSIS — Z720 Tobacco use: Secondary | ICD-10-CM

## 2015-04-18 DIAGNOSIS — I5022 Chronic systolic (congestive) heart failure: Secondary | ICD-10-CM

## 2015-04-18 DIAGNOSIS — N184 Chronic kidney disease, stage 4 (severe): Secondary | ICD-10-CM

## 2015-04-18 DIAGNOSIS — I739 Peripheral vascular disease, unspecified: Secondary | ICD-10-CM

## 2015-04-18 DIAGNOSIS — E875 Hyperkalemia: Secondary | ICD-10-CM

## 2015-04-18 LAB — HEPATIC FUNCTION PANEL
ALBUMIN: 4 g/dL (ref 3.5–5.2)
ALT: 10 U/L (ref 0–35)
AST: 10 U/L (ref 0–37)
Alkaline Phosphatase: 132 U/L — ABNORMAL HIGH (ref 39–117)
BILIRUBIN TOTAL: 1 mg/dL (ref 0.2–1.2)
Bilirubin, Direct: 0.3 mg/dL (ref 0.0–0.3)
TOTAL PROTEIN: 6.4 g/dL (ref 6.0–8.3)

## 2015-04-18 LAB — BASIC METABOLIC PANEL
BUN: 16 mg/dL (ref 6–23)
CO2: 25 mEq/L (ref 19–32)
Calcium: 9.2 mg/dL (ref 8.4–10.5)
Chloride: 103 mEq/L (ref 96–112)
Creatinine, Ser: 1.36 mg/dL — ABNORMAL HIGH (ref 0.40–1.20)
GFR: 40.98 mL/min — AB (ref >60.00)
Glucose, Bld: 101 mg/dL — ABNORMAL HIGH (ref 70–99)
POTASSIUM: 4.6 meq/L (ref 3.5–5.1)
SODIUM: 138 meq/L (ref 135–145)

## 2015-04-18 NOTE — Progress Notes (Signed)
Patient ID: Sandra Ellison, female   DOB: Oct 04, 1945, 69 y.o.   MRN: [id number]   Subjective:    Patient ID: Sandra Ellison, female    DOB: 1945/11/26, 69 y.o.   MRN: [id number]  HPI  Patient with past history of cardiomyopathy (EF 30-35%), CHF, COPD, GERD, hypercholesterolemia and tobacco abuse.  She comes in today for a scheduled follow up.  She has had issues recently with worsening renal function and hyperkalemia.  Medications have been adjusted.  See cardiology's notes for details.  Did not tolerate BiDil.  Losartan was stopped secondary to elevated potassium.  She was supposed to start on hydralazine.  Has not started.  Reports no chest pain or tightness.  Breathing overall stable.  She is still smoking.  Discussed the need to quit.  She declines.  Eating.  No abdominal pain or cramping.  Bowels stable.     Past Medical History  Diagnosis Date  . Chronic combined systolic and diastolic CHF (congestive heart failure) ([place])     a. 01/2012 Echo: EF 30-35%, mod conc LVH, sev inf HK, mildly dil LA, mild-mod MR, Trace TR, RVSP 40-65mmHg.    . Dilated cardiomyopathy ([place])     a. 02/2011 MV: EF 27%, anteroseptal, inf, inferolateral scar, no ischemia.  Marland Kitchen COPD (chronic obstructive pulmonary disease) ([place])   . Hx of colonic polyps   . Iron deficiency anemia     a. h/o transfusions, prev followed by heme/onc.  . Chronic kidney disease (CKD), stage II (mild)   . Pulmonary HTN ([place])   . Arthritis   . Allergy   . History of diverticulosis   . History of abnormal Pap smear     a. s/p vaginal hysterectomy  . GERD (gastroesophageal reflux disease)   . Hyperkalemia     a. 02/2015 in setting of entresto/AKI->entresto d/c'd.  . Carotid arterial disease ([place])     a. 02/2015 Carotid U/S: RICA A999333, LICA 123456.  . Tobacco abuse    Past Surgical History  Procedure Laterality Date  . Tonsillectomy  age 58  . Vaginal hysterectomy  1981    secondary to abnormal pap smear  . Orif distal radius  fracture  2011    right  . Colonoscopy  10/2010   Family History  Problem Relation Age of Onset  . Arthritis Mother   . Alcohol abuse Father   . Arthritis Father   . Cancer Father     throat/spine  . Breast cancer Neg Hx    Social History   Social History  . Marital Status: Single    Spouse Name: N/A  . Number of Children: 1  . Years of Education: N/A   Social History Main Topics  . Smoking status: Current Every Day Smoker -- 0.25 packs/day for 42 years    Types: Cigarettes  . Smokeless tobacco: Never Used     Comment: Quit early December 2015  . Alcohol Use: No  . Drug Use: No  . Sexual Activity: Not Asked   Other Topics Concern  . None   Social History Narrative    Outpatient Encounter Prescriptions as of 04/18/2015  Medication Sig  . aspirin EC 81 MG tablet Take 81 mg by mouth daily.  Marland Kitchen atorvastatin (LIPITOR) 40 MG tablet Take 1 tablet (40 mg total) by mouth daily.  . carvedilol (COREG) 3.125 MG tablet TAKE 1 TABLET (3.125 MG TOTAL) BY MOUTH 2 (TWO) TIMES DAILY WITH A MEAL.  . ferrous fumarate (HEMOCYTE - 106 MG  FE) 325 (106 FE) MG TABS tablet Take 2 tablets by mouth daily.   Marland Kitchen gabapentin (NEURONTIN) 100 MG capsule TAKE 1 CAPSULE (100 MG TOTAL) BY MOUTH 3 (THREE) TIMES DAILY AS NEEDED.  Marland Kitchen gabapentin (NEURONTIN) 300 MG capsule TAKE ONE CAPSULE BY MOUTH 3 TIMES A DAY AS NEEDED  . pantoprazole (PROTONIX) 40 MG tablet Take 1 tablet (40 mg total) by mouth daily.  . polyethylene glycol (MIRALAX / GLYCOLAX) packet Take 17 g by mouth daily as needed for mild constipation.  Marland Kitchen tiotropium (SPIRIVA) 18 MCG inhalation capsule Place 18 mcg into inhaler and inhale daily.  . [DISCONTINUED] gabapentin (NEURONTIN) 100 MG capsule Take 100 mg by mouth 3 (three) times daily as needed (for pain). Pt takes with a 300mg  capsule.  . [DISCONTINUED] gabapentin (NEURONTIN) 300 MG capsule Take 300 mg by mouth 3 (three) times daily as needed (for pain). Pt takes with a 100mg  capsule.  .  [DISCONTINUED] hydrALAZINE (APRESOLINE) 25 MG tablet Take 1 tablet (25 mg total) by mouth 3 (three) times daily.   No facility-administered encounter medications on file as of 04/18/2015.    Review of Systems  Constitutional: Negative for appetite change and unexpected weight change.  HENT: Negative for congestion and sinus pressure.   Eyes: Negative for pain and discharge.  Respiratory: Negative for cough, chest tightness and shortness of breath.   Cardiovascular: Negative for chest pain, palpitations and leg swelling.  Gastrointestinal: Negative for nausea, vomiting, abdominal pain and diarrhea.  Genitourinary: Negative for dysuria and difficulty urinating.  Musculoskeletal: Negative for back pain and joint swelling.  Skin: Negative for color change and rash.  Neurological: Negative for dizziness, light-headedness and headaches.       Headache resolved after stopping BiDil.    Psychiatric/Behavioral: Negative for dysphoric mood and agitation.       Objective:     Blood pressure rechecked by me:  154/92  Physical Exam  Constitutional: She appears well-developed and well-nourished. No distress.  HENT:  Nose: Nose normal.  Mouth/Throat: Oropharynx is clear and moist.  Eyes: Conjunctivae are normal. Right eye exhibits no discharge. Left eye exhibits no discharge.  Neck: Neck supple. No thyromegaly present.  Cardiovascular: Normal rate and regular rhythm.   Pulmonary/Chest: Breath sounds normal. No respiratory distress. She has no wheezes.  Abdominal: Soft. Bowel sounds are normal. There is no tenderness.  Musculoskeletal: She exhibits no edema or tenderness.  Lymphadenopathy:    She has no cervical adenopathy.  Skin: No rash noted. No erythema.  Psychiatric: She has a normal mood and affect. Her behavior is normal.    BP 110/90 mmHg  Pulse 84  Temp(Src) 97.7 F (36.5 C) (Oral)  Resp 18  Ht 5\' 5"  (1.651 m)  Wt 164 lb (74.39 kg)  BMI 27.29 kg/m2  SpO2 95% Wt Readings  from Last 3 Encounters:  04/18/15 164 lb (74.39 kg)  03/21/15 163 lb 8 oz (74.163 kg)  03/13/15 162 lb 8 oz (73.71 kg)     Lab Results  Component Value Date   WBC 5.6 03/07/2015   HGB 12.2 03/07/2015   HCT 35.6 03/07/2015   PLT 228 03/07/2015   GLUCOSE 101* 04/18/2015   CHOL 143 03/06/2015   TRIG 92.0 03/06/2015   HDL 37.10* 03/06/2015   LDLCALC 88 03/06/2015   ALT 10 04/18/2015   AST 10 04/18/2015   NA 138 04/18/2015   K 4.6 04/18/2015   CL 103 04/18/2015   CREATININE 1.36* 04/18/2015   BUN 16 04/18/2015  CO2 25 04/18/2015   TSH 3.11 08/30/2014   INR 1.0 02/05/2012       Assessment & Plan:   Problem List Items Addressed This Visit    Carotid arterial disease ([place])    Carotid ultrasound reviewed as outlined in overview.  Will need to follow.  Continue risk factor modification.  Continue aspirin.       Chronic systolic CHF (congestive heart failure) ([facility]) - Primary   Relevant Orders   Hepatic function panel (Completed)   Basic metabolic panel (Completed)   CKD (chronic kidney disease), stage IV ([facility])    Recent worsening renal function.  Medications adjusted.  Recheck metabolic panel.  May need evaluation for renal artery stenosis.        COPD (chronic obstructive pulmonary disease) ([facility])    Breathing stable.  Discussed the need to stop smoking.  She declines.       Dilated cardiomyopathy ([place])    ECHO reviewed and as outlined in previous notes.  Unable to take entresto.  Did not tolerate BiDil.  Off losartan now secondary to hyperkalemia.  Has not started hydralazine.  Start as directed.  Decreased salt intake.  No volume overload on exam.        Essential hypertension    Blood pressure as outlined.  Start hydralazine.  Recheck metabolic panel.       GERD (gastroesophageal reflux disease)    No upper symptoms reported.        Hyperkalemia    Improved on last check.  Medications adjusted.  Recheck metabolic panel today.       Nonischemic cardiomyopathy  ([facility])   Relevant Orders   Hepatic function panel (Completed)   Basic metabolic panel (Completed)   Tobacco abuse    Discussed the need to stop smoking.  She declines.  Follow.           Einar Pheasant, MD

## 2015-04-18 NOTE — Progress Notes (Signed)
Pre-visit discussion using our clinic review tool. No additional management support is needed unless otherwise documented below in the visit note.  

## 2015-04-19 ENCOUNTER — Other Ambulatory Visit: Payer: Self-pay | Admitting: Nurse Practitioner

## 2015-04-19 ENCOUNTER — Telehealth: Payer: Self-pay | Admitting: Internal Medicine

## 2015-04-19 ENCOUNTER — Encounter: Payer: Self-pay | Admitting: Internal Medicine

## 2015-04-19 NOTE — Assessment & Plan Note (Signed)
Blood pressure as outlined.  Start hydralazine.  Recheck metabolic panel.

## 2015-04-19 NOTE — Assessment & Plan Note (Signed)
No upper symptoms reported.   

## 2015-04-19 NOTE — Assessment & Plan Note (Signed)
ECHO reviewed and as outlined in previous notes.  Unable to take entresto.  Did not tolerate BiDil.  Off losartan now secondary to hyperkalemia.  Has not started hydralazine.  Start as directed.  Decreased salt intake.  No volume overload on exam.

## 2015-04-19 NOTE — Telephone Encounter (Signed)
Pt states she was returning your call. Pt thinks it's in regards to lab results. Thank you!

## 2015-04-19 NOTE — Assessment & Plan Note (Signed)
Breathing stable.  Discussed the need to stop smoking.  She declines.

## 2015-04-19 NOTE — Assessment & Plan Note (Signed)
Discussed the need to stop smoking.  She declines.  Follow.  

## 2015-04-19 NOTE — Assessment & Plan Note (Signed)
Recent worsening renal function.  Medications adjusted.  Recheck metabolic panel.  May need evaluation for renal artery stenosis.

## 2015-04-19 NOTE — Assessment & Plan Note (Signed)
Carotid ultrasound reviewed as outlined in overview.  Will need to follow.  Continue risk factor modification.  Continue aspirin.

## 2015-04-19 NOTE — Assessment & Plan Note (Signed)
Improved on last check.  Medications adjusted.  Recheck metabolic panel today.

## 2015-04-22 ENCOUNTER — Other Ambulatory Visit: Payer: Self-pay

## 2015-04-22 ENCOUNTER — Other Ambulatory Visit: Payer: Self-pay | Admitting: Internal Medicine

## 2015-04-22 MED ORDER — ALBUTEROL SULFATE (2.5 MG/3ML) 0.083% IN NEBU: INHALATION_SOLUTION | RESPIRATORY_TRACT | Status: DC

## 2015-05-12 ENCOUNTER — Other Ambulatory Visit: Payer: Self-pay | Admitting: Internal Medicine

## 2015-05-29 ENCOUNTER — Emergency Department: Payer: Commercial Managed Care - HMO

## 2015-05-29 ENCOUNTER — Encounter: Payer: Self-pay | Admitting: Internal Medicine

## 2015-05-29 ENCOUNTER — Emergency Department
Admission: EM | Admit: 2015-05-29 | Discharge: 2015-05-29 | Disposition: A | Discharge status: Home / Self Care | Payer: Commercial Managed Care - HMO | Attending: Student | Admitting: Student

## 2015-05-29 ENCOUNTER — Ambulatory Visit (INDEPENDENT_AMBULATORY_CARE_PROVIDER_SITE_OTHER): Payer: Commercial Managed Care - HMO | Admitting: Internal Medicine

## 2015-05-29 VITALS — BP 138/80 | HR 92 | Temp 97.9°F | Resp 18 | Ht 65.0 in | Wt 158.0 lb

## 2015-05-29 DIAGNOSIS — Z7951 Long term (current) use of inhaled steroids: Secondary | ICD-10-CM | POA: Diagnosis not present

## 2015-05-29 DIAGNOSIS — K219 Gastro-esophageal reflux disease without esophagitis: Secondary | ICD-10-CM

## 2015-05-29 DIAGNOSIS — I5022 Chronic systolic (congestive) heart failure: Secondary | ICD-10-CM | POA: Diagnosis not present

## 2015-05-29 DIAGNOSIS — J449 Chronic obstructive pulmonary disease, unspecified: Secondary | ICD-10-CM | POA: Diagnosis not present

## 2015-05-29 DIAGNOSIS — F17210 Nicotine dependence, cigarettes, uncomplicated: Secondary | ICD-10-CM | POA: Insufficient documentation

## 2015-05-29 DIAGNOSIS — I2510 Atherosclerotic heart disease of native coronary artery without angina pectoris: Secondary | ICD-10-CM

## 2015-05-29 DIAGNOSIS — Z79899 Other long term (current) drug therapy: Secondary | ICD-10-CM | POA: Insufficient documentation

## 2015-05-29 DIAGNOSIS — I10 Essential (primary) hypertension: Secondary | ICD-10-CM

## 2015-05-29 DIAGNOSIS — Z720 Tobacco use: Secondary | ICD-10-CM

## 2015-05-29 DIAGNOSIS — Z7982 Long term (current) use of aspirin: Secondary | ICD-10-CM | POA: Insufficient documentation

## 2015-05-29 DIAGNOSIS — R0602 Shortness of breath: Secondary | ICD-10-CM

## 2015-05-29 DIAGNOSIS — N184 Chronic kidney disease, stage 4 (severe): Secondary | ICD-10-CM | POA: Diagnosis not present

## 2015-05-29 DIAGNOSIS — I129 Hypertensive chronic kidney disease with stage 1 through stage 4 chronic kidney disease, or unspecified chronic kidney disease: Secondary | ICD-10-CM | POA: Insufficient documentation

## 2015-05-29 DIAGNOSIS — J441 Chronic obstructive pulmonary disease with (acute) exacerbation: Secondary | ICD-10-CM | POA: Insufficient documentation

## 2015-05-29 LAB — CBC WITH DIFFERENTIAL/PLATELET
BASOS PCT: 1 %
Basophils Absolute: 0.1 10*3/uL (ref 0–0.1)
EOS ABS: 0.1 10*3/uL (ref 0–0.7)
Eosinophils Relative: 2 %
HCT: 35.7 % (ref 35.0–47.0)
Hemoglobin: 11.8 g/dL — ABNORMAL LOW (ref 12.0–16.0)
LYMPHS ABS: 0.8 10*3/uL — AB (ref 1.0–3.6)
Lymphocytes Relative: 12 %
MCH: 30.1 pg (ref 26.0–34.0)
MCHC: 32.9 g/dL (ref 32.0–36.0)
MCV: 91.6 fL (ref 80.0–100.0)
MONO ABS: 0.6 10*3/uL (ref 0.2–0.9)
MONOS PCT: 10 %
Neutro Abs: 5 10*3/uL (ref 1.4–6.5)
Neutrophils Relative %: 75 %
PLATELETS: 221 10*3/uL (ref 150–440)
RBC: 3.9 MIL/uL (ref 3.80–5.20)
RDW: 13.5 % (ref 11.5–14.5)
WBC: 6.7 10*3/uL (ref 3.6–11.0)

## 2015-05-29 LAB — COMPREHENSIVE METABOLIC PANEL
ALT: 11 U/L — ABNORMAL LOW (ref 14–54)
AST: 16 U/L (ref 15–41)
Albumin: 3.9 g/dL (ref 3.5–5.0)
Alkaline Phosphatase: 103 U/L (ref 38–126)
Anion gap: 8 (ref 5–15)
BUN: 21 mg/dL — ABNORMAL HIGH (ref 6–20)
CHLORIDE: 106 mmol/L (ref 101–111)
CO2: 24 mmol/L (ref 22–32)
Calcium: 9.1 mg/dL (ref 8.9–10.3)
Creatinine, Ser: 1.43 mg/dL — ABNORMAL HIGH (ref 0.44–1.00)
GFR, EST AFRICAN AMERICAN: 42 mL/min — AB (ref >60)
GFR, EST NON AFRICAN AMERICAN: 36 mL/min — AB (ref >60)
Glucose, Bld: 99 mg/dL (ref 65–99)
POTASSIUM: 3.8 mmol/L (ref 3.5–5.1)
SODIUM: 138 mmol/L (ref 135–145)
Total Bilirubin: 2 mg/dL — ABNORMAL HIGH (ref 0.3–1.2)
Total Protein: 7.1 g/dL (ref 6.5–8.1)

## 2015-05-29 LAB — TROPONIN I

## 2015-05-29 LAB — BRAIN NATRIURETIC PEPTIDE: B NATRIURETIC PEPTIDE 5: 1929 pg/mL — AB (ref 0.0–100.0)

## 2015-05-29 MED ORDER — FUROSEMIDE 40 MG PO TABS
20.0000 mg | ORAL_TABLET | Freq: Once | ORAL | Status: AC
  Administered 2015-05-29: 20 mg via ORAL
  Filled 2015-05-29: qty 1

## 2015-05-29 MED ORDER — PREDNISONE 20 MG PO TABS
60.0000 mg | ORAL_TABLET | Freq: Once | ORAL | Status: AC
  Administered 2015-05-29: 60 mg via ORAL
  Filled 2015-05-29: qty 3

## 2015-05-29 MED ORDER — AZITHROMYCIN 250 MG PO TABS
ORAL_TABLET | ORAL | Status: AC
Start: 2015-05-29 — End: 2015-06-03

## 2015-05-29 MED ORDER — AZITHROMYCIN 250 MG PO TABS
500.0000 mg | ORAL_TABLET | Freq: Once | ORAL | Status: AC
  Administered 2015-05-29: 500 mg via ORAL
  Filled 2015-05-29: qty 2

## 2015-05-29 MED ORDER — IPRATROPIUM-ALBUTEROL 0.5-2.5 (3) MG/3ML IN SOLN
3.0000 mL | Freq: Once | RESPIRATORY_TRACT | Status: AC
  Administered 2015-05-29: 3 mL via RESPIRATORY_TRACT
  Filled 2015-05-29: qty 3

## 2015-05-29 MED ORDER — ALBUTEROL SULFATE (2.5 MG/3ML) 0.083% IN NEBU: INHALATION_SOLUTION | RESPIRATORY_TRACT | Status: DC

## 2015-05-29 MED ORDER — ALBUTEROL SULFATE (2.5 MG/3ML) 0.083% IN NEBU
2.5000 mg | INHALATION_SOLUTION | Freq: Once | RESPIRATORY_TRACT | Status: AC
  Administered 2015-05-29: 2.5 mg via RESPIRATORY_TRACT

## 2015-05-29 MED ORDER — ALBUTEROL SULFATE (2.5 MG/3ML) 0.083% IN NEBU: 2.5000 mg | INHALATION_SOLUTION | Freq: Four times a day (QID) | RESPIRATORY_TRACT | Status: DC | PRN

## 2015-05-29 MED ORDER — ALBUTEROL SULFATE HFA 108 (90 BASE) MCG/ACT IN AERS: 2.0000 | INHALATION_SPRAY | Freq: Four times a day (QID) | RESPIRATORY_TRACT | Status: DC | PRN

## 2015-05-29 MED ORDER — PREDNISONE 20 MG PO TABS
60.0000 mg | ORAL_TABLET | Freq: Every day | ORAL | Status: DC
Start: 2015-05-29 — End: 2015-07-18

## 2015-05-29 NOTE — Progress Notes (Signed)
Patient ID: Sandra Ellison, female   DOB: 14-Jul-1945, 69 y.o.   MRN: [id number]   Subjective:    Patient ID: Sandra Ellison, female    DOB: 11-Aug-1945, 69 y.o.   MRN: [id number]  HPI  Patient with past history of COPD, CHF with dilated cardiomyopathy, carotid artery disease, GERD and anemia. She comes in today for a scheduled follow up.  She reports she has not felt well since starting hydralazine and adjustments in her blood pressure medications.  Her breathing worsened last night.  She is using her albuterol neb more frequently.  Increased nasal congestion and chest congestion.  Decreased appetite.  Increased sob.  Some pain and swelling in her ankles.  Increased sob walking back to the exam room.  No vomiting.  No bowel change.     Past Medical History  Diagnosis Date  . Chronic combined systolic and diastolic CHF (congestive heart failure) ([place])     a. 01/2012 Echo: EF 30-35%, mod conc LVH, sev inf HK, mildly dil LA, mild-mod MR, Trace TR, RVSP 40-6mmHg.    . Dilated cardiomyopathy ([place])     a. 02/2011 MV: EF 27%, anteroseptal, inf, inferolateral scar, no ischemia.  Marland Kitchen COPD (chronic obstructive pulmonary disease) ([place])   . Hx of colonic polyps   . Iron deficiency anemia     a. h/o transfusions, prev followed by heme/onc.  . Chronic kidney disease (CKD), stage II (mild)   . Pulmonary HTN ([place])   . Arthritis   . Allergy   . History of diverticulosis   . History of abnormal Pap smear     a. s/p vaginal hysterectomy  . GERD (gastroesophageal reflux disease)   . Hyperkalemia     a. 02/2015 in setting of entresto/AKI->entresto d/c'd.  . Carotid arterial disease ([place])     a. 02/2015 Carotid U/S: RICA [id number], LICA 123456.  . Tobacco abuse    Past Surgical History  Procedure Laterality Date  . Tonsillectomy  age 81  . Vaginal hysterectomy  1981    secondary to abnormal pap smear  . Orif distal radius fracture  2011    right  . Colonoscopy  10/2010   Family History  Problem  Relation Age of Onset  . Arthritis Mother   . Alcohol abuse Father   . Arthritis Father   . Cancer Father     throat/spine  . Breast cancer Neg Hx    Social History   Social History  . Marital Status: Single    Spouse Name: N/A  . Number of Children: 1  . Years of Education: N/A   Social History Main Topics  . Smoking status: Current Every Day Smoker -- 0.25 packs/day for 42 years    Types: Cigarettes  . Smokeless tobacco: Never Used     Comment: Quit early December 2015  . Alcohol Use: No  . Drug Use: No  . Sexual Activity: Not Asked   Other Topics Concern  . None   Social History Narrative    Outpatient Encounter Prescriptions as of 05/29/2015  Medication Sig  . albuterol (PROVENTIL) (2.5 MG/3ML) 0.083% nebulizer solution USE ONE TREATMENT EVERY 6 HOURS AS NEEDED  . aspirin EC 81 MG tablet Take 81 mg by mouth daily.  Marland Kitchen atorvastatin (LIPITOR) 40 MG tablet Take 1 tablet (40 mg total) by mouth daily.  . carvedilol (COREG) 3.125 MG tablet TAKE 1 TABLET (3.125 MG TOTAL) BY MOUTH 2 (TWO) TIMES DAILY WITH A MEAL.  . ferrous fumarate (HEMOCYTE -  106 MG FE) 325 (106 FE) MG TABS tablet Take 2 tablets by mouth daily.   Marland Kitchen gabapentin (NEURONTIN) 100 MG capsule TAKE 1 CAPSULE (100 MG TOTAL) BY MOUTH 3 (THREE) TIMES DAILY AS NEEDED.  Marland Kitchen gabapentin (NEURONTIN) 300 MG capsule TAKE ONE CAPSULE BY MOUTH 3 TIMES A DAY AS NEEDED (Patient not taking: Reported on 05/29/2015)  . hydrALAZINE (APRESOLINE) 25 MG tablet Take 25 mg by mouth 3 (three) times daily.   . pantoprazole (PROTONIX) 40 MG tablet Take 1 tablet (40 mg total) by mouth daily.  . polyethylene glycol (MIRALAX / GLYCOLAX) packet Take 17 g by mouth daily as needed for mild constipation.  Marland Kitchen tiotropium (SPIRIVA) 18 MCG inhalation capsule Place 18 mcg into inhaler and inhale daily.  . [DISCONTINUED] albuterol (PROVENTIL) (2.5 MG/3ML) 0.083% nebulizer solution USE ONE TREATMENT EVERY 6 HOURS AS NEEDED  . [EXPIRED] albuterol (PROVENTIL)  (2.5 MG/3ML) 0.083% nebulizer solution 2.5 mg    No facility-administered encounter medications on file as of 05/29/2015.    Review of Systems  Constitutional: Negative for unexpected weight change.       Decreased appetite.    HENT: Positive for congestion and postnasal drip.   Eyes: Negative for discharge and redness.  Respiratory: Positive for cough and shortness of breath. Negative for chest tightness.   Cardiovascular: Positive for leg swelling (ankle swelling). Negative for chest pain and palpitations.  Gastrointestinal: Negative for nausea, vomiting, abdominal pain and diarrhea.  Genitourinary: Negative for dysuria and difficulty urinating.  Musculoskeletal: Negative for myalgias and back pain.  Skin: Negative for color change and rash.  Neurological: Negative for dizziness, light-headedness and headaches.  Psychiatric/Behavioral: Negative for dysphoric mood and agitation.       Objective:     Pulse ox 96% post neb.   Physical Exam  HENT:  Mouth/Throat: Oropharynx is clear and moist.  Nares - slightly erythematous turbinates.    Eyes: Conjunctivae are normal. Right eye exhibits no discharge. Left eye exhibits no discharge.  Neck: Neck supple. No thyromegaly present.  Cardiovascular: Normal rate and regular rhythm.   Pulmonary/Chest: Breath sounds normal.  Increased congestion.  Increased cough.  Increased sob with minimal exertion.  Labored breathing.  Albuterol neb given.  No significant improvement in her breathign.   Abdominal: Soft. Bowel sounds are normal. There is no tenderness.  Musculoskeletal:  Minimal ankle swelling and tenderness.  No increased erythema.    Lymphadenopathy:    She has no cervical adenopathy.  Skin: No rash noted. No erythema.  Psychiatric: She has a normal mood and affect. Her behavior is normal.    BP 138/80 mmHg  Pulse 92  Temp(Src) 97.9 F (36.6 C) (Oral)  Resp 18  Ht 5\' 5"  (1.651 m)  Wt 158 lb (71.668 kg)  BMI 26.29 kg/m2  SpO2  96% Wt Readings from Last 3 Encounters:  05/29/15 153 lb (69.4 kg)  05/29/15 158 lb (71.668 kg)  04/18/15 164 lb (74.39 kg)     Lab Results  Component Value Date   WBC 6.7 05/29/2015   HGB 11.8* 05/29/2015   HCT 35.7 05/29/2015   PLT 221 05/29/2015   GLUCOSE 99 05/29/2015   CHOL 143 03/06/2015   TRIG 92.0 03/06/2015   HDL 37.10* 03/06/2015   LDLCALC 88 03/06/2015   ALT 11* 05/29/2015   AST 16 05/29/2015   NA 138 05/29/2015   K 3.8 05/29/2015   CL 106 05/29/2015   CREATININE 1.43* 05/29/2015   BUN 21* 05/29/2015   CO2 24  05/29/2015   TSH 3.11 08/30/2014   INR 1.0 02/05/2012       Assessment & Plan:   Problem List Items Addressed This Visit    CAD (coronary artery disease)    EKG as outlined.  SOB with exertion and at rest.  No significant improvement with neb treatment.  After discussion, will refer to ER for further evaluation and treatment.        Relevant Medications   hydrALAZINE (APRESOLINE) 25 MG tablet   Chronic systolic CHF (congestive heart failure) ([place])    Sees cardiology.  The sob now appears to be more c/w COPD exacerbation.  EKG obtained and reveal SR with no acute ischemic changes when compared to previous.  No significant improvement with albuterol neb.  Persistent sob and DOE.  Transfer to ER for further evaluation and treatment.        Relevant Medications   hydrALAZINE (APRESOLINE) 25 MG tablet   CKD (chronic kidney disease), stage IV ([place])    Has had recent worsening renal function.  Follow renal function.       COPD (chronic obstructive pulmonary disease) ([place])    Feel her current symptoms are due to COPD exacerbation.  Did not respond to neb treatment.  Given the amount of sob and DOE, will refer to ER for evaluation.  Need to confirm no worsening heart issues related to the COPD exacerbation.        Relevant Medications   albuterol (PROVENTIL) (2.5 MG/3ML) 0.083% nebulizer solution 2.5 mg (Completed)   albuterol (PROVENTIL) (2.5 MG/3ML)  0.083% nebulizer solution   Essential hypertension    Blood pressure under good control.  Continue same medication regimen.  Follow pressures.  Follow metabolic panel.         Relevant Medications   hydrALAZINE (APRESOLINE) 25 MG tablet   GERD (gastroesophageal reflux disease)    No upper symptoms reported.  EGD as outlined.        SOB (shortness of breath) - Primary    Sob as outlined.  EKG as outlined.  Neb given.  No improvement.   Refer to ER for further evaluation and treatment.        Relevant Medications   albuterol (PROVENTIL) (2.5 MG/3ML) 0.083% nebulizer solution 2.5 mg (Completed)   Other Relevant Orders   EKG 12-Lead (Completed)   Tobacco abuse    Discussed the need to quit.  Since being sick, she has cut down.          I spent 40 minutes with the patient and more than 50% of the time was spent in consultation regarding the above.     Einar Pheasant, MD

## 2015-05-29 NOTE — ED Notes (Addendum)
Pt c/o increased SOB for the past 2-3 days.. States she has a hx of COPD and CHF, states she takes lasix PRN but has not taken any in the past week.. Pt in NAD at present.Marland Kitchen respirations WNL.pt has a congested cough in triage

## 2015-05-29 NOTE — ED Provider Notes (Addendum)
[facility] Emergency Department Provider Note  ____________________________________________  Time seen: Approximately 2:21 PM  I have reviewed the triage vital signs and the nursing notes.   HISTORY  Chief Complaint Shortness of Breath    HPI Sandra Ellison is a 69 y.o. female history of COPD, CHF, chronic kidney disease who presents for evaluation of 3 days productive cough and shortness of breath, gradual onset, constant since onset, improves with the use of her inhaler, currently mild to moderate. The patient reports that she was seen by her primary care doctor earlier this morning who thought she may be having a COPD exacerbation but was also worried that she might be having a CHF flare so she told her to come to the emergency department. The patient has had no chest pain. No fevers. She reports the symptoms feel similar to her usual COPD exacerbations. No vomiting, diarrhea, fevers or chills. She denies any history of PE or DVT.   Past Medical History  Diagnosis Date  . Chronic combined systolic and diastolic CHF (congestive heart failure) ([place])     a. 01/2012 Echo: EF 30-35%, mod conc LVH, sev inf HK, mildly dil LA, mild-mod MR, Trace TR, RVSP 40-66mmHg.    . Dilated cardiomyopathy ([place])     a. 02/2011 MV: EF 27%, anteroseptal, inf, inferolateral scar, no ischemia.  Marland Kitchen COPD (chronic obstructive pulmonary disease) ([place])   . Hx of colonic polyps   . Iron deficiency anemia     a. h/o transfusions, prev followed by heme/onc.  . Chronic kidney disease (CKD), stage II (mild)   . Pulmonary HTN ([place])   . Arthritis   . Allergy   . History of diverticulosis   . History of abnormal Pap smear     a. s/p vaginal hysterectomy  . GERD (gastroesophageal reflux disease)   . Hyperkalemia     a. 02/2015 in setting of entresto/AKI->entresto d/c'd.  . Carotid arterial disease ([place])     a. 02/2015 Carotid U/S: RICA A999333, LICA 123456.  . Tobacco abuse     Patient  Active Problem List   Diagnosis Date Noted  . Chronic combined systolic and diastolic CHF (congestive heart failure) ([place])   . Dilated cardiomyopathy ([place])   . Carotid arterial disease ([place])   . Tobacco abuse   . Diarrhea 03/17/2015  . Essential hypertension 03/17/2015  . Hyperkalemia 03/06/2015  . CKD (chronic kidney disease), stage IV ([place]) 03/06/2015  . Abnormal mammogram 02/23/2015  . Encounter for screening colonoscopy 01/26/2015  . Health care maintenance 08/12/2014  . Acute bronchitis 05/21/2014  . Chronic systolic CHF (congestive heart failure) ([place]) 05/21/2014  . Herpes zoster 02/01/2014  . Microcalcifications of the breast 01/24/2014  . Knee pain, bilateral 07/22/2013  . Hematuria 05/20/2013  . Pulmonary nodule 05/20/2013  . Insomnia 03/08/2013  . Left knee pain 03/08/2013  . Ulnar nerve injury 03/07/2013  . GERD (gastroesophageal reflux disease) 02/28/2013  . History of colonic polyps 02/28/2013  . Diverticulosis 02/28/2013  . Anemia 02/28/2013  . COPD (chronic obstructive pulmonary disease) ([place]) 02/28/2013  . Pulmonary hypertension ([place]) 02/28/2013  . Nonischemic cardiomyopathy ([place]) 02/26/2012  . Carotid bruit 02/26/2012  . SOB (shortness of breath) 03/03/2011  . Smoking 03/03/2011  . Hyperlipidemia 03/03/2011  . CAD (coronary artery disease) 03/03/2011    Past Surgical History  Procedure Laterality Date  . Tonsillectomy  age 40  . Vaginal hysterectomy  1981    secondary to abnormal pap smear  . Orif distal radius  fracture  2011    right  . Colonoscopy  10/2010    Current Outpatient Rx  Name  Route  Sig  Dispense  Refill  . albuterol (PROVENTIL) (2.5 MG/3ML) 0.083% nebulizer solution      USE ONE TREATMENT EVERY 6 HOURS AS NEEDED   180 mL   3   . aspirin EC 81 MG tablet   Oral   Take 81 mg by mouth daily.         Marland Kitchen atorvastatin (LIPITOR) 40 MG tablet   Oral   Take 1 tablet (40 mg total) by mouth daily.   30 tablet   11   . carvedilol  (COREG) 3.125 MG tablet      TAKE 1 TABLET (3.125 MG TOTAL) BY MOUTH 2 (TWO) TIMES DAILY WITH A MEAL.   60 tablet   1   . ferrous fumarate (HEMOCYTE - 106 MG FE) 325 (106 FE) MG TABS tablet   Oral   Take 2 tablets by mouth daily.          Marland Kitchen gabapentin (NEURONTIN) 100 MG capsule      TAKE 1 CAPSULE (100 MG TOTAL) BY MOUTH 3 (THREE) TIMES DAILY AS NEEDED.   90 capsule   1   . gabapentin (NEURONTIN) 300 MG capsule      TAKE ONE CAPSULE BY MOUTH 3 TIMES A DAY AS NEEDED   90 capsule   0   . hydrALAZINE (APRESOLINE) 25 MG tablet               . pantoprazole (PROTONIX) 40 MG tablet   Oral   Take 1 tablet (40 mg total) by mouth daily.   30 tablet   3   . polyethylene glycol (MIRALAX / GLYCOLAX) packet   Oral   Take 17 g by mouth daily as needed for mild constipation.         Marland Kitchen tiotropium (SPIRIVA) 18 MCG inhalation capsule   Inhalation   Place 18 mcg into inhaler and inhale daily.           Allergies Ranitidine hcl and Entresto  Family History  Problem Relation Age of Onset  . Arthritis Mother   . Alcohol abuse Father   . Arthritis Father   . Cancer Father     throat/spine  . Breast cancer Neg Hx     Social History Social History  Substance Use Topics  . Smoking status: Current Every Day Smoker -- 0.25 packs/day for 42 years    Types: Cigarettes  . Smokeless tobacco: Never Used     Comment: Quit early December 2015  . Alcohol Use: No    Review of Systems Constitutional: No fever/chills Eyes: No visual changes. ENT: No sore throat. Cardiovascular: Denies chest pain. Respiratory: + shortness of breath. Gastrointestinal: No abdominal pain.  No nausea, no vomiting.  No diarrhea.  No constipation. Genitourinary: Negative for dysuria. Musculoskeletal: Negative for back pain. Skin: Negative for rash. Neurological: Negative for headaches, focal weakness or numbness.  10-point ROS otherwise  negative.  ____________________________________________   PHYSICAL EXAM:  VITAL SIGNS: ED Triage Vitals  Enc Vitals Group     BP 05/29/15 1221 124/80 mmHg     Pulse Rate 05/29/15 1221 88     Resp 05/29/15 1221 17     Temp 05/29/15 1221 98.1 F (36.7 C)     Temp Source 05/29/15 1221 Oral     SpO2 05/29/15 1221 96 %     Weight 05/29/15 1221  153 lb (69.4 kg)     Height 05/29/15 1221 5\' 5"  (1.651 m)     Head Cir --      Peak Flow --      Pain Score --      Pain Loc --      Pain Edu? --      Excl. in [place]? --     Constitutional: Alert and oriented. Well appearing and in no acute distress. Pleasant, talkative, interactive. Eyes: Conjunctivae are normal. PERRL. EOMI. Head: Atraumatic. Nose: No congestion/rhinnorhea. Mouth/Throat: Mucous membranes are moist.  Oropharynx non-erythematous. Neck: No stridor.  Cardiovascular: Normal rate, regular rhythm. Grossly normal heart sounds.  Good peripheral circulation. Respiratory: Normal respiratory effort.  No retractions. Faint expiratory wheeze with good air movement. Gastrointestinal: Soft and nontender. No distention. No CVA tenderness. Genitourinary: deferred Musculoskeletal: No lower extremity tenderness nor edema.  No joint effusions. Neurologic:  Normal speech and language. No gross focal neurologic deficits are appreciated. No gait instability. Skin:  Skin is warm, dry and intact. No rash noted. Psychiatric: Mood and affect are normal. Speech and behavior are normal.  ____________________________________________   LABS (all labs ordered are listed, but only abnormal results are displayed)  Labs Reviewed  CBC WITH DIFFERENTIAL/PLATELET - Abnormal; Notable for the following:    Hemoglobin 11.8 (*)    Lymphs Abs 0.8 (*)    All other components within normal limits  COMPREHENSIVE METABOLIC PANEL - Abnormal; Notable for the following:    BUN 21 (*)    Creatinine, Ser 1.43 (*)    ALT 11 (*)    Total Bilirubin 2.0 (*)    GFR  calc non Af Amer 36 (*)    GFR calc Af Amer 42 (*)    All other components within normal limits  BRAIN NATRIURETIC PEPTIDE - Abnormal; Notable for the following:    B Natriuretic Peptide 1929.0 (*)    All other components within normal limits  TROPONIN I   ____________________________________________  EKG  ED ECG REPORT I, Joanne Gavel, the attending physician, personally viewed and interpreted this ECG.   Date: 05/29/2015  EKG Time: 12:30  Rate: 89  Rhythm: normal sinus rhythm with PACs  Axis: normal  Intervals:none  ST&T Change: No acute ST elevation. T-wave inversion in V5, V6. KG is similar to 03/21/2015 when accounting for differences in lead placement.  ____________________________________________  RADIOLOGY  CXR IMPRESSION: Cardiomegaly. Hyperinflation/ COPD. Stable diffuse interstitial prominence, likely chronic interstitial lung disease.  ____________________________________________   PROCEDURES  Procedure(s) performed: None  Critical Care performed: No  ____________________________________________   INITIAL IMPRESSION / ASSESSMENT AND PLAN / ED COURSE  Pertinent labs & imaging results that were available during my care of the patient were reviewed by me and considered in my medical decision making (see chart for details).  Amara TOCCARA WIGAL is a 69 y.o. female history of COPD, CHF, chronic kidney disease who presents for evaluation of 3 days productive cough and shortness of breath which feels similar to usual COPD exacerbations. On exam, she is very well-appearing and in no acute distress. Vital signs are stable, she is afebrile. O2 sat is 96% on room air and she is not requiring any oxygen. She has no increased work of breathing and has good air movement but she does have a faint expiratory wheeze. Suspect Mild COPD exacerbation. We'll give nebulizer treatment here and anticipate discharge with steroids and azithromycin. She does not appear grossly volume  overloaded to me, chest x-ray shows no  evidence of interstitial edema however her BNP is elevated at 1900 so will give 1 dose of by mouth Lasix here as she may have onset of mild CHF exacerbation. Troponin is negative, EKG unchanged from prior, no chest pain, doubt ACS. Doubt PE. CBC notable for mild anemia. CMP notable for mild creatinine elevation at 1.43 which is chronic. Dr. Dineen Kid will reassess after treatments are received that I anticipate she'll be discharged as above. We already discussed meticulous return precautions and she is comfortable with the discharge plan. ____________________________________________   FINAL CLINICAL IMPRESSION(S) / ED DIAGNOSES  Final diagnoses:  Chronic obstructive pulmonary disease, unspecified COPD type ([place])  SOB (shortness of breath)      Joanne Gavel, MD 05/29/15 [place] Jari Dipasquale, MD 05/29/15 1530

## 2015-05-29 NOTE — ED Provider Notes (Signed)
Patient with COPD history coming in to the emergency department for shortness of breath. Sign out to me was to reevaluate after treatment. Patient has received nebulizer as well as steroids in the emergency department. Physical Exam  BP 147/70 mmHg  Pulse 91  Temp(Src) 98.1 F (36.7 C) (Oral)  Resp 24  Ht 5\' 5"  (1.651 m)  Wt 153 lb (69.4 kg)  BMI 25.46 kg/m2  SpO2 96% ----------------------------------------- 4:39 PM on 05/29/2015 -----------------------------------------   Physical Exam Patient without respiratory distress this time. Lungs are clear to auscultation bilaterally. Patient is speaking in full sentences and without any distress. ED Course  Procedures  MDM Patient says that she feels much improved after treatment. I asked her about her respiratory rate which has been as high as 30 during her stay in the emergency department. Most recently was 50 and 24. The patient says that she does breathe slightly fast at her baseline and does not feel abnormal at this time. Patient says that she feels better and is ready go home. Is requesting albuterol Nebules for her nebulizer machine. Patient is aware that she should continue to try to reduce her smoking. She says that she smokes a pack of cigarettes every 2-3 days.      Orbie Pyo, MD 05/29/15 1640

## 2015-05-29 NOTE — Progress Notes (Signed)
Pre-visit discussion using our clinic review tool. No additional management support is needed unless otherwise documented below in the visit note.  

## 2015-06-03 ENCOUNTER — Encounter: Payer: Self-pay | Admitting: Internal Medicine

## 2015-06-03 NOTE — Assessment & Plan Note (Signed)
Blood pressure under good control.  Continue same medication regimen.  Follow pressures.  Follow metabolic panel.   

## 2015-06-03 NOTE — Assessment & Plan Note (Signed)
Sees cardiology.  The sob now appears to be more c/w COPD exacerbation.  EKG obtained and reveal SR with no acute ischemic changes when compared to previous.  No significant improvement with albuterol neb.  Persistent sob and DOE.  Transfer to ER for further evaluation and treatment.

## 2015-06-03 NOTE — Assessment & Plan Note (Signed)
Sob as outlined.  EKG as outlined.  Neb given.  No improvement.   Refer to ER for further evaluation and treatment.

## 2015-06-03 NOTE — Assessment & Plan Note (Signed)
Has had recent worsening renal function.  Follow renal function.

## 2015-06-03 NOTE — Assessment & Plan Note (Signed)
No upper symptoms reported.  EGD as outlined.

## 2015-06-03 NOTE — Assessment & Plan Note (Signed)
Feel her current symptoms are due to COPD exacerbation.  Did not respond to neb treatment.  Given the amount of sob and DOE, will refer to ER for evaluation.  Need to confirm no worsening heart issues related to the COPD exacerbation.

## 2015-06-03 NOTE — Assessment & Plan Note (Signed)
Discussed the need to quit.  Since being sick, she has cut down.

## 2015-06-03 NOTE — Assessment & Plan Note (Signed)
EKG as outlined.  SOB with exertion and at rest.  No significant improvement with neb treatment.  After discussion, will refer to ER for further evaluation and treatment.

## 2015-06-04 ENCOUNTER — Telehealth: Payer: Self-pay | Admitting: Internal Medicine

## 2015-06-04 NOTE — Telephone Encounter (Signed)
Dr.Scott please advise what you'd like to do.

## 2015-06-04 NOTE — Telephone Encounter (Signed)
Left a voicemail giving her the appointment time.

## 2015-06-04 NOTE — Telephone Encounter (Signed)
Need to know how she is doing after ER visit.  Is breathing better, etc?  Just need to know how urgent appt she needs since I am out of the office at the end of the week.  Thanks

## 2015-06-04 NOTE — Telephone Encounter (Signed)
Patient stated she is doing better and breathing as improved.

## 2015-06-04 NOTE — Telephone Encounter (Signed)
If ok to wait until next week, I will work her in on 06/12/15 at 12:30.  Let me know if any problems.

## 2015-06-04 NOTE — Telephone Encounter (Signed)
Pt called wanting to get a follow up appt for when she went to the ED on 05/29/2015. Diagnosis is COPD. No appt avail to sch. Pt only wants to see Dr Nicki Reaper. Let me know where to sch pt. Thank You!

## 2015-06-11 ENCOUNTER — Other Ambulatory Visit: Payer: Self-pay | Admitting: Internal Medicine

## 2015-06-12 ENCOUNTER — Ambulatory Visit (INDEPENDENT_AMBULATORY_CARE_PROVIDER_SITE_OTHER): Payer: Commercial Managed Care - HMO | Admitting: Internal Medicine

## 2015-06-12 ENCOUNTER — Encounter: Payer: Self-pay | Admitting: Internal Medicine

## 2015-06-12 VITALS — BP 128/78 | HR 78 | Ht 65.0 in | Wt 153.0 lb

## 2015-06-12 DIAGNOSIS — R911 Solitary pulmonary nodule: Secondary | ICD-10-CM | POA: Diagnosis not present

## 2015-06-12 DIAGNOSIS — K219 Gastro-esophageal reflux disease without esophagitis: Secondary | ICD-10-CM

## 2015-06-12 DIAGNOSIS — Z720 Tobacco use: Secondary | ICD-10-CM

## 2015-06-12 DIAGNOSIS — M25562 Pain in left knee: Secondary | ICD-10-CM

## 2015-06-12 DIAGNOSIS — J449 Chronic obstructive pulmonary disease, unspecified: Secondary | ICD-10-CM

## 2015-06-12 DIAGNOSIS — N184 Chronic kidney disease, stage 4 (severe): Secondary | ICD-10-CM

## 2015-06-12 DIAGNOSIS — E785 Hyperlipidemia, unspecified: Secondary | ICD-10-CM

## 2015-06-12 DIAGNOSIS — I779 Disorder of arteries and arterioles, unspecified: Secondary | ICD-10-CM

## 2015-06-12 DIAGNOSIS — I10 Essential (primary) hypertension: Secondary | ICD-10-CM

## 2015-06-12 DIAGNOSIS — M79672 Pain in left foot: Secondary | ICD-10-CM

## 2015-06-12 DIAGNOSIS — M25475 Effusion, left foot: Secondary | ICD-10-CM | POA: Diagnosis not present

## 2015-06-12 DIAGNOSIS — I2510 Atherosclerotic heart disease of native coronary artery without angina pectoris: Secondary | ICD-10-CM

## 2015-06-12 DIAGNOSIS — D649 Anemia, unspecified: Secondary | ICD-10-CM

## 2015-06-12 DIAGNOSIS — M25561 Pain in right knee: Secondary | ICD-10-CM

## 2015-06-12 DIAGNOSIS — I739 Peripheral vascular disease, unspecified: Secondary | ICD-10-CM

## 2015-06-12 DIAGNOSIS — M7989 Other specified soft tissue disorders: Secondary | ICD-10-CM

## 2015-06-12 DIAGNOSIS — I5022 Chronic systolic (congestive) heart failure: Secondary | ICD-10-CM

## 2015-06-12 MED ORDER — INTEGRA 62.5-62.5-40-3 MG PO CAPS: ORAL_CAPSULE | ORAL | Status: DC

## 2015-06-12 MED ORDER — ALBUTEROL SULFATE (2.5 MG/3ML) 0.083% IN NEBU: INHALATION_SOLUTION | RESPIRATORY_TRACT | Status: DC

## 2015-06-12 NOTE — Progress Notes (Signed)
Patient ID: Sandra Ellison, female   DOB: 05-29-1946, 70 y.o.   MRN: [id number]   Subjective:    Patient ID: Sandra Ellison, female    DOB: 03/30/1946, 70 y.o.   MRN: [id number]  HPI  Patient with past history of CHF, COPD, CKD, carotid artery disease, tobacco abuse and GERD.  She comes in today for an ER follow up.  She was recently evaluated for sob, cough and congestion.  See ER note for details.  She was given albuterol neg, prednisone and azithromycin.  She feels better.  Cough and breathing better.  She does report some ankle swelling - left > right.  This has been present for a while.  Previous remote injury - left.  She wants xray.  She also reports some decreased appetite when she is constipated.  Bowels are moving better now.  Appetite better now since no constipation.  She is also taking iron.  She feels her iron is contributing to her constipation.  She also reports some increased pain in her knees.  Better when she starts moving around.  Some burning - mid lower leg down to feet.     Past Medical History  Diagnosis Date  . Chronic combined systolic and diastolic CHF (congestive heart failure) ([place])     a. 01/2012 Echo: EF 30-35%, mod conc LVH, sev inf HK, mildly dil LA, mild-mod MR, Trace TR, RVSP 40-82mmHg.    . Dilated cardiomyopathy ([place])     a. 02/2011 MV: EF 27%, anteroseptal, inf, inferolateral scar, no ischemia.  Marland Kitchen COPD (chronic obstructive pulmonary disease) ([place])   . Hx of colonic polyps   . Iron deficiency anemia     a. h/o transfusions, prev followed by heme/onc.  . Chronic kidney disease (CKD), stage II (mild)   . Pulmonary HTN ([place])   . Arthritis   . Allergy   . History of diverticulosis   . History of abnormal Pap smear     a. s/p vaginal hysterectomy  . GERD (gastroesophageal reflux disease)   . Hyperkalemia     a. 02/2015 in setting of entresto/AKI->entresto d/c'd.  . Carotid arterial disease ([place])     a. 02/2015 Carotid U/S: RICA A999333, LICA 123456.  .  Tobacco abuse    Past Surgical History  Procedure Laterality Date  . Tonsillectomy  age 25  . Vaginal hysterectomy  1981    secondary to abnormal pap smear  . Orif distal radius fracture  2011    right  . Colonoscopy  10/2010   Family History  Problem Relation Age of Onset  . Arthritis Mother   . Alcohol abuse Father   . Arthritis Father   . Cancer Father     throat/spine  . Breast cancer Neg Hx    Social History   Social History  . Marital Status: Single    Spouse Name: N/A  . Number of Children: 1  . Years of Education: N/A   Social History Main Topics  . Smoking status: Current Every Day Smoker -- 0.25 packs/day for 42 years    Types: Cigarettes  . Smokeless tobacco: Never Used     Comment: Quit early December 2015  . Alcohol Use: No  . Drug Use: No  . Sexual Activity: Not Asked   Other Topics Concern  . None   Social History Narrative    Outpatient Encounter Prescriptions as of 06/12/2015  Medication Sig  . albuterol (PROVENTIL HFA;VENTOLIN HFA) 108 (90 BASE) MCG/ACT inhaler Inhale 2  puffs into the lungs every 6 (six) hours as needed for wheezing or shortness of breath.  Marland Kitchen albuterol (PROVENTIL) (2.5 MG/3ML) 0.083% nebulizer solution Take 3 mLs (2.5 mg total) by nebulization every 6 (six) hours as needed for wheezing or shortness of breath.  Marland Kitchen albuterol (PROVENTIL) (2.5 MG/3ML) 0.083% nebulizer solution USE ONE TREATMENT EVERY 6 HOURS AS NEEDED  . aspirin EC 81 MG tablet Take 81 mg by mouth daily.  Marland Kitchen atorvastatin (LIPITOR) 40 MG tablet Take 1 tablet (40 mg total) by mouth daily.  . carvedilol (COREG) 3.125 MG tablet TAKE 1 TABLET (3.125 MG TOTAL) BY MOUTH 2 (TWO) TIMES DAILY WITH A MEAL.  Marland Kitchen gabapentin (NEURONTIN) 100 MG capsule TAKE 1 CAPSULE (100 MG TOTAL) BY MOUTH 3 (THREE) TIMES DAILY AS NEEDED.  Marland Kitchen gabapentin (NEURONTIN) 300 MG capsule TAKE ONE CAPSULE BY MOUTH 3 TIMES A DAY AS NEEDED  . hydrALAZINE (APRESOLINE) 25 MG tablet Take 25 mg by mouth 3 (three) times  daily.   . Nebulizer MISC USE AS DIRECTED  . pantoprazole (PROTONIX) 40 MG tablet Take 1 tablet (40 mg total) by mouth daily.  . polyethylene glycol (MIRALAX / GLYCOLAX) packet Take 17 g by mouth daily as needed for mild constipation.  Marland Kitchen tiotropium (SPIRIVA) 18 MCG inhalation capsule Place 18 mcg into inhaler and inhale daily.  . [DISCONTINUED] albuterol (PROVENTIL) (2.5 MG/3ML) 0.083% nebulizer solution USE ONE TREATMENT EVERY 6 HOURS AS NEEDED  . [DISCONTINUED] ferrous fumarate (HEMOCYTE - 106 MG FE) 325 (106 FE) MG TABS tablet Take 2 tablets by mouth daily.   . Fe Fum-FePoly-Vit C-Vit B3 (INTEGRA) 62.5-62.5-40-3 MG CAPS One per day  . predniSONE (DELTASONE) 20 MG tablet Take 3 tablets (60 mg total) by mouth daily. (Patient not taking: Reported on 06/12/2015)   No facility-administered encounter medications on file as of 06/12/2015.    Review of Systems  Constitutional: Positive for appetite change (associated with constipation. ). Negative for fever.  HENT: Negative for congestion and sinus pressure.   Eyes: Negative for pain and discharge.  Respiratory: Negative for cough, chest tightness and shortness of breath.   Cardiovascular: Positive for leg swelling. Negative for chest pain and palpitations.  Gastrointestinal: Negative for nausea, vomiting, abdominal pain and diarrhea.  Genitourinary: Negative for dysuria and difficulty urinating.  Musculoskeletal: Negative for myalgias and back pain.  Skin: Negative for color change and rash.  Neurological: Negative for dizziness, light-headedness and headaches.  Psychiatric/Behavioral: Negative for dysphoric mood and agitation.       Objective:     Blood pressure rechecked by me:  128/78  Physical Exam  Constitutional: She appears well-developed and well-nourished. No distress.  HENT:  Nose: Nose normal.  Mouth/Throat: Oropharynx is clear and moist.  Eyes: Conjunctivae are normal. Right eye exhibits no discharge. Left eye exhibits no  discharge.  Neck: Neck supple. No thyromegaly present.  Cardiovascular: Normal rate and regular rhythm.   Pulmonary/Chest: Breath sounds normal. No respiratory distress. She has no wheezes.  Abdominal: Soft. Bowel sounds are normal. There is no tenderness.  Musculoskeletal: She exhibits no tenderness.  Pedal edema L>R  Lymphadenopathy:    She has no cervical adenopathy.  Skin: No rash noted. No erythema.  Psychiatric: She has a normal mood and affect. Her behavior is normal.    BP 128/78 mmHg  Pulse 78  Ht 5\' 5"  (1.651 m)  Wt 153 lb (69.4 kg)  BMI 25.46 kg/m2  SpO2 98% Wt Readings from Last 3 Encounters:  06/12/15 153 lb (69.4  kg)  05/29/15 153 lb (69.4 kg)  05/29/15 158 lb (71.668 kg)     Lab Results  Component Value Date   WBC 6.7 05/29/2015   HGB 11.8* 05/29/2015   HCT 35.7 05/29/2015   PLT 221 05/29/2015   GLUCOSE 99 05/29/2015   CHOL 143 03/06/2015   TRIG 92.0 03/06/2015   HDL 37.10* 03/06/2015   LDLCALC 88 03/06/2015   ALT 11* 05/29/2015   AST 16 05/29/2015   NA 138 05/29/2015   K 3.8 05/29/2015   CL 106 05/29/2015   CREATININE 1.43* 05/29/2015   BUN 21* 05/29/2015   CO2 24 05/29/2015   TSH 3.11 08/30/2014   INR 1.0 02/05/2012    Dg Chest 2 View  05/29/2015  CLINICAL DATA:  Shortness of breath, worsening over night. History of COPD EXAM: CHEST  2 VIEW COMPARISON:  05/23/2014 FINDINGS: Diffuse interstitial prominence throughout the lungs again noted, stable, likely chronic interstitial lung disease. Cardiomegaly. Hyperinflation of the lungs compatible with COPD. No effusions. Bibasilar densities, likely atelectasis. IMPRESSION: Cardiomegaly. Hyperinflation/ COPD. Stable diffuse interstitial prominence, likely chronic interstitial lung disease. Electronically Signed   By: Rolm Baptise M.D.   On: 05/29/2015 12:51       Assessment & Plan:   Problem List Items Addressed This Visit    Anemia   Relevant Medications   Fe Fum-FePoly-Vit C-Vit B3 (INTEGRA)  62.5-62.5-40-3 MG CAPS   Other Relevant Orders   CBC with Differential/Platelet   Ferritin   Vitamin B12   CAD (coronary artery disease)    No chest pain or tightness.  Breathing stable.  Continue same medication regimen.  Keep f/u appt with cardiology.        Carotid arterial disease ([place])    Carotid ultrasound results reviewed.  Continue daily aspirin.        Chronic systolic CHF (congestive heart failure) ([place])    Followed by cardiology.  Breathing stable.  Continue current medication regimen.  Check metabolic panel.  Keep f/u appt with cardiology.        CKD (chronic kidney disease), stage IV ([facility])    Recent cr 1.3-1.4.  Follow metabolic panel.  Avoid antiinflammatories.        COPD (chronic obstructive pulmonary disease) ([facility])    Breathing stable.  Refill nebs.  Same medication regimen.  CXR reviewed.        Relevant Medications   albuterol (PROVENTIL) (2.5 MG/3ML) 0.083% nebulizer solution   Essential hypertension    Blood pressure is doing better on current medication regimen.  Continue same medication regimen.  Follow pressures.  Follow metabolic panel.        Relevant Orders   Basic metabolic panel   Foot swelling    Some pedal edema.  Discussed support hose.  Left > right.  Check xray.        Relevant Orders   TSH   GERD (gastroesophageal reflux disease)    EGD previously as outlined.  No upper symptoms reported.  Protonix.       Hyperlipidemia    On simvastatin.  Low cholesterol diet and exercise.  Follow lipid panel and liver function tests.        Relevant Orders   Lipid panel   Hepatic function panel   Knee pain, bilateral    Better when she starts ambulating.  Follow.        Pulmonary nodule    Reviewed previous CT.  Given pulmonary nodule, repeat chest CT.  Tobacco abuse    Discussed again the need to quit.  She has cut back.  Follow.        Relevant Orders   CT Chest Wo Contrast    Other Visit Diagnoses    Foot pain, left    -   Primary    Relevant Orders    DG Foot 2 Views Left    Lung nodule        Relevant Orders    CT Chest Wo Contrast    Swelling of foot joint, left        Relevant Orders    DG Foot 2 Views Left      I spent 40 minutes with the patient and more than 50% of the time was spent in consultation regarding the above.    Einar Pheasant, MD

## 2015-06-15 ENCOUNTER — Encounter: Payer: Self-pay | Admitting: Internal Medicine

## 2015-06-15 DIAGNOSIS — M7989 Other specified soft tissue disorders: Secondary | ICD-10-CM | POA: Insufficient documentation

## 2015-06-15 NOTE — Assessment & Plan Note (Signed)
On simvastatin.  Low cholesterol diet and exercise.  Follow lipid panel and liver function tests.   

## 2015-06-15 NOTE — Assessment & Plan Note (Signed)
Reviewed previous CT.  Given pulmonary nodule, repeat chest CT.

## 2015-06-15 NOTE — Assessment & Plan Note (Signed)
Some pedal edema.  Discussed support hose.  Left > right.  Check xray.

## 2015-06-15 NOTE — Assessment & Plan Note (Signed)
No chest pain or tightness.  Breathing stable.  Continue same medication regimen.  Keep f/u appt with cardiology.

## 2015-06-15 NOTE — Assessment & Plan Note (Signed)
EGD previously as outlined.  No upper symptoms reported.  Protonix.

## 2015-06-15 NOTE — Assessment & Plan Note (Signed)
Breathing stable.  Refill nebs.  Same medication regimen.  CXR reviewed.

## 2015-06-15 NOTE — Assessment & Plan Note (Signed)
Discussed again the need to quit.  She has cut back.  Follow.

## 2015-06-15 NOTE — Assessment & Plan Note (Signed)
Blood pressure is doing better on current medication regimen.  Continue same medication regimen.  Follow pressures.  Follow metabolic panel.

## 2015-06-15 NOTE — Assessment & Plan Note (Signed)
Carotid ultrasound results reviewed.  Continue daily aspirin.

## 2015-06-15 NOTE — Assessment & Plan Note (Signed)
Better when she starts ambulating.  Follow.

## 2015-06-15 NOTE — Assessment & Plan Note (Signed)
Followed by cardiology.  Breathing stable.  Continue current medication regimen.  Check metabolic panel.  Keep f/u appt with cardiology.

## 2015-06-15 NOTE — Assessment & Plan Note (Signed)
Recent cr 1.3-1.4.  Follow metabolic panel.  Avoid antiinflammatories.

## 2015-06-19 ENCOUNTER — Ambulatory Visit
Admission: RE | Admit: 2015-06-19 | Discharge: 2015-06-19 | Disposition: A | Discharge status: Home / Self Care | Payer: Commercial Managed Care - HMO | Source: Ambulatory Visit | Attending: Internal Medicine | Admitting: Internal Medicine

## 2015-06-19 DIAGNOSIS — J449 Chronic obstructive pulmonary disease, unspecified: Secondary | ICD-10-CM | POA: Diagnosis not present

## 2015-06-19 DIAGNOSIS — M25475 Effusion, left foot: Secondary | ICD-10-CM

## 2015-06-19 DIAGNOSIS — M79672 Pain in left foot: Secondary | ICD-10-CM | POA: Diagnosis not present

## 2015-06-19 DIAGNOSIS — M7989 Other specified soft tissue disorders: Secondary | ICD-10-CM | POA: Diagnosis not present

## 2015-06-19 DIAGNOSIS — R911 Solitary pulmonary nodule: Secondary | ICD-10-CM | POA: Insufficient documentation

## 2015-06-19 DIAGNOSIS — Z720 Tobacco use: Secondary | ICD-10-CM

## 2015-07-01 ENCOUNTER — Other Ambulatory Visit: Payer: Self-pay | Admitting: Internal Medicine

## 2015-07-01 NOTE — Telephone Encounter (Signed)
Refilled spiriva one month with 5 refills.

## 2015-07-01 NOTE — Telephone Encounter (Signed)
Received refill electronically Last office visit 06/12/15 Refill request from pharmacy shows as needed which does not match medication list which shows daily Please confirm directions

## 2015-07-18 ENCOUNTER — Encounter: Payer: Self-pay | Admitting: Emergency Medicine

## 2015-07-18 ENCOUNTER — Emergency Department
Admission: EM | Admit: 2015-07-18 | Discharge: 2015-07-18 | Disposition: A | Discharge status: Home / Self Care | Payer: Commercial Managed Care - HMO | Attending: Emergency Medicine | Admitting: Emergency Medicine

## 2015-07-18 ENCOUNTER — Emergency Department: Payer: Commercial Managed Care - HMO

## 2015-07-18 DIAGNOSIS — Z79899 Other long term (current) drug therapy: Secondary | ICD-10-CM | POA: Insufficient documentation

## 2015-07-18 DIAGNOSIS — R0602 Shortness of breath: Secondary | ICD-10-CM | POA: Diagnosis not present

## 2015-07-18 DIAGNOSIS — R112 Nausea with vomiting, unspecified: Secondary | ICD-10-CM | POA: Diagnosis not present

## 2015-07-18 DIAGNOSIS — I129 Hypertensive chronic kidney disease with stage 1 through stage 4 chronic kidney disease, or unspecified chronic kidney disease: Secondary | ICD-10-CM | POA: Insufficient documentation

## 2015-07-18 DIAGNOSIS — N184 Chronic kidney disease, stage 4 (severe): Secondary | ICD-10-CM | POA: Insufficient documentation

## 2015-07-18 DIAGNOSIS — J441 Chronic obstructive pulmonary disease with (acute) exacerbation: Secondary | ICD-10-CM | POA: Diagnosis not present

## 2015-07-18 DIAGNOSIS — Z7982 Long term (current) use of aspirin: Secondary | ICD-10-CM | POA: Diagnosis not present

## 2015-07-18 DIAGNOSIS — F17210 Nicotine dependence, cigarettes, uncomplicated: Secondary | ICD-10-CM | POA: Diagnosis not present

## 2015-07-18 HISTORY — DX: Essential (primary) hypertension: I10

## 2015-07-18 LAB — CBC
HEMATOCRIT: 34.1 % — AB (ref 35.0–47.0)
Hemoglobin: 11.5 g/dL — ABNORMAL LOW (ref 12.0–16.0)
MCH: 29.6 pg (ref 26.0–34.0)
MCHC: 33.6 g/dL (ref 32.0–36.0)
MCV: 88.1 fL (ref 80.0–100.0)
Platelets: 197 10*3/uL (ref 150–440)
RBC: 3.87 MIL/uL (ref 3.80–5.20)
RDW: 15.1 % — AB (ref 11.5–14.5)
WBC: 6.5 10*3/uL (ref 3.6–11.0)

## 2015-07-18 LAB — COMPREHENSIVE METABOLIC PANEL
ALT: 11 U/L — ABNORMAL LOW (ref 14–54)
AST: 19 U/L (ref 15–41)
Albumin: 3.7 g/dL (ref 3.5–5.0)
Alkaline Phosphatase: 94 U/L (ref 38–126)
Anion gap: 11 (ref 5–15)
BILIRUBIN TOTAL: 2.1 mg/dL — AB (ref 0.3–1.2)
BUN: 21 mg/dL — AB (ref 6–20)
CALCIUM: 8.8 mg/dL — AB (ref 8.9–10.3)
CO2: 21 mmol/L — ABNORMAL LOW (ref 22–32)
CREATININE: 1.31 mg/dL — AB (ref 0.44–1.00)
Chloride: 105 mmol/L (ref 101–111)
GFR, EST AFRICAN AMERICAN: 47 mL/min — AB (ref >60)
GFR, EST NON AFRICAN AMERICAN: 41 mL/min — AB (ref >60)
GLUCOSE: 99 mg/dL (ref 65–99)
Potassium: 4 mmol/L (ref 3.5–5.1)
Sodium: 137 mmol/L (ref 135–145)
TOTAL PROTEIN: 6.8 g/dL (ref 6.5–8.1)

## 2015-07-18 LAB — LIPASE, BLOOD: Lipase: 16 U/L (ref 11–51)

## 2015-07-18 MED ORDER — IPRATROPIUM-ALBUTEROL 0.5-2.5 (3) MG/3ML IN SOLN
3.0000 mL | Freq: Once | RESPIRATORY_TRACT | Status: AC
  Administered 2015-07-18: 3 mL via RESPIRATORY_TRACT
  Filled 2015-07-18: qty 3

## 2015-07-18 MED ORDER — PREDNISONE 20 MG PO TABS: 40.0000 mg | ORAL_TABLET | Freq: Every day | ORAL | Status: DC

## 2015-07-18 MED ORDER — PREDNISONE 20 MG PO TABS
40.0000 mg | ORAL_TABLET | Freq: Once | ORAL | Status: AC
  Administered 2015-07-18: 40 mg via ORAL
  Filled 2015-07-18: qty 2

## 2015-07-18 NOTE — ED Notes (Signed)
Pt reports 2 days of N/V/D pt reports sick contacts.

## 2015-07-18 NOTE — Discharge Instructions (Signed)
Please seek medical attention for any high fevers, chest pain, shortness of breath, change in behavior, persistent vomiting, bloody stool or any other new or concerning symptoms. ° ° °Shortness of Breath °Shortness of breath means you have trouble breathing. It could also mean that you have a medical problem. You should get immediate medical care for shortness of breath. °CAUSES  °· Not enough oxygen in the air such as with high altitudes or a smoke-filled room. °· Certain lung diseases, infections, or problems. °· Heart disease or conditions, such as angina or heart failure. °· Low red blood cells (anemia). °· Poor physical fitness, which can cause shortness of breath when you exercise. °· Chest or back injuries or stiffness. °· Being overweight. °· Smoking. °· Anxiety, which can make you feel like you are not getting enough air. °DIAGNOSIS  °Serious medical problems can often be found during your physical exam. Tests may also be done to determine why you are having shortness of breath. Tests may include: °· Chest X-rays. °· Lung function tests. °· Blood tests. °· An electrocardiogram (ECG). °· An ambulatory electrocardiogram. An ambulatory ECG records your heartbeat patterns over a 24-hour period. °· Exercise testing. °· A transthoracic echocardiogram (TTE). During echocardiography, sound waves are used to evaluate how blood flows through your heart. °· A transesophageal echocardiogram (TEE). °· Imaging scans. °Your health care provider may not be able to find a cause for your shortness of breath after your exam. In this case, it is important to have a follow-up exam with your health care provider as directed.  °TREATMENT  °Treatment for shortness of breath depends on the cause of your symptoms and can vary greatly. °HOME CARE INSTRUCTIONS  °· Do not smoke. Smoking is a common cause of shortness of breath. If you smoke, ask for help to quit. °· Avoid being around chemicals or things that may bother your breathing,  such as paint fumes and dust. °· Rest as needed. Slowly resume your usual activities. °· If medicines were prescribed, take them as directed for the full length of time directed. This includes oxygen and any inhaled medicines. °· Keep all follow-up appointments as directed by your health care provider. °SEEK MEDICAL CARE IF:  °· Your condition does not improve in the time expected. °· You have a hard time doing your normal activities even with rest. °· You have any new symptoms. °SEEK IMMEDIATE MEDICAL CARE IF:  °· Your shortness of breath gets worse. °· You feel light-headed, faint, or develop a cough not controlled with medicines. °· You start coughing up blood. °· You have pain with breathing. °· You have chest pain or pain in your arms, shoulders, or abdomen. °· You have a fever. °· You are unable to walk up stairs or exercise the way you normally do. °MAKE SURE YOU: °· Understand these instructions. °· Will watch your condition. °· Will get help right away if you are not doing well or get worse. °  °This information is not intended to replace advice given to you by your health care provider. Make sure you discuss any questions you have with your health care provider. °  °Document Released: 02/17/2001 Document Revised: 05/30/2013 Document Reviewed: 08/10/2011 °[employer] Interactive Patient Education ©2016 [employer] ° °

## 2015-07-18 NOTE — ED Provider Notes (Signed)
[facility] Emergency Department Provider Note   ____________________________________________  Time seen: ~1305  I have reviewed the triage vital signs and the nursing notes.   HISTORY  Chief Complaint Shortness of Breath and Emesis   History limited by: Not Limited   HPI Sandra Ellison is a 70 y.o. female who presents to the emergency department today because of concerns for continued shortness of breath and episodes of nausea and vomiting early this morning. She states that many members of her house have had the stomach bug that is been going around. She states she tried her best to not get it. She states this morning however she did have multiple episodes of nausea and vomiting. She additionally states that she has had shortness of breath. She does have a history of COPD and tried nebulized treatment at home without any great effect. She also has a history of congestive heart failure although has not appreciated any new swelling. No chest pain. No fevers.    Past Medical History  Diagnosis Date  . Chronic combined systolic and diastolic CHF (congestive heart failure) ([place])     a. 01/2012 Echo: EF 30-35%, mod conc LVH, sev inf HK, mildly dil LA, mild-mod MR, Trace TR, RVSP 40-17mmHg.    . Dilated cardiomyopathy ([place])     a. 02/2011 MV: EF 27%, anteroseptal, inf, inferolateral scar, no ischemia.  Marland Kitchen COPD (chronic obstructive pulmonary disease) ([place])   . Hx of colonic polyps   . Iron deficiency anemia     a. h/o transfusions, prev followed by heme/onc.  . Chronic kidney disease (CKD), stage II (mild)   . Pulmonary HTN ([place])   . Arthritis   . Allergy   . History of diverticulosis   . History of abnormal Pap smear     a. s/p vaginal hysterectomy  . GERD (gastroesophageal reflux disease)   . Hyperkalemia     a. 02/2015 in setting of entresto/AKI->entresto d/c'd.  . Carotid arterial disease ([place])     a. 02/2015 Carotid U/S: RICA A999333, LICA 123456.  .  Tobacco abuse   . Hypertension     Patient Active Problem List   Diagnosis Date Noted  . Foot swelling 06/15/2015  . Chronic combined systolic and diastolic CHF (congestive heart failure) ([place])   . Dilated cardiomyopathy ([place])   . Carotid arterial disease ([place])   . Tobacco abuse   . Diarrhea 03/17/2015  . Essential hypertension 03/17/2015  . Hyperkalemia 03/06/2015  . CKD (chronic kidney disease), stage IV ([place]) 03/06/2015  . Abnormal mammogram 02/23/2015  . Encounter for screening colonoscopy 01/26/2015  . Health care maintenance 08/12/2014  . Acute bronchitis 05/21/2014  . Chronic systolic CHF (congestive heart failure) ([place]) 05/21/2014  . Herpes zoster 02/01/2014  . Microcalcifications of the breast 01/24/2014  . Knee pain, bilateral 07/22/2013  . Hematuria 05/20/2013  . Pulmonary nodule 05/20/2013  . Insomnia 03/08/2013  . Left knee pain 03/08/2013  . Ulnar nerve injury 03/07/2013  . GERD (gastroesophageal reflux disease) 02/28/2013  . History of colonic polyps 02/28/2013  . Diverticulosis 02/28/2013  . Anemia 02/28/2013  . COPD (chronic obstructive pulmonary disease) ([place]) 02/28/2013  . Pulmonary hypertension ([place]) 02/28/2013  . Nonischemic cardiomyopathy ([place]) 02/26/2012  . Carotid bruit 02/26/2012  . SOB (shortness of breath) 03/03/2011  . Smoking 03/03/2011  . Hyperlipidemia 03/03/2011  . CAD (coronary artery disease) 03/03/2011    Past Surgical History  Procedure Laterality Date  . Tonsillectomy  age 29  . Vaginal hysterectomy  1981    secondary to abnormal pap smear  . Orif distal radius fracture  2011    right  . Colonoscopy  10/2010    Current Outpatient Rx  Name  Route  Sig  Dispense  Refill  . albuterol (PROVENTIL HFA;VENTOLIN HFA) 108 (90 BASE) MCG/ACT inhaler   Inhalation   Inhale 2 puffs into the lungs every 6 (six) hours as needed for wheezing or shortness of breath.   1 Inhaler   0   . albuterol (PROVENTIL) (2.5 MG/3ML) 0.083% nebulizer  solution   Nebulization   Take 3 mLs (2.5 mg total) by nebulization every 6 (six) hours as needed for wheezing or shortness of breath.   75 mL   0   . albuterol (PROVENTIL) (2.5 MG/3ML) 0.083% nebulizer solution      USE ONE TREATMENT EVERY 6 HOURS AS NEEDED   180 mL   1   . aspirin EC 81 MG tablet   Oral   Take 81 mg by mouth daily.         Marland Kitchen atorvastatin (LIPITOR) 40 MG tablet   Oral   Take 1 tablet (40 mg total) by mouth daily.   30 tablet   11   . carvedilol (COREG) 3.125 MG tablet      TAKE 1 TABLET (3.125 MG TOTAL) BY MOUTH 2 (TWO) TIMES DAILY WITH A MEAL.   60 tablet   1   . Fe Fum-FePoly-Vit C-Vit B3 (INTEGRA) 62.5-62.5-40-3 MG CAPS      One per day   30 capsule   2   . gabapentin (NEURONTIN) 100 MG capsule      TAKE 1 CAPSULE (100 MG TOTAL) BY MOUTH 3 (THREE) TIMES DAILY AS NEEDED.   90 capsule   1   . gabapentin (NEURONTIN) 300 MG capsule      TAKE ONE CAPSULE BY MOUTH 3 TIMES A DAY AS NEEDED   90 capsule   0   . hydrALAZINE (APRESOLINE) 25 MG tablet   Oral   Take 25 mg by mouth 3 (three) times daily.          . Nebulizer MISC      USE AS DIRECTED   1 each   0   . pantoprazole (PROTONIX) 40 MG tablet   Oral   Take 1 tablet (40 mg total) by mouth daily.   30 tablet   3   . polyethylene glycol (MIRALAX / GLYCOLAX) packet   Oral   Take 17 g by mouth daily as needed for mild constipation.         . predniSONE (DELTASONE) 20 MG tablet   Oral   Take 3 tablets (60 mg total) by mouth daily. Patient not taking: Reported on 06/12/2015   15 tablet   0   . tiotropium (SPIRIVA HANDIHALER) 18 MCG inhalation capsule      One inhalation daily   30 capsule   5   . tiotropium (SPIRIVA) 18 MCG inhalation capsule   Inhalation   Place 18 mcg into inhaler and inhale daily.           Allergies Ranitidine hcl and Entresto  Family History  Problem Relation Age of Onset  . Arthritis Mother   . Alcohol abuse Father   . Arthritis Father    . Cancer Father     throat/spine  . Breast cancer Neg Hx     Social History Social History  Substance Use Topics  . Smoking status: Current  Every Day Smoker -- 0.25 packs/day for 42 years    Types: Cigarettes  . Smokeless tobacco: Never Used     Comment: Quit early December 2015  . Alcohol Use: No    Review of Systems  Constitutional: Negative for fever. Cardiovascular: Negative for chest pain. Respiratory: Positive for shortness of breath. Gastrointestinal: Negative for abdominal pain. Positive for nausea and vomiting Neurological: Negative for headaches, focal weakness or numbness.   10-point ROS otherwise negative.  ____________________________________________   PHYSICAL EXAM:  VITAL SIGNS: ED Triage Vitals  Enc Vitals Group     BP 07/18/15 1049 129/107 mmHg     Pulse Rate 07/18/15 1049 99     Resp 07/18/15 1049 22     Temp 07/18/15 1049 97.8 F (36.6 C)     Temp Source 07/18/15 1049 Oral     SpO2 07/18/15 1049 96 %     Weight 07/18/15 1049 160 lb (72.576 kg)     Height 07/18/15 1049 5\' 5"  (1.651 m)   Constitutional: Alert and oriented. Well appearing and in no distress. Eyes: Conjunctivae are normal. PERRL. Normal extraocular movements. ENT   Head: Normocephalic and atraumatic.   Nose: No congestion/rhinnorhea.   Mouth/Throat: Mucous membranes are moist.   Neck: No stridor. Hematological/Lymphatic/Immunilogical: No cervical lymphadenopathy. Cardiovascular: Normal rate, regular rhythm.  No murmurs, rubs, or gallops. Respiratory: Normal respiratory effort without tachypnea nor retractions. Breath sounds are clear and equal bilaterally. No wheezes/rales/rhonchi. No crackles appreciated Gastrointestinal: Soft and nontender. No distention.  Genitourinary: Deferred Musculoskeletal: Normal range of motion in all extremities. No joint effusions.  No lower extremity tenderness nor edema. Neurologic:  Normal speech and language. No gross focal  neurologic deficits are appreciated.  Skin:  Skin is warm, dry and intact. No rash noted. Psychiatric: Mood and affect are normal. Speech and behavior are normal. Patient exhibits appropriate insight and judgment.  ____________________________________________    LABS (pertinent positives/negatives)  Labs Reviewed  COMPREHENSIVE METABOLIC PANEL - Abnormal; Notable for the following:    CO2 21 (*)    BUN 21 (*)    Creatinine, Ser 1.31 (*)    Calcium 8.8 (*)    ALT 11 (*)    Total Bilirubin 2.1 (*)    GFR calc non Af Amer 41 (*)    GFR calc Af Amer 47 (*)    All other components within normal limits  CBC - Abnormal; Notable for the following:    Hemoglobin 11.5 (*)    HCT 34.1 (*)    RDW 15.1 (*)    All other components within normal limits  LIPASE, BLOOD  URINALYSIS COMPLETEWITH MICROSCOPIC ([facility] ONLY)     ____________________________________________   EKG  I, Nance Pear, attending physician, personally viewed and interpreted this EKG  EKG Time: 1051 Rate: 94 Rhythm: normal sinus rhythm Axis: left axis deviation Intervals: qtc 497 QRS: narrow, LVH ST changes: no st elevation Impression: abnormal ekg  ____________________________________________    RADIOLOGY  CXR IMPRESSION: Underlying interstitial fibrotic type change. Evidence of pulmonary vascular congestion with probable mild interstitial edema superimposed on fibrotic type change. No airspace consolidation.   ____________________________________________   PROCEDURES  Procedure(s) performed: None  Critical Care performed: No  ____________________________________________   INITIAL IMPRESSION / ASSESSMENT AND PLAN / ED COURSE  Pertinent labs & imaging results that were available during my care of the patient were reviewed by me and considered in my medical decision making (see chart for details).  Patient states she felt better after DuoNeb treatment.  She also appeared to be breathing  much more comfortably. Will give patient steroids and advised patient to continue neb treatments.  ____________________________________________   FINAL CLINICAL IMPRESSION(S) / ED DIAGNOSES  Final diagnoses:  Shortness of breath     Nance Pear, MD 07/18/15 1625

## 2015-07-18 NOTE — ED Notes (Signed)
Pt reports shortness of breath and vomiting since lastnight; denies any abdominal pain. Reports frequent bowel movements, denies diarrhea.

## 2015-07-22 ENCOUNTER — Other Ambulatory Visit: Payer: Self-pay | Admitting: Internal Medicine

## 2015-07-23 ENCOUNTER — Ambulatory Visit (INDEPENDENT_AMBULATORY_CARE_PROVIDER_SITE_OTHER): Payer: Commercial Managed Care - HMO | Admitting: Internal Medicine

## 2015-07-23 ENCOUNTER — Encounter: Payer: Self-pay | Admitting: Internal Medicine

## 2015-07-23 VITALS — BP 138/80 | HR 84 | Temp 97.6°F | Resp 18 | Ht 65.0 in | Wt 151.4 lb

## 2015-07-23 DIAGNOSIS — I779 Disorder of arteries and arterioles, unspecified: Secondary | ICD-10-CM

## 2015-07-23 DIAGNOSIS — D649 Anemia, unspecified: Secondary | ICD-10-CM | POA: Diagnosis not present

## 2015-07-23 DIAGNOSIS — E785 Hyperlipidemia, unspecified: Secondary | ICD-10-CM

## 2015-07-23 DIAGNOSIS — R928 Other abnormal and inconclusive findings on diagnostic imaging of breast: Secondary | ICD-10-CM

## 2015-07-23 DIAGNOSIS — I429 Cardiomyopathy, unspecified: Secondary | ICD-10-CM

## 2015-07-23 DIAGNOSIS — J449 Chronic obstructive pulmonary disease, unspecified: Secondary | ICD-10-CM

## 2015-07-23 DIAGNOSIS — Z720 Tobacco use: Secondary | ICD-10-CM

## 2015-07-23 DIAGNOSIS — I739 Peripheral vascular disease, unspecified: Secondary | ICD-10-CM

## 2015-07-23 DIAGNOSIS — I10 Essential (primary) hypertension: Secondary | ICD-10-CM

## 2015-07-23 DIAGNOSIS — M7989 Other specified soft tissue disorders: Secondary | ICD-10-CM | POA: Diagnosis not present

## 2015-07-23 DIAGNOSIS — N184 Chronic kidney disease, stage 4 (severe): Secondary | ICD-10-CM

## 2015-07-23 DIAGNOSIS — I428 Other cardiomyopathies: Secondary | ICD-10-CM

## 2015-07-23 DIAGNOSIS — K219 Gastro-esophageal reflux disease without esophagitis: Secondary | ICD-10-CM

## 2015-07-23 LAB — CBC WITH DIFFERENTIAL/PLATELET
Basophils Absolute: 0 10*3/uL (ref 0.0–0.1)
Basophils Relative: 0 % (ref 0.0–3.0)
EOS ABS: 0 10*3/uL (ref 0.0–0.7)
Eosinophils Relative: 0 % (ref 0.0–5.0)
HCT: 33.1 % — ABNORMAL LOW (ref 36.0–46.0)
HEMOGLOBIN: 11 g/dL — AB (ref 12.0–15.0)
LYMPHS ABS: 0.7 10*3/uL (ref 0.7–4.0)
Lymphocytes Relative: 11.4 % — ABNORMAL LOW (ref 12.0–46.0)
MCHC: 33.1 g/dL (ref 30.0–36.0)
MCV: 88.4 fl (ref 78.0–100.0)
MONO ABS: 0.3 10*3/uL (ref 0.1–1.0)
Monocytes Relative: 4.5 % (ref 3.0–12.0)
NEUTROS PCT: 84.1 % — AB (ref 43.0–77.0)
Neutro Abs: 5.1 10*3/uL (ref 1.4–7.7)
Platelets: 293 10*3/uL (ref 150.0–400.0)
RBC: 3.74 Mil/uL — AB (ref 3.87–5.11)
RDW: 16.5 % — ABNORMAL HIGH (ref 11.5–15.5)
WBC: 6 10*3/uL (ref 4.0–10.5)

## 2015-07-23 LAB — BASIC METABOLIC PANEL
BUN: 24 mg/dL — ABNORMAL HIGH (ref 6–23)
CALCIUM: 8.8 mg/dL (ref 8.4–10.5)
CHLORIDE: 106 meq/L (ref 96–112)
CO2: 23 mEq/L (ref 19–32)
CREATININE: 1.33 mg/dL — AB (ref 0.40–1.20)
GFR: 42.02 mL/min — AB (ref >60.00)
Glucose, Bld: 113 mg/dL — ABNORMAL HIGH (ref 70–99)
Potassium: 4.2 mEq/L (ref 3.5–5.1)
Sodium: 139 mEq/L (ref 135–145)

## 2015-07-23 LAB — HEPATIC FUNCTION PANEL
ALBUMIN: 3.9 g/dL (ref 3.5–5.2)
ALK PHOS: 74 U/L (ref 39–117)
ALT: 14 U/L (ref 0–35)
AST: 15 U/L (ref 0–37)
Bilirubin, Direct: 0.2 mg/dL (ref 0.0–0.3)
TOTAL PROTEIN: 6.1 g/dL (ref 6.0–8.3)
Total Bilirubin: 0.7 mg/dL (ref 0.2–1.2)

## 2015-07-23 LAB — LIPID PANEL
CHOL/HDL RATIO: 3
CHOLESTEROL: 115 mg/dL (ref 0–200)
HDL: 38.4 mg/dL — AB (ref >39.00)
LDL Cholesterol: 57 mg/dL (ref 0–99)
NonHDL: 77.06
TRIGLYCERIDES: 102 mg/dL (ref 0.0–149.0)
VLDL: 20.4 mg/dL (ref 0.0–40.0)

## 2015-07-23 LAB — TSH: TSH: 0.7 u[IU]/mL (ref 0.35–4.50)

## 2015-07-23 LAB — VITAMIN B12: VITAMIN B 12: 207 pg/mL — AB (ref 211–911)

## 2015-07-23 LAB — FERRITIN: FERRITIN: 132.4 ng/mL (ref 10.0–291.0)

## 2015-07-23 MED ORDER — ALBUTEROL SULFATE HFA 108 (90 BASE) MCG/ACT IN AERS: 2.0000 | INHALATION_SPRAY | Freq: Four times a day (QID) | RESPIRATORY_TRACT | Status: DC | PRN

## 2015-07-23 NOTE — Progress Notes (Signed)
Pre-visit discussion using our clinic review tool. No additional management support is needed unless otherwise documented below in the visit note.  

## 2015-07-23 NOTE — Progress Notes (Signed)
Patient ID: Sandra Ellison, female   DOB: 1945/07/04, 70 y.o.   MRN: [id number]   Subjective:    Patient ID: Sandra Ellison, female    DOB: 09/09/1945, 70 y.o.   MRN: [id number]  HPI  Patient with past history of documented CHF, cardiomyopathy, COPD, CKD, tobacco abuse and hypertension.  She comes in today for an ER follow up.  She was seen on 07/18/15 with sob and emesis.  See ER note for details.  Was given DuoNeb treatment and per report, breathing improved.  Was given prednisone and instructed to continue nebs.  She is eating now.  No nausea or vomiting.  Some constipation.  Not taking miralax.  Breathing better.  Due to see Dr Rockey Situ tomorrow.  No abdominal pain or cramping.  Blood pressure has been better.  Overall feels better.     Past Medical History  Diagnosis Date  . Chronic combined systolic and diastolic CHF (congestive heart failure) ([place])     a. 01/2012 Echo: EF 30-35%, mod conc LVH, sev inf HK, mildly dil LA, mild-mod MR, Trace TR, RVSP 40-50mmHg.    . Dilated cardiomyopathy ([place])     a. 02/2011 MV: EF 27%, anteroseptal, inf, inferolateral scar, no ischemia.  Marland Kitchen COPD (chronic obstructive pulmonary disease) ([place])   . Hx of colonic polyps   . Iron deficiency anemia     a. h/o transfusions, prev followed by heme/onc.  . Chronic kidney disease (CKD), stage II (mild)   . Pulmonary HTN ([place])   . Arthritis   . Allergy   . History of diverticulosis   . History of abnormal Pap smear     a. s/p vaginal hysterectomy  . GERD (gastroesophageal reflux disease)   . Hyperkalemia     a. 02/2015 in setting of entresto/AKI->entresto d/c'd.  . Carotid arterial disease ([place])     a. 02/2015 Carotid U/S: RICA A999333, LICA 123456.  . Tobacco abuse   . Hypertension    Past Surgical History  Procedure Laterality Date  . Tonsillectomy  age 63  . Vaginal hysterectomy  1981    secondary to abnormal pap smear  . Orif distal radius fracture  2011    right  . Colonoscopy  10/2010   Family  History  Problem Relation Age of Onset  . Arthritis Mother   . Alcohol abuse Father   . Arthritis Father   . Cancer Father     throat/spine  . Breast cancer Neg Hx    Social History   Social History  . Marital Status: Single    Spouse Name: N/A  . Number of Children: 1  . Years of Education: N/A   Social History Main Topics  . Smoking status: Current Every Day Smoker -- 0.25 packs/day for 42 years    Types: Cigarettes  . Smokeless tobacco: Never Used     Comment: Down to 3 cigarettes per day  . Alcohol Use: No  . Drug Use: No  . Sexual Activity: Not Asked   Other Topics Concern  . None   Social History Narrative    Outpatient Encounter Prescriptions as of 07/23/2015  Medication Sig  . albuterol (PROVENTIL HFA;VENTOLIN HFA) 108 (90 Base) MCG/ACT inhaler Inhale 2 puffs into the lungs every 6 (six) hours as needed for wheezing or shortness of breath.  Marland Kitchen aspirin EC 81 MG tablet Take 81 mg by mouth daily.  Marland Kitchen atorvastatin (LIPITOR) 40 MG tablet Take 1 tablet (40 mg total) by mouth daily.  Marland Kitchen  carvedilol (COREG) 3.125 MG tablet TAKE 1 TABLET (3.125 MG TOTAL) BY MOUTH 2 (TWO) TIMES DAILY WITH A MEAL.  Marland Kitchen Fe Fum-FePoly-Vit C-Vit B3 (INTEGRA) 62.5-62.5-40-3 MG CAPS One per day  . gabapentin (NEURONTIN) 100 MG capsule TAKE 1 CAPSULE (100 MG TOTAL) BY MOUTH 3 (THREE) TIMES DAILY AS NEEDED.  Marland Kitchen gabapentin (NEURONTIN) 300 MG capsule TAKE ONE CAPSULE BY MOUTH 3 TIMES A DAY AS NEEDED  . hydrALAZINE (APRESOLINE) 25 MG tablet Take 25 mg by mouth 3 (three) times daily.   . Nebulizer MISC USE AS DIRECTED  . polyethylene glycol (MIRALAX / GLYCOLAX) packet Take 17 g by mouth daily as needed for mild constipation.  Marland Kitchen tiotropium (SPIRIVA HANDIHALER) 18 MCG inhalation capsule One inhalation daily  . [DISCONTINUED] albuterol (PROVENTIL HFA;VENTOLIN HFA) 108 (90 BASE) MCG/ACT inhaler Inhale 2 puffs into the lungs every 6 (six) hours as needed for wheezing or shortness of breath.  . [DISCONTINUED]  albuterol (PROVENTIL) (2.5 MG/3ML) 0.083% nebulizer solution Take 3 mLs (2.5 mg total) by nebulization every 6 (six) hours as needed for wheezing or shortness of breath.  . [DISCONTINUED] albuterol (PROVENTIL) (2.5 MG/3ML) 0.083% nebulizer solution USE ONE TREATMENT EVERY 6 HOURS AS NEEDED  . [DISCONTINUED] Furosemide (LASIX PO) Take by mouth daily as needed.  . [DISCONTINUED] pantoprazole (PROTONIX) 40 MG tablet Take 1 tablet (40 mg total) by mouth daily.  . [DISCONTINUED] predniSONE (DELTASONE) 20 MG tablet Take 2 tablets (40 mg total) by mouth daily.  . [DISCONTINUED] tiotropium (SPIRIVA) 18 MCG inhalation capsule Place 18 mcg into inhaler and inhale daily.   No facility-administered encounter medications on file as of 07/23/2015.    Review of Systems  Constitutional: Negative for fever.       Back to eating regularly now.   HENT: Negative for congestion and sinus pressure.   Respiratory: Negative for cough and chest tightness.        Previous sob.  Has improved.    Cardiovascular: Negative for chest pain and palpitations.       Some ankle swelling.  Better today.   Gastrointestinal: Positive for constipation. Negative for nausea, vomiting and abdominal pain.  Genitourinary: Negative for dysuria and difficulty urinating.  Musculoskeletal: Negative for back pain and joint swelling.  Skin: Negative for color change and rash.  Neurological: Negative for dizziness, light-headedness and headaches.  Psychiatric/Behavioral: Negative for dysphoric mood and agitation.       Objective:    Physical Exam  Constitutional: She appears well-developed and well-nourished. No distress.  HENT:  Nose: Nose normal.  Mouth/Throat: Oropharynx is clear and moist.  Eyes: Conjunctivae are normal. Right eye exhibits no discharge. Left eye exhibits no discharge.  Neck: Neck supple. No thyromegaly present.  Cardiovascular: Normal rate and regular rhythm.   Pulmonary/Chest: Breath sounds normal. No  respiratory distress. She has no wheezes.  Abdominal: Soft. Bowel sounds are normal. There is no tenderness.  Musculoskeletal: She exhibits no edema or tenderness.  Lymphadenopathy:    She has no cervical adenopathy.  Skin: No rash noted. No erythema.  Psychiatric: She has a normal mood and affect. Her behavior is normal.    BP 138/80 mmHg  Pulse 84  Temp(Src) 97.6 F (36.4 C) (Oral)  Resp 18  Ht 5\' 5"  (1.651 m)  Wt 151 lb 6 oz (68.663 kg)  BMI 25.19 kg/m2  SpO2 97% Wt Readings from Last 3 Encounters:  07/24/15 153 lb 8 oz (69.627 kg)  07/23/15 151 lb 6 oz (68.663 kg)  07/18/15 160 lb (  72.576 kg)     Lab Results  Component Value Date   WBC 6.0 07/23/2015   HGB 11.0* 07/23/2015   HCT 33.1* 07/23/2015   PLT 293.0 07/23/2015   GLUCOSE 113* 07/23/2015   CHOL 115 07/23/2015   TRIG 102.0 07/23/2015   HDL 38.40* 07/23/2015   LDLCALC 57 07/23/2015   ALT 14 07/23/2015   AST 15 07/23/2015   NA 139 07/23/2015   K 4.2 07/23/2015   CL 106 07/23/2015   CREATININE 1.33* 07/23/2015   BUN 24* 07/23/2015   CO2 23 07/23/2015   TSH 0.70 07/23/2015   INR 1.0 02/05/2012    Dg Chest 2 View  07/18/2015  CLINICAL DATA:  Shortness of breath for 1 day EXAM: CHEST  2 VIEW COMPARISON:  May 29, 2015 chest radiograph ; June 19, 2015 chest CT FINDINGS: There is generalized interstitial prominence consistent with underlying fibrotic type change. The interstitium appears slightly more prominent currently compared to recent studies, suggesting that there may be mild superimposed interstitial edema. There is no airspace consolidation. Heart is borderline enlarged. There is mild pulmonary venous hypertension. No demonstrable adenopathy. No bone lesions apparent. IMPRESSION: Underlying interstitial fibrotic type change. Evidence of pulmonary vascular congestion with probable mild interstitial edema superimposed on fibrotic type change. No airspace consolidation. Electronically Signed   By: Lowella Grip III M.D.   On: 07/18/2015 11:46       Assessment & Plan:   Problem List Items Addressed This Visit    Abnormal mammogram    Saw Dr Bary Castilla.  Mammogram as outlined.  Recommended f/u mammogram in 6 months.  Last 01/2015.  Due f/u.  Ordered.        Relevant Orders   MM [facility]   Anemia    History of anemia which was felt to be related to iron deficiency and chronic renal disease.  Recheck cbc, iron studies and B12.        Carotid arterial disease ([place])    Carotid ultrasound results reviewed - in overview.  Recommended f/u yearly.  Last 02/2015.       CKD (chronic kidney disease), stage IV ([facility])    Recent creatinine has improved some.  Cr 1.3.  Recheck today.        COPD (chronic obstructive pulmonary disease) ([place])    Recently treated with prednisone.  Breathing better.  Continue inhalers.  Follow.  Recommended smoking cessation.       Relevant Medications   albuterol (PROVENTIL HFA;VENTOLIN HFA) 108 (90 Base) MCG/ACT inhaler   Essential hypertension - Primary    Blood pressure doing better.  Continue current medication regimen.  Follow metabolic panel.  Cr just checked 1.3.  Recheck today.       Foot swelling   GERD (gastroesophageal reflux disease)    Controlled on protonix.        Hyperlipidemia    On lipitor.  Low cholesterol diet and exercise.  Follow lipid panel and liver function tests.        Nonischemic cardiomyopathy ([place])    Followed by cardiology.  Has f/u planned for tomorrow.  Currently breathing better.       Tobacco abuse    Discussed the need to quit.  Follow.           Einar Pheasant, MD

## 2015-07-24 ENCOUNTER — Encounter: Payer: Self-pay | Admitting: Cardiovascular Disease

## 2015-07-24 ENCOUNTER — Ambulatory Visit (INDEPENDENT_AMBULATORY_CARE_PROVIDER_SITE_OTHER): Payer: Commercial Managed Care - HMO | Admitting: Cardiovascular Disease

## 2015-07-24 ENCOUNTER — Telehealth: Payer: Self-pay | Admitting: Cardiovascular Disease

## 2015-07-24 VITALS — BP 100/70 | HR 72 | Ht 65.0 in | Wt 153.5 lb

## 2015-07-24 DIAGNOSIS — I255 Ischemic cardiomyopathy: Secondary | ICD-10-CM | POA: Diagnosis not present

## 2015-07-24 DIAGNOSIS — R0989 Other specified symptoms and signs involving the circulatory and respiratory systems: Secondary | ICD-10-CM

## 2015-07-24 DIAGNOSIS — I2510 Atherosclerotic heart disease of native coronary artery without angina pectoris: Secondary | ICD-10-CM | POA: Diagnosis not present

## 2015-07-24 DIAGNOSIS — Z720 Tobacco use: Secondary | ICD-10-CM | POA: Diagnosis not present

## 2015-07-24 DIAGNOSIS — J449 Chronic obstructive pulmonary disease, unspecified: Secondary | ICD-10-CM

## 2015-07-24 DIAGNOSIS — E785 Hyperlipidemia, unspecified: Secondary | ICD-10-CM

## 2015-07-24 NOTE — Patient Instructions (Addendum)
No medication changes were made.  We will schedule a cardiac cath   Please call us if you have new issues that need to be addressed before your next appt.  Your physician wants you to follow-up in: 1 month   Date & time: ______________________  [facility] Cardiac Cath Instructions   You are scheduled for a Cardiac Cath on:___Monday, Feb 27_____  Please arrive at 10:30 am on the day of your procedure  You will need to pre-register prior to the day of your procedure.  Enter through the [employer] at [facility].  Registration is the first desk on your right.  Please take the procedure order we have given you in order to be registered appropriately  Do not eat/drink anything after midnight  Someone will need to drive you home  It is recommended someone be with you for the first 24 hours after your procedure  Wear clothes that are easy to get on/off and wear slip on shoes if possible  Medications bring a current list of all medications with you  _X_ You may take all of your medications the morning of your procedure with enough water to swallow safely  Day of your procedure: Arrive at the [place] entrance.  Free valet service is available.  After entering the [place] please check-in at the registration desk (1st desk on your right) to receive your armband. After receiving your armband someone will escort you to the cardiac cath/special procedures waiting area.  The usual length of stay after your procedure is about 2 to 3 hours.  This can vary.  If you have any questions, please call our office at [phone], or you may call the cardiac cath lab at [facility] directly at [phone]  Angiogram An angiogram, also called angiography, is a procedure used to look at the blood vessels. In this procedure, dye is injected through a long, thin tube (catheter) into an artery. X-rays are then taken. The X-rays will show if there is a blockage or problem in a blood vessel.  LET [facility] CARE  PROVIDER KNOW ABOUT:  Any allergies you have, including allergies to shellfish or contrast dye.   All medicines you are taking, including vitamins, herbs, eye drops, creams, and over-the-counter medicines.   Previous problems you or members of your family have had with the use of anesthetics.   Any blood disorders you have.   Previous surgeries you have had.  Any previous kidney problems or failure you have had.  Medical conditions you have.   Possibility of pregnancy, if this applies. RISKS AND COMPLICATIONS Generally, an angiogram is a safe procedure. However, as with any procedure, problems can occur. Possible problems include:  Injury to the blood vessels, including rupture or bleeding.  Infection or bruising at the catheter site.  Allergic reaction to the dye or contrast used.  Kidney damage from the dye or contrast used.  Blood clots that can lead to a stroke or heart attack. BEFORE THE PROCEDURE  Do not eat or drink after midnight on the night before the procedure, or as directed by your health care provider.   Ask your health care provider if you may drink enough water to take any needed medicines the morning of the procedure.  PROCEDURE  You may be given a medicine to help you relax (sedative) before and during the procedure. This medicine is given through an IV access tube that is inserted into one of your veins.   The area where the catheter will  be inserted will be washed and shaved. This is usually done in the groin but may be done in the fold of your arm (near your elbow) or in the wrist.  A medicine will be given to numb the area where the catheter will be inserted (local anesthetic).  The catheter will be inserted with a guide wire into an artery. The catheter is guided by using a type of X-ray (fluoroscopy) to the blood vessel being examined.   Dye is then injected into the catheter, and X-rays are taken. The dye helps to show where any narrowing  or blockages are located.  AFTER THE PROCEDURE   If the procedure is done through the leg, you will be kept in bed lying flat for several hours. You will be instructed to not bend or cross your legs.  The insertion site will be checked frequently.  The pulse in your feet or wrist will be checked frequently.  Additional blood tests, X-rays, and electrocardiography may be done.   You may need to stay in the hospital overnight for observation.    This information is not intended to replace advice given to you by your health care provider. Make sure you discuss any questions you have with your health care provider.   Document Released: 03/04/2005 Document Revised: 06/15/2014 Document Reviewed: 10/26/2012 Elsevier Interactive Patient Education 2016 [place] After Refer to this sheet in the next few weeks. These instructions provide you with information about caring for yourself after your procedure. Your health care provider may also give you more specific instructions. Your treatment has been planned according to current medical practices, but problems sometimes occur. Call your health care provider if you have any problems or questions after your procedure. WHAT TO EXPECT AFTER THE PROCEDURE After your procedure, it is typical to have the following:  Bruising at the catheter insertion site that usually fades within 1-2 weeks.  Blood collecting in the tissue (hematoma) that may be painful to the touch. It should usually decrease in size and tenderness within 1-2 weeks. HOME CARE INSTRUCTIONS  Take medicines only as directed by your health care provider.  You may shower 24-48 hours after the procedure or as directed by your health care provider. Remove the bandage (dressing) and gently wash the site with plain soap and water. Pat the area dry with a clean towel. Do not rub the site, because this may cause bleeding.  Do not take baths, swim, or use a hot tub until  your health care provider approves.  Check your insertion site every day for redness, swelling, or drainage.  Do not apply powder or lotion to the site.  Do not lift over 10 lb (4.5 kg) for 5 days after your procedure or as directed by your health care provider.  Ask your health care provider when it is okay to:  Return to work or school.  Resume usual physical activities or sports.  Resume sexual activity.  Do not drive home if you are discharged the same day as the procedure. Have someone else drive you.  You may drive 24 hours after the procedure unless otherwise instructed by your health care provider.  Do not operate machinery or power tools for 24 hours after the procedure or as directed by your health care provider.  If your procedure was done as an outpatient procedure, which means that you went home the same day as your procedure, a responsible adult should be with you for the first 24 hours  after you arrive home.  Keep all follow-up visits as directed by your health care provider. This is important. SEEK MEDICAL CARE IF:  You have a fever.  You have chills.  You have increased bleeding from the catheter insertion site. Hold pressure on the site. SEEK IMMEDIATE MEDICAL CARE IF:  You have unusual pain at the catheter insertion site.  You have redness, warmth, or swelling at the catheter insertion site.  You have drainage (other than a small amount of blood on the dressing) from the catheter insertion site.  The catheter insertion site is bleeding, and the bleeding does not stop after 30 minutes of holding steady pressure on the site.  The area near or just beyond the catheter insertion site becomes pale, cool, tingly, or numb.   This information is not intended to replace advice given to you by your health care provider. Make sure you discuss any questions you have with your health care provider.   Document Released: 12/11/2004 Document Revised: 06/15/2014  Document Reviewed: 10/26/2012 Elsevier Interactive Patient Education [employer].

## 2015-07-24 NOTE — Assessment & Plan Note (Signed)
We have encouraged her to continue to work on weaning her cigarettes and smoking cessation. She will continue to work on this and does not want any assistance with chantix.  

## 2015-07-24 NOTE — Assessment & Plan Note (Signed)
We have stressed the importance of smoking cessation Cholesterol at goal Would repeat carotid ultrasound on an annual basis

## 2015-07-24 NOTE — Assessment & Plan Note (Signed)
Significant coronary artery disease seen on recent CT scan of the chest. In light of her cardiomyopathy, recent decline in her ejection fraction on echocardiogram, we have recommended cardiac catheterization. She has "spells" concerning for angina with chest discomfort, acute shortness of breath. Catheterization will be scheduled on August 05 2015.

## 2015-07-24 NOTE — Telephone Encounter (Signed)
°*  STAT* If patient is at the pharmacy, call can be transferred to refill team.   1. Which medications need to be refilled? (please list name of each medication and dose if known) Lasik   2. Which pharmacy/location (including street and city if local pharmacy) is [facility] [address]  3. Do they need a 30 day or 90 day supply? 90 day

## 2015-07-24 NOTE — Assessment & Plan Note (Signed)
Recent episode of bronchitis requiring steroids and antibiotics. We have recommended smoking cessation

## 2015-07-24 NOTE — Assessment & Plan Note (Addendum)
Images reviewed with the patient in detail in the office today. Extensive coronary disease noted. Implication discussed with Sandra Ellison. Cardiac catheterization has been scheduled given the decline in ejection fraction, anginal symptoms. Risk and benefit of the procedure was discussed with the patient including risk of stroke and heart attack.

## 2015-07-24 NOTE — Progress Notes (Signed)
Patient ID: Sandra Ellison, female    DOB: 09/16/1945, 70 y.o.   MRN: [id number]  HPI Comments: 70 year old woman with a history of peptic ulcer disease,  cardiomyopathy with history of systolic and diastolic CHF, COPD with long smoking history who continues to smoke, pulmonary hypertension seen on echocardiogram in March 2012 who presented to [facility] with increasing shortness of breath on February 11, 2011, chronic renal insufficiency, stage II, severe iron deficiency anemia, who presents for routine followup of her systolic heart failure  On her last clinic visit, she was started on entresto but developed hyperkalemia and this was held, potassium up to 6.3 in September 2016 She was changed to losartan but again developed hyperkalemia, potassium up to 6.0 in October 2016 She was changed to BiDil 3 times a day but reported having severe headaches Changed to hydralazine and is so far tolerating this  Recent hospital records reviewed Seen in the emergency room twice, once in 05/29/2015 for shortness of breath with cough felt to be from COPD exacerbation, given a Z-Pak and steroids Second trip to the ER 07/18/2015 for nausea vomiting felt to be a GI bug  Recent CT scan of the chest reviewed with her in detail showing severe three-vessel coronary artery disease, moderate plaquing in her aorta.  Last echocardiogram December 2015 showing ejection fraction 20-25% which was down from 30-35% in August 2013  On today's visit, she has baseline shortness of breath, reports her weight is relatively stable Has to stop and rest, recover when she has breathing spells Previously has had chest pain symptoms with acute shortness of breath. She calls these her "spells"  EKG from 07/18/2015 showing ST and T wave abnormality anterolateral leads   other past medical history reviewed Echo done in the hospital 05/17/2014 was reviewed with her showing ejection fraction 20-25%, mildly elevated right trigger systolic  pressures  Previous hospital admission in 2012 she was found to have CHF, moderate bilateral pleural effusions with BNP of 26,000. Underlying anemia was a significant component of her presentation. Previously received procrit every other week, bone marrow biopsy, received packed red blood cells x2, iron transfusion x2,  followed by Dr. Ma Hillock.  Echocardiogram in the hospital showed ejection fraction 30-35%, mild to moderate right ventricular systolic pressure estimated at 40-50 mmHg  Echocardiogram performed February 11, 2011 shows ejection fraction less than 25% with severe global hypokinesis, apical akinesis, normal right ventricular systolic function , mild to moderate mitral valve regurgitation, normal right ventricular systolic pressures  Stress test done in the hospital on February 13 2011 shows no significant ischemia. She has a dilated cardiomyopathy with ejection fraction 27%, old scar in the anteroseptal wall, inferior and inferolateral wall. This was a pharmacologic study         Allergies  Allergen Reactions  . Ranitidine Hcl Anaphylaxis and Swelling  . Entresto [Sacubitril-Valsartan] Other (See Comments)    Potassium issues    Outpatient Encounter Prescriptions as of 07/24/2015  Medication Sig  . albuterol (PROVENTIL HFA;VENTOLIN HFA) 108 (90 Base) MCG/ACT inhaler Inhale 2 puffs into the lungs every 6 (six) hours as needed for wheezing or shortness of breath.  Marland Kitchen aspirin EC 81 MG tablet Take 81 mg by mouth daily.  Marland Kitchen atorvastatin (LIPITOR) 40 MG tablet Take 1 tablet (40 mg total) by mouth daily.  . carvedilol (COREG) 3.125 MG tablet TAKE 1 TABLET (3.125 MG TOTAL) BY MOUTH 2 (TWO) TIMES DAILY WITH A MEAL.  Marland Kitchen Fe Fum-FePoly-Vit C-Vit B3 (INTEGRA) 62.5-62.5-40-3 MG CAPS  One per day  . Furosemide (LASIX PO) Take by mouth daily as needed.  . gabapentin (NEURONTIN) 100 MG capsule TAKE 1 CAPSULE (100 MG TOTAL) BY MOUTH 3 (THREE) TIMES DAILY AS NEEDED.  Marland Kitchen gabapentin (NEURONTIN) 300  MG capsule TAKE ONE CAPSULE BY MOUTH 3 TIMES A DAY AS NEEDED  . hydrALAZINE (APRESOLINE) 25 MG tablet Take 25 mg by mouth 3 (three) times daily.   . Nebulizer MISC USE AS DIRECTED  . pantoprazole (PROTONIX) 40 MG tablet Take 1 tablet (40 mg total) by mouth daily.  . polyethylene glycol (MIRALAX / GLYCOLAX) packet Take 17 g by mouth daily as needed for mild constipation.  Marland Kitchen tiotropium (SPIRIVA HANDIHALER) 18 MCG inhalation capsule One inhalation daily  . [DISCONTINUED] albuterol (PROVENTIL HFA;VENTOLIN HFA) 108 (90 BASE) MCG/ACT inhaler Inhale 2 puffs into the lungs every 6 (six) hours as needed for wheezing or shortness of breath.  . [DISCONTINUED] albuterol (PROVENTIL) (2.5 MG/3ML) 0.083% nebulizer solution Take 3 mLs (2.5 mg total) by nebulization every 6 (six) hours as needed for wheezing or shortness of breath.  . [DISCONTINUED] albuterol (PROVENTIL) (2.5 MG/3ML) 0.083% nebulizer solution USE ONE TREATMENT EVERY 6 HOURS AS NEEDED  . [DISCONTINUED] carvedilol (COREG) 3.125 MG tablet TAKE 1 TABLET (3.125 MG TOTAL) BY MOUTH 2 (TWO) TIMES DAILY WITH A MEAL.  . [DISCONTINUED] predniSONE (DELTASONE) 20 MG tablet Take 2 tablets (40 mg total) by mouth daily.  . [DISCONTINUED] tiotropium (SPIRIVA) 18 MCG inhalation capsule Place 18 mcg into inhaler and inhale daily.   No facility-administered encounter medications on file as of 07/24/2015.    Past Medical History  Diagnosis Date  . Chronic combined systolic and diastolic CHF (congestive heart failure) ([place])     a. 01/2012 Echo: EF 30-35%, mod conc LVH, sev inf HK, mildly dil LA, mild-mod MR, Trace TR, RVSP 40-34mHg.    . Dilated cardiomyopathy ([place]     a. 02/2011 MV: EF 27%, anteroseptal, inf, inferolateral scar, no ischemia.  .Marland KitchenCOPD (chronic obstructive pulmonary disease) ([place]   . Hx of colonic polyps   . Iron deficiency anemia     a. h/o transfusions, prev followed by heme/onc.  . Chronic kidney disease (CKD), stage II (mild)   . Pulmonary  HTN ([place]   . Arthritis   . Allergy   . History of diverticulosis   . History of abnormal Pap smear     a. s/p vaginal hysterectomy  . GERD (gastroesophageal reflux disease)   . Hyperkalemia     a. 02/2015 in setting of entresto/AKI->entresto d/c'd.  . Carotid arterial disease ([place]     a. 02/2015 Carotid U/S: RICA 656-31% LICA 449-70%  . Tobacco abuse   . Hypertension     Past Surgical History  Procedure Laterality Date  . Tonsillectomy  age 89 . Vaginal hysterectomy  1981    secondary to abnormal pap smear  . Orif distal radius fracture  2011    right  . Colonoscopy  10/2010    Social History  reports that she has been smoking Cigarettes.  She has a 10.5 pack-year smoking history. She has never used smokeless tobacco. She reports that she does not drink alcohol or use illicit drugs.  Family History family history includes Alcohol abuse in her father; Arthritis in her father and mother; Cancer in her father. There is no history of Breast cancer.   Review of Systems  Constitutional: Negative.   Eyes: Negative.   Respiratory: Positive for shortness of breath.  Cardiovascular: Positive for chest pain.  Gastrointestinal: Negative.   Musculoskeletal: Positive for arthralgias.  Neurological: Negative.   Hematological: Negative.   Psychiatric/Behavioral: Positive for sleep disturbance.  All other systems reviewed and are negative.   BP 100/70 mmHg  Pulse 72  Ht _0  (1.651 m)  Wt 153 lb 8 oz (69.627 kg)  BMI 25.54 kg/m2  Physical Exam  Constitutional: She is oriented to person, place, and time. She appears well-developed and well-nourished.  HENT:  Head: Normocephalic.  Nose: Nose normal.  Mouth/Throat: Oropharynx is clear and moist.  Eyes: Conjunctivae are normal. Pupils are equal, round, and reactive to light.  Neck: Normal range of motion. Neck supple. No JVD present.  Cardiovascular: Normal rate, regular rhythm, S1 normal, S2 normal, normal heart sounds and  intact distal pulses.  Exam reveals no gallop and no friction rub.   No murmur heard. Pulmonary/Chest: Effort normal. No respiratory distress. She has decreased breath sounds. She exhibits no tenderness.  Abdominal: Soft. Bowel sounds are normal. She exhibits no distension. There is no tenderness.  Musculoskeletal: Normal range of motion. She exhibits no edema or tenderness.  Lymphadenopathy:    She has no cervical adenopathy.  Neurological: She is alert and oriented to person, place, and time. Coordination normal.  Skin: Skin is warm and dry. No rash noted. No erythema.  Psychiatric: She has a normal mood and affect. Her behavior is normal. Judgment and thought content normal.    Assessment and Plan  Nursing note and vitals reviewed.

## 2015-07-24 NOTE — Telephone Encounter (Signed)
What dose of Furosemide does she need to be on? I see in the past she took 20 mg as needed. Please advise.

## 2015-07-24 NOTE — Assessment & Plan Note (Signed)
Cholesterol is at goal on the current lipid regimen. No changes to the medications were made.    Total encounter time more than 25 minutes  Greater than 50% was spent in counseling and coordination of care with the patient  

## 2015-07-25 ENCOUNTER — Telehealth: Payer: Self-pay | Admitting: Internal Medicine

## 2015-07-25 ENCOUNTER — Telehealth: Payer: Self-pay | Admitting: *Deleted

## 2015-07-25 NOTE — Telephone Encounter (Signed)
The patient left a voicemail asking if she need to keep her appointment with Dr. Nicki Reaper on 3.9.17 after having her heart cath done on 2.27.17. She stated she may not feel up to coming .

## 2015-07-25 NOTE — Telephone Encounter (Signed)
Spoke with patient.  Discussed the importance of follow up appts.  She will call closer to the appt date if she is not up to coming in to reschedule if needed.

## 2015-07-25 NOTE — Telephone Encounter (Signed)
Left a VM to return my call. 

## 2015-07-25 NOTE — Telephone Encounter (Signed)
Patient will be at contact [phone] for today

## 2015-07-25 NOTE — Telephone Encounter (Deleted)
Patient stated that she received a call from [place]. She's not sure what it was about.  Contact [phone]

## 2015-07-26 ENCOUNTER — Telehealth: Payer: Self-pay | Admitting: Cardiovascular Disease

## 2015-07-26 NOTE — Telephone Encounter (Signed)
°*  STAT* If patient is at the pharmacy, call can be transferred to refill team.   1. Which medications need to be refilled? (please list name of each medication and dose if known) Lasik   2. Which pharmacy/location (including street and city if local pharmacy) [facility] [address]   3. Do they need a 30 day or 90 day supply? [place]

## 2015-07-26 NOTE — Telephone Encounter (Signed)
Please review for refill. Thanks!  

## 2015-07-26 NOTE — Telephone Encounter (Signed)
I don't see where I have ever seen her. It looks like she has been on Lasix 20 mg in the past. She will also need to hold this pre-cath with Dr. Rockey Situ.

## 2015-07-29 ENCOUNTER — Telehealth: Payer: Self-pay | Admitting: Cardiovascular Disease

## 2015-07-29 ENCOUNTER — Other Ambulatory Visit: Payer: Self-pay | Admitting: Cardiovascular Disease

## 2015-07-29 ENCOUNTER — Other Ambulatory Visit: Payer: Self-pay | Admitting: Internal Medicine

## 2015-07-29 ENCOUNTER — Other Ambulatory Visit: Payer: Self-pay | Admitting: *Deleted

## 2015-07-29 MED ORDER — FUROSEMIDE 20 MG PO TABS: 20.0000 mg | ORAL_TABLET | Freq: Every day | ORAL | Status: DC | PRN

## 2015-07-29 NOTE — Telephone Encounter (Signed)
Pt calling to let us know she takes 20 mg of lasik as needed

## 2015-07-29 NOTE — Telephone Encounter (Signed)
LMTCB to confirm lasix dosage not listed on med list.

## 2015-07-29 NOTE — Telephone Encounter (Signed)
For now I would write Lasix 20 mg daily when necessary Number pills 30 with several refills

## 2015-07-29 NOTE — Telephone Encounter (Signed)
Pt takes Furosemide 20 mg qd prn.

## 2015-07-29 NOTE — Telephone Encounter (Signed)
Refill sent for furosemide  

## 2015-08-04 ENCOUNTER — Encounter: Payer: Self-pay | Admitting: Internal Medicine

## 2015-08-04 NOTE — Assessment & Plan Note (Signed)
Blood pressure doing better.  Continue current medication regimen.  Follow metabolic panel.  Cr just checked 1.3.  Recheck today.

## 2015-08-04 NOTE — Assessment & Plan Note (Signed)
Followed by cardiology.  Has f/u planned for tomorrow.  Currently breathing better.

## 2015-08-04 NOTE — Assessment & Plan Note (Signed)
On lipitor.  Low cholesterol diet and exercise.  Follow lipid panel and liver function tests.   

## 2015-08-04 NOTE — Assessment & Plan Note (Signed)
Recently treated with prednisone.  Breathing better.  Continue inhalers.  Follow.  Recommended smoking cessation.

## 2015-08-04 NOTE — Assessment & Plan Note (Signed)
Controlled on protonix.   

## 2015-08-04 NOTE — Assessment & Plan Note (Signed)
History of anemia which was felt to be related to iron deficiency and chronic renal disease.  Recheck cbc, iron studies and B12.

## 2015-08-04 NOTE — Assessment & Plan Note (Signed)
Recent creatinine has improved some.  Cr 1.3.  Recheck today.

## 2015-08-04 NOTE — Assessment & Plan Note (Signed)
Discussed the need to quit.  Follow.  

## 2015-08-04 NOTE — Assessment & Plan Note (Signed)
Carotid ultrasound results reviewed - in overview.  Recommended f/u yearly.  Last 02/2015.

## 2015-08-04 NOTE — Assessment & Plan Note (Addendum)
Saw Dr Bary Castilla.  Mammogram as outlined.  Recommended f/u mammogram in 6 months.  Last 01/2015.  Due f/u.  Ordered.

## 2015-08-05 ENCOUNTER — Encounter
Admission: RE | Disposition: A | Discharge status: Home / Self Care | Payer: Self-pay | Source: Ambulatory Visit | Attending: Cardiovascular Disease

## 2015-08-05 ENCOUNTER — Other Ambulatory Visit: Payer: Self-pay | Admitting: Cardiovascular Disease

## 2015-08-05 ENCOUNTER — Encounter: Payer: Self-pay | Admitting: *Deleted

## 2015-08-05 ENCOUNTER — Ambulatory Visit
Admission: RE | Admit: 2015-08-05 | Discharge: 2015-08-05 | Disposition: A | Discharge status: Home / Self Care | Payer: Commercial Managed Care - HMO | Source: Ambulatory Visit | Attending: Cardiovascular Disease | Admitting: Cardiovascular Disease

## 2015-08-05 ENCOUNTER — Encounter: Payer: Self-pay | Admitting: Respiratory Therapy

## 2015-08-05 DIAGNOSIS — I272 Other secondary pulmonary hypertension: Secondary | ICD-10-CM | POA: Insufficient documentation

## 2015-08-05 DIAGNOSIS — N182 Chronic kidney disease, stage 2 (mild): Secondary | ICD-10-CM | POA: Diagnosis not present

## 2015-08-05 DIAGNOSIS — I2510 Atherosclerotic heart disease of native coronary artery without angina pectoris: Secondary | ICD-10-CM | POA: Diagnosis not present

## 2015-08-05 DIAGNOSIS — R0602 Shortness of breath: Secondary | ICD-10-CM | POA: Insufficient documentation

## 2015-08-05 DIAGNOSIS — I209 Angina pectoris, unspecified: Secondary | ICD-10-CM

## 2015-08-05 DIAGNOSIS — R9431 Abnormal electrocardiogram [ECG] [EKG]: Secondary | ICD-10-CM | POA: Diagnosis present

## 2015-08-05 DIAGNOSIS — Z79899 Other long term (current) drug therapy: Secondary | ICD-10-CM | POA: Insufficient documentation

## 2015-08-05 DIAGNOSIS — K219 Gastro-esophageal reflux disease without esophagitis: Secondary | ICD-10-CM | POA: Insufficient documentation

## 2015-08-05 DIAGNOSIS — Z888 Allergy status to other drugs, medicaments and biological substances status: Secondary | ICD-10-CM | POA: Diagnosis not present

## 2015-08-05 DIAGNOSIS — Z7982 Long term (current) use of aspirin: Secondary | ICD-10-CM | POA: Insufficient documentation

## 2015-08-05 DIAGNOSIS — I5042 Chronic combined systolic (congestive) and diastolic (congestive) heart failure: Secondary | ICD-10-CM | POA: Insufficient documentation

## 2015-08-05 DIAGNOSIS — R079 Chest pain, unspecified: Secondary | ICD-10-CM | POA: Insufficient documentation

## 2015-08-05 DIAGNOSIS — D509 Iron deficiency anemia, unspecified: Secondary | ICD-10-CM | POA: Insufficient documentation

## 2015-08-05 DIAGNOSIS — J449 Chronic obstructive pulmonary disease, unspecified: Secondary | ICD-10-CM | POA: Diagnosis not present

## 2015-08-05 DIAGNOSIS — I259 Chronic ischemic heart disease, unspecified: Secondary | ICD-10-CM | POA: Insufficient documentation

## 2015-08-05 DIAGNOSIS — I129 Hypertensive chronic kidney disease with stage 1 through stage 4 chronic kidney disease, or unspecified chronic kidney disease: Secondary | ICD-10-CM | POA: Diagnosis not present

## 2015-08-05 DIAGNOSIS — I429 Cardiomyopathy, unspecified: Secondary | ICD-10-CM | POA: Diagnosis not present

## 2015-08-05 DIAGNOSIS — F17200 Nicotine dependence, unspecified, uncomplicated: Secondary | ICD-10-CM | POA: Insufficient documentation

## 2015-08-05 DIAGNOSIS — Z86010 Personal history of colonic polyps: Secondary | ICD-10-CM | POA: Diagnosis not present

## 2015-08-05 DIAGNOSIS — M1990 Unspecified osteoarthritis, unspecified site: Secondary | ICD-10-CM | POA: Diagnosis not present

## 2015-08-05 DIAGNOSIS — I420 Dilated cardiomyopathy: Secondary | ICD-10-CM | POA: Insufficient documentation

## 2015-08-05 HISTORY — PX: CARDIAC CATHETERIZATION: SHX172

## 2015-08-05 SURGERY — LEFT HEART CATH AND CORONARY ANGIOGRAPHY
Anesthesia: Moderate Sedation | Laterality: Left

## 2015-08-05 SURGERY — LEFT HEART CATH AND CORONARY ANGIOGRAPHY
Anesthesia: Moderate Sedation | Laterality: Bilateral

## 2015-08-05 MED ORDER — FENTANYL CITRATE (PF) 100 MCG/2ML IJ SOLN
INTRAMUSCULAR | Status: AC
  Filled 2015-08-05: qty 2

## 2015-08-05 MED ORDER — HEPARIN (PORCINE) IN NACL 2-0.9 UNIT/ML-% IJ SOLN
INTRAMUSCULAR | Status: AC
  Filled 2015-08-05: qty 500

## 2015-08-05 MED ORDER — FUROSEMIDE 10 MG/ML IJ SOLN
INTRAMUSCULAR | Status: AC
  Filled 2015-08-05: qty 2

## 2015-08-05 MED ORDER — MIDAZOLAM HCL 2 MG/2ML IJ SOLN
INTRAMUSCULAR | Status: AC
  Filled 2015-08-05: qty 2

## 2015-08-05 MED ORDER — MIDAZOLAM HCL 2 MG/2ML IJ SOLN
INTRAMUSCULAR | Status: DC | PRN
  Administered 2015-08-05 (×2): 0.5 mg via INTRAVENOUS

## 2015-08-05 MED ORDER — ASPIRIN 81 MG PO CHEW: 81.0000 mg | CHEWABLE_TABLET | ORAL | Status: DC

## 2015-08-05 MED ORDER — NITROGLYCERIN 5 MG/ML IV SOLN
INTRAVENOUS | Status: AC
  Filled 2015-08-05: qty 10

## 2015-08-05 MED ORDER — ALBUTEROL SULFATE (2.5 MG/3ML) 0.083% IN NEBU: 2.5000 mg | INHALATION_SOLUTION | Freq: Once | RESPIRATORY_TRACT | Status: DC

## 2015-08-05 MED ORDER — SODIUM CHLORIDE 0.9 % WEIGHT BASED INFUSION: 1.0000 mL/kg/h | INTRAVENOUS | Status: DC

## 2015-08-05 MED ORDER — IPRATROPIUM-ALBUTEROL 0.5-2.5 (3) MG/3ML IN SOLN: 3.0000 mL | Freq: Four times a day (QID) | RESPIRATORY_TRACT | Status: DC

## 2015-08-05 MED ORDER — SODIUM CHLORIDE 0.9 % WEIGHT BASED INFUSION
3.0000 mL/kg/h | INTRAVENOUS | Status: DC
  Administered 2015-08-05: 3 mL/kg/h via INTRAVENOUS

## 2015-08-05 MED ORDER — FUROSEMIDE 10 MG/ML IJ SOLN
20.0000 mg | INTRAMUSCULAR | Status: AC
  Administered 2015-08-05: 20 mg via INTRAVENOUS

## 2015-08-05 MED ORDER — IPRATROPIUM-ALBUTEROL 0.5-2.5 (3) MG/3ML IN SOLN
RESPIRATORY_TRACT | Status: AC
  Administered 2015-08-05: 3 mL
  Filled 2015-08-05: qty 3

## 2015-08-05 SURGICAL SUPPLY — 10 items
CATH INFINITI 5FR ANG PIGTAIL (CATHETERS) ×3 IMPLANT
CATH INFINITI 5FR JL4 (CATHETERS) ×3 IMPLANT
CATH INFINITI JR4 5F (CATHETERS) ×3 IMPLANT
DEVICE CLOSURE MYNXGRIP 5F (Vascular Products) IMPLANT
KIT MANI 3VAL PERCEP (MISCELLANEOUS) ×3 IMPLANT
NEEDLE PERC 18GX7CM (NEEDLE) ×3 IMPLANT
NEEDLE SMART 18G ACCESS (NEEDLE) ×3 IMPLANT
PACK CARDIAC CATH (CUSTOM PROCEDURE TRAY) ×3 IMPLANT
SHEATH AVANTI 5FR X 11CM (SHEATH) ×3 IMPLANT
WIRE EMERALD 3MM-J .035X150CM (WIRE) ×3 IMPLANT

## 2015-08-05 NOTE — Discharge Instructions (Signed)
Groin Insertion Instructions-If you lose feeling or develop tingling or pain in your leg or foot after the procedure, please walk around first.  If the discomfort does not improve , contact your physician and proceed to the nearest emergency room.  Loss of feeling in your leg might mean that a blockage has formed in the artery and this can be appropriately treated.  Limit your activity for the next two days after your procedure.  Avoid stooping, bending, heavy lifting or exertion as this may put pressure on the insertion site.  Resume normal activities in 48 hours.  You may shower after 24 hours but avoid excessive warm water and do not scrub the site.  Remove clear dressing in 48 hours.  If you have had a closure device inserted, do not soak in a tub bath or a hot tub for at least one week. ° °No driving for 48 hours after discharge.  After the procedure, check the insertion site occasionally.  If any oozing occurs or there is apparent swelling, firm pressure over the site will prevent a bruise from forming.  You can not hurt anything by pressing directly on the site.  The pressure stops the bleeding by allowing a small clot to form.  If the bleeding continues after the pressure has been applied for more than 15 minutes, call 911 or go to the nearest emergency room.   ° °The x-ray dye causes you to pass a considerate amount of urine.  For this reason, you will be asked to drink plenty of liquids after the procedure to prevent dehydration.  You may resume you regular diet.  Avoid caffeine products.   ° °For pain at the site of your procedure, take non-aspirin medicines such as Tylenol. ° °Medications: A. Hold Metformin for 48 hours if applicable.  B. Continue taking all your present medications at home unless your doctor prescribes any changes.Groin Insertion Instructions-If you lose feeling or develop tingling or pain in your leg or foot after the procedure, please walk around first.  If the discomfort does not  improve , contact your physician and proceed to the nearest emergency room.  Loss of feeling in your leg might mean that a blockage has formed in the artery and this can be appropriately treated.  Limit your activity for the next two days after your procedure.  Avoid stooping, bending, heavy lifting or exertion as this may put pressure on the insertion site.  Resume normal activities in 48 hours.  You may shower after 24 hours but avoid excessive warm water and do not scrub the site.  Remove clear dressing in 48 hours.  If you have had a closure device inserted, do not soak in a tub bath or a hot tub for at least one week. ° °No driving for 48 hours after discharge.  After the procedure, check the insertion site occasionally.  If any oozing occurs or there is apparent swelling, firm pressure over the site will prevent a bruise from forming.  You can not hurt anything by pressing directly on the site.  The pressure stops the bleeding by allowing a small clot to form.  If the bleeding continues after the pressure has been applied for more than 15 minutes, call 911 or go to the nearest emergency room.   ° °The x-ray dye causes you to pass a considerate amount of urine.  For this reason, you will be asked to drink plenty of liquids after the procedure to prevent dehydration.  You   may resume you regular diet.  Avoid caffeine products.   ° °For pain at the site of your procedure, take non-aspirin medicines such as Tylenol. ° °Medications: A. Hold Metformin for 48 hours if applicable.  B. Continue taking all your present medications at home unless your doctor prescribes any changes. °

## 2015-08-06 ENCOUNTER — Encounter: Payer: Self-pay | Admitting: Cardiovascular Disease

## 2015-08-08 ENCOUNTER — Other Ambulatory Visit: Payer: Self-pay

## 2015-08-14 ENCOUNTER — Encounter: Payer: Self-pay | Admitting: *Deleted

## 2015-08-14 ENCOUNTER — Other Ambulatory Visit: Payer: Self-pay | Admitting: Internal Medicine

## 2015-08-14 ENCOUNTER — Encounter: Payer: Self-pay | Admitting: Internal Medicine

## 2015-08-14 DIAGNOSIS — R928 Other abnormal and inconclusive findings on diagnostic imaging of breast: Secondary | ICD-10-CM

## 2015-08-14 NOTE — Progress Notes (Unsigned)
Order placed for ultrasounds.

## 2015-08-15 ENCOUNTER — Ambulatory Visit: Payer: Self-pay | Admitting: Internal Medicine

## 2015-08-16 ENCOUNTER — Ambulatory Visit (INDEPENDENT_AMBULATORY_CARE_PROVIDER_SITE_OTHER): Payer: Commercial Managed Care - HMO | Admitting: Cardiovascular Disease

## 2015-08-16 ENCOUNTER — Encounter: Payer: Self-pay | Admitting: Cardiovascular Disease

## 2015-08-16 VITALS — BP 102/80 | HR 80 | Ht 65.0 in | Wt 149.4 lb

## 2015-08-16 DIAGNOSIS — I779 Disorder of arteries and arterioles, unspecified: Secondary | ICD-10-CM | POA: Diagnosis not present

## 2015-08-16 DIAGNOSIS — E785 Hyperlipidemia, unspecified: Secondary | ICD-10-CM | POA: Diagnosis not present

## 2015-08-16 DIAGNOSIS — I739 Peripheral vascular disease, unspecified: Secondary | ICD-10-CM

## 2015-08-16 DIAGNOSIS — I10 Essential (primary) hypertension: Secondary | ICD-10-CM

## 2015-08-16 DIAGNOSIS — I25111 Atherosclerotic heart disease of native coronary artery with angina pectoris with documented spasm: Secondary | ICD-10-CM | POA: Diagnosis not present

## 2015-08-16 DIAGNOSIS — F17200 Nicotine dependence, unspecified, uncomplicated: Secondary | ICD-10-CM

## 2015-08-16 DIAGNOSIS — Z720 Tobacco use: Secondary | ICD-10-CM | POA: Diagnosis not present

## 2015-08-16 MED ORDER — CARVEDILOL 6.25 MG PO TABS: 6.2500 mg | ORAL_TABLET | Freq: Two times a day (BID) | ORAL | Status: DC

## 2015-08-16 MED ORDER — LISINOPRIL 10 MG PO TABS
10.0000 mg | ORAL_TABLET | Freq: Every day | ORAL | Status: DC
Start: 2015-08-16 — End: 2015-09-18

## 2015-08-16 NOTE — Progress Notes (Signed)
Patient ID: Sandra Ellison, female    DOB: 27-Apr-1946, 70 y.o.   MRN: [id number]  HPI Comments: 70 year old woman with a history of peptic ulcer disease,  cardiomyopathy with history of systolic and diastolic CHF, COPD with long smoking history who continues to smoke, pulmonary hypertension seen on echocardiogram in March 2012 who presented to [facility] with increasing shortness of breath on February 11, 2011, chronic renal insufficiency, stage II, severe iron deficiency anemia, history of systolic heart failure, cardiac catheterization February 2017 for worsening ejection fraction found to have   moderate left main disease, who presents for follow-up of her systolic CHF  She reports that she has recovered from her cardiac catheterization, she does have some bruising in her right groin Right groin was evaluated showing region of ecchymoses though otherwise feeling well She denies any chest pain concerning for angina Reports that she is taking Lasix every other day  Previously taken off entresto and ARB's for high potassium We did try bidil that she developed headaches Reports she is tolerating hydralazine Was previously on lisinopril 10 mg daily several years ago and reports having no side effect  She is still smoking, She is interested in starting Chantix Difficulty measuring blood pressures in the left arm  Review of her cardiac catheterization details with her shows LM lesion, 65% stenosed, Ost LAD lesion, 50% stenosed, Dist Cx to LPDA lesion, 80% stenosed, 2nd Mrg lesion, 40% stenosed.  Other past medical history Seen in the emergency room twice, once in 05/29/2015 for shortness of breath with cough felt to be from COPD exacerbation, given a Z-Pak and steroids Second trip to the ER 07/18/2015 for nausea vomiting felt to be a GI bug  Recent CT scan of the chest reviewed with her in detail showing severe three-vessel coronary artery disease, moderate plaquing in her aorta.  Last  echocardiogram December 2015 showing ejection fraction 20-25% which was down from 30-35% in August 2013  On today's visit, she has baseline shortness of breath, reports her weight is relatively stable Has to stop and rest, recover when she has breathing spells Previously has had chest pain symptoms with acute shortness of breath. She calls these her "spells"  EKG from 07/18/2015 showing ST and T wave abnormality anterolateral leads   other past medical history reviewed Echo done in the hospital 05/17/2014 was reviewed with her showing ejection fraction 20-25%, mildly elevated right trigger systolic pressures  Previous hospital admission in 2012 she was found to have CHF, moderate bilateral pleural effusions with BNP of 26,000. Underlying anemia was a significant component of her presentation. Previously received procrit every other week, bone marrow biopsy, received packed red blood cells x2, iron transfusion x2,  followed by Dr. Ma Hillock.  Echocardiogram in the hospital showed ejection fraction 30-35%, mild to moderate right ventricular systolic pressure estimated at 40-50 mmHg  Echocardiogram performed February 11, 2011 shows ejection fraction less than 25% with severe global hypokinesis, apical akinesis, normal right ventricular systolic function , mild to moderate mitral valve regurgitation, normal right ventricular systolic pressures  Stress test done in the hospital on February 13 2011 shows no significant ischemia. She has a dilated cardiomyopathy with ejection fraction 27%, old scar in the anteroseptal wall, inferior and inferolateral wall. This was a pharmacologic study         Allergies  Allergen Reactions  . Ranitidine Anaphylaxis  . Ranitidine Hcl Anaphylaxis and Swelling  . Entresto [Sacubitril-Valsartan] Other (See Comments)    Potassium issues    Outpatient  Encounter Prescriptions as of 08/16/2015  Medication Sig  . albuterol (PROVENTIL HFA;VENTOLIN HFA) 108 (90 Base)  MCG/ACT inhaler Inhale 2 puffs into the lungs every 6 (six) hours as needed for wheezing or shortness of breath.  Marland Kitchen aspirin EC 81 MG tablet Take 81 mg by mouth daily.  Marland Kitchen atorvastatin (LIPITOR) 40 MG tablet Take 1 tablet (40 mg total) by mouth daily.  . carvedilol (COREG) 6.25 MG tablet Take 1 tablet (6.25 mg total) by mouth 2 (two) times daily with a meal.  . Fe Fum-FePoly-Vit C-Vit B3 (INTEGRA) 62.5-62.5-40-3 MG CAPS One per day  . furosemide (LASIX) 20 MG tablet TAKE 1 TABLET (20 MG TOTAL) BY MOUTH DAILY AS NEEDED.  Marland Kitchen gabapentin (NEURONTIN) 100 MG capsule TAKE 1 CAPSULE (100 MG TOTAL) BY MOUTH 3 (THREE) TIMES DAILY AS NEEDED.  Marland Kitchen gabapentin (NEURONTIN) 300 MG capsule TAKE ONE CAPSULE BY MOUTH 3 TIMES A DAY AS NEEDED  . hydrALAZINE (APRESOLINE) 25 MG tablet Take 25 mg by mouth 3 (three) times daily.   . Nebulizer MISC USE AS DIRECTED  . pantoprazole (PROTONIX) 40 MG tablet TAKE 1 TABLET (40 MG TOTAL) BY MOUTH DAILY.  Marland Kitchen polyethylene glycol (MIRALAX / GLYCOLAX) packet Take 17 g by mouth daily as needed for mild constipation.  Marland Kitchen tiotropium (SPIRIVA HANDIHALER) 18 MCG inhalation capsule One inhalation daily  . [DISCONTINUED] carvedilol (COREG) 3.125 MG tablet TAKE 1 TABLET (3.125 MG TOTAL) BY MOUTH 2 (TWO) TIMES DAILY WITH A MEAL.  Marland Kitchen lisinopril (PRINIVIL,ZESTRIL) 10 MG tablet Take 1 tablet (10 mg total) by mouth daily.  . [DISCONTINUED] furosemide (LASIX) 20 MG tablet Take 1 tablet (20 mg total) by mouth daily as needed.   No facility-administered encounter medications on file as of 08/16/2015.    Past Medical History  Diagnosis Date  . Chronic combined systolic and diastolic CHF (congestive heart failure) ([place])     a. 01/2012 Echo: EF 30-35%, mod conc LVH, sev inf HK, mildly dil LA, mild-mod MR, Trace TR, RVSP 40-28mHg.    . Dilated cardiomyopathy ([place]     a. 02/2011 MV: EF 27%, anteroseptal, inf, inferolateral scar, no ischemia.  .Marland KitchenCOPD (chronic obstructive pulmonary disease) ([facility]   . Hx of  colonic polyps   . Iron deficiency anemia     a. h/o transfusions, prev followed by heme/onc.  . Chronic kidney disease (CKD), stage II (mild)   . Pulmonary HTN ([facility]   . Arthritis   . Allergy   . History of diverticulosis   . History of abnormal Pap smear     a. s/p vaginal hysterectomy  . GERD (gastroesophageal reflux disease)   . Hyperkalemia     a. 02/2015 in setting of entresto/AKI->entresto d/c'd.  . Carotid arterial disease ([place]     a. 02/2015 Carotid U/S: RICA 663-78% LICA 458-85%  . Tobacco abuse   . Hypertension     Past Surgical History  Procedure Laterality Date  . Tonsillectomy  age 70 . Vaginal hysterectomy  1981    secondary to abnormal pap smear  . Orif distal radius fracture  2011    right  . Colonoscopy  10/2010  . Cardiac catheterization Left 08/05/2015    Procedure: Left Heart Cath and Coronary Angiography;  Surgeon: TMinna Merritts MD;  [facility];  Service: Cardiovascular;  Laterality: Left;    Social History  reports that she has been smoking Cigarettes.  She has a 21 pack-year smoking history. She has never used smokeless tobacco.  She reports that she does not drink alcohol or use illicit drugs.  Family History family history includes Alcohol abuse in her father; Arthritis in her father and mother; Cancer in her father. There is no history of Breast cancer.   Review of Systems  Constitutional: Negative.   Eyes: Negative.   Cardiovascular: Negative.   Gastrointestinal: Negative.   Musculoskeletal: Positive for arthralgias.  Neurological: Negative.   Hematological: Negative.   Psychiatric/Behavioral: Negative.   All other systems reviewed and are negative.   BP 102/80 mmHg  Pulse 80  Ht 5' 5"  (1.651 m)  Wt 149 lb 6.4 oz (67.767 kg)  BMI 24.86 kg/m2  SpO2 96%  Physical Exam  Constitutional: She is oriented to person, place, and time. She appears well-developed and well-nourished.  HENT:  Head: Normocephalic.  Nose:  Nose normal.  Mouth/Throat: Oropharynx is clear and moist.  Eyes: Conjunctivae are normal. Pupils are equal, round, and reactive to light.  Neck: Normal range of motion. Neck supple. No JVD present.  Cardiovascular: Normal rate, regular rhythm, S1 normal, S2 normal, normal heart sounds and intact distal pulses.  Exam reveals no gallop and no friction rub.   No murmur heard. Pulmonary/Chest: Effort normal. No respiratory distress. She has decreased breath sounds. She exhibits no tenderness.  Abdominal: Soft. Bowel sounds are normal. She exhibits no distension. There is no tenderness.  Musculoskeletal: Normal range of motion. She exhibits no edema or tenderness.  Lymphadenopathy:    She has no cervical adenopathy.  Neurological: She is alert and oriented to person, place, and time. Coordination normal.  Skin: Skin is warm and dry. No rash noted. No erythema.  Right groin with large region of ecchymoses, nontender  Psychiatric: She has a normal mood and affect. Her behavior is normal. Judgment and thought content normal.    Assessment and Plan  Nursing note and vitals reviewed.

## 2015-08-16 NOTE — Assessment & Plan Note (Signed)
Cholesterol is at goal on the current lipid regimen. No changes to the medications were made.  

## 2015-08-16 NOTE — Assessment & Plan Note (Signed)
We have encouraged her to continue to work on weaning her cigarettes and smoking cessation. She will continue to work on this,  She reports that she has a prescription for chantix.

## 2015-08-16 NOTE — Assessment & Plan Note (Signed)
60% carotid disease on the right,  Also with suspected left subclavian disease with decreased pulsation of the radial artery on the left  We will record all blood pressures on the right arm , [id number] systolic today on the right, drop down to 100 on the left

## 2015-08-16 NOTE — Patient Instructions (Addendum)
You are doing well.  Please start the chantix twice a day for at least three months  Please start lisinopril 10 mg daily Please increase the coreg up to 6.25 mg twice a day  Please call us if you have new issues that need to be addressed before your next appt.  Your physician wants you to follow-up in: 3 months.  You will receive a reminder letter in the mail two months in advance. If you don't receive a letter, please call our office to schedule the follow-up appointment.

## 2015-08-16 NOTE — Assessment & Plan Note (Signed)
Severe disease including 60% left main disease on recent cardiac catheterization  Strongly recommended smoking cessation  Cholesterol is at goal

## 2015-08-16 NOTE — Assessment & Plan Note (Signed)
For her cardiomyopathy,  Recommended she increase carvedilol up to 6.25 mill grams twice a day, we will start lisinopril 10 mg daily  Blood pressure [id number] systolic on the right arm  We'll avoid blood pressure measurements of the left arm given stenosis  If blood pressure tolerates, would increase lisinopril up to 20 mg follow-up , will need BMP to check potassium   Total encounter time more than 25 minutes  Greater than 50% was spent in counseling and coordination of care with the patient

## 2015-08-20 ENCOUNTER — Ambulatory Visit: Payer: Self-pay | Admitting: Cardiovascular Disease

## 2015-08-22 ENCOUNTER — Emergency Department
Admission: EM | Admit: 2015-08-22 | Discharge: 2015-08-22 | Disposition: A | Discharge status: Home / Self Care | Payer: Commercial Managed Care - HMO | Attending: Emergency Medicine | Admitting: Emergency Medicine

## 2015-08-22 ENCOUNTER — Emergency Department: Payer: Commercial Managed Care - HMO

## 2015-08-22 ENCOUNTER — Encounter: Payer: Self-pay | Admitting: Emergency Medicine

## 2015-08-22 ENCOUNTER — Ambulatory Visit: Payer: Self-pay | Admitting: Internal Medicine

## 2015-08-22 DIAGNOSIS — I420 Dilated cardiomyopathy: Secondary | ICD-10-CM | POA: Insufficient documentation

## 2015-08-22 DIAGNOSIS — R51 Headache: Secondary | ICD-10-CM | POA: Diagnosis not present

## 2015-08-22 DIAGNOSIS — F17210 Nicotine dependence, cigarettes, uncomplicated: Secondary | ICD-10-CM | POA: Insufficient documentation

## 2015-08-22 DIAGNOSIS — M1990 Unspecified osteoarthritis, unspecified site: Secondary | ICD-10-CM | POA: Diagnosis not present

## 2015-08-22 DIAGNOSIS — I272 Other secondary pulmonary hypertension: Secondary | ICD-10-CM | POA: Diagnosis not present

## 2015-08-22 DIAGNOSIS — Z7982 Long term (current) use of aspirin: Secondary | ICD-10-CM | POA: Diagnosis not present

## 2015-08-22 DIAGNOSIS — J449 Chronic obstructive pulmonary disease, unspecified: Secondary | ICD-10-CM | POA: Insufficient documentation

## 2015-08-22 DIAGNOSIS — K219 Gastro-esophageal reflux disease without esophagitis: Secondary | ICD-10-CM | POA: Insufficient documentation

## 2015-08-22 DIAGNOSIS — E785 Hyperlipidemia, unspecified: Secondary | ICD-10-CM | POA: Insufficient documentation

## 2015-08-22 DIAGNOSIS — Z79899 Other long term (current) drug therapy: Secondary | ICD-10-CM | POA: Diagnosis not present

## 2015-08-22 DIAGNOSIS — N182 Chronic kidney disease, stage 2 (mild): Secondary | ICD-10-CM | POA: Diagnosis not present

## 2015-08-22 DIAGNOSIS — I779 Disorder of arteries and arterioles, unspecified: Secondary | ICD-10-CM | POA: Insufficient documentation

## 2015-08-22 DIAGNOSIS — I130 Hypertensive heart and chronic kidney disease with heart failure and stage 1 through stage 4 chronic kidney disease, or unspecified chronic kidney disease: Secondary | ICD-10-CM | POA: Insufficient documentation

## 2015-08-22 DIAGNOSIS — I5042 Chronic combined systolic (congestive) and diastolic (congestive) heart failure: Secondary | ICD-10-CM | POA: Diagnosis not present

## 2015-08-22 DIAGNOSIS — R0600 Dyspnea, unspecified: Secondary | ICD-10-CM

## 2015-08-22 DIAGNOSIS — R0602 Shortness of breath: Secondary | ICD-10-CM | POA: Diagnosis not present

## 2015-08-22 DIAGNOSIS — R05 Cough: Secondary | ICD-10-CM | POA: Diagnosis not present

## 2015-08-22 LAB — BASIC METABOLIC PANEL
ANION GAP: 9 (ref 5–15)
BUN: 19 mg/dL (ref 6–20)
CHLORIDE: 104 mmol/L (ref 101–111)
CO2: 24 mmol/L (ref 22–32)
Calcium: 8.6 mg/dL — ABNORMAL LOW (ref 8.9–10.3)
Creatinine, Ser: 1.17 mg/dL — ABNORMAL HIGH (ref 0.44–1.00)
GFR calc Af Amer: 54 mL/min — ABNORMAL LOW (ref >60)
GFR, EST NON AFRICAN AMERICAN: 46 mL/min — AB (ref >60)
GLUCOSE: 109 mg/dL — AB (ref 65–99)
POTASSIUM: 3.9 mmol/L (ref 3.5–5.1)
SODIUM: 137 mmol/L (ref 135–145)

## 2015-08-22 LAB — TROPONIN I

## 2015-08-22 LAB — CBC
HEMATOCRIT: 32.9 % — AB (ref 35.0–47.0)
HEMOGLOBIN: 10.9 g/dL — AB (ref 12.0–16.0)
MCH: 29.4 pg (ref 26.0–34.0)
MCHC: 33.3 g/dL (ref 32.0–36.0)
MCV: 88.2 fL (ref 80.0–100.0)
Platelets: 215 10*3/uL (ref 150–440)
RBC: 3.72 MIL/uL — AB (ref 3.80–5.20)
RDW: 16.3 % — ABNORMAL HIGH (ref 11.5–14.5)
WBC: 6.6 10*3/uL (ref 3.6–11.0)

## 2015-08-22 LAB — BRAIN NATRIURETIC PEPTIDE: B NATRIURETIC PEPTIDE 5: 2890 pg/mL — AB (ref 0.0–100.0)

## 2015-08-22 MED ORDER — PREDNISONE 20 MG PO TABS: 40.0000 mg | ORAL_TABLET | Freq: Every day | ORAL | Status: AC

## 2015-08-22 MED ORDER — ONDANSETRON 4 MG PO TBDP
4.0000 mg | ORAL_TABLET | Freq: Once | ORAL | Status: AC
  Administered 2015-08-22: 4 mg via ORAL

## 2015-08-22 MED ORDER — ONDANSETRON 4 MG PO TBDP
ORAL_TABLET | ORAL | Status: AC
  Filled 2015-08-22: qty 1

## 2015-08-22 MED ORDER — IPRATROPIUM-ALBUTEROL 0.5-2.5 (3) MG/3ML IN SOLN
3.0000 mL | Freq: Once | RESPIRATORY_TRACT | Status: AC
  Administered 2015-08-22: 3 mL via RESPIRATORY_TRACT
  Filled 2015-08-22: qty 3

## 2015-08-22 MED ORDER — LORAZEPAM 1 MG PO TABS
1.0000 mg | ORAL_TABLET | Freq: Once | ORAL | Status: AC
  Administered 2015-08-22: 1 mg via ORAL
  Filled 2015-08-22: qty 1

## 2015-08-22 NOTE — Discharge Instructions (Signed)
Shortness of Breath Shortness of breath means you have trouble breathing. It could also mean that you have a medical problem. You should get immediate medical care for shortness of breath. CAUSES   Not enough oxygen in the air such as with high altitudes or a smoke-filled room.  Certain lung diseases, infections, or problems.  Heart disease or conditions, such as angina or heart failure.  Low red blood cells (anemia).  Poor physical fitness, which can cause shortness of breath when you exercise.  Chest or back injuries or stiffness.  Being overweight.  Smoking.  Anxiety, which can make you feel like you are not getting enough air. DIAGNOSIS  Serious medical problems can often be found during your physical exam. Tests may also be done to determine why you are having shortness of breath. Tests may include:  Chest X-rays.  Lung function tests.  Blood tests.  An electrocardiogram (ECG).  An ambulatory electrocardiogram. An ambulatory ECG records your heartbeat patterns over a 24-hour period.  Exercise testing.  A transthoracic echocardiogram (TTE). During echocardiography, sound waves are used to evaluate how blood flows through your heart.  A transesophageal echocardiogram (TEE).  Imaging scans. Your health care provider may not be able to find a cause for your shortness of breath after your exam. In this case, it is important to have a follow-up exam with your health care provider as directed.  TREATMENT  Treatment for shortness of breath depends on the cause of your symptoms and can vary greatly. HOME CARE INSTRUCTIONS   Do not smoke. Smoking is a common cause of shortness of breath. If you smoke, ask for help to quit.  Avoid being around chemicals or things that may bother your breathing, such as paint fumes and dust.  Rest as needed. Slowly resume your usual activities.  If medicines were prescribed, take them as directed for the full length of time directed. This  includes oxygen and any inhaled medicines.  Keep all follow-up appointments as directed by your health care provider. SEEK MEDICAL CARE IF:   Your condition does not improve in the time expected.  You have a hard time doing your normal activities even with rest.  You have any new symptoms. SEEK IMMEDIATE MEDICAL CARE IF:   Your shortness of breath gets worse.  You feel light-headed, faint, or develop a cough not controlled with medicines.  You start coughing up blood.  You have pain with breathing.  You have chest pain or pain in your arms, shoulders, or abdomen.  You have a fever.  You are unable to walk up stairs or exercise the way you normally do. MAKE SURE YOU:  Understand these instructions.  Will watch your condition.  Will get help right away if you are not doing well or get worse.   This information is not intended to replace advice given to you by your health care provider. Make sure you discuss any questions you have with your health care provider.   Document Released: 02/17/2001 Document Revised: 05/30/2013 Document Reviewed: 08/10/2011 Elsevier Interactive Patient Education [employer].  Please return immediately if condition worsens. Please contact her primary physician or the physician you were given for referral. If you have any specialist physicians involved in her treatment and plan please also contact them. Thank you for using [place] [facility]. You may also take Lasix as needed. Please contact your primary physician for further outpatient management

## 2015-08-22 NOTE — ED Provider Notes (Signed)
Time Seen: Approximately 1120 I have reviewed the triage notes  Chief Complaint: Shortness of Breath and Headache   History of Present Illness: Sandra Ellison is a 70 y.o. female who states that she's noticed since last night some shortness of breath. Patient has a long history of congestive heart failure, cardiomyopathy, and COPD. The patient states she's had some sinus drainage and a cough. She states with the cough she has some mild frontal headache. She's had nausea with no persistent vomiting. She is not aware of an objective fever at home and denies any hemoptysis some extra swelling in her lower extremities so she took her Lasix prior to arrival and states overall she is feeling better. He denies any chest pain, arm pain, jaw pain. He denies any abdominal discomfort and loose stool or diarrhea. Shortness of breath does not seem to be positional in nature   Past Medical History  Diagnosis Date  . Chronic combined systolic and diastolic CHF (congestive heart failure) ([place])     a. 01/2012 Echo: EF 30-35%, mod conc LVH, sev inf HK, mildly dil LA, mild-mod MR, Trace TR, RVSP 40-52mmHg.    . Dilated cardiomyopathy ([place])     a. 02/2011 MV: EF 27%, anteroseptal, inf, inferolateral scar, no ischemia.  Marland Kitchen COPD (chronic obstructive pulmonary disease) ([place])   . Hx of colonic polyps   . Iron deficiency anemia     a. h/o transfusions, prev followed by heme/onc.  . Chronic kidney disease (CKD), stage II (mild)   . Pulmonary HTN ([place])   . Arthritis   . Allergy   . History of diverticulosis   . History of abnormal Pap smear     a. s/p vaginal hysterectomy  . GERD (gastroesophageal reflux disease)   . Hyperkalemia     a. 02/2015 in setting of entresto/AKI->entresto d/c'd.  . Carotid arterial disease ([place])     a. 02/2015 Carotid U/S: RICA [id number], LICA [id number].  . Tobacco abuse   . Hypertension     Patient Active Problem List   Diagnosis Date Noted  . Angina pectoris ([place]) 08/05/2015  .  Congestive dilated cardiomyopathy ([place])   . Smoker   . Cardiomyopathy, ischemic 07/24/2015  . Foot swelling 06/15/2015  . Chronic combined systolic and diastolic CHF (congestive heart failure) ([place])   . Dilated cardiomyopathy ([place])   . Carotid arterial disease ([place])   . Tobacco abuse   . Diarrhea 03/17/2015  . Essential hypertension 03/17/2015  . Hyperkalemia 03/06/2015  . CKD (chronic kidney disease), stage IV ([place]) 03/06/2015  . Abnormal mammogram 02/23/2015  . Encounter for screening colonoscopy 01/26/2015  . Health care maintenance 08/12/2014  . Acute bronchitis 05/21/2014  . Chronic systolic CHF (congestive heart failure) ([place]) 05/21/2014  . Herpes zoster 02/01/2014  . Microcalcifications of the breast 01/24/2014  . Knee pain, bilateral 07/22/2013  . Hematuria 05/20/2013  . Pulmonary nodule 05/20/2013  . Insomnia 03/08/2013  . Left knee pain 03/08/2013  . Ulnar nerve injury 03/07/2013  . GERD (gastroesophageal reflux disease) 02/28/2013  . History of colonic polyps 02/28/2013  . Diverticulosis 02/28/2013  . Anemia 02/28/2013  . COPD (chronic obstructive pulmonary disease) ([place]) 02/28/2013  . Pulmonary hypertension ([place]) 02/28/2013  . Nonischemic cardiomyopathy ([place]) 02/26/2012  . Carotid bruit 02/26/2012  . SOB (shortness of breath) 03/03/2011  . Smoking 03/03/2011  . Hyperlipidemia 03/03/2011  . CAD (coronary artery disease) 03/03/2011    Past Surgical History  Procedure Laterality Date  . Tonsillectomy  age 44  .  Vaginal hysterectomy  1981    secondary to abnormal pap smear  . Orif distal radius fracture  2011    right  . Colonoscopy  10/2010  . Cardiac catheterization Left 08/05/2015    Procedure: Left Heart Cath and Coronary Angiography;  Surgeon: Minna Merritts, MD;  Location: [facility];  Service: Cardiovascular;  Laterality: Left;    Past Surgical History  Procedure Laterality Date  . Tonsillectomy  age 71  . Vaginal hysterectomy  1981     secondary to abnormal pap smear  . Orif distal radius fracture  2011    right  . Colonoscopy  10/2010  . Cardiac catheterization Left 08/05/2015    Procedure: Left Heart Cath and Coronary Angiography;  Surgeon: Minna Merritts, MD;  Location: [facility];  Service: Cardiovascular;  Laterality: Left;    Current Outpatient Rx  Name  Route  Sig  Dispense  Refill  . albuterol (PROVENTIL HFA;VENTOLIN HFA) 108 (90 Base) MCG/ACT inhaler   Inhalation   Inhale 2 puffs into the lungs every 6 (six) hours as needed for wheezing or shortness of breath.   1 Inhaler   3   . aspirin EC 81 MG tablet   Oral   Take 81 mg by mouth daily.         Marland Kitchen atorvastatin (LIPITOR) 40 MG tablet   Oral   Take 1 tablet (40 mg total) by mouth daily.   30 tablet   11   . carvedilol (COREG) 6.25 MG tablet   Oral   Take 1 tablet (6.25 mg total) by mouth 2 (two) times daily with a meal.   180 tablet   3   . Fe Fum-FePoly-Vit C-Vit B3 (INTEGRA) 62.5-62.5-40-3 MG CAPS      One per day   30 capsule   2   . furosemide (LASIX) 20 MG tablet      TAKE 1 TABLET (20 MG TOTAL) BY MOUTH DAILY AS NEEDED.   30 tablet   0   . gabapentin (NEURONTIN) 100 MG capsule      TAKE 1 CAPSULE (100 MG TOTAL) BY MOUTH 3 (THREE) TIMES DAILY AS NEEDED.   90 capsule   1   . gabapentin (NEURONTIN) 300 MG capsule      TAKE ONE CAPSULE BY MOUTH 3 TIMES A DAY AS NEEDED   90 capsule   0   . hydrALAZINE (APRESOLINE) 25 MG tablet   Oral   Take 25 mg by mouth 3 (three) times daily.          Marland Kitchen lisinopril (PRINIVIL,ZESTRIL) 10 MG tablet   Oral   Take 1 tablet (10 mg total) by mouth daily.   90 tablet   3   . Nebulizer MISC      USE AS DIRECTED   1 each   0   . pantoprazole (PROTONIX) 40 MG tablet      TAKE 1 TABLET (40 MG TOTAL) BY MOUTH DAILY.   30 tablet   11   . polyethylene glycol (MIRALAX / GLYCOLAX) packet   Oral   Take 17 g by mouth daily as needed for mild constipation.         Marland Kitchen tiotropium  (SPIRIVA HANDIHALER) 18 MCG inhalation capsule      One inhalation daily   30 capsule   5     Allergies:  Ranitidine; Ranitidine hcl; and Entresto  Family History: Family History  Problem Relation Age of Onset  .  Arthritis Mother   . Alcohol abuse Father   . Arthritis Father   . Cancer Father     throat/spine  . Breast cancer Neg Hx     Social History: Social History  Substance Use Topics  . Smoking status: Current Every Day Smoker -- 0.50 packs/day for 42 years    Types: Cigarettes  . Smokeless tobacco: Never Used     Comment: Down to 3 cigarettes per day  . Alcohol Use: No     Review of Systems:   10 point review of systems was performed and was otherwise negative:  Constitutional: No fever Eyes: No visual disturbances ENT: No sore throat, ear pain some postnasal drip Cardiac: No chest pain Respiratory: No shortness of breath, wheezing, or stridor Abdomen: No abdominal pain, no vomiting, No diarrhea Endocrine: No weight loss, No night sweats Extremities: Mild increase in chronic peripheral edema, cyanosis Skin: No rashes, easy bruising Neurologic: No focal weakness, trouble with speech or swollowing Urologic: No dysuria, Hematuria, or urinary frequency   Physical Exam:  ED Triage Vitals  Enc Vitals Group     BP 08/22/15 1039 124/52 mmHg     Pulse Rate 08/22/15 1039 88     Resp 08/22/15 1039 24     Temp 08/22/15 1039 98.5 F (36.9 C)     Temp Source 08/22/15 1039 Oral     SpO2 08/22/15 1039 98 %     Weight 08/22/15 1039 155 lb (70.308 kg)     Height 08/22/15 1039 5\' 5"  (1.651 m)     Head Cir --      Peak Flow --      Pain Score 08/22/15 1049 5     Pain Loc --      Pain Edu? --      Excl. in [place]? --     General: Awake , Alert , and Oriented times 3; GCS 15. No signs of respiratory distress, appears anxious Head: Normal cephalic , atraumatic Eyes: Pupils equal , round, extraocular eye movements are intact. Nasal: Mild bilateral nasal drainage,  patent upper airway without erythema or exudate.  Neck: Supple, Full range of motion, No anterior adenopathy or palpable thyroid masses Lungs: Mild and expiratory wheezing without rales or rhonchi Heart: Regular rate, regular rhythm without murmurs , gallops , or rubs Abdomen: Soft, non tender without rebound, guarding , or rigidity; bowel sounds positive and symmetric in all 4 quadrants. No organomegaly .        Extremities: 2 plus symmetric pulses. Mild bilateral peripheral edema, no clubbing or cyanosis Neurologic: normal ambulation, Motor symmetric without deficits, sensory intact Skin: warm, dry, no rashes   Labs:   All laboratory work was reviewed including any pertinent negatives or positives listed below:  Labs Reviewed  BASIC METABOLIC PANEL - Abnormal; Notable for the following:    Glucose, Bld 109 (*)    Creatinine, Ser 1.17 (*)    Calcium 8.6 (*)    GFR calc non Af Amer 46 (*)    GFR calc Af Amer 54 (*)    All other components within normal limits  CBC - Abnormal; Notable for the following:    RBC 3.72 (*)    Hemoglobin 10.9 (*)    HCT 32.9 (*)    RDW 16.3 (*)    All other components within normal limits  BRAIN NATRIURETIC PEPTIDE - Abnormal; Notable for the following:    B Natriuretic Peptide 2890.0 (*)    All other components within normal  limits  TROPONIN I   reviewed the patient's laboratory work shows essentially a normal creatinine with an elevated BNP  EKG: * ED ECG REPORT I, Daymon Larsen, the attending physician, personally viewed and interpreted this ECG.  Date: 08/22/2015 EKG Time: 1046 Rate: 86 Rhythm: normal sinus rhythm QRS Axis: normal Intervals: normal ST/T Wave abnormalities: normal Conduction Disturbances: none Narrative Interpretation: unremarkable Left ventricular hypertrophy, no acute ischemic changes   Radiology:     DG Chest 2 View (Final result) Result time: 08/22/15 11:16:18   Final result by Rad Results In Interface  (08/22/15 11:16:18)   Narrative:   CLINICAL DATA: Shortness of breath and cough for 2 days  EXAM: CHEST 2 VIEW  COMPARISON: 07/18/2015  FINDINGS: Cardiac shadow is enlarged. Interstitial changes are again identified stable from the prior exam. No focal infiltrate or sizable effusion is noted. Mild hyperinflation is seen. No bony abnormality is noted.  IMPRESSION: Chronic interstitial change without acute abnormality   Electronically Signed By: Inez Catalina M.D. On: 08/22/2015 11:16        I personally reviewed the radiologic studies    ED Course:  Patient's stay here was uneventful and after breathing treatment she felt symptomatically improved. She had a very taken some Lasix prior to arrival which also likely assisted her with that current treatment plan. She may have had mild exacerbation of her COPD and/or congestive heart failure. States that she has home nebulizers as needed and is done well with prednisone in the past and will try a short bolus of steroids. The patient's otherwise has no signs of respiratory distress and her pulse ox when awake remained stable.  Assessment:  Dyspnea COPD exacerbation with bronchitis      Plan:  Outpatient management Patient was advised to return immediately if condition worsens. Patient was advised to follow up with their primary care physician or other specialized physicians involved in their outpatient care. The patient and/or family member/power of attorney had laboratory results reviewed at the bedside. All questions and concerns were addressed and appropriate discharge instructions were distributed by the nursing staff.             Daymon Larsen, MD 08/22/15 1336

## 2015-08-22 NOTE — ED Notes (Signed)
Pt resting in bed on monitor, family at bedside, pt in no acute distress

## 2015-08-22 NOTE — ED Notes (Signed)
Pt reports shortness of breath since last night, reports taking SVN tx at home today with no relief. Pt also reports headache. Pt with hx of COPD.

## 2015-08-24 ENCOUNTER — Other Ambulatory Visit: Payer: Self-pay | Admitting: Cardiovascular Disease

## 2015-08-27 ENCOUNTER — Telehealth: Payer: Self-pay | Admitting: Cardiovascular Disease

## 2015-08-27 NOTE — Telephone Encounter (Signed)
Spoke w/ pt.  She reports that she has been SOB since Dr. Rockey Situ increased her coreg dose. Pt reports sx onset immediate after med change.  She has not tried to decrease dose to see if sx improve. She has f/u appt w/ her PCP on Thurs. Advised her to reduce coreg back to 3.125 mg and see if her sx improve. Advised her to monitor her BP, but she reports that she does not have BP cuff or scales, does not eat much, only had chicken broth and some Coke today. She will try to go back to previous dose and call her appt on Thurs w/ PCP and call back to let me know if she feels better.  Discussed anxiety issues w/ pt and reviewed stress reducing techniques. She is appreciative and much calmer at the end of our conversation.

## 2015-08-27 NOTE — Telephone Encounter (Signed)
Pt calling stating that we upped one of her doses and now she thinks it is causing her some SOB Went to ED states for that and  they told her it was Anxiety  She states it urgent.

## 2015-08-29 ENCOUNTER — Encounter: Payer: Self-pay | Admitting: Internal Medicine

## 2015-08-29 ENCOUNTER — Ambulatory Visit (INDEPENDENT_AMBULATORY_CARE_PROVIDER_SITE_OTHER): Payer: Commercial Managed Care - HMO | Admitting: Internal Medicine

## 2015-08-29 VITALS — BP 130/70 | HR 70 | Temp 97.5°F | Resp 18 | Ht 65.0 in | Wt 141.0 lb

## 2015-08-29 DIAGNOSIS — I255 Ischemic cardiomyopathy: Secondary | ICD-10-CM

## 2015-08-29 DIAGNOSIS — Z720 Tobacco use: Secondary | ICD-10-CM

## 2015-08-29 DIAGNOSIS — N184 Chronic kidney disease, stage 4 (severe): Secondary | ICD-10-CM

## 2015-08-29 DIAGNOSIS — F419 Anxiety disorder, unspecified: Secondary | ICD-10-CM

## 2015-08-29 DIAGNOSIS — I779 Disorder of arteries and arterioles, unspecified: Secondary | ICD-10-CM

## 2015-08-29 DIAGNOSIS — J449 Chronic obstructive pulmonary disease, unspecified: Secondary | ICD-10-CM

## 2015-08-29 DIAGNOSIS — I739 Peripheral vascular disease, unspecified: Secondary | ICD-10-CM

## 2015-08-29 DIAGNOSIS — R0602 Shortness of breath: Secondary | ICD-10-CM

## 2015-08-29 DIAGNOSIS — R062 Wheezing: Secondary | ICD-10-CM

## 2015-08-29 DIAGNOSIS — K219 Gastro-esophageal reflux disease without esophagitis: Secondary | ICD-10-CM

## 2015-08-29 DIAGNOSIS — I10 Essential (primary) hypertension: Secondary | ICD-10-CM | POA: Diagnosis not present

## 2015-08-29 DIAGNOSIS — I420 Dilated cardiomyopathy: Secondary | ICD-10-CM

## 2015-08-29 DIAGNOSIS — E785 Hyperlipidemia, unspecified: Secondary | ICD-10-CM

## 2015-08-29 DIAGNOSIS — R110 Nausea: Secondary | ICD-10-CM

## 2015-08-29 MED ORDER — ONDANSETRON 4 MG PO TBDP: 4.0000 mg | ORAL_TABLET | Freq: Two times a day (BID) | ORAL | Status: DC | PRN

## 2015-08-29 MED ORDER — CITALOPRAM HYDROBROMIDE 10 MG PO TABS
10.0000 mg | ORAL_TABLET | Freq: Every day | ORAL | Status: DC
Start: 2015-08-29 — End: 2015-10-20

## 2015-08-29 MED ORDER — PREDNISONE 10 MG PO TABS: ORAL_TABLET | ORAL | Status: DC

## 2015-08-29 MED ORDER — ALBUTEROL SULFATE (2.5 MG/3ML) 0.083% IN NEBU
2.5000 mg | INHALATION_SOLUTION | Freq: Once | RESPIRATORY_TRACT | Status: AC
  Administered 2015-08-29: 2.5 mg via RESPIRATORY_TRACT

## 2015-08-29 NOTE — Progress Notes (Signed)
Pre-visit discussion using our clinic review tool. No additional management support is needed unless otherwise documented below in the visit note.  

## 2015-08-29 NOTE — Progress Notes (Signed)
Patient ID: Sandra Ellison, female   DOB: [birth year], 70 y.o.   MRN: [id number]   Subjective:    Patient ID: Sandra Ellison, female    DOB: [birth year], 70 y.o.   MRN: [id number]  HPI  Patient here for ER follow up.  She is accompanied by her boyfriend.  History obtained from both of them.  Presented to ER on 08/22/15.  Reviewed note.  Present with increased cough and sob.  See note.  Was given nebulizer treatment.  Was told felt anxiety contributing.  Was given one lorazepam.  Was also given a short prednisone taper.  Reports some improvement in symptoms.  Still with increased cough.  Still sob with exertion.  Feels weak.  Not eating.  Decreased appetite.  Trying to stay hydrated.  Some nausea.  No vomiting.  Just recently had her coreg decreased to 3.125mg  bid.  She has not been taking this or her lisinopril.  Has not started the lisinopril.  Increased stress with her current medical issues.  No abdominal pain. Bowels stable.     Past Medical History  Diagnosis Date  . Chronic combined systolic and diastolic CHF (congestive heart failure) ([place])     a. 01/2012 Echo: EF 30-35%, mod conc LVH, sev inf HK, mildly dil LA, mild-mod MR, Trace TR, RVSP 40-29mmHg.    . Dilated cardiomyopathy ([place])     a. 02/2011 MV: EF 27%, anteroseptal, inf, inferolateral scar, no ischemia.  Marland Kitchen COPD (chronic obstructive pulmonary disease) ([place])   . Hx of colonic polyps   . Iron deficiency anemia     a. h/o transfusions, prev followed by heme/onc.  . Chronic kidney disease (CKD), stage II (mild)   . Pulmonary HTN ([place])   . Arthritis   . Allergy   . History of diverticulosis   . History of abnormal Pap smear     a. s/p vaginal hysterectomy  . GERD (gastroesophageal reflux disease)   . Hyperkalemia     a. 02/2015 in setting of entresto/AKI->entresto d/c'd.  . Carotid arterial disease ([place])     a. 02/2015 Carotid U/S: RICA A999333, LICA 123456.  . Tobacco abuse   . Hypertension    Past Surgical History  Procedure  Laterality Date  . Tonsillectomy  age 60  . Vaginal hysterectomy  1981    secondary to abnormal pap smear  . Orif distal radius fracture  2011    right  . Colonoscopy  10/2010  . Cardiac catheterization Left 08/05/2015    Procedure: Left Heart Cath and Coronary Angiography;  Surgeon: Minna Merritts, MD;  Location: [facility];  Service: Cardiovascular;  Laterality: Left;   Family History  Problem Relation Age of Onset  . Arthritis Mother   . Alcohol abuse Father   . Arthritis Father   . Cancer Father     throat/spine  . Breast cancer Neg Hx    Social History   Social History  . Marital Status: Single    Spouse Name: N/A  . Number of Children: 1  . Years of Education: N/A   Social History Main Topics  . Smoking status: Current Every Day Smoker -- 0.50 packs/day for 42 years    Types: Cigarettes  . Smokeless tobacco: Never Used     Comment: Down to 3 cigarettes per day  . Alcohol Use: No  . Drug Use: No  . Sexual Activity: Not Asked   Other Topics Concern  . None   Social History Narrative  Outpatient Encounter Prescriptions as of 08/29/2015  Medication Sig  . albuterol (PROVENTIL HFA;VENTOLIN HFA) 108 (90 Base) MCG/ACT inhaler Inhale 2 puffs into the lungs every 6 (six) hours as needed for wheezing or shortness of breath.  Marland Kitchen aspirin EC 81 MG tablet Take 81 mg by mouth daily.  Marland Kitchen atorvastatin (LIPITOR) 40 MG tablet Take 1 tablet (40 mg total) by mouth daily.  . carvedilol (COREG) 6.25 MG tablet Take 1 tablet (6.25 mg total) by mouth 2 (two) times daily with a meal.  . Fe Fum-FePoly-Vit C-Vit B3 (INTEGRA) 62.5-62.5-40-3 MG CAPS One per day  . furosemide (LASIX) 20 MG tablet TAKE 1 TABLET (20 MG TOTAL) BY MOUTH DAILY AS NEEDED.  Marland Kitchen gabapentin (NEURONTIN) 100 MG capsule TAKE 1 CAPSULE (100 MG TOTAL) BY MOUTH 3 (THREE) TIMES DAILY AS NEEDED.  Marland Kitchen gabapentin (NEURONTIN) 300 MG capsule TAKE ONE CAPSULE BY MOUTH 3 TIMES A DAY AS NEEDED  . hydrALAZINE (APRESOLINE) 25  MG tablet Take 25 mg by mouth 3 (three) times daily.   . Nebulizer MISC USE AS DIRECTED  . pantoprazole (PROTONIX) 40 MG tablet TAKE 1 TABLET (40 MG TOTAL) BY MOUTH DAILY.  Marland Kitchen polyethylene glycol (MIRALAX / GLYCOLAX) packet Take 17 g by mouth daily as needed for mild constipation.  Marland Kitchen tiotropium (SPIRIVA HANDIHALER) 18 MCG inhalation capsule One inhalation daily  . citalopram (CELEXA) 10 MG tablet Take 1 tablet (10 mg total) by mouth daily.  Marland Kitchen lisinopril (PRINIVIL,ZESTRIL) 10 MG tablet Take 1 tablet (10 mg total) by mouth daily. (Patient not taking: Reported on 08/29/2015)  . ondansetron (ZOFRAN ODT) 4 MG disintegrating tablet Take 1 tablet (4 mg total) by mouth 2 (two) times daily as needed for nausea or vomiting.  . predniSONE (DELTASONE) 10 MG tablet Take 4 tablets x 1 day and then decrease by 1/2 tablet per day until down to zero mg.  . [EXPIRED] albuterol (PROVENTIL) (2.5 MG/3ML) 0.083% nebulizer solution 2.5 mg    No facility-administered encounter medications on file as of 08/29/2015.    Review of Systems  Constitutional: Positive for activity change, appetite change and fatigue.  HENT: Positive for congestion and postnasal drip.   Eyes: Negative for discharge and redness.  Respiratory: Positive for cough and shortness of breath. Negative for chest tightness.   Cardiovascular: Negative for chest pain, palpitations and leg swelling.  Gastrointestinal: Positive for nausea. Negative for vomiting, abdominal pain and diarrhea.  Genitourinary: Negative for dysuria and difficulty urinating.  Musculoskeletal: Negative for myalgias and joint swelling.  Skin: Negative for color change and rash.  Neurological: Negative for dizziness, light-headedness and headaches.  Psychiatric/Behavioral: Negative for dysphoric mood and agitation.       Increased stress and anxiety with her current health issues.         Objective:     Physical Exam  Constitutional: She appears well-developed and  well-nourished. No distress.  HENT:  Nose: Nose normal.  Mouth/Throat: Oropharynx is clear and moist.  Neck: Neck supple. No thyromegaly present.  Cardiovascular: Normal rate and regular rhythm.   Pulmonary/Chest: Breath sounds normal. No respiratory distress.  Increased cough with forced expiration.  Increased wheezing.    Abdominal: Soft. Bowel sounds are normal. There is no tenderness.  Musculoskeletal: She exhibits no edema or tenderness.  Lymphadenopathy:    She has no cervical adenopathy.  Skin: No rash noted. No erythema.  Psychiatric: She has a normal mood and affect. Her behavior is normal.    BP 130/70 mmHg  Pulse 70  Temp(Src) 97.5 F (36.4 C) (Oral)  Resp 18  Ht 5\' 5"  (1.651 m)  Wt 141 lb (63.957 kg)  BMI 23.46 kg/m2  SpO2 95% Wt Readings from Last 3 Encounters:  08/29/15 141 lb (63.957 kg)  08/22/15 155 lb (70.308 kg)  08/16/15 149 lb 6.4 oz (67.767 kg)     Lab Results  Component Value Date   WBC 4.7 08/29/2015   HGB 12.4 08/29/2015   HCT 37.3 08/29/2015   PLT 196.0 08/29/2015   GLUCOSE 67* 08/29/2015   CHOL 115 07/23/2015   TRIG 102.0 07/23/2015   HDL 38.40* 07/23/2015   LDLCALC 57 07/23/2015   ALT 15 08/29/2015   AST 19 08/29/2015   NA 142 08/29/2015   K 2.9* 08/29/2015   CL 102 08/29/2015   CREATININE 1.10 08/29/2015   BUN 17 08/29/2015   CO2 27 08/29/2015   TSH 2.67 08/29/2015   INR 1.0 02/05/2012    Dg Chest 2 View  08/22/2015  CLINICAL DATA:  Shortness of breath and cough for 2 days EXAM: CHEST  2 VIEW COMPARISON:  07/18/2015 FINDINGS: Cardiac shadow is enlarged. Interstitial changes are again identified stable from the prior exam. No focal infiltrate or sizable effusion is noted. Mild hyperinflation is seen. No bony abnormality is noted. IMPRESSION: Chronic interstitial change without acute abnormality Electronically Signed   By: Inez Catalina M.D.   On: 08/22/2015 11:16       Assessment & Plan:   Problem List Items Addressed This Visit     Anxiety    Increased anxiety.  Discussed with her today.  Discussed treatment options.  Will start citalopram 10mg  q day. Follow closely.        Relevant Medications   citalopram (CELEXA) 10 MG tablet   Cardiomyopathy, ischemic    S/p heart cath.  See report.  Followed by cardiology.  Treat current pulmonary flare.  Will need to try to add in lisinopril.  Continue coreg.  She has just restarted.        Relevant Orders   Hepatic function panel (Completed)   Carotid arterial disease ([place])    Results reviewed.  See attached.  Followed by cardiology.  Varying pressures in upper extremities.  Continue risk factor modification.        CKD (chronic kidney disease), stage IV ([place])    Last renal function improved.  With the decreased po intake, recheck renal function today.  On lasix three days per week.   Not taking lisinopril.        COPD (chronic obstructive pulmonary disease) ([place])    With increased cough and congestion as outlined.  Exam as outlined.  Prednisone taper as directed.  Inhalers as directed.  Hold on abx.  Follow.        Relevant Medications   albuterol (PROVENTIL) (2.5 MG/3ML) 0.083% nebulizer solution 2.5 mg (Completed)   predniSONE (DELTASONE) 10 MG tablet   Dilated cardiomyopathy ([place])    Sees cardiology.  EF reduced as outlined.  Never started lisinopril.  Will plan to start once acute episodes resolved and eating and drinking better.  Blood pressure doing well.  Check metabolic panel.  Still taking lasix three days per week.        Essential hypertension    Blood pressure is doing well currently  Just restarted coreg this am.  Continue coreg bid.  She has not started lisinopril.  Discussed with her today.  Check renal function.  She is not eating.  Hold on restarting  right now.  Blood pressure is doing well.  Follow.  Once eating and drinking and back to her baseline, will restart.        Relevant Orders   Basic metabolic panel (Completed)   GERD  (gastroesophageal reflux disease)    On protonix.  Some nausea.  Advance diet slowly.        Relevant Medications   ondansetron (ZOFRAN ODT) 4 MG disintegrating tablet   Hyperlipidemia    On lipitor.  Follow lipid panel and liver function tests.        Nausea without vomiting    Not eating.  Poor po intake.  Is drinking.  Not orthostatic on exam.  Is weak.  zofran for nausea.  Encourage increased po fluid intake.  Bland foods.  Advance diet as tolerated.  Check metabolic panel, cbc and liver panel.  Follow up soon.  Any change or worsening symptoms, she is to be evaluated immediately.        SOB (shortness of breath)    Is weak.  Feels sob with exertion.  Treat with prednisone and inhalers as outlined.  Followed by cardiology.  Just had heart cath.  EKG obtained and revealed no acute ischemic change when compared.  Discussed ER evaluation.  Desires to treat at home first.  Follow closely.  Get her back in soon to reassess.  Continue coreg.  Just restarted.        Relevant Orders   EKG 12-Lead (Completed)   CBC with Differential/Platelet (Completed)   TSH (Completed)   Tobacco abuse    Needs to quit.  Have discussed.         Other Visit Diagnoses    Wheezing    -  Primary    Relevant Medications    albuterol (PROVENTIL) (2.5 MG/3ML) 0.083% nebulizer solution 2.5 mg (Completed)      I spent 45 minutes with the patient and more than 50% of the time was spent in consultation regarding the above.     Einar Pheasant, MD

## 2015-08-30 ENCOUNTER — Other Ambulatory Visit: Payer: Self-pay

## 2015-08-30 ENCOUNTER — Other Ambulatory Visit: Payer: Self-pay | Admitting: Internal Medicine

## 2015-08-30 ENCOUNTER — Ambulatory Visit: Payer: Commercial Managed Care - HMO

## 2015-08-30 DIAGNOSIS — R110 Nausea: Secondary | ICD-10-CM

## 2015-08-30 DIAGNOSIS — R17 Unspecified jaundice: Secondary | ICD-10-CM

## 2015-08-30 DIAGNOSIS — R630 Anorexia: Secondary | ICD-10-CM

## 2015-08-30 LAB — CBC WITH DIFFERENTIAL/PLATELET
BASOS PCT: 0.4 % (ref 0.0–3.0)
Basophils Absolute: 0 10*3/uL (ref 0.0–0.1)
Eosinophils Absolute: 0 10*3/uL (ref 0.0–0.7)
Eosinophils Relative: 1 % (ref 0.0–5.0)
HCT: 37.3 % (ref 36.0–46.0)
Hemoglobin: 12.4 g/dL (ref 12.0–15.0)
Lymphocytes Relative: 19.3 % (ref 12.0–46.0)
Lymphs Abs: 0.9 10*3/uL (ref 0.7–4.0)
MCHC: 33.2 g/dL (ref 30.0–36.0)
MCV: 88.4 fl (ref 78.0–100.0)
MONO ABS: 0.5 10*3/uL (ref 0.1–1.0)
MONOS PCT: 10 % (ref 3.0–12.0)
NEUTROS ABS: 3.2 10*3/uL (ref 1.4–7.7)
NEUTROS PCT: 69.3 % (ref 43.0–77.0)
PLATELETS: 196 10*3/uL (ref 150.0–400.0)
RBC: 4.22 Mil/uL (ref 3.87–5.11)
RDW: 16.5 % — AB (ref 11.5–15.5)
WBC: 4.7 10*3/uL (ref 4.0–10.5)

## 2015-08-30 LAB — BASIC METABOLIC PANEL
BUN: 17 mg/dL (ref 6–23)
CO2: 27 meq/L (ref 19–32)
Calcium: 8.6 mg/dL (ref 8.4–10.5)
Chloride: 102 mEq/L (ref 96–112)
Creatinine, Ser: 1.1 mg/dL (ref 0.40–1.20)
GFR: 52.3 mL/min — AB (ref >60.00)
GLUCOSE: 67 mg/dL — AB (ref 70–99)
POTASSIUM: 2.9 meq/L — AB (ref 3.5–5.1)
SODIUM: 142 meq/L (ref 135–145)

## 2015-08-30 LAB — HEPATIC FUNCTION PANEL
ALBUMIN: 3.6 g/dL (ref 3.5–5.2)
ALT: 15 U/L (ref 0–35)
AST: 19 U/L (ref 0–37)
Alkaline Phosphatase: 70 U/L (ref 39–117)
BILIRUBIN TOTAL: 1.4 mg/dL — AB (ref 0.2–1.2)
Bilirubin, Direct: 0.4 mg/dL — ABNORMAL HIGH (ref 0.0–0.3)
Total Protein: 6.7 g/dL (ref 6.0–8.3)

## 2015-08-30 LAB — TSH: TSH: 2.67 u[IU]/mL (ref 0.35–4.50)

## 2015-08-30 MED ORDER — POTASSIUM CHLORIDE ER 10 MEQ PO TBCR: EXTENDED_RELEASE_TABLET | ORAL | Status: DC

## 2015-08-30 NOTE — Progress Notes (Signed)
Orders placed for abdominal ultrasound and potassium sent in.

## 2015-08-31 ENCOUNTER — Encounter: Payer: Self-pay | Admitting: Internal Medicine

## 2015-08-31 DIAGNOSIS — R110 Nausea: Secondary | ICD-10-CM | POA: Insufficient documentation

## 2015-08-31 DIAGNOSIS — F419 Anxiety disorder, unspecified: Secondary | ICD-10-CM | POA: Insufficient documentation

## 2015-08-31 NOTE — Assessment & Plan Note (Signed)
Needs to quit.  Have discussed.

## 2015-08-31 NOTE — Assessment & Plan Note (Signed)
Last renal function improved.  With the decreased po intake, recheck renal function today.  On lasix three days per week.   Not taking lisinopril.

## 2015-08-31 NOTE — Assessment & Plan Note (Signed)
S/p heart cath.  See report.  Followed by cardiology.  Treat current pulmonary flare.  Will need to try to add in lisinopril.  Continue coreg.  She has just restarted.

## 2015-08-31 NOTE — Assessment & Plan Note (Signed)
With increased cough and congestion as outlined.  Exam as outlined.  Prednisone taper as directed.  Inhalers as directed.  Hold on abx.  Follow.

## 2015-08-31 NOTE — Assessment & Plan Note (Signed)
Not eating.  Poor po intake.  Is drinking.  Not orthostatic on exam.  Is weak.  zofran for nausea.  Encourage increased po fluid intake.  Bland foods.  Advance diet as tolerated.  Check metabolic panel, cbc and liver panel.  Follow up soon.  Any change or worsening symptoms, she is to be evaluated immediately.

## 2015-08-31 NOTE — Assessment & Plan Note (Addendum)
Is weak.  Feels sob with exertion.  Treat with prednisone and inhalers as outlined.  Followed by cardiology.  Just had heart cath.  EKG obtained and revealed no acute ischemic change when compared.  Discussed ER evaluation.  Desires to treat at home first.  Follow closely.  Get her back in soon to reassess.  Continue coreg.  Just restarted.

## 2015-08-31 NOTE — Assessment & Plan Note (Signed)
Results reviewed.  See attached.  Followed by cardiology.  Varying pressures in upper extremities.  Continue risk factor modification.

## 2015-08-31 NOTE — Assessment & Plan Note (Signed)
Blood pressure is doing well currently  Just restarted coreg this am.  Continue coreg bid.  She has not started lisinopril.  Discussed with her today.  Check renal function.  She is not eating.  Hold on restarting right now.  Blood pressure is doing well.  Follow.  Once eating and drinking and back to her baseline, will restart.

## 2015-08-31 NOTE — Assessment & Plan Note (Signed)
On protonix.  Some nausea.  Advance diet slowly.

## 2015-08-31 NOTE — Assessment & Plan Note (Signed)
Increased anxiety.  Discussed with her today.  Discussed treatment options.  Will start citalopram 10mg  q day. Follow closely.

## 2015-08-31 NOTE — Assessment & Plan Note (Signed)
Sees cardiology.  EF reduced as outlined.  Never started lisinopril.  Will plan to start once acute episodes resolved and eating and drinking better.  Blood pressure doing well.  Check metabolic panel.  Still taking lasix three days per week.

## 2015-08-31 NOTE — Assessment & Plan Note (Signed)
On lipitor.  Follow lipid panel and liver function tests.   

## 2015-09-03 ENCOUNTER — Ambulatory Visit: Payer: Self-pay | Admitting: Cardiovascular Disease

## 2015-09-03 ENCOUNTER — Ambulatory Visit: Payer: Self-pay | Admitting: Internal Medicine

## 2015-09-05 ENCOUNTER — Ambulatory Visit (INDEPENDENT_AMBULATORY_CARE_PROVIDER_SITE_OTHER): Payer: Commercial Managed Care - HMO | Admitting: Internal Medicine

## 2015-09-05 ENCOUNTER — Other Ambulatory Visit: Payer: Self-pay | Admitting: Internal Medicine

## 2015-09-05 ENCOUNTER — Encounter: Payer: Self-pay | Admitting: Internal Medicine

## 2015-09-05 VITALS — BP 100/70 | HR 82 | Temp 97.9°F | Resp 18 | Ht 65.0 in | Wt 139.2 lb

## 2015-09-05 DIAGNOSIS — J449 Chronic obstructive pulmonary disease, unspecified: Secondary | ICD-10-CM

## 2015-09-05 DIAGNOSIS — I739 Peripheral vascular disease, unspecified: Secondary | ICD-10-CM

## 2015-09-05 DIAGNOSIS — I10 Essential (primary) hypertension: Secondary | ICD-10-CM | POA: Diagnosis not present

## 2015-09-05 DIAGNOSIS — K219 Gastro-esophageal reflux disease without esophagitis: Secondary | ICD-10-CM

## 2015-09-05 DIAGNOSIS — E871 Hypo-osmolality and hyponatremia: Secondary | ICD-10-CM | POA: Diagnosis not present

## 2015-09-05 DIAGNOSIS — F419 Anxiety disorder, unspecified: Secondary | ICD-10-CM

## 2015-09-05 DIAGNOSIS — E876 Hypokalemia: Secondary | ICD-10-CM

## 2015-09-05 DIAGNOSIS — I25111 Atherosclerotic heart disease of native coronary artery with angina pectoris with documented spasm: Secondary | ICD-10-CM

## 2015-09-05 DIAGNOSIS — E785 Hyperlipidemia, unspecified: Secondary | ICD-10-CM

## 2015-09-05 DIAGNOSIS — I779 Disorder of arteries and arterioles, unspecified: Secondary | ICD-10-CM

## 2015-09-05 DIAGNOSIS — Z720 Tobacco use: Secondary | ICD-10-CM

## 2015-09-05 DIAGNOSIS — N184 Chronic kidney disease, stage 4 (severe): Secondary | ICD-10-CM

## 2015-09-05 MED ORDER — PREDNISONE 10 MG PO TABS: ORAL_TABLET | ORAL | Status: DC

## 2015-09-05 NOTE — Progress Notes (Signed)
Orders placed for met b and liver panel.   

## 2015-09-05 NOTE — Progress Notes (Signed)
Patient ID: Sandra Ellison, female   DOB: 10-Aug-1945, 70 y.o.   MRN: [id number]   Subjective:    Patient ID: Sandra Ellison, female    DOB: [birth year], 70 y.o.   MRN: [id number]  HPI  Patient here for a scheduled follow up.  Recently evaluated with increased anxiety and sob.  See last note for details.  She had a course of prednisone.  Started on citalopram.  She feels better.  Breathing better.  Has not smoked over the last several days.  Discussed remaining off cigarettes.  Still with some cough and congestion, but overall improved. She is eating and drinking better.  No nausea or vomiting.  No bowel change.  Handling stress better.     Past Medical History  Diagnosis Date  . Chronic combined systolic and diastolic CHF (congestive heart failure) ([place])     a. 01/2012 Echo: EF 30-35%, mod conc LVH, sev inf HK, mildly dil LA, mild-mod MR, Trace TR, RVSP 40-65mmHg.    . Dilated cardiomyopathy ([place])     a. 02/2011 MV: EF 27%, anteroseptal, inf, inferolateral scar, no ischemia.  Marland Kitchen COPD (chronic obstructive pulmonary disease) ([place])   . Hx of colonic polyps   . Iron deficiency anemia     a. h/o transfusions, prev followed by heme/onc.  . Chronic kidney disease (CKD), stage II (mild)   . Pulmonary HTN ([place])   . Arthritis   . Allergy   . History of diverticulosis   . History of abnormal Pap smear     a. s/p vaginal hysterectomy  . GERD (gastroesophageal reflux disease)   . Hyperkalemia     a. 02/2015 in setting of entresto/AKI->entresto d/c'd.  . Carotid arterial disease ([place])     a. 02/2015 Carotid U/S: RICA A999333, LICA 123456.  . Tobacco abuse   . Hypertension    Past Surgical History  Procedure Laterality Date  . Tonsillectomy  age 17  . Vaginal hysterectomy  1981    secondary to abnormal pap smear  . Orif distal radius fracture  2011    right  . Colonoscopy  10/2010  . Cardiac catheterization Left 08/05/2015    Procedure: Left Heart Cath and Coronary Angiography;  Surgeon:  Minna Merritts, MD;  Location: [facility];  Service: Cardiovascular;  Laterality: Left;   Family History  Problem Relation Age of Onset  . Arthritis Mother   . Alcohol abuse Father   . Arthritis Father   . Cancer Father     throat/spine  . Breast cancer Neg Hx    Social History   Social History  . Marital Status: Single    Spouse Name: N/A  . Number of Children: 1  . Years of Education: N/A   Social History Main Topics  . Smoking status: Current Every Day Smoker -- 0.50 packs/day for 42 years    Types: Cigarettes  . Smokeless tobacco: Never Used     Comment: Down to 3 cigarettes per day  . Alcohol Use: No  . Drug Use: No  . Sexual Activity: Not Asked   Other Topics Concern  . None   Social History Narrative    Outpatient Encounter Prescriptions as of 09/05/2015  Medication Sig  . albuterol (PROVENTIL HFA;VENTOLIN HFA) 108 (90 Base) MCG/ACT inhaler Inhale 2 puffs into the lungs every 6 (six) hours as needed for wheezing or shortness of breath.  Marland Kitchen aspirin EC 81 MG tablet Take 81 mg by mouth daily.  Marland Kitchen atorvastatin (LIPITOR)  40 MG tablet Take 1 tablet (40 mg total) by mouth daily.  . carvedilol (COREG) 3.125 MG tablet   . citalopram (CELEXA) 10 MG tablet Take 1 tablet (10 mg total) by mouth daily.  . Fe Fum-FePoly-Vit C-Vit B3 (INTEGRA) 62.5-62.5-40-3 MG CAPS One per day  . furosemide (LASIX) 20 MG tablet TAKE 1 TABLET (20 MG TOTAL) BY MOUTH DAILY AS NEEDED.  Marland Kitchen gabapentin (NEURONTIN) 100 MG capsule TAKE 1 CAPSULE (100 MG TOTAL) BY MOUTH 3 (THREE) TIMES DAILY AS NEEDED.  Marland Kitchen gabapentin (NEURONTIN) 300 MG capsule TAKE ONE CAPSULE BY MOUTH 3 TIMES A DAY AS NEEDED  . hydrALAZINE (APRESOLINE) 25 MG tablet Take 25 mg by mouth 3 (three) times daily.   . Nebulizer MISC USE AS DIRECTED  . pantoprazole (PROTONIX) 40 MG tablet TAKE 1 TABLET (40 MG TOTAL) BY MOUTH DAILY.  Marland Kitchen polyethylene glycol (MIRALAX / GLYCOLAX) packet Take 17 g by mouth daily as needed for mild  constipation.  . potassium chloride (K-DUR) 10 MEQ tablet Take one tablet bid x 1 day and then continue one per day.  Keep appt on Tuesday 09/03/15  . tiotropium (SPIRIVA HANDIHALER) 18 MCG inhalation capsule One inhalation daily  . [DISCONTINUED] ondansetron (ZOFRAN ODT) 4 MG disintegrating tablet Take 1 tablet (4 mg total) by mouth 2 (two) times daily as needed for nausea or vomiting.  . [DISCONTINUED] predniSONE (DELTASONE) 10 MG tablet Take 4 tablets x 1 day and then decrease by 1/2 tablet per day until down to zero mg.  . carvedilol (COREG) 6.25 MG tablet Take 1 tablet (6.25 mg total) by mouth 2 (two) times daily with a meal. (Patient not taking: Reported on 09/05/2015)  . lisinopril (PRINIVIL,ZESTRIL) 10 MG tablet Take 1 tablet (10 mg total) by mouth daily. (Patient not taking: Reported on 09/05/2015)  . predniSONE (DELTASONE) 10 MG tablet Take 4 tablets x 1 day and then decrease by 1/2 tablet per day until down to zero mg.   No facility-administered encounter medications on file as of 09/05/2015.    Review of Systems  Constitutional: Negative for unexpected weight change.       Appetite better.  Eating better.    HENT: Positive for congestion. Negative for sinus pressure.   Respiratory: Positive for cough. Negative for chest tightness and shortness of breath.   Cardiovascular: Negative for chest pain, palpitations and leg swelling.  Gastrointestinal: Negative for nausea, vomiting, abdominal pain and diarrhea.  Genitourinary: Negative for dysuria and difficulty urinating.  Musculoskeletal: Negative for back pain and joint swelling.  Skin: Negative for color change and rash.  Neurological: Negative for dizziness, light-headedness and headaches.  Psychiatric/Behavioral: Negative for dysphoric mood and agitation.       Objective:    Physical Exam  Constitutional: She appears well-developed and well-nourished. No distress.  HENT:  Nose: Nose normal.  Mouth/Throat: Oropharynx is clear  and moist.  Neck: Neck supple. No thyromegaly present.  Cardiovascular: Normal rate and regular rhythm.   Pulmonary/Chest: Breath sounds normal. No respiratory distress.  Still with some increased cough with forced expiration.  Some congestion.  Clears with coughing.  Breathing improved.    Abdominal: Soft. Bowel sounds are normal. There is no tenderness.  Musculoskeletal: She exhibits no edema or tenderness.  Lymphadenopathy:    She has no cervical adenopathy.  Skin: No rash noted. No erythema.  Psychiatric: She has a normal mood and affect. Her behavior is normal.    BP 100/70 mmHg  Pulse 82  Temp(Src) 97.9 F (36.6  C) (Oral)  Resp 18  Ht 5\' 5"  (1.651 m)  Wt 139 lb 4 oz (63.163 kg)  BMI 23.17 kg/m2  SpO2 96% Wt Readings from Last 3 Encounters:  09/05/15 139 lb 4 oz (63.163 kg)  08/29/15 141 lb (63.957 kg)  08/22/15 155 lb (70.308 kg)     Lab Results  Component Value Date   WBC 4.7 08/29/2015   HGB 12.4 08/29/2015   HCT 37.3 08/29/2015   PLT 196.0 08/29/2015   GLUCOSE 64* 09/05/2015   CHOL 115 07/23/2015   TRIG 102.0 07/23/2015   HDL 38.40* 07/23/2015   LDLCALC 57 07/23/2015   ALT 9 09/05/2015   AST 14 09/05/2015   NA 143 09/05/2015   K 3.5 09/05/2015   CL 102 09/05/2015   CREATININE 1.19 09/05/2015   BUN 18 09/05/2015   CO2 30 09/05/2015   TSH 2.67 08/29/2015   INR 1.0 02/05/2012    Dg Chest 2 View  08/22/2015  CLINICAL DATA:  Shortness of breath and cough for 2 days EXAM: CHEST  2 VIEW COMPARISON:  07/18/2015 FINDINGS: Cardiac shadow is enlarged. Interstitial changes are again identified stable from the prior exam. No focal infiltrate or sizable effusion is noted. Mild hyperinflation is seen. No bony abnormality is noted. IMPRESSION: Chronic interstitial change without acute abnormality Electronically Signed   By: Inez Catalina M.D.   On: 08/22/2015 11:16       Assessment & Plan:   Problem List Items Addressed This Visit    Anxiety    Was started on  citalopram.  She feels better.  Wants to stop.  Just started.  Will hold for now.  Follow closely.        CAD (coronary artery disease)    Sever disease including 60% left main disease on recent cardiac cath.  Discussed smoking cessation.  Has quit recently.  Continue risk factor modification.        Relevant Medications   carvedilol (COREG) 3.125 MG tablet   Carotid arterial disease ([facility])    Carotid ultrasound results as outlined in overview.  Continue risk factor modification.  Followed by cardiology.        Relevant Medications   carvedilol (COREG) 3.125 MG tablet   CKD (chronic kidney disease), stage IV ([facility])    Recent creatinine improved.  Continue to stay hydrated.  Not on lisinopril.  May be able to add in future.  Follow.        COPD (chronic obstructive pulmonary disease) ([place])    Recently treated as outlined in last note.  Cough and congestion better.  Exam as outlined.  Extend out prednisone.  Prednisone taper as directed.  Follow.  Continue inhalers.        Relevant Medications   predniSONE (DELTASONE) 10 MG tablet   Essential hypertension - Primary    Blood pressure as outlined.  Never started lisinopril.  Continue current regimen.  If plans to start lisinopril in the future, then may decrease or stop hydralazine.  Follow metabolic panel.        Relevant Medications   carvedilol (COREG) 3.125 MG tablet   GERD (gastroesophageal reflux disease)    On protonix.  Symptoms controlled.        Hyperbilirubinemia    With previous nausea and decreased appetite.  Bilirubin elevated.  Recheck liver panel today.  Ultrasound already scheduled.  Eating better.       Hyperlipidemia    Low cholesterol diet and exercise.  Follow lipid panel.  Relevant Medications   carvedilol (COREG) 3.125 MG tablet   Hypokalemia    Supplements started last week.  Recheck today.  Hold until recheck.        Tobacco abuse    Has not smoked for the last several days.  Discussed the  need to remain off cigarettes.  Follow.         Other Visit Diagnoses    Hyponatremia            Einar Pheasant, MD

## 2015-09-05 NOTE — Progress Notes (Signed)
Pre-visit discussion using our clinic review tool. No additional management support is needed unless otherwise documented below in the visit note.  

## 2015-09-06 ENCOUNTER — Ambulatory Visit
Admission: RE | Admit: 2015-09-06 | Discharge: 2015-09-06 | Disposition: A | Discharge status: Home / Self Care | Payer: Commercial Managed Care - HMO | Source: Ambulatory Visit | Attending: Internal Medicine | Admitting: Internal Medicine

## 2015-09-06 DIAGNOSIS — R630 Anorexia: Secondary | ICD-10-CM | POA: Diagnosis not present

## 2015-09-06 DIAGNOSIS — N261 Atrophy of kidney (terminal): Secondary | ICD-10-CM | POA: Diagnosis not present

## 2015-09-06 DIAGNOSIS — R17 Unspecified jaundice: Secondary | ICD-10-CM | POA: Diagnosis not present

## 2015-09-06 DIAGNOSIS — R110 Nausea: Secondary | ICD-10-CM | POA: Insufficient documentation

## 2015-09-06 DIAGNOSIS — R938 Abnormal findings on diagnostic imaging of other specified body structures: Secondary | ICD-10-CM | POA: Insufficient documentation

## 2015-09-06 DIAGNOSIS — R1011 Right upper quadrant pain: Secondary | ICD-10-CM | POA: Diagnosis not present

## 2015-09-06 LAB — BASIC METABOLIC PANEL
BUN: 18 mg/dL (ref 6–23)
CALCIUM: 8.7 mg/dL (ref 8.4–10.5)
CO2: 30 meq/L (ref 19–32)
CREATININE: 1.19 mg/dL (ref 0.40–1.20)
Chloride: 102 mEq/L (ref 96–112)
GFR: 47.76 mL/min — ABNORMAL LOW (ref >60.00)
GLUCOSE: 64 mg/dL — AB (ref 70–99)
Potassium: 3.5 mEq/L (ref 3.5–5.1)
Sodium: 143 mEq/L (ref 135–145)

## 2015-09-06 LAB — HEPATIC FUNCTION PANEL
ALK PHOS: 69 U/L (ref 39–117)
ALT: 9 U/L (ref 0–35)
AST: 14 U/L (ref 0–37)
Albumin: 3.9 g/dL (ref 3.5–5.2)
BILIRUBIN DIRECT: 0.3 mg/dL (ref 0.0–0.3)
BILIRUBIN TOTAL: 1.5 mg/dL — AB (ref 0.2–1.2)
Total Protein: 6.7 g/dL (ref 6.0–8.3)

## 2015-09-08 ENCOUNTER — Encounter: Payer: Self-pay | Admitting: Internal Medicine

## 2015-09-08 DIAGNOSIS — E876 Hypokalemia: Secondary | ICD-10-CM | POA: Insufficient documentation

## 2015-09-08 NOTE — Assessment & Plan Note (Signed)
Recent creatinine improved.  Continue to stay hydrated.  Not on lisinopril.  May be able to add in future.  Follow.

## 2015-09-08 NOTE — Assessment & Plan Note (Signed)
Blood pressure as outlined.  Never started lisinopril.  Continue current regimen.  If plans to start lisinopril in the future, then may decrease or stop hydralazine.  Follow metabolic panel.

## 2015-09-08 NOTE — Assessment & Plan Note (Signed)
Was started on citalopram.  She feels better.  Wants to stop.  Just started.  Will hold for now.  Follow closely.

## 2015-09-08 NOTE — Assessment & Plan Note (Signed)
Sever disease including 60% left main disease on recent cardiac cath.  Discussed smoking cessation.  Has quit recently.  Continue risk factor modification.

## 2015-09-08 NOTE — Assessment & Plan Note (Signed)
Carotid ultrasound results as outlined in overview.  Continue risk factor modification.  Followed by cardiology.

## 2015-09-08 NOTE — Assessment & Plan Note (Signed)
Has not smoked for the last several days.  Discussed the need to remain off cigarettes.  Follow.

## 2015-09-08 NOTE — Assessment & Plan Note (Signed)
Low cholesterol diet and exercise.  Follow lipid panel.   

## 2015-09-08 NOTE — Assessment & Plan Note (Signed)
With previous nausea and decreased appetite.  Bilirubin elevated.  Recheck liver panel today.  Ultrasound already scheduled.  Eating better.

## 2015-09-08 NOTE — Assessment & Plan Note (Signed)
Recently treated as outlined in last note.  Cough and congestion better.  Exam as outlined.  Extend out prednisone.  Prednisone taper as directed.  Follow.  Continue inhalers.

## 2015-09-08 NOTE — Assessment & Plan Note (Signed)
On protonix.  Symptoms controlled.   

## 2015-09-08 NOTE — Assessment & Plan Note (Signed)
Supplements started last week.  Recheck today.  Hold until recheck.

## 2015-09-17 ENCOUNTER — Other Ambulatory Visit: Payer: Self-pay | Admitting: Internal Medicine

## 2015-09-18 ENCOUNTER — Encounter: Payer: Self-pay | Admitting: Cardiovascular Disease

## 2015-09-18 ENCOUNTER — Telehealth: Payer: Self-pay | Admitting: Cardiovascular Disease

## 2015-09-18 ENCOUNTER — Ambulatory Visit (INDEPENDENT_AMBULATORY_CARE_PROVIDER_SITE_OTHER): Payer: Commercial Managed Care - HMO | Admitting: Cardiovascular Disease

## 2015-09-18 VITALS — BP 146/72 | HR 91 | Temp 97.3°F | Ht 65.0 in | Wt 144.5 lb

## 2015-09-18 DIAGNOSIS — I5042 Chronic combined systolic (congestive) and diastolic (congestive) heart failure: Secondary | ICD-10-CM

## 2015-09-18 DIAGNOSIS — I5022 Chronic systolic (congestive) heart failure: Secondary | ICD-10-CM | POA: Diagnosis not present

## 2015-09-18 DIAGNOSIS — I255 Ischemic cardiomyopathy: Secondary | ICD-10-CM | POA: Diagnosis not present

## 2015-09-18 DIAGNOSIS — I25111 Atherosclerotic heart disease of native coronary artery with angina pectoris with documented spasm: Secondary | ICD-10-CM

## 2015-09-18 DIAGNOSIS — F17200 Nicotine dependence, unspecified, uncomplicated: Secondary | ICD-10-CM

## 2015-09-18 DIAGNOSIS — R5381 Other malaise: Secondary | ICD-10-CM

## 2015-09-18 DIAGNOSIS — Z720 Tobacco use: Secondary | ICD-10-CM

## 2015-09-18 DIAGNOSIS — E785 Hyperlipidemia, unspecified: Secondary | ICD-10-CM

## 2015-09-18 DIAGNOSIS — E876 Hypokalemia: Secondary | ICD-10-CM

## 2015-09-18 DIAGNOSIS — R5383 Other fatigue: Secondary | ICD-10-CM

## 2015-09-18 NOTE — Assessment & Plan Note (Signed)
Etiology of her symptoms over the past several days is unclear. Unable to exclude viral etiology though no URI type symptoms. Recommended resting, hydration. Would hold hydralazine, Lasix until she feels better and eating normally. Recent lab work and imaging studies were reviewed with her. I cannot think of additional lab work needed at this time. If symptoms persist, recommended she follow-up with Dr. Nicki Reaper for further evaluation

## 2015-09-18 NOTE — Assessment & Plan Note (Signed)
Recommended compliance with her Lipitor 40 mg daily Goal LDL less than 70

## 2015-09-18 NOTE — Assessment & Plan Note (Signed)
Appears relatively euvolemic on today's visit. Suggested she not take Lasix when she is not drinking or eating normally, Could hold hydralazine in the setting of general malaise, anorexia

## 2015-09-18 NOTE — Assessment & Plan Note (Signed)
We have encouraged her to continue to work on weaning her cigarettes and smoking cessation. She will continue to work on this and does not want any assistance with chantix.    Total encounter time more than 25 minutes  Greater than 50% was spent in counseling and coordination of care with the patient  

## 2015-09-18 NOTE — Patient Instructions (Signed)
Please hold the lasix until you feel better and drinking normal  When you take lasix, Take it with potassium  Ok to hold hydralazine until you feel better  Please call us if you have new issues that need to be addressed before your next appt.  Your physician wants you to follow-up in: 6 months.  You will receive a reminder letter in the mail two months in advance. If you don't receive a letter, please call our office to schedule the follow-up appointment.

## 2015-09-18 NOTE — Telephone Encounter (Signed)
Spoke w/ pt.  She reports that she does not feel her sx are emergent, but she would like to be evaluated.  Pt sched to see Dr. Rockey Situ today @ 4:20, she will call back if she is unable to get a ride here.

## 2015-09-18 NOTE — Assessment & Plan Note (Signed)
Currently with no symptoms of angina. No further workup at this time. Continue current medication regimen. Stressed the importance of smoking cessation

## 2015-09-18 NOTE — Assessment & Plan Note (Signed)
Recommended she take a potassium pill when she takes Lasix 3 days per week

## 2015-09-18 NOTE — Telephone Encounter (Signed)
Pt calling stating she is having this weird feeling Not sure what is going on. Burning feeling in her arms. Its been going on for a little bit now Happened last week, got real sweaty  Denies CP  Please advise.

## 2015-09-18 NOTE — Progress Notes (Signed)
Patient ID: Sandra Ellison, female    DOB: [birth year], 70 y.o.   MRN: [id number]  HPI Comments: 70 year old woman with a history of peptic ulcer disease,  cardiomyopathy with history of systolic and diastolic CHF, COPD with long smoking history who continues to smoke, pulmonary hypertension seen on echocardiogram in March 2012 who presented to [facility] with increasing shortness of breath on February 11, 2011, chronic renal insufficiency, stage II, severe iron deficiency anemia, history of systolic heart failure, cardiac catheterization February 2017 for worsening ejection fraction found to have   moderate left main disease, who presents for follow-up of her systolic CHF. Decreased pulse in the left arm  In follow-up today, she reports that she does not feel well for the past several days She has general malaise, sweats, feels weak, has been laying on the couch She has been having hot flashes , sometimes feeling they're coming from inside out. Feels better putting a cold washcloth on her head and neck, denies any URI type symptoms, no muscle ache, chills, GI issues, diarrhea  Off-and-on symptoms, did not eat much today or drink much. Continues to take her Lasix 3 days per week  She has neuropathy in her legs. Other workup including CT scan of the chest 06/19/2015 showing coronary calcifications, aortic atherosclerosis. Images reviewed with her in detail Recent lab work showing potassium 2.9 up to 3.5.  Ultrasound of the abdomen showing kidney stones, sludge in the gallbladder, atrophic left kidney. Results discussed with her She continues to smoke  EKG on today's visit shows normal sinus rhythm with rate 92 bpm, left axis deviation, no significant ST or T-wave changes  Other past medical history reviewed Previously taken off entresto and ARB's for high potassium We did try bidil that she developed headaches Reports she is tolerating hydralazine Was previously on lisinopril 10 mg daily several  years ago and reports having no side effect  cardiac catheterization details :  LM lesion, 65% stenosed, Ost LAD lesion, 50% stenosed, Dist Cx to LPDA lesion, 80% stenosed, 2nd Mrg lesion, 40% stenosed.  Seen in the emergency room twice, once in 05/29/2015 for shortness of breath with cough felt to be from COPD exacerbation, given a Z-Pak and steroids Second trip to the ER 07/18/2015 for nausea vomiting felt to be a GI bug  Recent CT scan of the chest reviewed with her in detail showing severe three-vessel coronary artery disease, moderate plaquing in her aorta.  Last echocardiogram December 2015 showing ejection fraction 20-25% which was down from 30-35% in August 2013  Echo done in the hospital 05/17/2014 was reviewed with her showing ejection fraction 20-25%, mildly elevated right trigger systolic pressures  Previous hospital admission in 2012 she was found to have CHF, moderate bilateral pleural effusions with BNP of 26,000. Underlying anemia was a significant component of her presentation. Previously received procrit every other week, bone marrow biopsy, received packed red blood cells x2, iron transfusion x2,  followed by Dr. Ma Hillock.  Echocardiogram in the hospital showed ejection fraction 30-35%, mild to moderate right ventricular systolic pressure estimated at 40-50 mmHg  Echocardiogram performed February 11, 2011 shows ejection fraction less than 25% with severe global hypokinesis, apical akinesis, normal right ventricular systolic function , mild to moderate mitral valve regurgitation, normal right ventricular systolic pressures  Stress test done in the hospital on February 13 2011 shows no significant ischemia. She has a dilated cardiomyopathy with ejection fraction 27%, old scar in the anteroseptal wall, inferior and inferolateral wall. This was  a pharmacologic study         Allergies  Allergen Reactions  . Ranitidine Anaphylaxis  . Ranitidine Hcl Anaphylaxis and Swelling   . Entresto [Sacubitril-Valsartan] Other (See Comments)    Potassium issues    Outpatient Encounter Prescriptions as of 09/18/2015  Medication Sig  . albuterol (PROVENTIL HFA;VENTOLIN HFA) 108 (90 Base) MCG/ACT inhaler Inhale 2 puffs into the lungs every 6 (six) hours as needed for wheezing or shortness of breath.  Marland Kitchen aspirin EC 81 MG tablet Take 81 mg by mouth daily.  Marland Kitchen atorvastatin (LIPITOR) 40 MG tablet Take 1 tablet (40 mg total) by mouth daily.  . carvedilol (COREG) 3.125 MG tablet Take 3.125 mg by mouth 2 (two) times daily with a meal.   . citalopram (CELEXA) 10 MG tablet Take 1 tablet (10 mg total) by mouth daily.  . Fe Fum-FePoly-Vit C-Vit B3 (INTEGRA) 62.5-62.5-40-3 MG CAPS One per day  . furosemide (LASIX) 20 MG tablet TAKE 1 TABLET (20 MG TOTAL) BY MOUTH DAILY AS NEEDED.  Marland Kitchen gabapentin (NEURONTIN) 100 MG capsule TAKE 1 CAPSULE (100 MG TOTAL) BY MOUTH 3 (THREE) TIMES DAILY AS NEEDED.  Marland Kitchen gabapentin (NEURONTIN) 300 MG capsule TAKE ONE CAPSULE BY MOUTH 3 TIMES A DAY AS NEEDED  . hydrALAZINE (APRESOLINE) 25 MG tablet Take 25 mg by mouth 2 (two) times daily.   . Nebulizer MISC USE AS DIRECTED  . pantoprazole (PROTONIX) 40 MG tablet TAKE 1 TABLET (40 MG TOTAL) BY MOUTH DAILY.  Marland Kitchen polyethylene glycol (MIRALAX / GLYCOLAX) packet Take 17 g by mouth daily as needed for mild constipation.  Marland Kitchen tiotropium (SPIRIVA HANDIHALER) 18 MCG inhalation capsule One inhalation daily  . [DISCONTINUED] carvedilol (COREG) 6.25 MG tablet Take 1 tablet (6.25 mg total) by mouth 2 (two) times daily with a meal. (Patient not taking: Reported on 09/05/2015)  . [DISCONTINUED] lisinopril (PRINIVIL,ZESTRIL) 10 MG tablet Take 1 tablet (10 mg total) by mouth daily. (Patient not taking: Reported on 09/05/2015)  . [DISCONTINUED] potassium chloride (K-DUR) 10 MEQ tablet Take one tablet bid x 1 day and then continue one per day.  Keep appt on Tuesday 09/03/15 (Patient not taking: Reported on 09/18/2015)  . [DISCONTINUED] predniSONE  (DELTASONE) 10 MG tablet Take 4 tablets x 1 day and then decrease by 1/2 tablet per day until down to zero mg. (Patient not taking: Reported on 09/18/2015)   No facility-administered encounter medications on file as of 09/18/2015.    Past Medical History  Diagnosis Date  . Chronic combined systolic and diastolic CHF (congestive heart failure) ([place])     a. 01/2012 Echo: EF 30-35%, mod conc LVH, sev inf HK, mildly dil LA, mild-mod MR, Trace TR, RVSP 40-36mHg.    . Dilated cardiomyopathy ([place]     a. 02/2011 MV: EF 27%, anteroseptal, inf, inferolateral scar, no ischemia.  .Marland KitchenCOPD (chronic obstructive pulmonary disease) ([place]   . Hx of colonic polyps   . Iron deficiency anemia     a. h/o transfusions, prev followed by heme/onc.  . Chronic kidney disease (CKD), stage II (mild)   . Pulmonary HTN ([place]   . Arthritis   . Allergy   . History of diverticulosis   . History of abnormal Pap smear     a. s/p vaginal hysterectomy  . GERD (gastroesophageal reflux disease)   . Hyperkalemia     a. 02/2015 in setting of entresto/AKI->entresto d/c'd.  . Carotid arterial disease ([place]     a. 02/2015 Carotid U/S: RICA 686-57% LICA 484-69%  .Marland Kitchen  Tobacco abuse   . Hypertension     Past Surgical History  Procedure Laterality Date  . Tonsillectomy  age 61  . Vaginal hysterectomy  1981    secondary to abnormal pap smear  . Orif distal radius fracture  2011    right  . Colonoscopy  10/2010  . Cardiac catheterization Left 08/05/2015    Procedure: Left Heart Cath and Coronary Angiography;  Surgeon: Minna Merritts, MD;  Location: [facility];  Service: Cardiovascular;  Laterality: Left;    Social History  reports that she has been smoking Cigarettes.  She has a 21 pack-year smoking history. She has never used smokeless tobacco. She reports that she does not drink alcohol or use illicit drugs.  Family History family history includes Alcohol abuse in her father; Arthritis in her father and mother;  Cancer in her father. There is no history of Breast cancer.   Review of Systems  Constitutional: Positive for diaphoresis and fatigue.  Eyes: Negative.   Cardiovascular: Negative.   Gastrointestinal: Negative.   Musculoskeletal: Positive for arthralgias.  Neurological: Positive for weakness.  Hematological: Negative.   Psychiatric/Behavioral: Negative.   All other systems reviewed and are negative.   BP 146/72 mmHg  Pulse 91  Temp(Src) 97.3 F (36.3 C)  Ht 5' 5"  (1.651 m)  Wt 144 lb 8 oz (65.545 kg)  BMI 24.05 kg/m2  Physical Exam  Constitutional: She is oriented to person, place, and time. She appears well-developed and well-nourished.  HENT:  Head: Normocephalic.  Nose: Nose normal.  Mouth/Throat: Oropharynx is clear and moist.  Eyes: Conjunctivae are normal. Pupils are equal, round, and reactive to light.  Neck: Normal range of motion. Neck supple. No JVD present.  Cardiovascular: Normal rate, regular rhythm, S1 normal, S2 normal, normal heart sounds and intact distal pulses.  Exam reveals no gallop and no friction rub.   No murmur heard. Pulmonary/Chest: Effort normal. No respiratory distress. She has decreased breath sounds. She exhibits no tenderness.  Abdominal: Soft. Bowel sounds are normal. She exhibits no distension. There is no tenderness.  Musculoskeletal: Normal range of motion. She exhibits no edema or tenderness.  Lymphadenopathy:    She has no cervical adenopathy.  Neurological: She is alert and oriented to person, place, and time. Coordination normal.  Skin: Skin is warm and dry. No rash noted. No erythema.  Psychiatric: She has a normal mood and affect. Her behavior is normal. Judgment and thought content normal.    Assessment and Plan  Nursing note and vitals reviewed.

## 2015-09-23 ENCOUNTER — Telehealth: Payer: Self-pay | Admitting: Internal Medicine

## 2015-09-23 NOTE — Telephone Encounter (Signed)
I can see her at 9:00 on Friday am - block 30 min appt.

## 2015-09-23 NOTE — Telephone Encounter (Signed)
Spoke with patient and scheduled appt. Thanks

## 2015-09-23 NOTE — Telephone Encounter (Signed)
Duplicate. See other note.

## 2015-09-23 NOTE — Telephone Encounter (Signed)
Patient constantly sweating wants an office visit to see doctor scott . Please advise where to put this patient.

## 2015-09-25 ENCOUNTER — Encounter: Payer: Self-pay | Admitting: *Deleted

## 2015-09-26 DIAGNOSIS — H2513 Age-related nuclear cataract, bilateral: Secondary | ICD-10-CM | POA: Diagnosis not present

## 2015-09-27 ENCOUNTER — Ambulatory Visit (INDEPENDENT_AMBULATORY_CARE_PROVIDER_SITE_OTHER): Payer: Commercial Managed Care - HMO | Admitting: Internal Medicine

## 2015-09-27 ENCOUNTER — Encounter: Payer: Self-pay | Admitting: Internal Medicine

## 2015-09-27 VITALS — BP 140/80 | HR 82 | Temp 97.5°F | Resp 18 | Ht 65.0 in | Wt 147.5 lb

## 2015-09-27 DIAGNOSIS — I10 Essential (primary) hypertension: Secondary | ICD-10-CM

## 2015-09-27 DIAGNOSIS — F419 Anxiety disorder, unspecified: Secondary | ICD-10-CM

## 2015-09-27 DIAGNOSIS — R110 Nausea: Secondary | ICD-10-CM

## 2015-09-27 DIAGNOSIS — R630 Anorexia: Secondary | ICD-10-CM

## 2015-09-27 DIAGNOSIS — Z720 Tobacco use: Secondary | ICD-10-CM

## 2015-09-27 DIAGNOSIS — J449 Chronic obstructive pulmonary disease, unspecified: Secondary | ICD-10-CM

## 2015-09-27 DIAGNOSIS — E785 Hyperlipidemia, unspecified: Secondary | ICD-10-CM

## 2015-09-27 DIAGNOSIS — I420 Dilated cardiomyopathy: Secondary | ICD-10-CM | POA: Diagnosis not present

## 2015-09-27 DIAGNOSIS — I739 Peripheral vascular disease, unspecified: Secondary | ICD-10-CM

## 2015-09-27 DIAGNOSIS — K219 Gastro-esophageal reflux disease without esophagitis: Secondary | ICD-10-CM

## 2015-09-27 DIAGNOSIS — E876 Hypokalemia: Secondary | ICD-10-CM

## 2015-09-27 DIAGNOSIS — I779 Disorder of arteries and arterioles, unspecified: Secondary | ICD-10-CM

## 2015-09-27 LAB — HEPATIC FUNCTION PANEL
ALT: 11 U/L (ref 0–35)
AST: 12 U/L (ref 0–37)
Albumin: 3.5 g/dL (ref 3.5–5.2)
Alkaline Phosphatase: 88 U/L (ref 39–117)
BILIRUBIN DIRECT: 0.3 mg/dL (ref 0.0–0.3)
BILIRUBIN TOTAL: 1 mg/dL (ref 0.2–1.2)
TOTAL PROTEIN: 5.8 g/dL — AB (ref 6.0–8.3)

## 2015-09-27 LAB — BASIC METABOLIC PANEL
BUN: 13 mg/dL (ref 6–23)
CHLORIDE: 105 meq/L (ref 96–112)
CO2: 29 meq/L (ref 19–32)
Calcium: 9.1 mg/dL (ref 8.4–10.5)
Creatinine, Ser: 1.14 mg/dL (ref 0.40–1.20)
GFR: 50.17 mL/min — ABNORMAL LOW (ref >60.00)
GLUCOSE: 85 mg/dL (ref 70–99)
POTASSIUM: 3.7 meq/L (ref 3.5–5.1)
Sodium: 142 mEq/L (ref 135–145)

## 2015-09-27 NOTE — Progress Notes (Signed)
Pre-visit discussion using our clinic review tool. No additional management support is needed unless otherwise documented below in the visit note.  

## 2015-09-27 NOTE — Progress Notes (Signed)
Patient ID: Sandra Ellison, female   DOB: [birth year], 70 y.o.   MRN: [id number]   Subjective:    Patient ID: Sandra Ellison, female    DOB: 01/07/1946, 70 y.o.   MRN: [id number]  HPI  Patient here as a work in to discuss sweating.  She is eating better now.  Had the nausea.  Still notices some.  Breathing better.  No increased cough or congestion.  No acid reflux.  Bowels stable.  No urine change.  She reports increased sweating.  Feels like her arms are on fire.  Head sweats.  Moves up her body.  States feels like a hot flash.  Sweating - waist up.  Saw cardiology.  Did not feel coming from a cardiac origin.  They stopped her hydralazine.  She has not had an episode in the last two days.  Blood pressure better.     Past Medical History  Diagnosis Date  . Chronic combined systolic and diastolic CHF (congestive heart failure) ([place])     a. 01/2012 Echo: EF 30-35%, mod conc LVH, sev inf HK, mildly dil LA, mild-mod MR, Trace TR, RVSP 40-33mmHg.    . Dilated cardiomyopathy ([place])     a. 02/2011 MV: EF 27%, anteroseptal, inf, inferolateral scar, no ischemia.  Marland Kitchen COPD (chronic obstructive pulmonary disease) ([place])   . Hx of colonic polyps   . Iron deficiency anemia     a. h/o transfusions, prev followed by heme/onc.  . Chronic kidney disease (CKD), stage II (mild)   . Pulmonary HTN ([place])   . Arthritis   . Allergy   . History of diverticulosis   . History of abnormal Pap smear     a. s/p vaginal hysterectomy  . GERD (gastroesophageal reflux disease)   . Hyperkalemia     a. 02/2015 in setting of entresto/AKI->entresto d/c'd.  . Carotid arterial disease ([place])     a. 02/2015 Carotid U/S: RICA A999333, LICA 123456.  . Tobacco abuse   . Hypertension    Past Surgical History  Procedure Laterality Date  . Tonsillectomy  age 70  . Vaginal hysterectomy  1981    secondary to abnormal pap smear  . Orif distal radius fracture  2011    right  . Colonoscopy  10/2010  . Cardiac catheterization Left  08/05/2015    Procedure: Left Heart Cath and Coronary Angiography;  Surgeon: Minna Merritts, MD;  Location: [facility];  Service: Cardiovascular;  Laterality: Left;   Family History  Problem Relation Age of Onset  . Arthritis Mother   . Alcohol abuse Father   . Arthritis Father   . Cancer Father     throat/spine  . Breast cancer Neg Hx    Social History   Social History  . Marital Status: Single    Spouse Name: N/A  . Number of Children: 1  . Years of Education: N/A   Social History Main Topics  . Smoking status: Current Every Day Smoker -- 0.50 packs/day for 42 years    Types: Cigarettes  . Smokeless tobacco: Never Used     Comment: Down to 3 cigarettes per day  . Alcohol Use: No  . Drug Use: No  . Sexual Activity: Not Asked   Other Topics Concern  . None   Social History Narrative    Outpatient Encounter Prescriptions as of 09/27/2015  Medication Sig  . albuterol (PROVENTIL HFA;VENTOLIN HFA) 108 (90 Base) MCG/ACT inhaler Inhale 2 puffs into the lungs every 6 (  six) hours as needed for wheezing or shortness of breath.  Marland Kitchen aspirin EC 81 MG tablet Take 81 mg by mouth daily.  Marland Kitchen atorvastatin (LIPITOR) 40 MG tablet Take 1 tablet (40 mg total) by mouth daily.  . carvedilol (COREG) 3.125 MG tablet Take 3.125 mg by mouth 2 (two) times daily with a meal.   . citalopram (CELEXA) 10 MG tablet Take 1 tablet (10 mg total) by mouth daily.  . Fe Fum-FePoly-Vit C-Vit B3 (INTEGRA) 62.5-62.5-40-3 MG CAPS One per day  . furosemide (LASIX) 20 MG tablet TAKE 1 TABLET (20 MG TOTAL) BY MOUTH DAILY AS NEEDED.  Marland Kitchen gabapentin (NEURONTIN) 100 MG capsule TAKE 1 CAPSULE (100 MG TOTAL) BY MOUTH 3 (THREE) TIMES DAILY AS NEEDED.  Marland Kitchen gabapentin (NEURONTIN) 300 MG capsule TAKE ONE CAPSULE BY MOUTH 3 TIMES A DAY AS NEEDED  . Nebulizer MISC USE AS DIRECTED  . pantoprazole (PROTONIX) 40 MG tablet TAKE 1 TABLET (40 MG TOTAL) BY MOUTH DAILY.  Marland Kitchen polyethylene glycol (MIRALAX / GLYCOLAX) packet Take 17  g by mouth daily as needed for mild constipation.  Marland Kitchen tiotropium (SPIRIVA HANDIHALER) 18 MCG inhalation capsule One inhalation daily  . hydrALAZINE (APRESOLINE) 25 MG tablet Take 25 mg by mouth 2 (two) times daily. Reported on 09/27/2015   No facility-administered encounter medications on file as of 09/27/2015.    Review of Systems  Constitutional: Negative for fever.       Is eating better.    HENT: Negative for congestion and sinus pressure.   Respiratory: Negative for cough and chest tightness.        Breathing is stable.   Cardiovascular: Negative for chest pain, palpitations and leg swelling.  Gastrointestinal: Positive for nausea. Negative for vomiting and diarrhea.  Genitourinary: Negative for dysuria and difficulty urinating.  Musculoskeletal: Negative for myalgias and joint swelling.  Skin: Negative for color change and rash.  Neurological: Negative for dizziness, light-headedness and headaches.  Psychiatric/Behavioral: Negative for dysphoric mood and agitation.       Objective:    Physical Exam  Constitutional: She appears well-developed and well-nourished. No distress.  HENT:  Nose: Nose normal.  Mouth/Throat: Oropharynx is clear and moist.  Neck: Neck supple. No thyromegaly present.  Cardiovascular: Normal rate and regular rhythm.   Pulmonary/Chest: Breath sounds normal. No respiratory distress. She has no wheezes.  Abdominal: Soft. Bowel sounds are normal. There is no tenderness.  Musculoskeletal: She exhibits no edema or tenderness.  Lymphadenopathy:    She has no cervical adenopathy.  Skin: No rash noted. No erythema.  Psychiatric: She has a normal mood and affect. Her behavior is normal.    BP 140/80 mmHg  Pulse 82  Temp(Src) 97.5 F (36.4 C) (Oral)  Resp 18  Ht 5\' 5"  (1.651 m)  Wt 147 lb 8 oz (66.906 kg)  BMI 24.55 kg/m2  SpO2 92% Wt Readings from Last 3 Encounters:  09/27/15 147 lb 8 oz (66.906 kg)  09/18/15 144 lb 8 oz (65.545 kg)  09/05/15 139 lb  4 oz (63.163 kg)     Lab Results  Component Value Date   WBC 4.7 08/29/2015   HGB 12.4 08/29/2015   HCT 37.3 08/29/2015   PLT 196.0 08/29/2015   GLUCOSE 85 09/27/2015   CHOL 115 07/23/2015   TRIG 102.0 07/23/2015   HDL 38.40* 07/23/2015   LDLCALC 57 07/23/2015   ALT 11 09/27/2015   AST 12 09/27/2015   NA 142 09/27/2015   K 3.7 09/27/2015   CL 105  09/27/2015   CREATININE 1.14 09/27/2015   BUN 13 09/27/2015   CO2 29 09/27/2015   TSH 2.67 08/29/2015   INR 1.0 02/05/2012    US Abdomen Complete  09/06/2015  CLINICAL DATA:  Nausea, elevated bilirubin. Right upper quadrant pain for 2-3 weeks EXAM: ABDOMEN ULTRASOUND COMPLETE COMPARISON:  None. FINDINGS: Gallbladder: Sludge noted within the gallbladder. No stones or wall thickening. Negative sonographic Murphy's. Common bile duct: Diameter: Normal caliber, 4 mm. Liver: No focal lesion identified. Within normal limits in parenchymal echogenicity. IVC: No abnormality visualized. Pancreas: Visualized portion unremarkable. Spleen: Size and appearance within normal limits. Right Kidney: Length: 11.9 cm. Probable small 4 mm nonobstructing stone in the midpole. Echogenicity within normal limits. No mass or hydronephrosis visualized. Left Kidney: Length: Atrophic, 7.7 cm with cortical thinning and increased echotexture. No hydronephrosis. A few echogenic foci, likely nonobstructing stones, the largest 9 mm in the upper pole. Abdominal aorta: No aneurysm visualized. Other findings: None. IMPRESSION: Sludge within the gallbladder. No visible stones, wall thickening or sonographic Murphy's sign. Probable bilateral nephrolithiasis. Atrophic left kidney. Electronically Signed   By: Rolm Baptise M.D.   On: 09/06/2015 08:31       Assessment & Plan:   Problem List Items Addressed This Visit    Anxiety    On citalopram and appears to be doing better.  She feels this dose is adequate.  Follow.        Carotid arterial disease (HCC)    Carotid ultrasound  as outlined.  Continue risk factor modification.  Followed by cardiology.       COPD (chronic obstructive pulmonary disease) (HCC)    Breathing better.  Continue inhalers.        Dilated cardiomyopathy ([place])    Followed by cardiology.  Feels better on less medication.  Just saw cardiology.  Hydralazine stopped.  Follow.        Essential hypertension    Blood pressure better on less medication.  Follow pressures.  132/78 on recheck.  Follow.        Relevant Orders   Basic metabolic panel (Completed)   GERD (gastroesophageal reflux disease)    On protonix.  Stable.       Hyperbilirubinemia    Persistent elevated bilirubin and intermittent nausea.  Has the increased sweating and decreased appetite as well.  Obtain CT abdomen and pelvis.  Follow.        Hyperlipidemia   Relevant Orders   Hepatic function panel (Completed)   Hypokalemia    Replaced potassium.  Off supplements now.  Recheck potassium.        Nausea without vomiting - Primary   Relevant Orders   Hepatic function panel (Completed)   CT Abdomen Pelvis Wo Contrast   Tobacco abuse    Continues to smoke.  Have discussed the need to quit.  Follow.        Other Visit Diagnoses    Decreased appetite        Relevant Orders    CT Abdomen Pelvis Wo Contrast        Einar Pheasant, MD

## 2015-09-29 ENCOUNTER — Encounter: Payer: Self-pay | Admitting: Internal Medicine

## 2015-09-29 NOTE — Assessment & Plan Note (Signed)
Breathing better.  Continue inhalers.

## 2015-09-29 NOTE — Assessment & Plan Note (Signed)
Persistent elevated bilirubin and intermittent nausea.  Has the increased sweating and decreased appetite as well.  Obtain CT abdomen and pelvis.  Follow.

## 2015-09-29 NOTE — Assessment & Plan Note (Signed)
Blood pressure better on less medication.  Follow pressures.  132/78 on recheck.  Follow.

## 2015-09-29 NOTE — Assessment & Plan Note (Signed)
Followed by cardiology.  Feels better on less medication.  Just saw cardiology.  Hydralazine stopped.  Follow.

## 2015-09-29 NOTE — Assessment & Plan Note (Signed)
On citalopram and appears to be doing better.  She feels this dose is adequate.  Follow.

## 2015-09-29 NOTE — Assessment & Plan Note (Signed)
Replaced potassium.  Off supplements now.  Recheck potassium.

## 2015-09-29 NOTE — Assessment & Plan Note (Signed)
Carotid ultrasound as outlined.  Continue risk factor modification.  Followed by cardiology.

## 2015-09-29 NOTE — Assessment & Plan Note (Signed)
On protonix.  Stable.  

## 2015-09-29 NOTE — Assessment & Plan Note (Signed)
Continues to smoke.  Have discussed the need to quit.  Follow.   

## 2015-09-30 ENCOUNTER — Ambulatory Visit: Payer: Self-pay | Admitting: General Surgery

## 2015-10-02 ENCOUNTER — Ambulatory Visit: Payer: Self-pay

## 2015-10-16 ENCOUNTER — Other Ambulatory Visit: Payer: Self-pay | Admitting: Internal Medicine

## 2015-10-20 ENCOUNTER — Other Ambulatory Visit: Payer: Self-pay | Admitting: Internal Medicine

## 2015-10-31 ENCOUNTER — Other Ambulatory Visit: Payer: Self-pay

## 2015-10-31 DIAGNOSIS — R920 Mammographic microcalcification found on diagnostic imaging of breast: Secondary | ICD-10-CM

## 2015-11-19 ENCOUNTER — Ambulatory Visit: Payer: Self-pay | Admitting: Cardiovascular Disease

## 2015-11-23 ENCOUNTER — Other Ambulatory Visit: Payer: Self-pay | Admitting: Internal Medicine

## 2015-11-24 ENCOUNTER — Emergency Department: Payer: Commercial Managed Care - HMO

## 2015-11-24 ENCOUNTER — Observation Stay: Payer: Commercial Managed Care - HMO

## 2015-11-24 ENCOUNTER — Encounter: Payer: Self-pay | Admitting: Emergency Medicine

## 2015-11-24 ENCOUNTER — Other Ambulatory Visit: Payer: Self-pay

## 2015-11-24 ENCOUNTER — Inpatient Hospital Stay
Admission: EM | Admit: 2015-11-24 | Discharge: 2015-11-27 | DRG: 065 | Disposition: A | Discharge status: Skilled Nursing Facility | Payer: Commercial Managed Care - HMO | Attending: Internal Medicine | Admitting: Internal Medicine

## 2015-11-24 DIAGNOSIS — J449 Chronic obstructive pulmonary disease, unspecified: Secondary | ICD-10-CM | POA: Diagnosis not present

## 2015-11-24 DIAGNOSIS — R778 Other specified abnormalities of plasma proteins: Secondary | ICD-10-CM

## 2015-11-24 DIAGNOSIS — R112 Nausea with vomiting, unspecified: Secondary | ICD-10-CM | POA: Diagnosis not present

## 2015-11-24 DIAGNOSIS — F17210 Nicotine dependence, cigarettes, uncomplicated: Secondary | ICD-10-CM | POA: Diagnosis not present

## 2015-11-24 DIAGNOSIS — I638 Other cerebral infarction: Principal | ICD-10-CM | POA: Diagnosis present

## 2015-11-24 DIAGNOSIS — G459 Transient cerebral ischemic attack, unspecified: Secondary | ICD-10-CM | POA: Diagnosis not present

## 2015-11-24 DIAGNOSIS — N182 Chronic kidney disease, stage 2 (mild): Secondary | ICD-10-CM | POA: Diagnosis present

## 2015-11-24 DIAGNOSIS — I6789 Other cerebrovascular disease: Secondary | ICD-10-CM | POA: Diagnosis not present

## 2015-11-24 DIAGNOSIS — R42 Dizziness and giddiness: Secondary | ICD-10-CM | POA: Diagnosis not present

## 2015-11-24 DIAGNOSIS — R7989 Other specified abnormal findings of blood chemistry: Secondary | ICD-10-CM

## 2015-11-24 DIAGNOSIS — I5042 Chronic combined systolic (congestive) and diastolic (congestive) heart failure: Secondary | ICD-10-CM | POA: Diagnosis not present

## 2015-11-24 DIAGNOSIS — I517 Cardiomegaly: Secondary | ICD-10-CM | POA: Diagnosis not present

## 2015-11-24 DIAGNOSIS — I639 Cerebral infarction, unspecified: Secondary | ICD-10-CM | POA: Diagnosis not present

## 2015-11-24 DIAGNOSIS — Z8261 Family history of arthritis: Secondary | ICD-10-CM | POA: Diagnosis not present

## 2015-11-24 DIAGNOSIS — Z7982 Long term (current) use of aspirin: Secondary | ICD-10-CM

## 2015-11-24 DIAGNOSIS — I130 Hypertensive heart and chronic kidney disease with heart failure and stage 1 through stage 4 chronic kidney disease, or unspecified chronic kidney disease: Secondary | ICD-10-CM | POA: Diagnosis not present

## 2015-11-24 DIAGNOSIS — Z79899 Other long term (current) drug therapy: Secondary | ICD-10-CM

## 2015-11-24 DIAGNOSIS — I10 Essential (primary) hypertension: Secondary | ICD-10-CM | POA: Diagnosis not present

## 2015-11-24 DIAGNOSIS — Z720 Tobacco use: Secondary | ICD-10-CM | POA: Diagnosis not present

## 2015-11-24 LAB — LIPASE, BLOOD: Lipase: 16 U/L (ref 11–51)

## 2015-11-24 LAB — COMPREHENSIVE METABOLIC PANEL
ALBUMIN: 3.3 g/dL — AB (ref 3.5–5.0)
ALT: 19 U/L (ref 14–54)
AST: 36 U/L (ref 15–41)
Alkaline Phosphatase: 85 U/L (ref 38–126)
Anion gap: 11 (ref 5–15)
BUN: 17 mg/dL (ref 6–20)
CHLORIDE: 103 mmol/L (ref 101–111)
CO2: 24 mmol/L (ref 22–32)
CREATININE: 1.03 mg/dL — AB (ref 0.44–1.00)
Calcium: 8.4 mg/dL — ABNORMAL LOW (ref 8.9–10.3)
GFR calc Af Amer: >60 mL/min (ref >60)
GFR, EST NON AFRICAN AMERICAN: 54 mL/min — AB (ref >60)
GLUCOSE: 175 mg/dL — AB (ref 65–99)
POTASSIUM: 3.8 mmol/L (ref 3.5–5.1)
SODIUM: 138 mmol/L (ref 135–145)
Total Bilirubin: 1.1 mg/dL (ref 0.3–1.2)
Total Protein: 6.4 g/dL — ABNORMAL LOW (ref 6.5–8.1)

## 2015-11-24 LAB — ETHANOL

## 2015-11-24 LAB — DIFFERENTIAL
BASOS ABS: 0 10*3/uL (ref 0–0.1)
BASOS PCT: 1 %
EOS ABS: 1 10*3/uL — AB (ref 0–0.7)
Eosinophils Relative: 14 %
Lymphocytes Relative: 14 %
Lymphs Abs: 1 10*3/uL (ref 1.0–3.6)
MONOS PCT: 9 %
Monocytes Absolute: 0.6 10*3/uL (ref 0.2–0.9)
NEUTROS ABS: 4.5 10*3/uL (ref 1.4–6.5)
NEUTROS PCT: 62 %

## 2015-11-24 LAB — CBC
HEMATOCRIT: 41.9 % (ref 35.0–47.0)
Hemoglobin: 13.7 g/dL (ref 12.0–16.0)
MCH: 29.3 pg (ref 26.0–34.0)
MCHC: 32.7 g/dL (ref 32.0–36.0)
MCV: 89.4 fL (ref 80.0–100.0)
Platelets: 234 10*3/uL (ref 150–440)
RBC: 4.69 MIL/uL (ref 3.80–5.20)
RDW: 15.7 % — AB (ref 11.5–14.5)
WBC: 7.1 10*3/uL (ref 3.6–11.0)

## 2015-11-24 LAB — PROTIME-INR
INR: 1.1
Prothrombin Time: 14.4 seconds (ref 11.4–15.0)

## 2015-11-24 LAB — TROPONIN I
TROPONIN I: 0.06 ng/mL — AB (ref <0.031)
TROPONIN I: 0.05 ng/mL — AB (ref <0.031)
Troponin I: 0.04 ng/mL — ABNORMAL HIGH (ref <0.031)
Troponin I: 0.03 ng/mL (ref <0.031)

## 2015-11-24 LAB — APTT: APTT: 30 s (ref 24–36)

## 2015-11-24 MED ORDER — ASPIRIN 300 MG RE SUPP
150.0000 mg | Freq: Once | RECTAL | Status: AC
  Administered 2015-11-24: 150 mg via RECTAL
  Filled 2015-11-24: qty 1

## 2015-11-24 MED ORDER — STROKE: EARLY STAGES OF RECOVERY BOOK
Freq: Once | Status: AC
  Administered 2015-11-24: 14:00:00

## 2015-11-24 MED ORDER — OXYCODONE HCL 5 MG PO TABS
5.0000 mg | ORAL_TABLET | ORAL | Status: DC | PRN
  Administered 2015-11-25 – 2015-11-27 (×5): 5 mg via ORAL
  Filled 2015-11-24 (×5): qty 1

## 2015-11-24 MED ORDER — GABAPENTIN 100 MG PO CAPS
100.0000 mg | ORAL_CAPSULE | Freq: Three times a day (TID) | ORAL | Status: DC
  Administered 2015-11-24 – 2015-11-26 (×8): 100 mg via ORAL
  Filled 2015-11-24 (×8): qty 1

## 2015-11-24 MED ORDER — TIOTROPIUM BROMIDE MONOHYDRATE 18 MCG IN CAPS
18.0000 ug | ORAL_CAPSULE | Freq: Every day | RESPIRATORY_TRACT | Status: DC
  Administered 2015-11-24 – 2015-11-27 (×4): 18 ug via RESPIRATORY_TRACT
  Filled 2015-11-24: qty 5

## 2015-11-24 MED ORDER — ASPIRIN EC 81 MG PO TBEC
81.0000 mg | DELAYED_RELEASE_TABLET | Freq: Every day | ORAL | Status: DC
  Administered 2015-11-25 – 2015-11-26 (×2): 81 mg via ORAL
  Filled 2015-11-24 (×2): qty 1

## 2015-11-24 MED ORDER — MECLIZINE HCL 25 MG PO TABS: 12.5000 mg | ORAL_TABLET | Freq: Once | ORAL | Status: DC

## 2015-11-24 MED ORDER — ONDANSETRON HCL 4 MG/2ML IJ SOLN: 4.0000 mg | Freq: Four times a day (QID) | INTRAMUSCULAR | Status: DC | PRN

## 2015-11-24 MED ORDER — ASPIRIN 81 MG PO CHEW
324.0000 mg | CHEWABLE_TABLET | Freq: Once | ORAL | Status: AC
  Administered 2015-11-24: 324 mg via ORAL
  Filled 2015-11-24: qty 4

## 2015-11-24 MED ORDER — ONDANSETRON HCL 4 MG PO TABS: 4.0000 mg | ORAL_TABLET | Freq: Four times a day (QID) | ORAL | Status: DC | PRN

## 2015-11-24 MED ORDER — DIAZEPAM 2 MG PO TABS
2.0000 mg | ORAL_TABLET | Freq: Once | ORAL | Status: AC
  Administered 2015-11-24: 2 mg via ORAL
  Filled 2015-11-24: qty 1

## 2015-11-24 MED ORDER — ONDANSETRON HCL 4 MG/2ML IJ SOLN
4.0000 mg | Freq: Four times a day (QID) | INTRAMUSCULAR | Status: DC | PRN
  Administered 2015-11-24 – 2015-11-26 (×7): 4 mg via INTRAVENOUS
  Filled 2015-11-24 (×8): qty 2

## 2015-11-24 MED ORDER — MORPHINE SULFATE (PF) 2 MG/ML IV SOLN: 2.0000 mg | INTRAVENOUS | Status: DC | PRN

## 2015-11-24 MED ORDER — ASPIRIN EC 81 MG PO TBEC: 81.0000 mg | DELAYED_RELEASE_TABLET | Freq: Every day | ORAL | Status: DC

## 2015-11-24 MED ORDER — CARVEDILOL 6.25 MG PO TABS
6.2500 mg | ORAL_TABLET | Freq: Two times a day (BID) | ORAL | Status: DC
  Administered 2015-11-25 – 2015-11-27 (×6): 6.25 mg via ORAL
  Filled 2015-11-24 (×6): qty 1

## 2015-11-24 MED ORDER — ONDANSETRON HCL 4 MG/2ML IJ SOLN
INTRAMUSCULAR | Status: AC
  Administered 2015-11-24: 4 mg via INTRAVENOUS
  Filled 2015-11-24: qty 2

## 2015-11-24 MED ORDER — IPRATROPIUM-ALBUTEROL 0.5-2.5 (3) MG/3ML IN SOLN
3.0000 mL | Freq: Once | RESPIRATORY_TRACT | Status: AC
  Administered 2015-11-24: 3 mL via RESPIRATORY_TRACT
  Filled 2015-11-24: qty 3

## 2015-11-24 MED ORDER — SODIUM CHLORIDE 0.9% FLUSH
3.0000 mL | Freq: Two times a day (BID) | INTRAVENOUS | Status: DC
  Administered 2015-11-24 – 2015-11-27 (×7): 3 mL via INTRAVENOUS

## 2015-11-24 MED ORDER — ONDANSETRON HCL 4 MG/2ML IJ SOLN
4.0000 mg | Freq: Once | INTRAMUSCULAR | Status: AC
  Administered 2015-11-24: 4 mg via INTRAVENOUS

## 2015-11-24 MED ORDER — ACETAMINOPHEN 325 MG PO TABS
650.0000 mg | ORAL_TABLET | Freq: Four times a day (QID) | ORAL | Status: DC | PRN
  Administered 2015-11-26: 650 mg via ORAL
  Filled 2015-11-24: qty 2

## 2015-11-24 MED ORDER — PANTOPRAZOLE SODIUM 40 MG PO TBEC
40.0000 mg | DELAYED_RELEASE_TABLET | Freq: Every day | ORAL | Status: DC
  Administered 2015-11-25 – 2015-11-27 (×3): 40 mg via ORAL
  Filled 2015-11-24 (×4): qty 1

## 2015-11-24 MED ORDER — ACETAMINOPHEN 650 MG RE SUPP: 650.0000 mg | Freq: Four times a day (QID) | RECTAL | Status: DC | PRN

## 2015-11-24 MED ORDER — ATORVASTATIN CALCIUM 20 MG PO TABS
40.0000 mg | ORAL_TABLET | Freq: Every day | ORAL | Status: DC
  Administered 2015-11-24 – 2015-11-27 (×4): 40 mg via ORAL
  Filled 2015-11-24 (×4): qty 2

## 2015-11-24 MED ORDER — PROCHLORPERAZINE EDISYLATE 5 MG/ML IJ SOLN
10.0000 mg | Freq: Four times a day (QID) | INTRAMUSCULAR | Status: DC | PRN
  Administered 2015-11-24 – 2015-11-26 (×5): 10 mg via INTRAVENOUS
  Filled 2015-11-24 (×7): qty 2

## 2015-11-24 MED ORDER — DIAZEPAM 5 MG/ML IJ SOLN
2.5000 mg | Freq: Once | INTRAMUSCULAR | Status: AC
  Administered 2015-11-25: 2.5 mg via INTRAVENOUS
  Filled 2015-11-24: qty 2

## 2015-11-24 MED ORDER — MECLIZINE HCL 25 MG PO TABS: 12.5000 mg | ORAL_TABLET | Freq: Three times a day (TID) | ORAL | Status: DC | PRN

## 2015-11-24 MED ORDER — ONDANSETRON HCL 4 MG PO TABS
4.0000 mg | ORAL_TABLET | ORAL | Status: DC | PRN
Start: 2015-11-24 — End: 2015-11-27

## 2015-11-24 MED ORDER — CITALOPRAM HYDROBROMIDE 20 MG PO TABS
10.0000 mg | ORAL_TABLET | Freq: Every day | ORAL | Status: DC
  Administered 2015-11-24 – 2015-11-27 (×4): 10 mg via ORAL
  Filled 2015-11-24 (×4): qty 1

## 2015-11-24 MED ORDER — ENOXAPARIN SODIUM 40 MG/0.4ML ~~LOC~~ SOLN
40.0000 mg | SUBCUTANEOUS | Status: DC
  Administered 2015-11-24 – 2015-11-27 (×4): 40 mg via SUBCUTANEOUS
  Filled 2015-11-24 (×4): qty 0.4

## 2015-11-24 MED ORDER — LISINOPRIL 10 MG PO TABS
10.0000 mg | ORAL_TABLET | Freq: Every day | ORAL | Status: DC
  Administered 2015-11-25 – 2015-11-27 (×3): 10 mg via ORAL
  Filled 2015-11-24 (×3): qty 1

## 2015-11-24 MED ORDER — IPRATROPIUM-ALBUTEROL 0.5-2.5 (3) MG/3ML IN SOLN
3.0000 mL | RESPIRATORY_TRACT | Status: DC | PRN
  Administered 2015-11-25 – 2015-11-27 (×3): 3 mL via RESPIRATORY_TRACT
  Filled 2015-11-24 (×3): qty 3

## 2015-11-24 MED ORDER — HYDRALAZINE HCL 25 MG PO TABS
25.0000 mg | ORAL_TABLET | Freq: Two times a day (BID) | ORAL | Status: DC
  Administered 2015-11-25 – 2015-11-27 (×4): 25 mg via ORAL
  Filled 2015-11-24 (×6): qty 1

## 2015-11-24 NOTE — Care Management Obs Status (Signed)
[place] NOTIFICATION   Patient Details  Name: Sandra Ellison MRN: [id number] Date of Birth: 31-Aug-1945   Medicare Observation Status Notification Given:  Yes (dizziness)    Ival Bible, RN 11/24/2015, 8:41 AM

## 2015-11-24 NOTE — Progress Notes (Signed)
Pt admitted today from the ED. NIH 1 due to drowsiness, but pt easily aroused. Denies pain. Zofran given for n/v with some improvement. Pt sleeping between care, generalized weakness. Pt unable to tolerate [place] and ECHO today. Compazine ordered and given for nausea with with improvement.

## 2015-11-24 NOTE — H&P (Signed)
[facility] NAME: Sandra Ellison    MR#:  [id number]  DATE OF BIRTH:  1946/05/06   DATE OF ADMISSION:  11/24/2015  PRIMARY CARE PHYSICIAN: Einar Pheasant, MD   REQUESTING/REFERRING PHYSICIAN: quale  CHIEF COMPLAINT:   Chief Complaint  Patient presents with  . Dizziness  . Nausea    HISTORY OF PRESENT ILLNESS:  Sandra Ellison  is a 70 y.o. female with a known history of COPD and non-option requiring, combined systolic and diastolic congestive heart failure who is presenting with acute onset dizziness and nausea. She describes acute setting around 6 AM this morning at rest room spinning sensation with nausea. Denies any focal weakness further symptomatology. Called EMS with the above findings received Zofran with transient relief. On presentation to the ER code stroke called. Still has symptoms of room spinning sensation and nausea with intermittent nonbloody nonbilious emesis. Denies shortness of breath cough fever chills further symptomatology.  PAST MEDICAL HISTORY:   Past Medical History  Diagnosis Date  . Chronic combined systolic and diastolic CHF (congestive heart failure) ([place])     a. 01/2012 Echo: EF 30-35%, mod conc LVH, sev inf HK, mildly dil LA, mild-mod MR, Trace TR, RVSP 40-30mmHg.    . Dilated cardiomyopathy ([place])     a. 02/2011 MV: EF 27%, anteroseptal, inf, inferolateral scar, no ischemia.  Marland Kitchen COPD (chronic obstructive pulmonary disease) ([place])   . Hx of colonic polyps   . Iron deficiency anemia     a. h/o transfusions, prev followed by heme/onc.  . Chronic kidney disease (CKD), stage II (mild)   . Pulmonary HTN ([place])   . Arthritis   . Allergy   . History of diverticulosis   . History of abnormal Pap smear     a. s/p vaginal hysterectomy  . GERD (gastroesophageal reflux disease)   . Hyperkalemia     a. 02/2015 in setting of entresto/AKI->entresto d/c'd.  . Carotid arterial disease ([place])     a. 02/2015  Carotid U/S: RICA A999333, LICA 123456.  . Tobacco abuse   . Hypertension     PAST SURGICAL HISTORY:   Past Surgical History  Procedure Laterality Date  . Tonsillectomy  age 73  . Vaginal hysterectomy  1981    secondary to abnormal pap smear  . Orif distal radius fracture  2011    right  . Colonoscopy  10/2010  . Cardiac catheterization Left 08/05/2015    Procedure: Left Heart Cath and Coronary Angiography;  Surgeon: Minna Merritts, MD;  Location: [facility];  Service: Cardiovascular;  Laterality: Left;    SOCIAL HISTORY:   Social History  Substance Use Topics  . Smoking status: Current Every Day Smoker -- 0.50 packs/day for 42 years    Types: Cigarettes  . Smokeless tobacco: Never Used     Comment: Down to 3 cigarettes per day  . Alcohol Use: No    FAMILY HISTORY:   Family History  Problem Relation Age of Onset  . Arthritis Mother   . Alcohol abuse Father   . Arthritis Father   . Cancer Father     throat/spine  . Breast cancer Neg Hx     DRUG ALLERGIES:   Allergies  Allergen Reactions  . Ranitidine Anaphylaxis  . Ranitidine Hcl Anaphylaxis and Swelling  . Entresto [Sacubitril-Valsartan] Other (See Comments)    Potassium issues    REVIEW OF SYSTEMS:  REVIEW OF SYSTEMS:  CONSTITUTIONAL: Denies fevers, chills,  fatigue, weakness.  EYES: Denies blurred vision, double vision, or eye pain.  EARS, NOSE, THROAT: Denies tinnitus, ear pain, hearing loss.  RESPIRATORY: denies cough, shortness of breath, wheezing  CARDIOVASCULAR: Denies chest pain, palpitations, edema.  GASTROINTESTINAL: Positive nausea, vomiting, denies diarrhea, abdominal pain.  GENITOURINARY: Denies dysuria, hematuria.  ENDOCRINE: Denies nocturia or thyroid problems. HEMATOLOGIC AND LYMPHATIC: Denies easy bruising or bleeding.  SKIN: Denies rash or lesions.  MUSCULOSKELETAL: Denies pain in neck, back, shoulder, knees, hips, or further arthritic symptoms.  NEUROLOGIC: Denies paralysis,  paresthesias.  PSYCHIATRIC: Denies anxiety or depressive symptoms. Otherwise full review of systems performed by me is negative.   MEDICATIONS AT HOME:   Prior to Admission medications   Medication Sig Start Date End Date Taking? Authorizing Provider  albuterol (PROVENTIL) (2.5 MG/3ML) 0.083% nebulizer solution Take 3 mLs by nebulization every 4 (four) hours as needed. For wheezing/shortness of breath. 11/11/15  Yes Historical Provider, MD  carvedilol (COREG) 6.25 MG tablet Take 6.25 mg by mouth 2 (two) times daily with a meal. 11/10/15  Yes Historical Provider, MD  lisinopril (PRINIVIL,ZESTRIL) 10 MG tablet Take 10 mg by mouth daily. 11/10/15  Yes Historical Provider, MD  albuterol (PROVENTIL HFA;VENTOLIN HFA) 108 (90 Base) MCG/ACT inhaler Inhale 2 puffs into the lungs every 6 (six) hours as needed for wheezing or shortness of breath. 07/23/15   Dale Chemung, MD  aspirin EC 81 MG tablet Take 81 mg by mouth daily.    Historical Provider, MD  atorvastatin (LIPITOR) 40 MG tablet Take 1 tablet (40 mg total) by mouth daily. 03/21/15   Ok Anis, NP  citalopram (CELEXA) 10 MG tablet TAKE 1 TABLET (10 MG TOTAL) BY MOUTH DAILY. 10/21/15   Dale Chimayo, MD  Fe Fum-FePoly-Vit C-Vit B3 (INTEGRA) 62.5-62.5-40-3 MG CAPS One per day 06/12/15   Dale Hidalgo, MD  furosemide (LASIX) 20 MG tablet TAKE 1 TABLET (20 MG TOTAL) BY MOUTH DAILY AS NEEDED. 08/26/15   Iran Ouch, MD  gabapentin (NEURONTIN) 100 MG capsule TAKE 1 CAPSULE (100 MG TOTAL) BY MOUTH 3 (THREE) TIMES DAILY AS NEEDED. 11/23/15   Dale Sawyer, MD  hydrALAZINE (APRESOLINE) 25 MG tablet Take 25 mg by mouth 2 (two) times daily. Reported on 09/27/2015 04/18/15   Historical Provider, MD  Nebulizer MISC USE AS DIRECTED 06/11/15   Dale Mansfield, MD  pantoprazole (PROTONIX) 40 MG tablet TAKE 1 TABLET (40 MG TOTAL) BY MOUTH DAILY. 07/29/15   Dale [place], MD  polyethylene glycol (MIRALAX / GLYCOLAX) packet Take 17 g by mouth daily as needed for  mild constipation.    Historical Provider, MD  tiotropium (SPIRIVA HANDIHALER) 18 MCG inhalation capsule One inhalation daily 07/01/15   Dale [place], MD      VITAL SIGNS:  Blood pressure 150/51, pulse 99, temperature 97.5 F (36.4 C), temperature source Oral, resp. rate 23, height 5\' 5"  (1.651 m), weight 139 lb 14.4 oz (63.458 kg), SpO2 94 %.  PHYSICAL EXAMINATION:  VITAL SIGNS: Filed Vitals:   11/24/15 0730 11/24/15 0800  BP: 159/73 150/51  Pulse: 99   Temp:    Resp: 34 23   GENERAL:69 y.o.female currently in no acute distress.  HEAD: Normocephalic, atraumatic.  EYES: Pupils equal, round, reactive to light. Extraocular muscles intact. No scleral icterus.  MOUTH: Moist mucosal membrane. Dentition intact. No abscess noted.  EAR, NOSE, THROAT: Clear without exudates. No external lesions.  NECK: Supple. No thyromegaly. No nodules. No JVD.  PULMONARY: Clear to ascultation, without wheeze rails and positive left-sided  rhonci. No use of accessory muscles, Good respiratory effort. good air entry bilaterally CHEST: Nontender to palpation.  CARDIOVASCULAR: S1 and S2. Regular rate and rhythm. No murmurs, rubs, or gallops. No edema. Pedal pulses 2+ bilaterally.  GASTROINTESTINAL: Soft, nontender, nondistended. No masses. Positive bowel sounds. No hepatosplenomegaly.  MUSCULOSKELETAL: No swelling, clubbing, or edema. Range of motion full in all extremities.  NEUROLOGIC: Cranial nerves II through XII are intact. No gross focal neurological deficits. Sensation intact. Reflexes intact. Strength 4/5 all extremities including proximal and distal flexion extension pronator drift within normal limits no nystagmus present SKIN: No ulceration, lesions, rashes, or cyanosis. Skin warm and dry. Turgor intact.  PSYCHIATRIC: Mood, affect within normal limits. The patient is awake, alert and oriented x 3. Insight, judgment intact.    LABORATORY PANEL:   CBC  Recent Labs Lab 11/24/15 0712  WBC 7.1    HGB 13.7  HCT 41.9  PLT 234   ------------------------------------------------------------------------------------------------------------------  Chemistries   Recent Labs Lab 11/24/15 0712  NA 138  K 3.8  CL 103  CO2 24  GLUCOSE 175*  BUN 17  CREATININE 1.03*  CALCIUM 8.4*  AST 36  ALT 19  ALKPHOS 85  BILITOT 1.1   ------------------------------------------------------------------------------------------------------------------  Cardiac Enzymes  Recent Labs Lab 11/24/15 0712  TROPONINI 0.06*   ------------------------------------------------------------------------------------------------------------------  RADIOLOGY:  Dg Abd 1 View  11/24/2015  CLINICAL DATA:  Nausea, vomiting. EXAM: ABDOMEN - 1 VIEW COMPARISON:  None. FINDINGS: The bowel gas pattern is normal. No radio-opaque calculi or other significant radiographic abnormality are seen. IMPRESSION: There is no definite evidence of bowel obstruction or ileus. Electronically Signed   By: Marijo Conception, M.D.   On: 11/24/2015 08:16   Ct Head Wo Contrast  11/24/2015  CLINICAL DATA:  Dizziness. EXAM: CT HEAD WITHOUT CONTRAST TECHNIQUE: Contiguous axial images were obtained from the base of the skull through the vertex without intravenous contrast. COMPARISON:  None. FINDINGS: Bony calvarium appears intact. Minimal chronic ischemic white matter disease is noted. No mass effect or midline shift is noted. Ventricular size is within normal limits. There is no evidence of mass lesion, hemorrhage or acute infarction. IMPRESSION: Minimal chronic ischemic white matter disease. No acute intracranial abnormality seen. These results were called by telephone at the time of interpretation on 11/24/2015 at 7:33 am to Dr. Delman Kitten , who verbally acknowledged these results. Electronically Signed   By: Marijo Conception, M.D.   On: 11/24/2015 07:35   Dg Chest Port 1 View  11/24/2015  CLINICAL DATA:  Dizziness. EXAM: PORTABLE CHEST 1 VIEW  COMPARISON:  Radiograph of August 22, 2015. FINDINGS: Stable cardiomegaly. No pneumothorax is noted. Minimal right pleural effusion is noted which is increased compared to prior exam. Stable interstitial densities are noted throughout both lungs which may simply represent scarring, but superimposed edema or inflammation cannot be excluded. Bony thorax is unremarkable. IMPRESSION: Stable cardiomegaly and diffuse interstitial densities compared to prior exam as described above. Minimal right pleural effusion is noted which is increased compared to prior exam. Electronically Signed   By: Marijo Conception, M.D.   On: 11/24/2015 08:15    EKG:   Orders placed or performed during the hospital encounter of 11/24/15  . ED EKG  . ED EKG    IMPRESSION AND PLAN:   70 year old Caucasian female history of COPD non-oxygen requiring as well as congestive heart failure combined systolic and diastolic presenting with vertiginous symptoms.  1. Vertigo/TIA: Rule out stroke- Initiate aspirin/statin therapy, stroke protocol/precautions,  place on telemetry, trend cardiac enzymes, check MRI brain, complete echocardiogram, lipid panel, hemoglobin A1c, bilateral carotid Doppler, 2.COPD unspecified: Supplemental oxygen as required, breathing treatments continue home medication 3. Essential hypertension Coreg lisinopril hydralazine 4. Tobacco abuse: Counseling time spent 4 minutes 5. Venous thrombi embolism prophylactic: Lovenox      All the records are reviewed and case discussed with ED provider. Management plans discussed with the patient, family and they are in agreement.  CODE STATUS: Full  TOTAL TIME TAKING CARE OF THIS PATIENT: 36 minutes.    Misa Fedorko,  Karenann Cai.D on 11/24/2015 at 8:22 AM  Between 7am to 6pm - Pager - [phone]  After 6pm: House Pager: - [phone]  [facility]  Office  [phone]  CC: Primary care physician; Einar Pheasant, MD

## 2015-11-24 NOTE — ED Provider Notes (Signed)
[facility] Emergency Department Provider Note  ____________________________________________  Time seen: Approximately 7:13 AM  I have reviewed the triage vital signs and the nursing notes.   HISTORY  Chief Complaint Dizziness and Nausea   HPI Sandra Ellison is a 70 y.o. female history of congestive heart failure, COPD, chronic kidney disease.  Patient presents today with sudden onset at about 6 AM of severe sensation of spinning. She was seated in her chair eating food when she suddenly started feeling as though she could not control her balance. She is on and able to walk and began having severe vomiting. She reports that even when she closes her eyes she continues to feel as though the world is spinning around her.  She has never had these symptoms before. She denies abdominal pain but just states that she is so nauseated and cannot stop vomiting. No chest pain. No shortness of breath. No fevers chills or recent illness.  She denies trouble speaking. She has not noticed weakness in any one arm or leg but just feels like her whole body is weak from throwing up. She denies any new numbness, no tingling. No trouble speaking. She does not have a headache.   Past Medical History  Diagnosis Date  . Chronic combined systolic and diastolic CHF (congestive heart failure) ([place])     a. 01/2012 Echo: EF 30-35%, mod conc LVH, sev inf HK, mildly dil LA, mild-mod MR, Trace TR, RVSP 40-22mmHg.    . Dilated cardiomyopathy ([place])     a. 02/2011 MV: EF 27%, anteroseptal, inf, inferolateral scar, no ischemia.  Marland Kitchen COPD (chronic obstructive pulmonary disease) ([place])   . Hx of colonic polyps   . Iron deficiency anemia     a. h/o transfusions, prev followed by heme/onc.  . Chronic kidney disease (CKD), stage II (mild)   . Pulmonary HTN ([place])   . Arthritis   . Allergy   . History of diverticulosis   . History of abnormal Pap smear     a. s/p vaginal hysterectomy  . GERD  (gastroesophageal reflux disease)   . Hyperkalemia     a. 02/2015 in setting of entresto/AKI->entresto d/c'd.  . Carotid arterial disease ([place])     a. 02/2015 Carotid U/S: RICA [id number], LICA [id number].  . Tobacco abuse   . Hypertension     Patient Active Problem List   Diagnosis Date Noted  . TIA (transient ischemic attack) 11/24/2015  . Anxiety 08/31/2015  . Congestive dilated cardiomyopathy ([place])   . Cardiomyopathy, ischemic 07/24/2015  . Foot swelling 06/15/2015  . Chronic combined systolic and diastolic CHF (congestive heart failure) ([place])   . Dilated cardiomyopathy ([place])   . Carotid arterial disease ([place])   . Tobacco abuse   . Essential hypertension 03/17/2015  . CKD (chronic kidney disease), stage IV ([place]) 03/06/2015  . Abnormal mammogram 02/23/2015  . Encounter for screening colonoscopy 01/26/2015  . Health care maintenance 08/12/2014  . Chronic systolic CHF (congestive heart failure) ([place]) 05/21/2014  . Microcalcifications of the breast 01/24/2014  . Knee pain, bilateral 07/22/2013  . Pulmonary nodule 05/20/2013  . Insomnia 03/08/2013  . Left knee pain 03/08/2013  . Ulnar nerve injury 03/07/2013  . GERD (gastroesophageal reflux disease) 02/28/2013  . History of colonic polyps 02/28/2013  . Diverticulosis 02/28/2013  . Anemia 02/28/2013  . COPD (chronic obstructive pulmonary disease) ([place]) 02/28/2013  . Pulmonary hypertension ([place]) 02/28/2013  . Nonischemic cardiomyopathy ([place]) 02/26/2012  . Carotid bruit 02/26/2012  . SOB (shortness  of breath) 03/03/2011  . Smoking 03/03/2011  . Hyperlipidemia 03/03/2011  . CAD (coronary artery disease) 03/03/2011    Past Surgical History  Procedure Laterality Date  . Tonsillectomy  age 46  . Vaginal hysterectomy  1981    secondary to abnormal pap smear  . Orif distal radius fracture  2011    right  . Colonoscopy  10/2010  . Cardiac catheterization Left 08/05/2015    Procedure: Left Heart Cath and Coronary Angiography;  Surgeon:  Minna Merritts, MD;  Location: [place] [facility];  Service: Cardiovascular;  Laterality: Left;    Current Outpatient Rx  Name  Route  Sig  Dispense  Refill  . albuterol (PROVENTIL HFA;VENTOLIN HFA) 108 (90 Base) MCG/ACT inhaler   Inhalation   Inhale 2 puffs into the lungs every 6 (six) hours as needed for wheezing or shortness of breath.   1 Inhaler   3   . albuterol (PROVENTIL) (2.5 MG/3ML) 0.083% nebulizer solution   Nebulization   Take 3 mLs by nebulization every 4 (four) hours as needed. For wheezing/shortness of breath.         Marland Kitchen aspirin EC 81 MG tablet   Oral   Take 81 mg by mouth daily.         . carvedilol (COREG) 6.25 MG tablet   Oral   Take 6.25 mg by mouth 2 (two) times daily with a meal.      3   . lisinopril (PRINIVIL,ZESTRIL) 10 MG tablet   Oral   Take 10 mg by mouth daily.      3   . pantoprazole (PROTONIX) 40 MG tablet      TAKE 1 TABLET (40 MG TOTAL) BY MOUTH DAILY.   30 tablet   11   . polyethylene glycol (MIRALAX / GLYCOLAX) packet   Oral   Take 17 g by mouth daily as needed for mild constipation.         Marland Kitchen tiotropium (SPIRIVA HANDIHALER) 18 MCG inhalation capsule      One inhalation daily Patient taking differently: Place 18 mcg into inhaler and inhale daily.    30 capsule   5   . atorvastatin (LIPITOR) 40 MG tablet   Oral   Take 1 tablet (40 mg total) by mouth daily.   30 tablet   11   . citalopram (CELEXA) 10 MG tablet      TAKE 1 TABLET (10 MG TOTAL) BY MOUTH DAILY.   30 tablet   3   . Fe Fum-FePoly-Vit C-Vit B3 (INTEGRA) 62.5-62.5-40-3 MG CAPS      One per day   30 capsule   2   . furosemide (LASIX) 20 MG tablet      TAKE 1 TABLET (20 MG TOTAL) BY MOUTH DAILY AS NEEDED.   30 tablet   6   . gabapentin (NEURONTIN) 100 MG capsule      TAKE 1 CAPSULE (100 MG TOTAL) BY MOUTH 3 (THREE) TIMES DAILY AS NEEDED.   90 capsule   1     Allergies Ranitidine; Ranitidine hcl; and Entresto  Family History  Problem  Relation Age of Onset  . Arthritis Mother   . Alcohol abuse Father   . Arthritis Father   . Cancer Father     throat/spine  . Breast cancer Neg Hx     Social History Social History  Substance Use Topics  . Smoking status: Current Every Day Smoker -- 0.50 packs/day for 42 years  Types: Cigarettes  . Smokeless tobacco: Never Used     Comment: Down to 3 cigarettes per day  . Alcohol Use: No    Review of Systems Constitutional: No fever/chills Eyes: Feels like her vision is blurred, the whole world is hard to see ENT: No sore throat. Cardiovascular: Denies chest pain. Respiratory: Denies shortness of breath. Gastrointestinal: No abdominal pain.  No diarrhea.  No constipation. Genitourinary: Negative for dysuria. Musculoskeletal: Negative for back pain. Skin: Negative for rash. Neurological: Negative for headaches, focal weakness or numbness.  10-point ROS otherwise negative.  ____________________________________________   PHYSICAL EXAM:  VITAL SIGNS: ED Triage Vitals  Enc Vitals Group     BP 11/24/15 0659 152/70 mmHg     Pulse Rate 11/24/15 0659 96     Resp 11/24/15 0659 37     Temp 11/24/15 0659 97.5 F (36.4 C)     Temp Source 11/24/15 0659 Oral     SpO2 11/24/15 0659 88 %     Weight 11/24/15 0659 139 lb 14.4 oz (63.458 kg)     Height 11/24/15 0659 5\' 5"  (1.651 m)     Head Cir --      Peak Flow --      Pain Score 11/24/15 0700 0     Pain Loc --      Pain Edu? --      Excl. in [place]? --    Constitutional: Alert and oriented. Well appearing and in no acute distress. Eyes: Conjunctivae are normal. PERRL. EOMI. Head: Atraumatic. Nose: No congestion/rhinnorhea. Mouth/Throat: Mucous membranes are moist.  Oropharynx non-erythematous. Neck: No stridor.   Cardiovascular: Normal rate, regular rhythm. Grossly normal heart sounds.  Good peripheral circulation. Respiratory: Normal respiratory effort.  No retractions. Lungs CTAB. Gastrointestinal: Soft and nontender.  No distention. No abdominal bruits. No CVA tenderness. Musculoskeletal: No lower extremity tenderness nor edema.  No joint effusions. Neurologic:  Normal speech and language. No gross focal neurologic deficits are appreciated.   NIH score equals 0, performed by me at bedside. The patient has no pronator drift. The patient has normal cranial nerve exam. Extraocular movements are normal. Visual fields are normal. Patient has 5 out of 5 strength in all extremities. There is no numbness or gross, acute sensory abnormality in the extremities bilaterally. No speech disturbance. No dysarthria. No aphasia. No ataxia, however the patient is noted to have non-extinguishing nystagmus when looking into left field of vision. Normal finger nose finger bilat. Patient speaking in full and clear sentences.   Skin:  Skin is warm, dry and intact. No rash noted. Psychiatric: Mood and affect are normal. Speech and behavior are normal.  ____________________________________________   LABS (all labs ordered are listed, but only abnormal results are displayed)  Labs Reviewed  CBC - Abnormal; Notable for the following:    RDW 15.7 (*)    All other components within normal limits  DIFFERENTIAL - Abnormal; Notable for the following:    Eosinophils Absolute 1.0 (*)    All other components within normal limits  COMPREHENSIVE METABOLIC PANEL - Abnormal; Notable for the following:    Glucose, Bld 175 (*)    Creatinine, Ser 1.03 (*)    Calcium 8.4 (*)    Total Protein 6.4 (*)    Albumin 3.3 (*)    GFR calc non Af Amer 54 (*)    All other components within normal limits  TROPONIN I - Abnormal; Notable for the following:    Troponin I 0.06 (*)  All other components within normal limits  ETHANOL  PROTIME-INR  APTT  LIPASE, BLOOD  URINE DRUG SCREEN, QUALITATIVE ([facility] ONLY)  URINALYSIS COMPLETEWITH MICROSCOPIC ([facility] ONLY)   ____________________________________________  EKG  ECG reviewed and  interpreted me at 7 AM Normal sinus rhythm Heart rate 96 QRS 100 QTc 480 Reviewed and interpreted as normal sinus rhythm, changes most consistent with left ventricular hypertrophy without evidence of ischemia ____________________________________________  RADIOLOGY   DG Abd 1 View (Final result) Result time: 11/24/15 08:16:30   Final result by Rad Results In Interface (11/24/15 08:16:30)   Narrative:   CLINICAL DATA: Nausea, vomiting.  EXAM: ABDOMEN - 1 VIEW  COMPARISON: None.  FINDINGS: The bowel gas pattern is normal. No radio-opaque calculi or other significant radiographic abnormality are seen.  IMPRESSION: There is no definite evidence of bowel obstruction or ileus.   Electronically Signed By: Marijo Conception, M.D. On: 11/24/2015 08:16          DG Chest Port 1 View (Final result) Result time: 11/24/15 08:15:10   Final result by Rad Results In Interface (11/24/15 08:15:10)   Narrative:   CLINICAL DATA: Dizziness.  EXAM: PORTABLE CHEST 1 VIEW  COMPARISON: Radiograph of August 22, 2015.  FINDINGS: Stable cardiomegaly. No pneumothorax is noted. Minimal right pleural effusion is noted which is increased compared to prior exam. Stable interstitial densities are noted throughout both lungs which may simply represent scarring, but superimposed edema or inflammation cannot be excluded. Bony thorax is unremarkable.  IMPRESSION: Stable cardiomegaly and diffuse interstitial densities compared to prior exam as described above. Minimal right pleural effusion is noted which is increased compared to prior exam.   Electronically Signed By: Marijo Conception, M.D. On: 11/24/2015 08:15          CT Head Wo Contrast (Final result) Result time: 11/24/15 07:35:12   Final result by Rad Results In Interface (11/24/15 07:35:12)   Narrative:   CLINICAL DATA: Dizziness.  EXAM: CT HEAD WITHOUT CONTRAST  TECHNIQUE: Contiguous axial images were  obtained from the base of the skull through the vertex without intravenous contrast.  COMPARISON: None.  FINDINGS: Bony calvarium appears intact. Minimal chronic ischemic white matter disease is noted. No mass effect or midline shift is noted. Ventricular size is within normal limits. There is no evidence of mass lesion, hemorrhage or acute infarction.  IMPRESSION: Minimal chronic ischemic white matter disease. No acute intracranial abnormality seen. These results were called by telephone at the time of interpretation on 11/24/2015 at 7:33 am to Dr. Delman Kitten , who verbally acknowledged these results.   Electronically Signed By: Marijo Conception, M.D. On: 11/24/2015 07:35          ____________________________________________   PROCEDURES  Procedure(s) performed: None  Critical Care performed: Yes, see critical care note(s) CRITICAL CARE Performed by: Delman Kitten   Total critical care time: 35 minutes  Critical care time was exclusive of separately billable procedures and treating other patients.  Critical care was necessary to treat or prevent imminent or life-threatening deterioration.  Critical care was time spent personally by me on the following activities: development of treatment plan with patient and/or surrogate as well as nursing, discussions with consultants, evaluation of patient's response to treatment, examination of patient, obtaining history from patient or surrogate, ordering and performing treatments and interventions, ordering and review of laboratory studies, ordering and review of radiographic studies, pulse oximetry and re-evaluation of patient's condition.  Patient presents with sudden onset severe, somewhat non-extinguishing nystagmus involving left lateral  gaze. She has otherwise no evidence of acute ischemic stroke, but I am concerned about the possibility of posterior stroke given the sudden presentation and severity of nausea requiring  multiple antiemetics. I requested tele-neurology consult, as the patient's NIH score for me is technically 0, however I'm suspicious. The differential diagnosis certainly includes intracranial hemorrhage, ischemic stroke, vertigo, cardiac or pulmonary etiology though the patient denies any symptomatology of cardiac or pulmonary disease. Her oxygen level was noted 88% on room air, though she does carry a diagnosis of chronic COPD. ____________________________________________   INITIAL IMPRESSION / ASSESSMENT AND PLAN / ED COURSE  Pertinent labs & imaging results that were available during my care of the patient were reviewed by me and considered in my medical decision making (see chart for details).   ____________________________________________   FINAL CLINICAL IMPRESSION(S) / ED DIAGNOSES  Final diagnoses:  Acute onset of severe vertigo  Elevated troponin      Delman Kitten, MD 11/24/15 [id number]

## 2015-11-24 NOTE — ED Notes (Signed)
Patient unable tolerate PO Aspirin. Patient took 2 tablets and began to dry heave. Patient given Zofran 4 mg VIP. Dr. Jacqualine Code and Dr. Lavetta Nielsen informed. Dr. Jacqualine Code to put in order to give suppository form or Aspirin.

## 2015-11-24 NOTE — Progress Notes (Signed)
Notified Dr Lavetta Nielsen that pt passed swallow screen assessment in the ED, BP 133/43 BP meds due. Per MD to order cardiac diet, hold BP meds, diazepam to be given prior to MRI.

## 2015-11-24 NOTE — ED Notes (Signed)
Pt arrived via EMS from home c/o dizziness that started about 30-40 minutes ago while she was watching TV with associated N/V. EMS reports that when they arrived to the patient's home she was diaphoretic and cool to the touch, but patient reports "I feel like I'm burning from the inside out." Pt denies chest pain, headache, SOB. Denies hx of vertigo. EMS gave pt 4mg  of Zofran and pt reports the nausea has subsided. Pt arrived to ER with room air O2 saturations 88-91%, so placed on 2L by nasal canula. Has hx of COPD but reports that she does not wear O2 at home.

## 2015-11-25 ENCOUNTER — Inpatient Hospital Stay (HOSPITAL_COMMUNITY)
Admit: 2015-11-25 | Discharge: 2015-11-25 | Disposition: A | Discharge status: Home / Self Care | Payer: Commercial Managed Care - HMO | Attending: Internal Medicine | Admitting: Internal Medicine

## 2015-11-25 ENCOUNTER — Observation Stay: Admit: 2015-11-25 | Payer: Commercial Managed Care - HMO

## 2015-11-25 ENCOUNTER — Observation Stay: Payer: Commercial Managed Care - HMO

## 2015-11-25 DIAGNOSIS — Z743 Need for continuous supervision: Secondary | ICD-10-CM | POA: Diagnosis not present

## 2015-11-25 DIAGNOSIS — I639 Cerebral infarction, unspecified: Secondary | ICD-10-CM | POA: Diagnosis present

## 2015-11-25 DIAGNOSIS — I5042 Chronic combined systolic (congestive) and diastolic (congestive) heart failure: Secondary | ICD-10-CM | POA: Diagnosis not present

## 2015-11-25 DIAGNOSIS — Z8261 Family history of arthritis: Secondary | ICD-10-CM | POA: Diagnosis not present

## 2015-11-25 DIAGNOSIS — Z5189 Encounter for other specified aftercare: Secondary | ICD-10-CM | POA: Diagnosis not present

## 2015-11-25 DIAGNOSIS — R42 Dizziness and giddiness: Secondary | ICD-10-CM | POA: Diagnosis not present

## 2015-11-25 DIAGNOSIS — R112 Nausea with vomiting, unspecified: Secondary | ICD-10-CM | POA: Diagnosis not present

## 2015-11-25 DIAGNOSIS — I6523 Occlusion and stenosis of bilateral carotid arteries: Secondary | ICD-10-CM | POA: Diagnosis not present

## 2015-11-25 DIAGNOSIS — I130 Hypertensive heart and chronic kidney disease with heart failure and stage 1 through stage 4 chronic kidney disease, or unspecified chronic kidney disease: Secondary | ICD-10-CM | POA: Diagnosis not present

## 2015-11-25 DIAGNOSIS — G459 Transient cerebral ischemic attack, unspecified: Secondary | ICD-10-CM | POA: Diagnosis not present

## 2015-11-25 DIAGNOSIS — I6350 Cerebral infarction due to unspecified occlusion or stenosis of unspecified cerebral artery: Secondary | ICD-10-CM | POA: Diagnosis not present

## 2015-11-25 DIAGNOSIS — I509 Heart failure, unspecified: Secondary | ICD-10-CM | POA: Diagnosis not present

## 2015-11-25 DIAGNOSIS — I6789 Other cerebrovascular disease: Secondary | ICD-10-CM

## 2015-11-25 DIAGNOSIS — Z7982 Long term (current) use of aspirin: Secondary | ICD-10-CM | POA: Diagnosis not present

## 2015-11-25 DIAGNOSIS — N182 Chronic kidney disease, stage 2 (mild): Secondary | ICD-10-CM | POA: Diagnosis not present

## 2015-11-25 DIAGNOSIS — Z79899 Other long term (current) drug therapy: Secondary | ICD-10-CM | POA: Diagnosis not present

## 2015-11-25 DIAGNOSIS — R262 Difficulty in walking, not elsewhere classified: Secondary | ICD-10-CM | POA: Diagnosis not present

## 2015-11-25 DIAGNOSIS — R7989 Other specified abnormal findings of blood chemistry: Secondary | ICD-10-CM | POA: Diagnosis present

## 2015-11-25 DIAGNOSIS — I679 Cerebrovascular disease, unspecified: Secondary | ICD-10-CM | POA: Diagnosis not present

## 2015-11-25 DIAGNOSIS — I638 Other cerebral infarction: Secondary | ICD-10-CM | POA: Diagnosis not present

## 2015-11-25 DIAGNOSIS — R1312 Dysphagia, oropharyngeal phase: Secondary | ICD-10-CM | POA: Diagnosis not present

## 2015-11-25 DIAGNOSIS — E785 Hyperlipidemia, unspecified: Secondary | ICD-10-CM | POA: Diagnosis not present

## 2015-11-25 DIAGNOSIS — F3289 Other specified depressive episodes: Secondary | ICD-10-CM | POA: Diagnosis not present

## 2015-11-25 DIAGNOSIS — F17210 Nicotine dependence, cigarettes, uncomplicated: Secondary | ICD-10-CM | POA: Diagnosis not present

## 2015-11-25 DIAGNOSIS — J449 Chronic obstructive pulmonary disease, unspecified: Secondary | ICD-10-CM | POA: Diagnosis not present

## 2015-11-25 DIAGNOSIS — I10 Essential (primary) hypertension: Secondary | ICD-10-CM | POA: Diagnosis not present

## 2015-11-25 DIAGNOSIS — Z720 Tobacco use: Secondary | ICD-10-CM | POA: Diagnosis not present

## 2015-11-25 DIAGNOSIS — M6281 Muscle weakness (generalized): Secondary | ICD-10-CM | POA: Diagnosis not present

## 2015-11-25 LAB — URINALYSIS COMPLETE WITH MICROSCOPIC (ARMC ONLY)
Bilirubin Urine: NEGATIVE
Glucose, UA: NEGATIVE mg/dL
KETONES UR: NEGATIVE mg/dL
LEUKOCYTES UA: NEGATIVE
NITRITE: NEGATIVE
PH: 5 (ref 5.0–8.0)
PROTEIN: 30 mg/dL — AB
SPECIFIC GRAVITY, URINE: 1.017 (ref 1.005–1.030)

## 2015-11-25 LAB — URINE DRUG SCREEN, QUALITATIVE (ARMC ONLY)
Amphetamines, Ur Screen: NOT DETECTED
BARBITURATES, UR SCREEN: NOT DETECTED
Benzodiazepine, Ur Scrn: POSITIVE — AB
COCAINE METABOLITE, UR ~~LOC~~: NOT DETECTED
Cannabinoid 50 Ng, Ur ~~LOC~~: NOT DETECTED
MDMA (ECSTASY) UR SCREEN: NOT DETECTED
METHADONE SCREEN, URINE: NOT DETECTED
OPIATE, UR SCREEN: NOT DETECTED
Phencyclidine (PCP) Ur S: NOT DETECTED
Tricyclic, Ur Screen: NOT DETECTED

## 2015-11-25 LAB — HEMOGLOBIN A1C: Hgb A1c MFr Bld: 5.6 % (ref 4.0–6.0)

## 2015-11-25 LAB — LIPID PANEL
CHOL/HDL RATIO: 3 ratio
Cholesterol: 75 mg/dL (ref 0–200)
HDL: 25 mg/dL — AB (ref >40)
LDL CALC: 30 mg/dL (ref 0–99)
TRIGLYCERIDES: 99 mg/dL (ref <150)
VLDL: 20 mg/dL (ref 0–40)

## 2015-11-25 NOTE — Progress Notes (Signed)
Physical Therapy Evaluation Patient Details Name: Sandra Ellison MRN: [id number] DOB: [birth year] Today's Date: 11/25/2015   History of Present Illness  Sandra Ellison is a 70 y.o. female with a known history of COPD and non-option requiring, combined systolic and diastolic congestive heart failure who is presenting with acute onset dizziness and nausea. She describes acute setting around 6 AM this morning at rest room spinning sensation with nausea.   MRI shows acute infarct in the left superior cerebellum medially  Clinical Impression  Pt presents to PT with below baseline functional mobility and balance and would benefit from acute PT services to address objective findings.  Pt very sleepy/sedated at this time, but able to arouse for PT session with constant stimuli.  Pt with what appears to be good bilateral strength but as soon as pt stands up she demonstrates an increased posterior lean and unorganized gait with LOB x 2 requiring Min A for correction.  Pt also reporting she has not slept in 2 days.      Follow Up Recommendations SNF    Equipment Recommendations  Other (comment) (defer to post acute)    Recommendations for Other Services Rehab consult     Precautions / Restrictions Precautions Precautions: Fall Precaution Comments: Mod Restrictions Weight Bearing Restrictions: No      Mobility  Bed Mobility Overal bed mobility: Modified Independent             General bed mobility comments: Pops up from bed and able to get into sitting postion EOB with supervison, maintains sitting balance with supervision  Transfers Overall transfer level: Needs assistance Equipment used: Rolling walker (2 wheeled) Transfers: Sit to/from Stand Sit to Stand: Min guard         General transfer comment: Slow to rise with increased posterior lean against bed for stability  Ambulation/Gait Ambulation/Gait assistance: Min assist Ambulation Distance (Feet): 10 Feet Assistive  device: Rolling walker (2 wheeled) Gait Pattern/deviations: Leaning posteriorly;Staggering left;Staggering right     General Gait Details: Generally disorganized gait with RW.  Pt with posterior lean and able to correct with verbal and tactile cues, pt pushing RW to far forward from body, and feet unable to keep up with RW; Min A for LOB correction to L side x 2.  Stairs            Wheelchair Mobility    Modified Rankin (Stroke Patients Only)       Balance Overall balance assessment: Needs assistance Sitting-balance support: Bilateral upper extremity supported;Feet supported Sitting balance-Leahy Scale: Good   Postural control: Posterior lean Standing balance support: Bilateral upper extremity supported;During functional activity Standing balance-Leahy Scale: Poor Standing balance comment: Needs Min A to maintain during dynamic activity                             Pertinent Vitals/Pain      Home Living Family/patient expects to be discharged to:: Private residence Living Arrangements: Spouse/significant other ("man friend") Available Help at Discharge: Available PRN/intermittently Type of Home: House Home Access: Stairs to enter   [employer] of Steps:  (did not state/remember) Home Layout: One level   Additional Comments: No family at bedside, limited arousal ability to answer all questions    Prior Function Level of Independence: Independent         Comments: Denies needing help at home.     Hand Dominance        Extremity/Trunk Assessment  Upper Extremity Assessment: Overall WFL for tasks assessed           Lower Extremity Assessment: Overall WFL for tasks assessed         Communication   Communication:  (slow response times)  Cognition Arousal/Alertness: Lethargic Behavior During Therapy: Flat affect (sleepy) Overall Cognitive Status: Impaired/Different from baseline Area of Impairment: Attention;Awareness    Current Attention Level: Selective Memory: Decreased short-term memory     Awareness: Emergent   General Comments: Very lethargic, deeply sleeping and needing sustain stimulation to arouse, RN reports pt had valium during MRI this morning    General Comments      Exercises        Assessment/Plan    PT Assessment Patient needs continued PT services  PT Diagnosis Abnormality of gait   PT Problem List Decreased activity tolerance;Decreased balance;Decreased mobility;Decreased knowledge of use of DME;Decreased safety awareness  PT Treatment Interventions DME instruction;Gait training;Functional mobility training;Stair training;Therapeutic activities;Therapeutic exercise;Balance training;Neuromuscular re-education;Patient/family education   PT Goals (Current goals can be found in the Care Plan section) Acute Rehab PT Goals Patient Stated Goal: None stated PT Goal Formulation: With patient Potential to Achieve Goals: Fair    Frequency 7X/week   Barriers to discharge Decreased caregiver support alone during the day    Co-evaluation               End of Session Equipment Utilized During Treatment: Gait belt Activity Tolerance: Patient tolerated treatment well Patient left: in bed;with call bell/phone within reach;with bed alarm set Nurse Communication: Mobility status         Time: 1330-1403 PT Time Calculation (min) (ACUTE ONLY): 33 min   Charges:   PT Evaluation $PT Eval Moderate Complexity: 1 Procedure     PT G Codes:        Sandra Ellison A [name] December 05, 2015, 3:30 PM

## 2015-11-25 NOTE — Progress Notes (Signed)
Speech Therapy Note: received order, pt was out of room for tests earlier per initial ST attempt. Will f/u tomorrow w/ assessment/consult as indicated. Pt is on a regular diet w/ no reports of dysphagia per NSG staff today.

## 2015-11-25 NOTE — Progress Notes (Signed)
OT Cancellation Note  Patient Details Name: Sandra Ellison MRN: [id number] DOB: 17-Jul-1945   Cancelled Treatment:    Reason Eval/Treat Not Completed: Medical issues which prohibited therapy (Elevated Troponin Levels. Will continue to monitor and eval as appropriate.)   Harrel Carina, MS, OTR/L  Harrel Carina 11/25/2015, 11:32 AM

## 2015-11-25 NOTE — Progress Notes (Signed)
[place] [facility] [place] NAME: Sandra Ellison    MRN#:  [id number]  DATE OF BIRTH:  28-Feb-1946  SUBJECTIVE:  Hospital Day: none Sandra Ellison is a 70 y.o. female presenting with Dizziness and Nausea .   Overnight events: continue to have issues with nausea Interval Events: Sleeping but arousable no complaints this morning  REVIEW OF SYSTEMS:  CONSTITUTIONAL: No fever, fatigue or weakness.  EYES: No blurred or double vision.  EARS, NOSE, AND THROAT: No tinnitus or ear pain.  RESPIRATORY: No cough, shortness of breath, wheezing or hemoptysis.  CARDIOVASCULAR: No chest pain, orthopnea, edema.  GASTROINTESTINAL: No nausea, vomiting, diarrhea or abdominal pain.  GENITOURINARY: No dysuria, hematuria.  ENDOCRINE: No polyuria, nocturia,  HEMATOLOGY: No anemia, easy bruising or bleeding SKIN: No rash or lesion. MUSCULOSKELETAL: No joint pain or arthritis.   NEUROLOGIC: No tingling, numbness, weakness.  PSYCHIATRY: No anxiety or depression.   DRUG ALLERGIES:   Allergies  Allergen Reactions  . Ranitidine Anaphylaxis  . Ranitidine Hcl Anaphylaxis and Swelling  . Entresto [Sacubitril-Valsartan] Other (See Comments)    Potassium issues    VITALS:  Blood pressure 120/56, pulse 79, temperature 97.6 F (36.4 C), temperature source Oral, resp. rate 20, height 5\' 5"  (1.651 m), weight 135 lb 14.4 oz (61.644 kg), SpO2 94 %.  PHYSICAL EXAMINATION:  VITAL SIGNS: Filed Vitals:   11/25/15 0015 11/25/15 0428  BP:  120/56  Pulse: 75 79  Temp:  97.6 F (36.4 C)  Resp:  25   GENERAL:69 y.o.female currently in no acute distress.  HEAD: Normocephalic, atraumatic.  EYES: Pupils equal, round, reactive to light. Extraocular muscles intact. No scleral icterus.  MOUTH: Moist mucosal membrane. Dentition intact. No abscess noted.  EAR, NOSE, THROAT: Clear without exudates. No external lesions.  NECK: Supple. No thyromegaly. No nodules. No JVD.   PULMONARY: Clear to ascultation, without wheeze rails or rhonci. No use of accessory muscles, Good respiratory effort. good air entry bilaterally CHEST: Nontender to palpation.  CARDIOVASCULAR: S1 and S2. Regular rate and rhythm. No murmurs, rubs, or gallops. No edema. Pedal pulses 2+ bilaterally.  GASTROINTESTINAL: Soft, nontender, nondistended. No masses. Positive bowel sounds. No hepatosplenomegaly.  MUSCULOSKELETAL: No swelling, clubbing, or edema. Range of motion full in all extremities.  NEUROLOGIC: Cranial nerves II through XII are intact. No gross focal neurological deficits. Sensation intact. Reflexes intact.  SKIN: No ulceration, lesions, rashes, or cyanosis. Skin warm and dry. Turgor intact.  PSYCHIATRIC: Mood, affect within normal limits. The patient sleeping but arousable Insight, judgment intact.      LABORATORY PANEL:   CBC  Recent Labs Lab 11/24/15 0712  WBC 7.1  HGB 13.7  HCT 41.9  PLT 234   ------------------------------------------------------------------------------------------------------------------  Chemistries   Recent Labs Lab 11/24/15 0712  NA 138  K 3.8  CL 103  CO2 24  GLUCOSE 175*  BUN 17  CREATININE 1.03*  CALCIUM 8.4*  AST 36  ALT 19  ALKPHOS 85  BILITOT 1.1   ------------------------------------------------------------------------------------------------------------------  Cardiac Enzymes  Recent Labs Lab 11/24/15 2328  TROPONINI 0.05*   ------------------------------------------------------------------------------------------------------------------  RADIOLOGY:  Dg Abd 1 View  11/24/2015  CLINICAL DATA:  Nausea, vomiting. EXAM: ABDOMEN - 1 VIEW COMPARISON:  None. FINDINGS: The bowel gas pattern is normal. No radio-opaque calculi or other significant radiographic abnormality are seen. IMPRESSION: There is no definite evidence of bowel obstruction or ileus. Electronically Signed   By: Marijo Conception, M.D.   On: 11/24/2015  08:16  Ct Head Wo Contrast  11/24/2015  CLINICAL DATA:  Dizziness. EXAM: CT HEAD WITHOUT CONTRAST TECHNIQUE: Contiguous axial images were obtained from the base of the skull through the vertex without intravenous contrast. COMPARISON:  None. FINDINGS: Bony calvarium appears intact. Minimal chronic ischemic white matter disease is noted. No mass effect or midline shift is noted. Ventricular size is within normal limits. There is no evidence of mass lesion, hemorrhage or acute infarction. IMPRESSION: Minimal chronic ischemic white matter disease. No acute intracranial abnormality seen. These results were called by telephone at the time of interpretation on 11/24/2015 at 7:33 am to Dr. Delman Kitten , who verbally acknowledged these results. Electronically Signed   By: Marijo Conception, M.D.   On: 11/24/2015 07:35   US Carotid Bilateral  11/25/2015  CLINICAL DATA:  TIA. EXAM: BILATERAL CAROTID DUPLEX ULTRASOUND TECHNIQUE: Pearline Cables scale imaging, color Doppler and duplex ultrasound were performed of bilateral carotid and vertebral arteries in the neck. COMPARISON:  CT 11/24/2015 . FINDINGS: Criteria: Quantification of carotid stenosis is based on velocity parameters that correlate the residual internal carotid diameter with NASCET-based stenosis levels, using the diameter of the distal internal carotid lumen as the denominator for stenosis measurement. The following velocity measurements were obtained: RIGHT ICA:  63/9 cm/sec CCA:  99991111 cm/sec SYSTOLIC ICA/CCA RATIO:  0.6 DIASTOLIC ICA/CCA RATIO:  0.7 ECA:  80 cm/sec LEFT ICA:  73/17 cm/sec CCA:  [id number] cm/sec SYSTOLIC ICA/CCA RATIO:  0.6 DIASTOLIC ICA/CCA RATIO:  0.9 ECA:  0.9 cm/sec RIGHT CAROTID ARTERY: Mild to moderate right distal common carotid and carotid bifurcation atherosclerotic vascular disease. No flow limiting stenosis. RIGHT VERTEBRAL ARTERY:  Patent with antegrade flow. LEFT CAROTID ARTERY: Mild moderate left distal common carotid carotid bifurcation  atherosclerotic vascular disease. No flow limiting stenosis. LEFT VERTEBRAL ARTERY: Questionable retrograde flow left vertebral artery. IMPRESSION: 1. Mild moderate bilateral distal common carotid and carotid bifurcation atherosclerotic vascular disease. No flow limiting stenosis . Degree of stenosis less than 50% bilaterally. 2. Questionable retrograde flow left vertebral artery, subclavian steal cannot be excluded. Electronically Signed   By: [place]   On: 11/25/2015 11:40   Dg Chest Port 1 View  11/24/2015  CLINICAL DATA:  Dizziness. EXAM: PORTABLE CHEST 1 VIEW COMPARISON:  Radiograph of August 22, 2015. FINDINGS: Stable cardiomegaly. No pneumothorax is noted. Minimal right pleural effusion is noted which is increased compared to prior exam. Stable interstitial densities are noted throughout both lungs which may simply represent scarring, but superimposed edema or inflammation cannot be excluded. Bony thorax is unremarkable. IMPRESSION: Stable cardiomegaly and diffuse interstitial densities compared to prior exam as described above. Minimal right pleural effusion is noted which is increased compared to prior exam. Electronically Signed   By: Marijo Conception, M.D.   On: 11/24/2015 08:15    EKG:   Orders placed or performed during the hospital encounter of 11/24/15  . ED EKG  . ED EKG    ASSESSMENT AND PLAN:   Sandra Ellison is a 70 y.o. female presenting with Dizziness and Nausea . Admitted 11/24/2015 : Day #: none 1. Vertigo/TIA: Continue aspirin statin, carotid Dopplers performed, MRI pending 2. Intractable nausea and vomiting secondary to above continue with supportive measures advance diet as tolerated 3.COPD unspecified: Supplemental oxygen as required, breathing treatments continue home medication 4. Essential hypertension Coreg lisinopril hydralazine 5. Venous thrombi embolism prophylactic: Lovenox  All the records are reviewed and case discussed with Care Management/Social  Workerr. Management plans discussed with the patient, family  and they are in agreement.  CODE STATUS: full TOTAL TIME TAKING CARE OF THIS PATIENT: 28 minutes.   POSSIBLE D/C IN 1-2DAYS, DEPENDING ON CLINICAL CONDITION.   Sandra Ellison,  Karenann Cai.D on 11/25/2015 at 1:35 PM  Between 7am to 6pm - Pager - [phone]  After 6pm: House Pager: - [facility]  Office  [phone]  CC: Primary care physician; Einar Pheasant, MD

## 2015-11-26 DIAGNOSIS — I639 Cerebral infarction, unspecified: Secondary | ICD-10-CM

## 2015-11-26 LAB — ECHOCARDIOGRAM COMPLETE
CHL CUP MV DEC (S): 162
E/e' ratio: 32.26
EWDT: 162 ms
FS: 4 % — AB (ref 28–44)
HEIGHTINCHES: 65 in
IVS/LV PW RATIO, ED: 1.04
LA ID, A-P, ES: 47 mm
LADIAMINDEX: 2.79 cm/m2
LAVOLA4C: 85 mL
LEFT ATRIUM END SYS DIAM: 47 mm
LV PW d: 9.15 mm — AB (ref 0.6–1.1)
LV sys vol: 157 mL — AB (ref 14–42)
LVDIAVOL: 176 mL — AB (ref 46–106)
LVDIAVOLIN: 104 mL/m2
LVEEAVG: 32.26
LVEEMED: 32.26
LVELAT: 3.41 cm/s
LVSYSVOLIN: 93 mL/m2
Lateral S' vel: 7.78 cm/s
MV Peak grad: 5 mmHg
MV pk A vel: 61.1 m/s
MV pk E vel: 110 m/s
Simpson's disk: 11
Stroke v: 19 ml
TDI e' lateral: 3.41
WEIGHTICAEL: 2174.4 [oz_av]

## 2015-11-26 MED ORDER — CLOPIDOGREL BISULFATE 75 MG PO TABS
75.0000 mg | ORAL_TABLET | Freq: Every day | ORAL | Status: DC
  Administered 2015-11-26 – 2015-11-27 (×2): 75 mg via ORAL
  Filled 2015-11-26 (×2): qty 1

## 2015-11-26 NOTE — Evaluation (Signed)
Clinical/Bedside Swallow Evaluation Patient Details  Name: Sandra Ellison MRN: [id number] Date of Birth: 12/10/1945  Today's Date: 11/26/2015 Time: SLP Start Time (ACUTE ONLY): [id number] SLP Stop Time (ACUTE ONLY): 0920 SLP Time Calculation (min) (ACUTE ONLY): 45 min  Past Medical History:  Past Medical History  Diagnosis Date  . Chronic combined systolic and diastolic CHF (congestive heart failure) ([place])     a. 01/2012 Echo: EF 30-35%, mod conc LVH, sev inf HK, mildly dil LA, mild-mod MR, Trace TR, RVSP 40-72mmHg.    . Dilated cardiomyopathy ([place])     a. 02/2011 MV: EF 27%, anteroseptal, inf, inferolateral scar, no ischemia.  Marland Kitchen COPD (chronic obstructive pulmonary disease) ([place])   . Hx of colonic polyps   . Iron deficiency anemia     a. h/o transfusions, prev followed by heme/onc.  . Chronic kidney disease (CKD), stage II (mild)   . Pulmonary HTN ([place])   . Arthritis   . Allergy   . History of diverticulosis   . History of abnormal Pap smear     a. s/p vaginal hysterectomy  . GERD (gastroesophageal reflux disease)   . Hyperkalemia     a. 02/2015 in setting of entresto/AKI->entresto d/c'd.  . Carotid arterial disease ([place])     a. 02/2015 Carotid U/S: RICA [id number], LICA [id number].  . Tobacco abuse   . Hypertension    Past Surgical History:  Past Surgical History  Procedure Laterality Date  . Tonsillectomy  age 3  . Vaginal hysterectomy  1981    secondary to abnormal pap smear  . Orif distal radius fracture  2011    right  . Colonoscopy  10/2010  . Cardiac catheterization Left 08/05/2015    Procedure: Left Heart Cath and Coronary Angiography;  Surgeon: Minna Merritts, MD;  Location: [facility];  Service: Cardiovascular;  Laterality: Left;   HPI:  Pt is a 70 y.o. female with a known history of COPD and non-option requiring, combined systolic and diastolic CHF who is presenting with acute onset dizziness and nausea. She describes acute setting around 6 AM this morning at rest  room spinning sensation with nausea. Denies any focal weakness further symptomatology. Called EMS with the above findings received Zofran with transient relief. On presentation to the ER code stroke called. Still has symptoms of room spinning sensation and nausea with intermittent nonbloody nonbilious emesis. Denies shortness of breath cough fever chills further symptomatology. MRI revealed Acute infarct in the left superior cerebellum medially. No hemorrhage. Currently, pt is awake/alert and verbally conversive but appeared easily fatigued w/ any exertion. Speech was intelligible; pt followed instruction given verbal cues. NSG and pt denied any difficulty swallowing; pt is on a regular diet at this time, though, pt does require extra time to swallow pills (baseline).    Assessment / Plan / Recommendation Clinical Impression  Pt appeared to adequately tolerate trials of thin liquids and puree/soft solids (moistened well) w/ no overt s/s of aspiration during/post trials; pt appears at reduced risk for aspiration following general aspiration precautions. Pt exhibited min oral phase deficits c/b min increased oral phase time for mastication and breaking down the soft solids d/t lacking sufficient dentition - pt only has a few lower teeth in poor condition, no upper dentition(does not wear dentures). Pt is able to feed herself w/ setup assistance. Due to pt's quick fatigue w/ any exertion and min increased WOB w/ physical exertion, recommend a Dysphagia 3 diet w/ thin liquids - NO large straws; softer,  broken-down foods in her diet; general aspiration precautions; Meds in Puree for easier swallowing; tray setup at meals, positioning upright for any oral intake. NSG/CM updated and agreed.     Aspiration Risk   (reduced following precautions)    Diet Recommendation  Dysphagia 3(mech soft - cut meats, moistened), thin liquids; aspiration precautions; reflux precautions; tray setup assistance d/t weakness overall.  Rest breaks during meals as needed to avoid fatigue and SOB.  Medication Administration: Whole meds with puree    Other  Recommendations Recommended Consults:  (dietician as indicated) Oral Care Recommendations: Oral care BID;Staff/trained caregiver to provide oral care   Follow up Recommendations  None    Frequency and Duration            Prognosis Prognosis for Safe Diet Advancement: Good      Swallow Study   General Date of Onset: 11/24/15 HPI: Pt is a 70 y.o. female with a known history of COPD and non-option requiring, combined systolic and diastolic CHF who is presenting with acute onset dizziness and nausea. She describes acute setting around 6 AM this morning at rest room spinning sensation with nausea. Denies any focal weakness further symptomatology. Called EMS with the above findings received Zofran with transient relief. On presentation to the ER code stroke called. Still has symptoms of room spinning sensation and nausea with intermittent nonbloody nonbilious emesis. Denies shortness of breath cough fever chills further symptomatology. MRI revealed Acute infarct in the left superior cerebellum medially. No hemorrhage. Currently, pt is awake/alert and verbally conversive but appeared easily fatigued w/ any exertion. Speech was intelligible; pt followed instruction given verbal cues. NSG and pt denied any difficulty swallowing; pt is on a regular diet at this time, though, pt does require extra time to swallow pills (baseline).  Type of Study: Bedside Swallow Evaluation Previous Swallow Assessment: none indicated Diet Prior to this Study: Regular;Thin liquids Temperature Spikes Noted: No Respiratory Status: Nasal cannula (2 liters) History of Recent Intubation: No Behavior/Cognition: Alert;Cooperative;Pleasant mood;Requires cueing Oral Cavity Assessment: Within Functional Limits Oral Care Completed by SLP: Recent completion by staff Oral Cavity - Dentition: Missing  dentition;Poor condition Vision: Functional for self-feeding Self-Feeding Abilities: Able to feed self;Needs assist;Needs set up Patient Positioning: Upright in chair Baseline Vocal Quality: Normal;Low vocal intensity Volitional Cough: Strong Volitional Swallow: Able to elicit    Oral/Motor/Sensory Function Overall Oral Motor/Sensory Function: Within functional limits   Ice Chips Ice chips: Not tested   Thin Liquid Thin Liquid: Within functional limits Presentation: Cup;Self Fed;Straw (4-5 sips via each of juice and water)    Nectar Thick Nectar Thick Liquid: Not tested   Honey Thick Honey Thick Liquid: Not tested   Puree Puree: Within functional limits Presentation: Self Fed;Spoon (4-5 trials)   Solid   GO   Solid: Impaired Presentation: Self Fed;Spoon (2 trials - sufficiently softened food trials) Oral Phase Impairments: Impaired mastication (lacking dentition) Oral Phase Functional Implications: Impaired mastication (min increased time; fatigue is a concern) Pharyngeal Phase Impairments:  (none)       Orinda Kenner, MS, CCC-SLP  Sandra Ellison 11/26/2015,11:47 AM

## 2015-11-26 NOTE — Progress Notes (Signed)
[place] at [place] NAME: Sandra Ellison    MRN#:  [id number]  DATE OF BIRTH:  13-May-1946  SUBJECTIVE:  Hospital Day: 1 day Sandra Ellison is a 70 y.o. female presenting with Dizziness and Nausea .   Overnight events: No overnight events Interval Events: Nauseousness improved intermittent dizziness but improving  REVIEW OF SYSTEMS:  CONSTITUTIONAL: No fever,Positive fatigue or weakness.  EYES: No blurred or double vision.  EARS, NOSE, AND THROAT: No tinnitus or ear pain.  RESPIRATORY: No cough, shortness of breath, wheezing or hemoptysis.  CARDIOVASCULAR: No chest pain, orthopnea, edema.  GASTROINTESTINAL: No nausea, vomiting, diarrhea or abdominal pain.  GENITOURINARY: No dysuria, hematuria.  ENDOCRINE: No polyuria, nocturia,  HEMATOLOGY: No anemia, easy bruising or bleeding SKIN: No rash or lesion. MUSCULOSKELETAL: No joint pain or arthritis.   NEUROLOGIC: No tingling, numbness, weakness.  PSYCHIATRY: No anxiety or depression.   DRUG ALLERGIES:   Allergies  Allergen Reactions  . Ranitidine Anaphylaxis  . Ranitidine Hcl Anaphylaxis and Swelling  . Entresto [Sacubitril-Valsartan] Other (See Comments)    Potassium issues    VITALS:  Blood pressure 123/48, pulse 72, temperature 98.6 F (37 C), temperature source Oral, resp. rate 20, height 5\' 5"  (1.651 m), weight 135 lb 14.4 oz (61.644 kg), SpO2 96 %.  PHYSICAL EXAMINATION:  VITAL SIGNS: Filed Vitals:   11/26/15 0451 11/26/15 1239  BP: 141/68 123/48  Pulse: 81 72  Temp: 97.4 F (36.3 C) 98.6 F (37 C)  Resp: 18 20   GENERAL:69 y.o.female currently in no acute distress.  HEAD: Normocephalic, atraumatic.  EYES: Pupils equal, round, reactive to light. Extraocular muscles intact. No scleral icterus.  MOUTH: Moist mucosal membrane. Dentition intact. No abscess noted.  EAR, NOSE, THROAT: Clear without exudates. No external lesions.  NECK: Supple. No thyromegaly. No  nodules. No JVD.  PULMONARY: Clear to ascultation, without wheeze rails or rhonci. No use of accessory muscles, Good respiratory effort. good air entry bilaterally CHEST: Nontender to palpation.  CARDIOVASCULAR: S1 and S2. Regular rate and rhythm. No murmurs, rubs, or gallops. No edema. Pedal pulses 2+ bilaterally.  GASTROINTESTINAL: Soft, nontender, nondistended. No masses. Positive bowel sounds. No hepatosplenomegaly.  MUSCULOSKELETAL: No swelling, clubbing, or edema. Range of motion full in all extremities.  NEUROLOGIC: Cranial nerves II through XII are intact. No gross focal neurological deficits. Sensation intact. Reflexes intact.  SKIN: No ulceration, lesions, rashes, or cyanosis. Skin warm and dry. Turgor intact.  PSYCHIATRIC: Mood, affect within normal limits. The patient awake alert oriented 3 Insight, judgment intact.      LABORATORY PANEL:   CBC  Recent Labs Lab 11/24/15 0712  WBC 7.1  HGB 13.7  HCT 41.9  PLT 234   ------------------------------------------------------------------------------------------------------------------  Chemistries   Recent Labs Lab 11/24/15 0712  NA 138  K 3.8  CL 103  CO2 24  GLUCOSE 175*  BUN 17  CREATININE 1.03*  CALCIUM 8.4*  AST 36  ALT 19  ALKPHOS 85  BILITOT 1.1   ------------------------------------------------------------------------------------------------------------------  Cardiac Enzymes  Recent Labs Lab 11/24/15 2328  TROPONINI 0.05*   ------------------------------------------------------------------------------------------------------------------  RADIOLOGY:  Mr Brain Wo Contrast  11/25/2015  CLINICAL DATA:  TIA.  Acute onset dizziness and nausea. EXAM: MRI HEAD WITHOUT CONTRAST TECHNIQUE: Multiplanar, multiecho pulse sequences of the brain and surrounding structures were obtained without intravenous contrast. COMPARISON:  CT head 11/24/2015 FINDINGS: Acute infarct left superior cerebellum extending into  the superior cerebellar peduncle and lateral to the fourth ventricle on  the left. No other areas of acute infarct. Negative for hemorrhage. Patchy hyperintensity in the cerebral right white matter consistent with mild chronic microvascular ischemia. Brainstem intact. Negative for mass or edema Ventricle size normal.  Cerebral volume normal for age. Mild mucosal edema paranasal sinuses. Pituitary and skull base normal. IMPRESSION: Acute infarct in the left superior cerebellum medially. No associated hemorrhage Mild chronic microvascular ischemia. Electronically Signed   By: Franchot Gallo M.D.   On: 11/25/2015 14:17   US Carotid Bilateral  11/25/2015  CLINICAL DATA:  TIA. EXAM: BILATERAL CAROTID DUPLEX ULTRASOUND TECHNIQUE: Pearline Cables scale imaging, color Doppler and duplex ultrasound were performed of bilateral carotid and vertebral arteries in the neck. COMPARISON:  CT 11/24/2015 . FINDINGS: Criteria: Quantification of carotid stenosis is based on velocity parameters that correlate the residual internal carotid diameter with NASCET-based stenosis levels, using the diameter of the distal internal carotid lumen as the denominator for stenosis measurement. The following velocity measurements were obtained: RIGHT ICA:  63/9 cm/sec CCA:  99991111 cm/sec SYSTOLIC ICA/CCA RATIO:  0.6 DIASTOLIC ICA/CCA RATIO:  0.7 ECA:  80 cm/sec LEFT ICA:  73/17 cm/sec CCA:  [id number] cm/sec SYSTOLIC ICA/CCA RATIO:  0.6 DIASTOLIC ICA/CCA RATIO:  0.9 ECA:  0.9 cm/sec RIGHT CAROTID ARTERY: Mild to moderate right distal common carotid and carotid bifurcation atherosclerotic vascular disease. No flow limiting stenosis. RIGHT VERTEBRAL ARTERY:  Patent with antegrade flow. LEFT CAROTID ARTERY: Mild moderate left distal common carotid carotid bifurcation atherosclerotic vascular disease. No flow limiting stenosis. LEFT VERTEBRAL ARTERY: Questionable retrograde flow left vertebral artery. IMPRESSION: 1. Mild moderate bilateral distal common carotid and  carotid bifurcation atherosclerotic vascular disease. No flow limiting stenosis . Degree of stenosis less than 50% bilaterally. 2. Questionable retrograde flow left vertebral artery, subclavian steal cannot be excluded. Electronically Signed   By: Marcello Moores  Register   On: 11/25/2015 11:40    EKG:   Orders placed or performed during the hospital encounter of 11/24/15  . ED EKG  . ED EKG    ASSESSMENT AND PLAN:   Sandra Ellison is a 70 y.o. female presenting with Dizziness and Nausea . Admitted 11/24/2015 : Day #: 1 day 1. Acute CVA: Noted on MRI, neurology input appreciated, Plavix  2. Intractable nausea and vomiting resolved 3.COPD unspecified: Supplemental oxygen as required, breathing treatments continue home medication 4. Essential hypertension Coreg lisinopril hydralazine 5. Venous thrombi embolism prophylactic: Lovenox  Disposition SNF  All the records are reviewed and case discussed with Care Management/Social Workerr. Management plans discussed with the patient, family and they are in agreement.  CODE STATUS: full TOTAL TIME TAKING CARE OF THIS PATIENT: 28 minutes.   POSSIBLE D/C IN 1-2DAYS, DEPENDING ON CLINICAL CONDITION.   Colleen Donahoe,  Karenann Cai.D on 11/26/2015 at 3:14 PM  Between 7am to 6pm - Pager - [phone]  After 6pm: House Pager: - [phone]  [facility]  Office  [phone]  CC: Primary care physician; Einar Pheasant, MD

## 2015-11-26 NOTE — NC FL2 (Signed)
[place] LEVEL OF CARE SCREENING TOOL     IDENTIFICATION  Patient Name: Sandra Ellison Birthdate: 12/15/1945 Sex: female Admission Date (Current Location): 11/24/2015  [place] and [place] Number:  Engineering geologist and Address:  [facility], [address] [place], [place] [address]      Provider Number: [id number]  Attending Physician Name and Address:  Lytle Butte, MD  Relative Name and Phone Number:       Current Level of Care: Hospital Recommended Level of Care: [place] Prior Approval Number:    Date Approved/Denied:   PASRR Number: [id number]  Discharge Plan: SNF    Current Diagnoses: Patient Active Problem List   Diagnosis Date Noted  . CVA (cerebral infarction) 11/25/2015  . TIA (transient ischemic attack) 11/24/2015  . Anxiety 08/31/2015  . Congestive dilated cardiomyopathy ([place])   . Cardiomyopathy, ischemic 07/24/2015  . Foot swelling 06/15/2015  . Chronic combined systolic and diastolic CHF (congestive heart failure) ([place])   . Dilated cardiomyopathy ([place])   . Carotid arterial disease ([place])   . Tobacco abuse   . Essential hypertension 03/17/2015  . CKD (chronic kidney disease), stage IV ([place]) 03/06/2015  . Abnormal mammogram 02/23/2015  . Encounter for screening colonoscopy 01/26/2015  . Health care maintenance 08/12/2014  . Chronic systolic CHF (congestive heart failure) ([place]) 05/21/2014  . Microcalcifications of the breast 01/24/2014  . Knee pain, bilateral 07/22/2013  . Pulmonary nodule 05/20/2013  . Insomnia 03/08/2013  . Left knee pain 03/08/2013  . Ulnar nerve injury 03/07/2013  . GERD (gastroesophageal reflux disease) 02/28/2013  . History of colonic polyps 02/28/2013  . Diverticulosis 02/28/2013  . Anemia 02/28/2013  . COPD (chronic obstructive pulmonary disease) ([place]) 02/28/2013  . Pulmonary hypertension ([place]) 02/28/2013  . Nonischemic cardiomyopathy ([place]) 02/26/2012  .  Carotid bruit 02/26/2012  . SOB (shortness of breath) 03/03/2011  . Smoking 03/03/2011  . Hyperlipidemia 03/03/2011  . CAD (coronary artery disease) 03/03/2011    Orientation RESPIRATION BLADDER Height & Weight     Self, Time, Situation, Place  O2 (2L) Continent Weight: 135 lb 14.4 oz (61.644 kg) Height:  5\' 5"  (165.1 cm)  BEHAVIORAL SYMPTOMS/MOOD NEUROLOGICAL BOWEL NUTRITION STATUS      Continent Diet (Dys 3)  AMBULATORY STATUS COMMUNICATION OF NEEDS Skin   Limited Assist Verbally Normal                       Personal Care Assistance Level of Assistance  Bathing, Feeding, Dressing Bathing Assistance: Limited assistance Feeding assistance: Independent Dressing Assistance: Limited assistance     Functional Limitations Info  Sight, Hearing, Speech Sight Info: Adequate Hearing Info: Adequate Speech Info: Adequate    SPECIAL CARE FACTORS FREQUENCY  PT (By licensed PT), OT (By licensed OT)     PT Frequency: 5 OT Frequency: 5            Contractures      Additional Factors Info  Code Status, Allergies Code Status Info: Full Code Allergies Info: Allergies: Ranitidine, Ranitidine Hcl, Entresto           Current Medications (11/26/2015):  This is the current hospital active medication list Current Facility-Administered Medications  Medication Dose Route Frequency Provider Last Rate Last Dose  . acetaminophen (TYLENOL) tablet 650 mg  650 mg Oral Q6H PRN Lytle Butte, MD   650 mg at 11/26/15 0920   Or  . acetaminophen (TYLENOL) suppository 650 mg  650 mg  Rectal Q6H PRN Lytle Butte, MD      . aspirin EC tablet 81 mg  81 mg Oral Daily Loleta Dicker, RPH   81 mg at 11/26/15 0920  . atorvastatin (LIPITOR) tablet 40 mg  40 mg Oral Daily Lytle Butte, MD   40 mg at 11/26/15 0920  . carvedilol (COREG) tablet 6.25 mg  6.25 mg Oral BID WC Lytle Butte, MD   6.25 mg at 11/26/15 0920  . citalopram (CELEXA) tablet 10 mg  10 mg Oral Daily Lytle Butte, MD   10 mg  at 11/26/15 0920  . clopidogrel (PLAVIX) tablet 75 mg  75 mg Oral Daily Lytle Butte, MD      . enoxaparin (LOVENOX) injection 40 mg  40 mg Subcutaneous Q24H Lytle Butte, MD   40 mg at 11/26/15 [id number]  . gabapentin (NEURONTIN) capsule 100 mg  100 mg Oral TID Lytle Butte, MD   100 mg at 11/26/15 0920  . hydrALAZINE (APRESOLINE) tablet 25 mg  25 mg Oral BID Lytle Butte, MD   25 mg at 11/26/15 0920  . ipratropium-albuterol (DUONEB) 0.5-2.5 (3) MG/3ML nebulizer solution 3 mL  3 mL Nebulization Q4H PRN Lytle Butte, MD   3 mL at 11/25/15 1534  . lisinopril (PRINIVIL,ZESTRIL) tablet 10 mg  10 mg Oral Daily Lytle Butte, MD   10 mg at 11/26/15 0920  . meclizine (ANTIVERT) tablet 12.5 mg  12.5 mg Oral TID PRN Lytle Butte, MD      . morphine 2 MG/ML injection 2 mg  2 mg Intravenous Q4H PRN Lytle Butte, MD      . ondansetron [facility]) tablet 4 mg  4 mg Oral Q4H PRN Lytle Butte, MD       Or  . ondansetron [facility]) injection 4 mg  4 mg Intravenous Q6H PRN Lytle Butte, MD   4 mg at 11/26/15 0450  . oxyCODONE (Oxy IR/ROXICODONE) immediate release tablet 5 mg  5 mg Oral Q4H PRN Lytle Butte, MD   5 mg at 11/26/15 0450  . pantoprazole (PROTONIX) EC tablet 40 mg  40 mg Oral Daily Lytle Butte, MD   40 mg at 11/26/15 0920  . prochlorperazine (COMPAZINE) injection 10 mg  10 mg Intravenous Q6H PRN Lytle Butte, MD   10 mg at 11/25/15 2207  . sodium chloride flush (NS) 0.9 % injection 3 mL  3 mL Intravenous Q12H Lytle Butte, MD   3 mL at 11/26/15 [id number]  . tiotropium (SPIRIVA) inhalation capsule 18 mcg  18 mcg Inhalation Daily Lytle Butte, MD   18 mcg at 11/26/15 [id number]     Discharge Medications: Please see discharge summary for a list of discharge medications.  Relevant Imaging Results:  Relevant Lab Results:   Additional Information SSN:  [id number]  Darden Dates, LCSW

## 2015-11-26 NOTE — Consult Note (Signed)
Referring Physician: Hower    Chief Complaint: Dizziness  HPI: Sandra Ellison is an 70 y.o. female who reports awakening on the DOA normal.  At 0600 had the acute onset of nausea and the room spinning.  Felt she was leaning to the left.  EMS was called and the patient was admitted for further evaluation.  Initial NIHSS of 1.    Date last known well: 11/24/2015 Time last known well: Time: 06:00 tPA Given: No: Minimal symptoms  Past Medical History  Diagnosis Date  . Chronic combined systolic and diastolic CHF (congestive heart failure) ([place])     a. 01/2012 Echo: EF 30-35%, mod conc LVH, sev inf HK, mildly dil LA, mild-mod MR, Trace TR, RVSP 40-79mmHg.    . Dilated cardiomyopathy ([place])     a. 02/2011 MV: EF 27%, anteroseptal, inf, inferolateral scar, no ischemia.  Marland Kitchen COPD (chronic obstructive pulmonary disease) ([place])   . Hx of colonic polyps   . Iron deficiency anemia     a. h/o transfusions, prev followed by heme/onc.  . Chronic kidney disease (CKD), stage II (mild)   . Pulmonary HTN ([place])   . Arthritis   . Allergy   . History of diverticulosis   . History of abnormal Pap smear     a. s/p vaginal hysterectomy  . GERD (gastroesophageal reflux disease)   . Hyperkalemia     a. 02/2015 in setting of entresto/AKI->entresto d/c'd.  . Carotid arterial disease ([place])     a. 02/2015 Carotid U/S: RICA A999333, LICA 123456.  . Tobacco abuse   . Hypertension     Past Surgical History  Procedure Laterality Date  . Tonsillectomy  age 10  . Vaginal hysterectomy  1981    secondary to abnormal pap smear  . Orif distal radius fracture  2011    right  . Colonoscopy  10/2010  . Cardiac catheterization Left 08/05/2015    Procedure: Left Heart Cath and Coronary Angiography;  Surgeon: Minna Merritts, MD;  Location: [facility];  Service: Cardiovascular;  Laterality: Left;    Family History  Problem Relation Age of Onset  . Arthritis Mother   . Alcohol abuse Father   . Arthritis Father    . Cancer Father     throat/spine  . Breast cancer Neg Hx    Social History:  reports that she has been smoking Cigarettes.  She has a 21 pack-year smoking history. She has never used smokeless tobacco. She reports that she does not drink alcohol or use illicit drugs.  Allergies:  Allergies  Allergen Reactions  . Ranitidine Anaphylaxis  . Ranitidine Hcl Anaphylaxis and Swelling  . Entresto [Sacubitril-Valsartan] Other (See Comments)    Potassium issues    Medications:  I have reviewed the patient's current medications. Prior to Admission:  Prescriptions prior to admission  Medication Sig Dispense Refill Last Dose  . albuterol (PROVENTIL HFA;VENTOLIN HFA) 108 (90 Base) MCG/ACT inhaler Inhale 2 puffs into the lungs every 6 (six) hours as needed for wheezing or shortness of breath. 1 Inhaler 3 Past Month at Unknown time  . albuterol (PROVENTIL) (2.5 MG/3ML) 0.083% nebulizer solution Take 3 mLs by nebulization every 4 (four) hours as needed. For wheezing/shortness of breath.   Past Month at Unknown time  . aspirin EC 81 MG tablet Take 81 mg by mouth daily.   11/23/2015 at Unknown time  . atorvastatin (LIPITOR) 40 MG tablet Take 1 tablet (40 mg total) by mouth daily. 30 tablet 11 11/23/2015  at Unknown time  . carvedilol (COREG) 6.25 MG tablet Take 6.25 mg by mouth 2 (two) times daily with a meal.  3 11/23/2015 at 1830  . citalopram (CELEXA) 10 MG tablet TAKE 1 TABLET (10 MG TOTAL) BY MOUTH DAILY. 30 tablet 3 11/23/2015 at Unknown time  . Fe Fum-FePoly-Vit C-Vit B3 (INTEGRA) 62.5-62.5-40-3 MG CAPS One per day (Patient taking differently: Take 1 capsule by mouth daily. ) 30 capsule 2 11/23/2015 at Unknown time  . furosemide (LASIX) 20 MG tablet TAKE 1 TABLET (20 MG TOTAL) BY MOUTH DAILY AS NEEDED. 30 tablet 6 Past Month at Unknown time  . gabapentin (NEURONTIN) 100 MG capsule TAKE 1 CAPSULE (100 MG TOTAL) BY MOUTH 3 (THREE) TIMES DAILY AS NEEDED. 90 capsule 1 Past Month at Unknown time  .  lisinopril (PRINIVIL,ZESTRIL) 10 MG tablet Take 10 mg by mouth daily.  3 11/23/2015 at Unknown time  . pantoprazole (PROTONIX) 40 MG tablet TAKE 1 TABLET (40 MG TOTAL) BY MOUTH DAILY. 30 tablet 11 11/23/2015 at Unknown time  . polyethylene glycol (MIRALAX / GLYCOLAX) packet Take 17 g by mouth daily as needed for mild constipation.   Past Month at Unknown time  . tiotropium (SPIRIVA HANDIHALER) 18 MCG inhalation capsule One inhalation daily (Patient taking differently: Place 18 mcg into inhaler and inhale daily. ) 30 capsule 5 11/23/2015 at Unknown time   Scheduled: . aspirin EC  81 mg Oral Daily  . atorvastatin  40 mg Oral Daily  . carvedilol  6.25 mg Oral BID WC  . citalopram  10 mg Oral Daily  . clopidogrel  75 mg Oral Daily  . enoxaparin (LOVENOX) injection  40 mg Subcutaneous Q24H  . gabapentin  100 mg Oral TID  . hydrALAZINE  25 mg Oral BID  . lisinopril  10 mg Oral Daily  . pantoprazole  40 mg Oral Daily  . sodium chloride flush  3 mL Intravenous Q12H  . tiotropium  18 mcg Inhalation Daily    ROS: History obtained from the patient  General ROS: negative for - chills, fatigue, fever, night sweats, weight gain or weight loss Psychological ROS: negative for - behavioral disorder, hallucinations, memory difficulties, mood swings or suicidal ideation Ophthalmic ROS: negative for - blurry vision, double vision, eye pain or loss of vision ENT ROS: negative for - epistaxis, nasal discharge, oral lesions, sore throat, tinnitus or vertigo Allergy and Immunology ROS: negative for - hives or itchy/watery eyes Hematological and Lymphatic ROS: negative for - bleeding problems, bruising or swollen lymph nodes Endocrine ROS: negative for - galactorrhea, hair pattern changes, polydipsia/polyuria or temperature intolerance Respiratory ROS: cough Cardiovascular ROS: negative for - chest pain, dyspnea on exertion, edema or irregular heartbeat Gastrointestinal ROS: negative for - abdominal pain,  diarrhea, hematemesis, nausea/vomiting or stool incontinence Genito-Urinary ROS: negative for - dysuria, hematuria, incontinence or urinary frequency/urgency Musculoskeletal ROS: negative for - joint swelling or muscular weakness Neurological ROS: as noted in HPI Dermatological ROS: negative for rash and skin lesion changes  Physical Examination: Blood pressure 123/48, pulse 72, temperature 98.6 F (37 C), temperature source Oral, resp. rate 20, height 5\' 5"  (1.651 m), weight 61.644 kg (135 lb 14.4 oz), SpO2 96 %.  HEENT-  Normocephalic, no lesions, without obvious abnormality.  Normal external eye and conjunctiva.  Normal TM's bilaterally.  Normal auditory canals and external ears. Normal external nose, mucus membranes and septum.  Normal pharynx. Cardiovascular- S1, S2 normal, pulses palpable throughout   Lungs- + wheezes bilaterally Abdomen- soft, non-tender;  bowel sounds normal; no masses,  no organomegaly Extremities- no edema Lymph-no adenopathy palpable Musculoskeletal-no joint tenderness, deformity or swelling Skin-warm and dry, no hyperpigmentation, vitiligo, or suspicious lesions  Neurological Examination Mental Status: Alert, oriented, thought content appropriate.  Speech fluent without evidence of aphasia.  Able to follow 3 step commands without difficulty. Cranial Nerves: II: Discs flat bilaterally; Visual fields grossly normal, pupils equal, round, reactive to light and accommodation III,IV, VI: ptosis not present, extra-ocular motions intact bilaterally V,VII: smile symmetric, facial light touch sensation normal bilaterally VIII: hearing normal bilaterally IX,X: gag reflex present XI: bilateral shoulder shrug XII: midline tongue extension Motor: Right : Upper extremity   5/5    Left:     Upper extremity   5/5 with 5-/5 hand grip  Lower extremity   5/5     Lower extremity   5/5 Tone and bulk:normal tone throughout; no atrophy noted Sensory: Pinprick and light touch  intact throughout, bilaterally Deep Tendon Reflexes: 2+ and symmetric throughout Plantars: Right: downgoing   Left: equivocal Cerebellar: Normal finger-to-nose testing bilaterally.  Mild heel-to-shin dysmetria on the left Gait: not tested due to safety concerns    Laboratory Studies:  Basic Metabolic Panel:  Recent Labs Lab 11/24/15 0712  NA 138  K 3.8  CL 103  CO2 24  GLUCOSE 175*  BUN 17  CREATININE 1.03*  CALCIUM 8.4*    Liver Function Tests:  Recent Labs Lab 11/24/15 0712  AST 36  ALT 19  ALKPHOS 85  BILITOT 1.1  PROT 6.4*  ALBUMIN 3.3*    Recent Labs Lab 11/24/15 0712  LIPASE 16   No results for input(s): AMMONIA in the last 168 hours.  CBC:  Recent Labs Lab 11/24/15 0712  WBC 7.1  NEUTROABS 4.5  HGB 13.7  HCT 41.9  MCV 89.4  PLT 234    Cardiac Enzymes:  Recent Labs Lab 11/24/15 0712 11/24/15 1125 11/24/15 1712 11/24/15 2328  TROPONINI 0.06* 0.04* 0.03 0.05*    BNP: Invalid input(s): POCBNP  CBG: No results for input(s): GLUCAP in the last 168 hours.  Microbiology: No results found for this or any previous visit.  Coagulation Studies:  Recent Labs  11/24/15 0712  LABPROT 14.4  INR 1.10    Urinalysis:  Recent Labs Lab 11/24/15 0649  COLORURINE YELLOW*  LABSPEC 1.017  PHURINE 5.0  GLUCOSEU NEGATIVE  HGBUR 1+*  BILIRUBINUR NEGATIVE  KETONESUR NEGATIVE  PROTEINUR 30*  NITRITE NEGATIVE  LEUKOCYTESUR NEGATIVE    Lipid Panel:    Component Value Date/Time   CHOL 75 11/25/2015 0510   TRIG 99 11/25/2015 0510   HDL 25* 11/25/2015 0510   CHOLHDL 3.0 11/25/2015 0510   VLDL 20 11/25/2015 0510   LDLCALC 30 11/25/2015 0510    HgbA1C:  Lab Results  Component Value Date   HGBA1C 5.6 11/25/2015    Urine Drug Screen:     Component Value Date/Time   LABOPIA NONE DETECTED 11/24/2015 0649   COCAINSCRNUR NONE DETECTED 11/24/2015 0649   LABBENZ POSITIVE* 11/24/2015 0649   AMPHETMU NONE DETECTED 11/24/2015 0649    THCU NONE DETECTED 11/24/2015 0649   LABBARB NONE DETECTED 11/24/2015 0649    Alcohol Level:  Recent Labs Lab 11/24/15 0712  ETH <5    Other results: EKG: sinus rhythm at 96 bpm.  Imaging: Mr Brain Wo Contrast  11/25/2015  CLINICAL DATA:  TIA.  Acute onset dizziness and nausea. EXAM: MRI HEAD WITHOUT CONTRAST TECHNIQUE: Multiplanar, multiecho pulse sequences of the brain and surrounding  structures were obtained without intravenous contrast. COMPARISON:  CT head 11/24/2015 FINDINGS: Acute infarct left superior cerebellum extending into the superior cerebellar peduncle and lateral to the fourth ventricle on the left. No other areas of acute infarct. Negative for hemorrhage. Patchy hyperintensity in the cerebral right white matter consistent with mild chronic microvascular ischemia. Brainstem intact. Negative for mass or edema Ventricle size normal.  Cerebral volume normal for age. Mild mucosal edema paranasal sinuses. Pituitary and skull base normal. IMPRESSION: Acute infarct in the left superior cerebellum medially. No associated hemorrhage Mild chronic microvascular ischemia. Electronically Signed   By: Franchot Gallo M.D.   On: 11/25/2015 14:17   US Carotid Bilateral  11/25/2015  CLINICAL DATA:  TIA. EXAM: BILATERAL CAROTID DUPLEX ULTRASOUND TECHNIQUE: Pearline Cables scale imaging, color Doppler and duplex ultrasound were performed of bilateral carotid and vertebral arteries in the neck. COMPARISON:  CT 11/24/2015 . FINDINGS: Criteria: Quantification of carotid stenosis is based on velocity parameters that correlate the residual internal carotid diameter with NASCET-based stenosis levels, using the diameter of the distal internal carotid lumen as the denominator for stenosis measurement. The following velocity measurements were obtained: RIGHT ICA:  63/9 cm/sec CCA:  99991111 cm/sec SYSTOLIC ICA/CCA RATIO:  0.6 DIASTOLIC ICA/CCA RATIO:  0.7 ECA:  80 cm/sec LEFT ICA:  73/17 cm/sec CCA:  [id number] cm/sec SYSTOLIC  ICA/CCA RATIO:  0.6 DIASTOLIC ICA/CCA RATIO:  0.9 ECA:  0.9 cm/sec RIGHT CAROTID ARTERY: Mild to moderate right distal common carotid and carotid bifurcation atherosclerotic vascular disease. No flow limiting stenosis. RIGHT VERTEBRAL ARTERY:  Patent with antegrade flow. LEFT CAROTID ARTERY: Mild moderate left distal common carotid carotid bifurcation atherosclerotic vascular disease. No flow limiting stenosis. LEFT VERTEBRAL ARTERY: Questionable retrograde flow left vertebral artery. IMPRESSION: 1. Mild moderate bilateral distal common carotid and carotid bifurcation atherosclerotic vascular disease. No flow limiting stenosis . Degree of stenosis less than 50% bilaterally. 2. Questionable retrograde flow left vertebral artery, subclavian steal cannot be excluded. Electronically Signed   By: Marcello Moores  Register   On: 11/25/2015 11:40    Assessment: 70 y.o. female presenting with acute onset vertigo.  MRI of the brain personally reviewed and shows an acute left superior cerebellar infarct.  Patient on ASA at home.  Carotid dopplers show no evidence of hemodynamically significant stenosis.  Echocardiogram shows no cardiac source of emboli with an EF of <20%.  A1c 5.6, LDL 30.  Stroke Risk Factors - hypertension and smoking  Plan: 1. PT consult, OT consult, Speech consult 2. Prophylactic therapy-Antiplatelet med: Plavix - dose 75mg  daily 3. Telemetry monitoring 4. Frequent neuro checks 5. Follow up with neurology on an outpatient basis 6. Smoking cessation counseling  Alexis Goodell, MD Neurology [phone] 11/26/2015, 2:10 PM

## 2015-11-26 NOTE — Evaluation (Signed)
Occupational Therapy Evaluation Patient Details Name: MOZEL PSENCIK MRN: [id number] DOB: 10/04/1945 Today's Date: 11/26/2015    History of Present Illness Harris Deleonardis is a 70 y.o. female with a known history of COPD and non-option requiring, combined systolic and diastolic congestive heart failure who is presenting with acute onset dizziness and nausea. She describes acute setting around 6 AM this morning at rest room spinning sensation with nausea.   MRI shows acute infarct in the left superior cerebellum medially   Clinical Impression   Patient seen for OT evaluation this date.  She presents with muscle weakness on the LUE, decreased coordination both gross and [place] on the left UE, decreased balance, decreased transfers, functional mobility and decreased ability to perform self care, ADL, and IADL tasks.  She would benefit from skilled OT to maximize safety and independence in daily tasks.  She will likely require short term rehab prior to returning home since she is home alone during the day and will need assistance with self care tasks at this time.      Follow Up Recommendations  SNF    Equipment Recommendations       Recommendations for Other Services       Precautions / Restrictions Precautions Precautions: Fall Precaution Comments: Mod Restrictions Weight Bearing Restrictions: No      Mobility Bed Mobility Overal bed mobility: Modified Independent             General bed mobility comments: uses rails  Transfers Overall transfer level: Needs assistance Equipment used: Rolling walker (2 wheeled) Transfers: Sit to/from Stand Sit to Stand: Min assist         General transfer comment: decreased balance at times, tends to lean to the left.    Balance Overall balance assessment: Needs assistance Sitting-balance support: Feet supported Sitting balance-Leahy Scale: Good   Postural control: Left lateral lean Standing balance support: Bilateral upper  extremity supported Standing balance-Leahy Scale: Poor                              ADL Overall ADL's : Needs assistance/impaired Eating/Feeding: Set up;Minimal assistance (min assist to open containers at times)   Grooming: Set up;Standing;Minimal assistance Grooming Details (indicate cue type and reason): min assist for balance going to and from the sink with rolling walker, one loss of balance this date with assist to recover.          Upper Body Dressing : Set up   Lower Body Dressing: Set up;Minimal assistance Lower Body Dressing Details (indicate cue type and reason): min assist for balance to stand and negotiate clothing Toilet Transfer: Set up;Minimal assistance   Toileting- Clothing Manipulation and Hygiene: Set up;Minimal assistance       Functional mobility during ADLs: Minimal assistance;Min guard    Patient able to don and doff socks from a seated position with increased time allowed and setup.  Minimal assistance for balance to perform underwear/pants and to negotiate clothing over hips in standing.  She was able to ambulate to the sink for oral and grooming care with minimal assist for balance this date.      Vision     Perception     Praxis      Pertinent Vitals/Pain Pain Assessment: 0-10 Pain Score: 5  Pain Location: headache Pain Descriptors / Indicators: Aching Pain Intervention(s): Limited activity within patient's tolerance;Monitored during session;RN gave pain meds during session     Hand Dominance Right  Extremity/Trunk Assessment Upper Extremity Assessment Upper Extremity Assessment: LUE deficits/detail LUE Deficits / Details: LUE ROM WFLs, strength 3+/5 overall but has decreased coordination for both fine and gross motor, impaired finger to nose and rapid alternating movements.  LUE Coordination: decreased fine motor;decreased gross motor   Lower Extremity Assessment Lower Extremity Assessment: Defer to PT evaluation        Communication     Cognition Arousal/Alertness: Awake/alert Behavior During Therapy: WFL for tasks assessed/performed Overall Cognitive Status: Impaired/Different from baseline Area of Impairment: Attention   Current Attention Level: Selective Memory: Decreased short-term memory         General Comments: lethargic but able to participate,    General Comments       Exercises Exercises: General Lower Extremity     Shoulder Instructions      Home Living Family/patient expects to be discharged to:: Private residence Living Arrangements: Spouse/significant other Available Help at Discharge: Available PRN/intermittently Type of Home: House Home Access: Stairs to enter     Home Layout: One level     Bathroom Shower/Tub: Corporate investment banker: Standard                Prior Functioning/Environment Level of Independence: Independent             OT Diagnosis: Generalized weakness;Acute pain;Ataxia;Cognitive deficits   OT Problem List: Decreased strength;Impaired balance (sitting and/or standing);Decreased cognition;Pain;Decreased safety awareness;Decreased coordination;Impaired UE functional use   OT Treatment/Interventions: Self-care/ADL training;Therapeutic exercise;Patient/family education;Balance training;Neuromuscular education;Therapeutic activities;DME and/or AE instruction;Cognitive remediation/compensation    OT Goals(Current goals can be found in the care plan section) Acute Rehab OT Goals Patient Stated Goal: wants to be able to go back home again and take care of herself  OT Goal Formulation: With patient Potential to Achieve Goals: Good  OT Frequency: Min 1X/week   Barriers to D/C:            Co-evaluation              End of Session Equipment Utilized During Treatment: Gait belt;Oxygen;Rolling walker  Activity Tolerance: Patient tolerated treatment well Patient left: in chair;with call bell/phone within reach;with  chair alarm set   Time: [id number] OT Time Calculation (min): 28 min Charges:  OT General Charges $OT Visit: 1 Procedure OT Evaluation $OT Eval Low Complexity: 1 Procedure OT Treatments $Self Care/Home Management : 8-22 mins G-Codes:    Lovett,Amy  Amy T Lovett, OTR/L, CLT  11/26/2015, 11:43 AM

## 2015-11-26 NOTE — Progress Notes (Signed)
Physical Therapy Treatment Patient Details Name: Sandra Ellison MRN: [id number] DOB: 01-12-1946 Today's Date: 11/26/2015    History of Present Illness [name] is a 70 y.o. female with a known history of COPD and non-option requiring, combined systolic and diastolic congestive heart failure who is presenting with acute onset dizziness and nausea. She describes acute setting around 6 AM this morning at rest room spinning sensation with nausea.   MRI shows acute infarct in the left superior cerebellum medially    PT Comments    Pt in bed ready for session.  Able to transfer to edge of bed with rails without assist.  She was able to stand at bedside with min assist and overall poor safety for hand placements and walker use.  She was able to ambulate in room 20' with walker and min a x 1 for balance and control of walker.  She ran into objects in room and needed verbal and physical cues to correct.  She had one significant loss of balance which required mod assist to recover to prevent fall.  She reports feeling unsteady on her feet.  She does c/o headache this morning and meds were requested from nursing.  She reports poor sleep again last night with eyes mostly closed during session. She is unsafe to ambulate and transfer by herself.  Limited by fatigue.     Follow Up Recommendations  SNF     Equipment Recommendations       Recommendations for Other Services       Precautions / Restrictions Precautions Precautions: Fall Precaution Comments: Mod Restrictions Weight Bearing Restrictions: No    Mobility  Bed Mobility Overal bed mobility: Modified Independent             General bed mobility comments: uses rails  Transfers Overall transfer level: Needs assistance Equipment used: Rolling walker (2 wheeled) Transfers: Sit to/from Stand Sit to Stand: Min assist (poor hand placements)            Ambulation/Gait Ambulation/Gait assistance: Min assist;Mod assist (mod  a for lob) Ambulation Distance (Feet): 20 Feet Assistive device: Rolling walker (2 wheeled) Gait Pattern/deviations: Step-through pattern;Staggering left   Gait velocity interpretation: <1.8 ft/sec, indicative of risk for recurrent falls General Gait Details: gait somewhat improved today but continues with poor quality, poor rw use and genereally unsafe.  lob x 1 which needed assist to prevent fall.   Stairs            Wheelchair Mobility    Modified Rankin (Stroke Patients Only)       Balance Overall balance assessment: Needs assistance Sitting-balance support: Feet supported Sitting balance-Leahy Scale: Good     Standing balance support: Bilateral upper extremity supported Standing balance-Leahy Scale: Poor                      Cognition Arousal/Alertness: Lethargic Behavior During Therapy: WFL for tasks assessed/performed Overall Cognitive Status: Impaired/Different from baseline Area of Impairment: Attention   Current Attention Level: Selective Memory: Decreased short-term memory         General Comments: lethargic but able to participate,     Exercises General Exercises - Lower Extremity Ankle Circles/Pumps: AROM;Both;10 reps;Seated Long Arc Quad: AROM;Both;10 reps;Seated Hip Flexion/Marching: AROM;Both;10 reps;Seated    General Comments        Pertinent Vitals/Pain Pain Assessment: 0-10 Pain Score: 5  Pain Location: headache    Home Living  Prior Function            PT Goals (current goals can now be found in the care plan section) Progress towards PT goals: Progressing toward goals    Frequency  7X/week    PT Plan Current plan remains appropriate    Co-evaluation             End of Session Equipment Utilized During Treatment: Gait belt Activity Tolerance: Patient tolerated treatment well Patient left: in chair;with call bell/phone within reach;with chair alarm set;Other (comment) (SLP in  room)     Time: [id number] PT Time Calculation (min) (ACUTE ONLY): 20 min  Charges:  $Gait Training: 8-22 mins $Therapeutic Exercise: 8-22 mins                    G Codes:      Chesley Noon, PTA 11/26/2015, 9:18 AM

## 2015-11-27 ENCOUNTER — Telehealth: Payer: Self-pay | Admitting: Internal Medicine

## 2015-11-27 DIAGNOSIS — I679 Cerebrovascular disease, unspecified: Secondary | ICD-10-CM | POA: Diagnosis not present

## 2015-11-27 DIAGNOSIS — Z743 Need for continuous supervision: Secondary | ICD-10-CM | POA: Diagnosis not present

## 2015-11-27 DIAGNOSIS — F329 Major depressive disorder, single episode, unspecified: Secondary | ICD-10-CM | POA: Diagnosis not present

## 2015-11-27 DIAGNOSIS — K219 Gastro-esophageal reflux disease without esophagitis: Secondary | ICD-10-CM | POA: Diagnosis not present

## 2015-11-27 DIAGNOSIS — I639 Cerebral infarction, unspecified: Secondary | ICD-10-CM | POA: Diagnosis not present

## 2015-11-27 DIAGNOSIS — Z720 Tobacco use: Secondary | ICD-10-CM | POA: Diagnosis not present

## 2015-11-27 DIAGNOSIS — R262 Difficulty in walking, not elsewhere classified: Secondary | ICD-10-CM | POA: Diagnosis not present

## 2015-11-27 DIAGNOSIS — F419 Anxiety disorder, unspecified: Secondary | ICD-10-CM | POA: Diagnosis not present

## 2015-11-27 DIAGNOSIS — K5900 Constipation, unspecified: Secondary | ICD-10-CM | POA: Diagnosis not present

## 2015-11-27 DIAGNOSIS — E784 Other hyperlipidemia: Secondary | ICD-10-CM | POA: Diagnosis not present

## 2015-11-27 DIAGNOSIS — F0390 Unspecified dementia without behavioral disturbance: Secondary | ICD-10-CM | POA: Diagnosis not present

## 2015-11-27 DIAGNOSIS — R1312 Dysphagia, oropharyngeal phase: Secondary | ICD-10-CM | POA: Diagnosis not present

## 2015-11-27 DIAGNOSIS — J449 Chronic obstructive pulmonary disease, unspecified: Secondary | ICD-10-CM | POA: Diagnosis not present

## 2015-11-27 DIAGNOSIS — I10 Essential (primary) hypertension: Secondary | ICD-10-CM | POA: Diagnosis not present

## 2015-11-27 DIAGNOSIS — R42 Dizziness and giddiness: Secondary | ICD-10-CM | POA: Diagnosis not present

## 2015-11-27 DIAGNOSIS — I509 Heart failure, unspecified: Secondary | ICD-10-CM | POA: Diagnosis not present

## 2015-11-27 DIAGNOSIS — E785 Hyperlipidemia, unspecified: Secondary | ICD-10-CM | POA: Diagnosis not present

## 2015-11-27 DIAGNOSIS — F3289 Other specified depressive episodes: Secondary | ICD-10-CM | POA: Diagnosis not present

## 2015-11-27 DIAGNOSIS — I6350 Cerebral infarction due to unspecified occlusion or stenosis of unspecified cerebral artery: Secondary | ICD-10-CM | POA: Diagnosis not present

## 2015-11-27 DIAGNOSIS — I63331 Cerebral infarction due to thrombosis of right posterior cerebral artery: Secondary | ICD-10-CM | POA: Diagnosis not present

## 2015-11-27 DIAGNOSIS — Z5189 Encounter for other specified aftercare: Secondary | ICD-10-CM | POA: Diagnosis not present

## 2015-11-27 DIAGNOSIS — M6281 Muscle weakness (generalized): Secondary | ICD-10-CM | POA: Diagnosis not present

## 2015-11-27 MED ORDER — OXYCODONE HCL 5 MG PO TABS: 5.0000 mg | ORAL_TABLET | ORAL | Status: DC | PRN

## 2015-11-27 MED ORDER — MECLIZINE HCL 12.5 MG PO TABS: 12.5000 mg | ORAL_TABLET | Freq: Three times a day (TID) | ORAL | Status: DC | PRN

## 2015-11-27 MED ORDER — ALPRAZOLAM 0.25 MG PO TABS
0.2500 mg | ORAL_TABLET | Freq: Once | ORAL | Status: AC
  Administered 2015-11-27: 04:00:00 0.25 mg via ORAL
  Filled 2015-11-27: qty 1

## 2015-11-27 MED ORDER — CLOPIDOGREL BISULFATE 75 MG PO TABS: 75.0000 mg | ORAL_TABLET | Freq: Every day | ORAL | Status: DC

## 2015-11-27 MED ORDER — GABAPENTIN 300 MG PO CAPS
300.0000 mg | ORAL_CAPSULE | Freq: Three times a day (TID) | ORAL | Status: DC
  Administered 2015-11-27 (×2): 300 mg via ORAL
  Filled 2015-11-27 (×2): qty 1

## 2015-11-27 MED ORDER — ALPRAZOLAM 0.25 MG PO TABS
0.2500 mg | ORAL_TABLET | Freq: Three times a day (TID) | ORAL | Status: DC | PRN
  Administered 2015-11-27: 11:00:00 0.25 mg via ORAL
  Filled 2015-11-27: qty 1

## 2015-11-27 NOTE — Clinical Social Work Note (Signed)
Pt is ready for discharge today to [facility]. Pt and granddaughter are aware and agreeable to discharge plan. RN to call report and EMS to provide transportation. Facility is ready to accept pt as they have received discharge information. Awaiting [facility] auth for placement. CSW is signing off as no further needs identified.   Sandra Ellison, MSW, LCSW  Clinical Social Worker  [phone]

## 2015-11-27 NOTE — Progress Notes (Signed)
[facility] authorization has been received. Auth # [id number]. RN aware.   Blima Rich, LCSW [phone]

## 2015-11-27 NOTE — Telephone Encounter (Signed)
Noted. Will continue to follow as approporiate.

## 2015-11-27 NOTE — Progress Notes (Signed)
Patient alert and oriented, she does talk very low, Patient was medicated for right shoulder pain this morning with relief, and Patient with anxiety and nurse administered xanax .25, Patient states that it did help. Patient up to bedside commode with one assist, she states she feels week on her left side, although grips moderate to strong, but she states she leans to the left it feels to her, patient is weak, but can manage with minimal assist for short distance. Patient has fair appetite, 02 via [place] at 2 liters intact. Patient will be discharged to rehab center today, and Patient is aware, but states that it makes her nervous, and hopes to go home soon and be back like she use to be.

## 2015-11-27 NOTE — Progress Notes (Signed)
Patient oriented, ate breakfast, v/s stable, nurse had call from telemetry and she had a 4 beat run of V-tach. Patient without signs of distress.

## 2015-11-27 NOTE — Telephone Encounter (Signed)
HFU, Pt is being discharged from the hospital today. Dx is Stroke. Pt is scheduled for 06/26 @ 1:30pm. Thank you!

## 2015-11-27 NOTE — Progress Notes (Signed)
Notified MD regarding V-tach. Just monitor.

## 2015-11-27 NOTE — Discharge Summary (Signed)
Sandra Ellison NAME: Sandra Ellison    MR#:  [id number]  DATE OF BIRTH:  25-Apr-1946  DATE OF ADMISSION:  11/24/2015 ADMITTING PHYSICIAN: Lytle Butte, MD  DATE OF DISCHARGE: 11/27/2015  PRIMARY CARE PHYSICIAN: Einar Pheasant, MD    ADMISSION DIAGNOSIS:  Elevated troponin [R79.89] Acute onset of severe vertigo [R42]  DISCHARGE DIAGNOSIS:  Ischemic stroke involving cerebellum  SECONDARY DIAGNOSIS:   Past Medical History  Diagnosis Date  . Chronic combined systolic and diastolic CHF (congestive heart failure) ([place])     a. 01/2012 Echo: EF 30-35%, mod conc LVH, sev inf HK, mildly dil LA, mild-mod MR, Trace TR, RVSP 40-62mmHg.    . Dilated cardiomyopathy ([place])     a. 02/2011 MV: EF 27%, anteroseptal, inf, inferolateral scar, no ischemia.  Marland Kitchen COPD (chronic obstructive pulmonary disease) ([place])   . Hx of colonic polyps   . Iron deficiency anemia     a. h/o transfusions, prev followed by heme/onc.  . Chronic kidney disease (CKD), stage II (mild)   . Pulmonary HTN ([place])   . Arthritis   . Allergy   . History of diverticulosis   . History of abnormal Pap smear     a. s/p vaginal hysterectomy  . GERD (gastroesophageal reflux disease)   . Hyperkalemia     a. 02/2015 in setting of entresto/AKI->entresto d/c'd.  . Carotid arterial disease ([place])     a. 02/2015 Carotid U/S: RICA [id number], LICA [id number].  . Tobacco abuse   . Hypertension     HOSPITAL COURSE:  Sandra Ellison  is a 70 y.o. female admitted 11/24/2015 with chief complaint Dizziness and Nausea . Please see H&P performed by Lytle Butte, MD for further information. Patient presented with the above symptoms. Given concern for CVA underwent MRI revealing acute CVA. Patient evaluated by neurology. Carotid dopplers show no evidence of hemodynamically significant stenosis. Echocardiogram shows no cardiac source of emboli with an EF of <20%. A1c 5.6, LDL 30. Medications changed to  plavix from asa. PT recommend SNF  DISCHARGE CONDITIONS:   stable  CONSULTS OBTAINED:  Treatment Team:  Alexis Goodell, MD  DRUG ALLERGIES:   Allergies  Allergen Reactions  . Ranitidine Anaphylaxis  . Ranitidine Hcl Anaphylaxis and Swelling  . Entresto [Sacubitril-Valsartan] Other (See Comments)    Potassium issues    DISCHARGE MEDICATIONS:   Current Discharge Medication List    START taking these medications   Details  clopidogrel (PLAVIX) 75 MG tablet Take 1 tablet (75 mg total) by mouth daily. Qty: 30 tablet, Refills: 0    meclizine (ANTIVERT) 12.5 MG tablet Take 1 tablet (12.5 mg total) by mouth 3 (three) times daily as needed for dizziness. Qty: 30 tablet, Refills: 0    oxyCODONE (OXY IR/ROXICODONE) 5 MG immediate release tablet Take 1 tablet (5 mg total) by mouth every 4 (four) hours as needed for moderate pain. Qty: 30 tablet, Refills: 0      CONTINUE these medications which have NOT CHANGED   Details  albuterol (PROVENTIL HFA;VENTOLIN HFA) 108 (90 Base) MCG/ACT inhaler Inhale 2 puffs into the lungs every 6 (six) hours as needed for wheezing or shortness of breath. Qty: 1 Inhaler, Refills: 3    albuterol (PROVENTIL) (2.5 MG/3ML) 0.083% nebulizer solution Take 3 mLs by nebulization every 4 (four) hours as needed. For wheezing/shortness of breath.    atorvastatin (LIPITOR) 40 MG tablet Take 1 tablet (40 mg total) by mouth daily. Qty: 30  tablet, Refills: 11    carvedilol (COREG) 6.25 MG tablet Take 6.25 mg by mouth 2 (two) times daily with a meal. Refills: 3    citalopram (CELEXA) 10 MG tablet TAKE 1 TABLET (10 MG TOTAL) BY MOUTH DAILY. Qty: 30 tablet, Refills: 3    Fe Fum-FePoly-Vit C-Vit B3 (INTEGRA) 62.5-62.5-40-3 MG CAPS One per day Qty: 30 capsule, Refills: 2    furosemide (LASIX) 20 MG tablet TAKE 1 TABLET (20 MG TOTAL) BY MOUTH DAILY AS NEEDED. Qty: 30 tablet, Refills: 6    gabapentin (NEURONTIN) 100 MG capsule TAKE 1 CAPSULE (100 MG TOTAL) BY  MOUTH 3 (THREE) TIMES DAILY AS NEEDED. Qty: 90 capsule, Refills: 1    lisinopril (PRINIVIL,ZESTRIL) 10 MG tablet Take 10 mg by mouth daily. Refills: 3    pantoprazole (PROTONIX) 40 MG tablet TAKE 1 TABLET (40 MG TOTAL) BY MOUTH DAILY. Qty: 30 tablet, Refills: 11    tiotropium (SPIRIVA HANDIHALER) 18 MCG inhalation capsule One inhalation daily Qty: 30 capsule, Refills: 5      STOP taking these medications     aspirin EC 81 MG tablet      polyethylene glycol (MIRALAX / GLYCOLAX) packet      hydrALAZINE (APRESOLINE) 25 MG tablet          DISCHARGE INSTRUCTIONS:    DIET:  Dysphagia 3(mech soft - cut meats, moistened), thin liquids; aspiration precautions; reflux precautions; tray setup assistance d/t weakness overall. Rest breaks during meals as needed to avoid fatigue and SOB  DISCHARGE CONDITION:  Stable  ACTIVITY:  Activity as tolerated  OXYGEN:  Home Oxygen: No.   Oxygen Delivery: room air  DISCHARGE LOCATION:  nursing home   If you experience worsening of your admission symptoms, develop shortness of breath, life threatening emergency, suicidal or homicidal thoughts you must seek medical attention immediately by calling 911 or calling your MD immediately  if symptoms less severe.  You Must read complete instructions/literature along with all the possible adverse reactions/side effects for all the Medicines you take and that have been prescribed to you. Take any new Medicines after you have completely understood and accpet all the possible adverse reactions/side effects.   Please note  You were cared for by a hospitalist during your hospital stay. If you have any questions about your discharge medications or the care you received while you were in the hospital after you are discharged, you can call the unit and asked to speak with the hospitalist on call if the hospitalist that took care of you is not available. Once you are discharged, your primary care physician  will handle any further medical issues. Please note that NO REFILLS for any discharge medications will be authorized once you are discharged, as it is imperative that you return to your primary care physician (or establish a relationship with a primary care physician if you do not have one) for your aftercare needs so that they can reassess your need for medications and monitor your lab values.    On the day of Discharge:   VITAL SIGNS:  Blood pressure 144/69, pulse 83, temperature 97.8 F (36.6 C), temperature source Axillary, resp. rate 18, height 5\' 5"  (1.651 m), weight 135 lb 14.4 oz (61.644 kg), SpO2 99 %.  I/O:   Intake/Output Summary (Last 24 hours) at 11/27/15 0907 Last data filed at 11/26/15 2256  Gross per 24 hour  Intake    240 ml  Output    100 ml  Net    140  ml    PHYSICAL EXAMINATION:  GENERAL:  70 y.o.-year-old patient lying in the bed with no acute distress.  EYES: Pupils equal, round, reactive to light and accommodation. No scleral icterus. Extraocular muscles intact.  HEENT: Head atraumatic, normocephalic. Oropharynx and nasopharynx clear.  NECK:  Supple, no jugular venous distention. No thyroid enlargement, no tenderness.  LUNGS: Normal breath sounds bilaterally, no wheezing, rales,rhonchi or crepitation. No use of accessory muscles of respiration.  CARDIOVASCULAR: S1, S2 normal. No murmurs, rubs, or gallops.  ABDOMEN: Soft, non-tender, non-distended. Bowel sounds present. No organomegaly or mass.  EXTREMITIES: No pedal edema, cyanosis, or clubbing.  NEUROLOGIC: Cranial nerves II through XII are intact. Muscle strength 3/5 in all extremities. Sensation intact. Gait not checked.  PSYCHIATRIC: The patient is alert and oriented x 3.  SKIN: No obvious rash, lesion, or ulcer.   DATA REVIEW:   CBC  Recent Labs Lab 11/24/15 0712  WBC 7.1  HGB 13.7  HCT 41.9  PLT 234    Chemistries   Recent Labs Lab 11/24/15 0712  NA 138  K 3.8  CL 103  CO2 24    GLUCOSE 175*  BUN 17  CREATININE 1.03*  CALCIUM 8.4*  AST 36  ALT 19  ALKPHOS 85  BILITOT 1.1    Cardiac Enzymes  Recent Labs Lab 11/24/15 2328  TROPONINI 0.05*    Microbiology Results  No results found for this or any previous visit.  RADIOLOGY:  Mr Herby Abraham Contrast  11/25/2015  CLINICAL DATA:  TIA.  Acute onset dizziness and nausea. EXAM: MRI HEAD WITHOUT CONTRAST TECHNIQUE: Multiplanar, multiecho pulse sequences of the brain and surrounding structures were obtained without intravenous contrast. COMPARISON:  CT head 11/24/2015 FINDINGS: Acute infarct left superior cerebellum extending into the superior cerebellar peduncle and lateral to the fourth ventricle on the left. No other areas of acute infarct. Negative for hemorrhage. Patchy hyperintensity in the cerebral right white matter consistent with mild chronic microvascular ischemia. Brainstem intact. Negative for mass or edema Ventricle size normal.  Cerebral volume normal for age. Mild mucosal edema paranasal sinuses. Pituitary and skull base normal. IMPRESSION: Acute infarct in the left superior cerebellum medially. No associated hemorrhage Mild chronic microvascular ischemia. Electronically Signed   By: Franchot Gallo M.D.   On: 11/25/2015 14:17   US Carotid Bilateral  11/25/2015  CLINICAL DATA:  TIA. EXAM: BILATERAL CAROTID DUPLEX ULTRASOUND TECHNIQUE: Pearline Cables scale imaging, color Doppler and duplex ultrasound were performed of bilateral carotid and vertebral arteries in the neck. COMPARISON:  CT 11/24/2015 . FINDINGS: Criteria: Quantification of carotid stenosis is based on velocity parameters that correlate the residual internal carotid diameter with NASCET-based stenosis levels, using the diameter of the distal internal carotid lumen as the denominator for stenosis measurement. The following velocity measurements were obtained: RIGHT ICA:  63/9 cm/sec CCA:  [id number] cm/sec SYSTOLIC ICA/CCA RATIO:  0.6 DIASTOLIC ICA/CCA RATIO:  0.7  ECA:  80 cm/sec LEFT ICA:  73/17 cm/sec CCA:  [id number] cm/sec SYSTOLIC ICA/CCA RATIO:  0.6 DIASTOLIC ICA/CCA RATIO:  0.9 ECA:  0.9 cm/sec RIGHT CAROTID ARTERY: Mild to moderate right distal common carotid and carotid bifurcation atherosclerotic vascular disease. No flow limiting stenosis. RIGHT VERTEBRAL ARTERY:  Patent with antegrade flow. LEFT CAROTID ARTERY: Mild moderate left distal common carotid carotid bifurcation atherosclerotic vascular disease. No flow limiting stenosis. LEFT VERTEBRAL ARTERY: Questionable retrograde flow left vertebral artery. IMPRESSION: 1. Mild moderate bilateral distal common carotid and carotid bifurcation atherosclerotic vascular disease. No flow limiting stenosis . Degree  of stenosis less than 50% bilaterally. 2. Questionable retrograde flow left vertebral artery, subclavian steal cannot be excluded. Electronically Signed   By: Marcello Moores  Register   On: 11/25/2015 11:40     Management plans discussed with the patient, family and they are in agreement.  CODE STATUS:     Code Status Orders        Start     Ordered   11/24/15 0804  Full code   Continuous     11/24/15 0803    Code Status History    Date Active Date Inactive Code Status Order ID Comments User Context   03/06/2015  9:35 PM 03/07/2015  8:30 PM Full Code [id number]  Lance Coon, MD Inpatient      TOTAL TIME TAKING CARE OF THIS PATIENT: 32 minutes.    Mariann Palo,  Karenann Cai.D on 11/27/2015 at 9:07 AM  Between 7am to 6pm - Pager - [phone]  After 6pm go to [email or web address] - Proofreader  [facility]  Office  [phone]  CC: Primary care physician; Einar Pheasant, MD

## 2015-11-27 NOTE — Progress Notes (Signed)
Pt expressing severe anxiety over going to a SNF today.  She doesn't want to have to rely on anyone for anything.  Patient asked for something for this anxiety.  Dr. Conley Canal ordered a one time dose of xanax.

## 2015-11-27 NOTE — Progress Notes (Signed)
Called report to Reggie Pile, RN at [facility].  Pt is going to Rm [id number].  Pt's IV removed by nursing student and tele removed.  Pt placed in transfer pack and EMS called for transport.

## 2015-11-27 NOTE — Clinical Social Work Placement (Signed)
   CLINICAL SOCIAL WORK PLACEMENT  NOTE  Date:  11/27/2015  Patient Details  Name: Sandra Ellison MRN: [id number] Date of Birth: 03-28-1946  Clinical Social Work is seeking post-discharge placement for this patient at the [place] level of care (*CSW will initial, date and re-position this form in  chart as items are completed):  Yes   Patient/family provided with [facility] Work Department's list of facilities offering this level of care within the geographic area requested by the patient (or if unable, by the patient's family).  Yes   Patient/family informed of their freedom to choose among providers that offer the needed level of care, that participate in Medicare, Medicaid or managed care program needed by the patient, have an available bed and are willing to accept the patient.  Yes   Patient/family informed of [facility]'s ownership interest in [facility] and [facility], as well as of the fact that they are under no obligation to receive care at these facilities.  PASRR submitted to EDS on 11/26/15     PASRR number received on 11/26/15     Existing PASRR number confirmed on       FL2 transmitted to all facilities in geographic area requested by pt/family on 11/19/15     FL2 transmitted to all facilities within larger geographic area on       Patient informed that his/her managed care company has contracts with or will negotiate with certain facilities, including the following:        Yes   Patient/family informed of bed offers received.  Patient chooses bed at [facility]     Physician recommends and patient chooses bed at  [facility])    Patient to be transferred to [facility] on 11/27/15.  Patient to be transferred to facility by [facility]     Patient family notified on 11/27/15 of transfer.  Name of family member notified:  Britnany, granddaughter     PHYSICIAN       Additional Comment:     _______________________________________________ Darden Dates, LCSW 11/27/2015, 4:56 PM

## 2015-11-27 NOTE — Clinical Social Work Note (Signed)
Clinical Social Work Assessment  Patient Details  Name: Sandra Ellison MRN: [id number] Date of Birth: 11-21-1945  Date of referral:  11/26/2015     Reason for consult:  Facility Placement                Permission sought to share information with:  Family Supports Permission granted to share information::     Name::     Brittnany, Granddaughter   Housing/Transportation Living arrangements for the past 2 months:  Single Family Home Source of Information:  Patient, Other (Comment Required) Loss adjuster, chartered) Patient Interpreter Needed:  None Criminal Activity/Legal Involvement Pertinent to Current Situation/Hospitalization:  No - Comment as needed Significant Relationships:  Other Family Members Lives with:  Self Do you feel safe going back to the place where you live?  Yes Need for family participation in patient care:  Yes (Comment)  Care giving concerns:  No caregiving concerns identified.   Social Worker assessment / plan:  CSW met with pt and granddaughter to address consult for New SNF. CSW introduced herself and explained role of social work. CSW also explained the process of discharging to SNF with a managed medicare. PT is recommeding STR at SNF. Pt and granddaughter are agreeable to placement. CSW initiated a SNF search and will follow up with bed offers. CSW will continue to follow.   Employment status:  Retired Nurse, adult PT Recommendations:  [place] / Referral to community resources:  [place]  Patient/Family's Response to care:  Pt was a Patent attorney of CSW support.   Patient/Family's Understanding of and Emotional Response to Diagnosis, Current Treatment, and Prognosis:  Pt understands that STR at SNF will benefit pt prior to returning home  Emotional Assessment Appearance:  Appears stated age Attitude/Demeanor/Rapport:  Other (Appropriate) Affect (typically observed):  Pleasant Orientation:   Oriented to Self, Oriented to Place, Oriented to  Time, Oriented to Situation Alcohol / Substance use:  Never Used Psych involvement (Current and /or in the community):  No (Comment)  Discharge Needs  Concerns to be addressed:  Adjustment to Illness Readmission within the last 30 days:  No Current discharge risk:  None Barriers to Discharge:  Continued Medical Work up   [employer], LCSW 11/27/2015, 4:50 PM

## 2015-11-27 NOTE — Progress Notes (Signed)
Physical Therapy Treatment Patient Details Name: Sandra Ellison MRN: [id number] DOB: May 17, 1946 Today's Date: 11/27/2015    History of Present Illness Sandra Ellison is a 70 y.o. female with a known history of COPD and non-option requiring, combined systolic and diastolic congestive heart failure who is presenting with acute onset dizziness and nausea. She describes acute setting around 6 AM this morning at rest room spinning sensation with nausea.   MRI shows acute infarct in the left superior cerebellum medially    PT Comments    Pt lethargic, sleeping with blinds drawn in the middle of the day.  Lights turned on and blinds opened to stimulate pt.  Pt with improved balance during gait with 1 LOB needing Min A to recover.  Gait continues to be slow with drifting to L side, unable to maintain steady cadence.    Follow Up Recommendations  SNF     Equipment Recommendations       Recommendations for Other Services       Precautions / Restrictions Precautions Precautions: Fall    Mobility  Bed Mobility Overal bed mobility: Modified Independent             General bed mobility comments: uses rails  Transfers Overall transfer level: Needs assistance Equipment used: Rolling walker (2 wheeled) Transfers: Sit to/from Stand Sit to Stand: Min guard         General transfer comment: B hands on RW to pull up.  Ambulation/Gait Ambulation/Gait assistance: Min assist Ambulation Distance (Feet): 60 Feet Assistive device: Rolling walker (2 wheeled) Gait Pattern/deviations: Staggering left;Step-to pattern;Narrow base of support     General Gait Details: Gait slow with staggering to L side and feet closer to middle but drifting to L, kicking RW with L foot; decreased balance with turns   Stairs            Wheelchair Mobility    Modified Rankin (Stroke Patients Only)       Balance Overall balance assessment: Needs assistance Sitting-balance support: Feet  supported Sitting balance-Leahy Scale: Good   Postural control: Left lateral lean Standing balance support: Bilateral upper extremity supported;During functional activity Standing balance-Leahy Scale: Poor                      Cognition Arousal/Alertness: Lethargic Behavior During Therapy: WFL for tasks assessed/performed Overall Cognitive Status: Impaired/Different from baseline Area of Impairment: Attention   Current Attention Level: Sustained       Awareness: Emergent   General Comments: Xanax earlier this morning for anxiety, sleeping deeply with mouth open, fairly easy to arouse.    Exercises      General Comments        Pertinent Vitals/Pain Pain Assessment: No/denies pain    Home Living                      Prior Function            PT Goals (current goals can now be found in the care plan section) Acute Rehab PT Goals Patient Stated Goal: wants to be able to go back home again and take care of herself  Progress towards PT goals: Progressing toward goals    Frequency  7X/week    PT Plan Current plan remains appropriate    Co-evaluation             End of Session Equipment Utilized During Treatment: Gait belt Activity Tolerance: Patient tolerated treatment well Patient left: in  bed;with call bell/phone within reach;with bed alarm set     Time: 1450-1507 PT Time Calculation (min) (ACUTE ONLY): 17 min  Charges:  $Gait Training: 8-22 mins                    G Codes:      Sandra Ellison, PT 21-Dec-2015, 3:13 PM

## 2015-11-27 NOTE — Care Management Important Message (Signed)
Important Message  Patient Details  Name: Sandra Ellison MRN: [id number] Date of Birth: 03/22/1946   Medicare Important Message Given:  Yes    [name] 11/27/2015, 9:33 AM

## 2015-11-28 ENCOUNTER — Telehealth: Payer: Self-pay

## 2015-11-28 NOTE — Telephone Encounter (Signed)
Unable to reach patient.  No answer. No voicemail.  Will continue to follow as appropriate with transitional care management.  Hospital appointment scheduled with PCP.

## 2015-11-28 NOTE — Telephone Encounter (Signed)
Granddaughter (HIPPA compliant) reports patient has been transferred to SNF.  Current appointment scheduled on 12/06/15 will be held.  Bethany to cancel if not discharged home by 12/05/15.  Will continue to follow up with transitional care management as appropriate.

## 2015-11-30 DIAGNOSIS — I10 Essential (primary) hypertension: Secondary | ICD-10-CM | POA: Diagnosis not present

## 2015-11-30 DIAGNOSIS — F419 Anxiety disorder, unspecified: Secondary | ICD-10-CM | POA: Diagnosis not present

## 2015-11-30 DIAGNOSIS — J449 Chronic obstructive pulmonary disease, unspecified: Secondary | ICD-10-CM | POA: Diagnosis not present

## 2015-11-30 DIAGNOSIS — F0390 Unspecified dementia without behavioral disturbance: Secondary | ICD-10-CM | POA: Diagnosis not present

## 2015-11-30 DIAGNOSIS — E784 Other hyperlipidemia: Secondary | ICD-10-CM | POA: Diagnosis not present

## 2015-11-30 DIAGNOSIS — K219 Gastro-esophageal reflux disease without esophagitis: Secondary | ICD-10-CM | POA: Diagnosis not present

## 2015-12-02 ENCOUNTER — Ambulatory Visit: Payer: Self-pay | Admitting: Internal Medicine

## 2015-12-06 ENCOUNTER — Ambulatory Visit: Payer: Self-pay | Admitting: Internal Medicine

## 2015-12-06 ENCOUNTER — Other Ambulatory Visit: Payer: Self-pay | Admitting: Internal Medicine

## 2015-12-07 DIAGNOSIS — K5900 Constipation, unspecified: Secondary | ICD-10-CM | POA: Diagnosis not present

## 2015-12-07 DIAGNOSIS — K219 Gastro-esophageal reflux disease without esophagitis: Secondary | ICD-10-CM | POA: Diagnosis not present

## 2015-12-07 DIAGNOSIS — I10 Essential (primary) hypertension: Secondary | ICD-10-CM | POA: Diagnosis not present

## 2015-12-07 DIAGNOSIS — E784 Other hyperlipidemia: Secondary | ICD-10-CM | POA: Diagnosis not present

## 2015-12-07 DIAGNOSIS — R42 Dizziness and giddiness: Secondary | ICD-10-CM | POA: Diagnosis not present

## 2015-12-11 ENCOUNTER — Other Ambulatory Visit: Payer: Self-pay | Admitting: Internal Medicine

## 2015-12-15 DIAGNOSIS — K219 Gastro-esophageal reflux disease without esophagitis: Secondary | ICD-10-CM | POA: Diagnosis not present

## 2015-12-15 DIAGNOSIS — F419 Anxiety disorder, unspecified: Secondary | ICD-10-CM | POA: Diagnosis not present

## 2015-12-15 DIAGNOSIS — I63331 Cerebral infarction due to thrombosis of right posterior cerebral artery: Secondary | ICD-10-CM | POA: Diagnosis not present

## 2015-12-15 DIAGNOSIS — F329 Major depressive disorder, single episode, unspecified: Secondary | ICD-10-CM | POA: Diagnosis not present

## 2015-12-15 DIAGNOSIS — I10 Essential (primary) hypertension: Secondary | ICD-10-CM | POA: Diagnosis not present

## 2015-12-15 DIAGNOSIS — E784 Other hyperlipidemia: Secondary | ICD-10-CM | POA: Diagnosis not present

## 2015-12-18 ENCOUNTER — Telehealth: Payer: Self-pay | Admitting: Internal Medicine

## 2015-12-18 DIAGNOSIS — I272 Other secondary pulmonary hypertension: Secondary | ICD-10-CM | POA: Diagnosis not present

## 2015-12-18 DIAGNOSIS — R1312 Dysphagia, oropharyngeal phase: Secondary | ICD-10-CM | POA: Diagnosis not present

## 2015-12-18 DIAGNOSIS — I5042 Chronic combined systolic (congestive) and diastolic (congestive) heart failure: Secondary | ICD-10-CM | POA: Diagnosis not present

## 2015-12-18 DIAGNOSIS — I69354 Hemiplegia and hemiparesis following cerebral infarction affecting left non-dominant side: Secondary | ICD-10-CM | POA: Diagnosis not present

## 2015-12-18 DIAGNOSIS — I639 Cerebral infarction, unspecified: Secondary | ICD-10-CM

## 2015-12-18 DIAGNOSIS — E569 Vitamin deficiency, unspecified: Secondary | ICD-10-CM | POA: Diagnosis not present

## 2015-12-18 DIAGNOSIS — J449 Chronic obstructive pulmonary disease, unspecified: Secondary | ICD-10-CM | POA: Diagnosis not present

## 2015-12-18 DIAGNOSIS — I130 Hypertensive heart and chronic kidney disease with heart failure and stage 1 through stage 4 chronic kidney disease, or unspecified chronic kidney disease: Secondary | ICD-10-CM | POA: Diagnosis not present

## 2015-12-18 DIAGNOSIS — M1991 Primary osteoarthritis, unspecified site: Secondary | ICD-10-CM | POA: Diagnosis not present

## 2015-12-18 DIAGNOSIS — N183 Chronic kidney disease, stage 3 (moderate): Secondary | ICD-10-CM | POA: Diagnosis not present

## 2015-12-18 NOTE — Telephone Encounter (Signed)
Discharged yesterday from skill nursing originally had orders for home health PT, but was doing so well at discharge they are requesting Outpatient therapy instead as she has the transportation there.    Diagnosis was stroke  4/5 strength, rollator walker to ambulate,  [facility] [address] would like to go there.   thanks

## 2015-12-18 NOTE — Telephone Encounter (Signed)
Order placed for physical therapy referral.

## 2015-12-18 NOTE — Telephone Encounter (Signed)
Just need more information.  She was recently admitted to the hospital and then admitted to a skilled nursing facility.  Is she being discharged from the facility and now needs continuing outpt therapy.  If so, need diagnosis and reason for therapy so that I can order.

## 2015-12-18 NOTE — Telephone Encounter (Signed)
Left a VM for Robert to cal me back for clarity. thanks

## 2015-12-18 NOTE — Telephone Encounter (Signed)
Please advise 

## 2015-12-18 NOTE — Telephone Encounter (Signed)
Robert from [facility] called to let Dr. Nicki Reaper know that pt will need Outpatient therapy.Herbie [place] is requesting a referral to [facility]  for pt. Please call him at [phone]

## 2015-12-30 ENCOUNTER — Other Ambulatory Visit: Payer: Self-pay | Admitting: Internal Medicine

## 2015-12-30 NOTE — Telephone Encounter (Signed)
Please clarify with pt the rx for plavix.  Have not refilled previously.  It appears she was started on this in the hospital.  Is she home now?  If home and I am following now, ok to refill x 2.  Will need f/u if not already scheduled.

## 2015-12-31 NOTE — Telephone Encounter (Signed)
Left message to return call 

## 2015-12-31 NOTE — Telephone Encounter (Signed)
Plavix amt is 75 mg, Pt is home per pt.   Call pt @ [phone]. Thank you!

## 2016-01-07 ENCOUNTER — Ambulatory Visit (INDEPENDENT_AMBULATORY_CARE_PROVIDER_SITE_OTHER): Payer: Commercial Managed Care - HMO | Admitting: Cardiovascular Disease

## 2016-01-07 ENCOUNTER — Encounter: Payer: Self-pay | Admitting: Cardiovascular Disease

## 2016-01-07 VITALS — BP 110/62 | HR 64 | Ht 65.0 in | Wt 121.2 lb

## 2016-01-07 DIAGNOSIS — I5022 Chronic systolic (congestive) heart failure: Secondary | ICD-10-CM

## 2016-01-07 DIAGNOSIS — I739 Peripheral vascular disease, unspecified: Secondary | ICD-10-CM

## 2016-01-07 DIAGNOSIS — I429 Cardiomyopathy, unspecified: Secondary | ICD-10-CM

## 2016-01-07 DIAGNOSIS — I255 Ischemic cardiomyopathy: Secondary | ICD-10-CM | POA: Diagnosis not present

## 2016-01-07 DIAGNOSIS — I272 Other secondary pulmonary hypertension: Secondary | ICD-10-CM

## 2016-01-07 DIAGNOSIS — I25111 Atherosclerotic heart disease of native coronary artery with angina pectoris with documented spasm: Secondary | ICD-10-CM | POA: Diagnosis not present

## 2016-01-07 DIAGNOSIS — I5042 Chronic combined systolic (congestive) and diastolic (congestive) heart failure: Secondary | ICD-10-CM

## 2016-01-07 DIAGNOSIS — Z720 Tobacco use: Secondary | ICD-10-CM

## 2016-01-07 DIAGNOSIS — I428 Other cardiomyopathies: Secondary | ICD-10-CM

## 2016-01-07 DIAGNOSIS — I779 Disorder of arteries and arterioles, unspecified: Secondary | ICD-10-CM

## 2016-01-07 DIAGNOSIS — I639 Cerebral infarction, unspecified: Secondary | ICD-10-CM

## 2016-01-07 DIAGNOSIS — I10 Essential (primary) hypertension: Secondary | ICD-10-CM

## 2016-01-07 NOTE — Patient Instructions (Addendum)
Medication Instructions:   Please add aspirin 81 mg daily  Please slowly increase the lisinopril dose  (try 1 1/2 pills of few weeks, then up to 2 pills as tolerated)  Cut the lipitor in 1/2 daily   Labwork:  No new labs  Testing/Procedures:  No new testing   Follow-Up: It was a pleasure seeing you in the office today. Please call us if you have new issues that need to be addressed before your next appt.  [phone]  Your physician wants you to follow-up in: 6 months.  You will receive a reminder letter in the mail two months in advance. If you don't receive a letter, please call our office to schedule the follow-up appointment.  If you need a refill on your cardiac medications before your next appointment, please call your pharmacy.     Steps to Quit Smoking  Smoking tobacco can be harmful to your health and can affect almost every organ in your body. Smoking puts you, and those around you, at risk for developing many serious chronic diseases. Quitting smoking is difficult, but it is one of the best things that you can do for your health. It is never too late to quit. WHAT ARE THE BENEFITS OF QUITTING SMOKING? When you quit smoking, you lower your risk of developing serious diseases and conditions, such as:  Lung cancer or lung disease, such as COPD.  Heart disease.  Stroke.  Heart attack.  Infertility.  Osteoporosis and bone fractures. Additionally, symptoms such as coughing, wheezing, and shortness of breath may get better when you quit. You may also find that you get sick less often because your body is stronger at fighting off colds and infections. If you are pregnant, quitting smoking can help to reduce your chances of having a baby of low birth weight. HOW DO I GET READY TO QUIT? When you decide to quit smoking, create a plan to make sure that you are successful. Before you quit:  Pick a date to quit. Set a date within the next two weeks to give you time  to prepare.  Write down the reasons why you are quitting. Keep this list in places where you will see it often, such as on your bathroom mirror or in your car or wallet.  Identify the people, places, things, and activities that make you want to smoke (triggers) and avoid them. Make sure to take these actions:  Throw away all cigarettes at home, at work, and in your car.  Throw away smoking accessories, such as Scientist, research (medical).  Clean your car and make sure to empty the ashtray.  Clean your home, including curtains and carpets.  Tell your family, friends, and coworkers that you are quitting. Support from your loved ones can make quitting easier.  Talk with your health care provider about your options for quitting smoking.  Find out what treatment options are covered by your health insurance. WHAT STRATEGIES CAN I USE TO QUIT SMOKING?  Talk with your healthcare provider about different strategies to quit smoking. Some strategies include:  Quitting smoking altogether instead of gradually lessening how much you smoke over a period of time. Research shows that quitting "cold [place]" is more successful than gradually quitting.  Attending in-person counseling to help you build problem-solving skills. You are more likely to have success in quitting if you attend several counseling sessions. Even short sessions of 10 minutes can be effective.  Finding resources and support systems that can help you to quit  smoking and remain smoke-free after you quit. These resources are most helpful when you use them often. They can include:  Online chats with a Social worker.  Telephone quitlines.  Printed Furniture conservator/restorer.  Support groups or group counseling.  Text messaging programs.  Mobile phone applications.  Taking medicines to help you quit smoking. (If you are pregnant or breastfeeding, talk with your health care provider first.) Some medicines contain nicotine and some do not. Both  types of medicines help with cravings, but the medicines that include nicotine help to relieve withdrawal symptoms. Your health care provider may recommend:  Nicotine patches, gum, or lozenges.  Nicotine inhalers or sprays.  Non-nicotine medicine that is taken by mouth. Talk with your health care provider about combining strategies, such as taking medicines while you are also receiving in-person counseling. Using these two strategies together makes you more likely to succeed in quitting than if you used either strategy on its own. If you are pregnant or breastfeeding, talk with your health care provider about finding counseling or other support strategies to quit smoking. Do not take medicine to help you quit smoking unless told to do so by your health care provider. WHAT THINGS CAN I DO TO MAKE IT EASIER TO QUIT? Quitting smoking might feel overwhelming at first, but there is a lot that you can do to make it easier. Take these important actions:  Reach out to your family and friends and ask that they support and encourage you during this time. Call telephone quitlines, reach out to support groups, or work with a counselor for support.  Ask people who smoke to avoid smoking around you.  Avoid places that trigger you to smoke, such as bars, parties, or smoke-break areas at work.  Spend time around people who do not smoke.  Lessen stress in your life, because stress can be a smoking trigger for some people. To lessen stress, try:  Exercising regularly.  Deep-breathing exercises.  Yoga.  Meditating.  Performing a body scan. This involves closing your eyes, scanning your body from head to toe, and noticing which parts of your body are particularly tense. Purposefully relax the muscles in those areas.  Download or purchase mobile phone or tablet apps (applications) that can help you stick to your quit plan by providing reminders, tips, and encouragement. There are many free apps, such as  QuitGuide from the [employer] Office manager for Disease Control and Prevention). You can find other support for quitting smoking (smoking cessation) through smokefree.gov and other websites. HOW WILL I FEEL WHEN I QUIT SMOKING? Within the first 24 hours of quitting smoking, you may start to feel some withdrawal symptoms. These symptoms are usually most noticeable 2-3 days after quitting, but they usually do not last beyond 2-3 weeks. Changes or symptoms that you might experience include:  Mood swings.  Restlessness, anxiety, or irritation.  Difficulty concentrating.  Dizziness.  Strong cravings for sugary foods in addition to nicotine.  Mild weight gain.  Constipation.  Nausea.  Coughing or a sore throat.  Changes in how your medicines work in your body.  A depressed mood.  Difficulty sleeping (insomnia). After the first 2-3 weeks of quitting, you may start to notice more positive results, such as:  Improved sense of smell and taste.  Decreased coughing and sore throat.  Slower heart rate.  Lower blood pressure.  Clearer skin.  The ability to breathe more easily.  Fewer sick days. Quitting smoking is very challenging for most people. Do not get discouraged  if you are not successful the first time. Some people need to make many attempts to quit before they achieve long-term success. Do your best to stick to your quit plan, and talk with your health care provider if you have any questions or concerns.   This information is not intended to replace advice given to you by your health care provider. Make sure you discuss any questions you have with your health care provider.   Document Released: 05/19/2001 Document Revised: 10/09/2014 Document Reviewed: 10/09/2014 Elsevier Interactive Patient Education 2016 [employer]. Smoking Hazards Smoking cigarettes is extremely bad for your health. Tobacco smoke has over 200 known poisons in it. It contains the poisonous gases nitrogen oxide  and carbon monoxide. There are over 60 chemicals in tobacco smoke that cause cancer. Some of the chemicals found in cigarette smoke include:   Cyanide.   Benzene.   Formaldehyde.   Methanol (wood alcohol).   Acetylene (fuel used in welding torches).   Ammonia.  Even smoking lightly shortens your life expectancy by several years. You can greatly reduce the risk of medical problems for you and your family by stopping now. Smoking is the most preventable cause of death and disease in our society. Within days of quitting smoking, your circulation improves, you decrease the risk of having a heart attack, and your lung capacity improves. There may be some increased phlegm in the first few days after quitting, and it may take months for your lungs to clear up completely. Quitting for 10 years reduces your risk of developing lung cancer to almost that of a nonsmoker.  WHAT ARE THE RISKS OF SMOKING? Cigarette smokers have an increased risk of many serious medical problems, including:  Lung cancer.   Lung disease (such as pneumonia, bronchitis, and emphysema).   Heart attack and chest pain due to the heart not getting enough oxygen (angina).   Heart disease and peripheral blood vessel disease.   Hypertension.   Stroke.   Oral cancer (cancer of the lip, mouth, or voice box).   Bladder cancer.   Pancreatic cancer.   Cervical cancer.   Pregnancy complications, including premature birth.   Stillbirths and smaller newborn babies, birth defects, and genetic damage to sperm.   Early menopause.   Lower estrogen level for women.   Infertility.   Facial wrinkles.   Blindness.   Increased risk of broken bones (fractures).   Senile dementia.   Stomach ulcers and internal bleeding.   Delayed wound healing and increased risk of complications during surgery. Because of secondhand smoke exposure, children of smokers have an increased risk of the following:    Sudden infant death syndrome (SIDS).   Respiratory infections.   Lung cancer.   Heart disease.   Ear infections.  WHY IS SMOKING ADDICTIVE? Nicotine is the chemical agent in tobacco that is capable of causing addiction or dependence. When you smoke and inhale, nicotine is absorbed rapidly into the bloodstream through your lungs. Both inhaled and noninhaled nicotine may be addictive.  WHAT ARE THE BENEFITS OF QUITTING?  There are many health benefits to quitting smoking. Some are:   The likelihood of developing cancer and heart disease decreases. Health improvements are seen almost immediately.   Blood pressure, pulse rate, and breathing patterns start returning to normal soon after quitting.   People who quit may see an improvement in their overall quality of life.  HOW DO YOU QUIT SMOKING? Smoking is an addiction with both physical and psychological effects, and longtime  habits can be hard to change. Your health care provider can recommend:  Programs and community resources, which may include group support, education, or therapy.  Replacement products, such as patches, gum, and nasal sprays. Use these products only as directed. Do not replace cigarette smoking with electronic cigarettes (commonly called e-cigarettes). The safety of e-cigarettes is unknown, and some may contain harmful chemicals. FOR MORE INFORMATION  American Lung Association: www.lung.org  American Cancer Society: www.cancer.org   This information is not intended to replace advice given to you by your health care provider. Make sure you discuss any questions you have with your health care provider.   Document Released: 07/02/2004 Document Revised: 03/15/2013 Document Reviewed: 11/14/2012 Elsevier Interactive Patient Education 2016 [place] WHAT IS SECONDHAND SMOKE? Secondhand smoke is smoke that comes from burning tobacco. It could be the smoke from a cigarette, a pipe, or a  cigar. Even if you are not the one smoking, secondhand smoke exposes you to the dangers of smoking. This is called involuntary, or passive, smoking. There are two types of secondhand smoke:  Sidestream smoke is the smoke that comes off the lighted end of a cigarette, pipe, or cigar.  This type of smoke has the highest amount of cancer-causing agents (carcinogens).  The particles in sidestream smoke are smaller. They get into your lungs more easily.  Mainstream smoke is the smoke that is exhaled by a person who is smoking.  This type of smoke is also dangerous to your health. HOW CAN SECONDHAND SMOKE AFFECT MY HEALTH? Studies show that there is no safe level of secondhand smoke. This smoke contains thousands of chemicals. At least 82 of them are known to cause cancer. Secondhand smoke can also cause many other health problems. It has been linked to:  Lung cancer.  Cancer of the voice box (larynx) or throat.  Cancer of the sinuses.  Brain cancer.  Bladder cancer.  Stomach cancer.  Breast cancer.  White blood cell cancers (lymphoma and leukemia).  Brain and liver tumors in children.  Heart disease and stroke in adults.  Pregnancy loss (miscarriage).  Diseases in children, such as:  Asthma.  Lung infections.  Ear infections.  Sudden infant death syndrome (SIDS).  Slow growth. WHERE CAN I BE AT RISK FOR EXPOSURE TO SECONDHAND SMOKE?   For adults, the workplace is the main source of exposure to secondhand smoke.  Your workplace should have a policy separating smoking areas from nonsmoking areas.  Smoking areas should have a system for ventilating and cleaning the air.  For children, the home may be the most dangerous place for exposure to secondhand smoke.  Children who live in apartment buildings may be at risk from smoke drifting from hallways or other people's homes.  For everyone, many public places are possible sources of exposure to secondhand  smoke.  These places include restaurants, shopping centers, and parks. HOW CAN I REDUCE MY RISK FOR EXPOSURE TO SECONDHAND SMOKE? The most important thing you can do is not smoke. Discourage family members from smoking. Other ways to reduce exposure for you and your family include the following:  Keep your home smoke free.  Make sure your child care providers do not smoke.  Warn your child about the dangers of smoking and secondhand smoke.  Do not allow smoking in your car. When someone smokes in a car, all the damaging chemicals from the smoke are confined in a small area.  Avoid public places where smoking is allowed.  This information is not intended to replace advice given to you by your health care provider. Make sure you discuss any questions you have with your health care provider.   Document Released: 07/02/2004 Document Revised: 06/15/2014 Document Reviewed: 09/08/2013 Elsevier Interactive Patient Education [employer].

## 2016-01-07 NOTE — Progress Notes (Signed)
Cardiology Office Note  Date:  01/07/2016   ID:  Sandra Ellison, DOB 29-Nov-1945, MRN [id number]  PCP:  Einar Pheasant, MD   Chief Complaint  Patient presents with  . Other    3 month follow up. Pt. was at [facility] for a TIA in June 2017. Meds reviewed by the patient verbally.     HPI:  70 year old woman with a history of Pulmonary hypertension, systolic and diastolic CHF with ejection fraction 25%, COPD who continues to smoke, catheterization February 2017 with moderate left main disease, decreased pulse in her left arm, who presents for follow-up of her cardiomyopathy She does have a history of peptic ulcer disease, anemia, chronic renal insufficiency  Recent hospitalization for stroke in June 2017 Records reviewed with her in detail Left side deficits, continues to walk with a walker for gait instability Acute stroke seen on MRI left cerebellum Carotid ultrasound showing mild to moderate carotid disease bilaterally Echocardiogram during the hospital course showing ejection fraction less than 25% She was discharged on Plavix  In follow-up today, she reports that she feels well, and eyes any significant shortness of breath on exertion, no leg edema, abdominal bloating, weight gain She does not drink very much during the daytime, has not been taking Lasix on a regular basis She has been compliant with her Lipitor 40 mg daily Happy about her recovery though still would like to walk without a walker  On discussion of her CHF management, she was unable to tolerate entresto and ARBs in the past secondary to hyperkalemia. Aldactone not started also out of fear of hyperkalemia  EKG on today's visit shows normal sinus rhythm with rate 64 bpm, T-wave abnormality V3 through V6, 2, aVF, 1 and aVL   Other past medical history peptic ulcer disease,  cardiomyopathy with history of systolic and diastolic CHF, COPD with long smoking history who continues to smoke, pulmonary hypertension seen on  echocardiogram in March 2012 who presented to [facility] with increasing shortness of breath on February 11, 2011, chronic renal insufficiency, stage II, severe iron deficiency anemia, history of systolic heart failure, cardiac catheterization February 2017 for worsening ejection fraction found to have   moderate left main disease, who presents for follow-up of her systolic CHF. Decreased pulse in the left arm  She has neuropathy in her legs. Other workup including CT scan of the chest 06/19/2015 showing coronary calcifications, aortic atherosclerosis. Images reviewed with her in detail  Ultrasound of the abdomen showing kidney stones, sludge in the gallbladder, atrophic left kidney. Results discussed with her She continues to smoke  Previously taken off entresto and ARB's for high potassium We did try bidil that she developed headaches Reports she is tolerating hydralazine Was previously on lisinopril 10 mg daily several years ago and reports having no side effect  cardiac catheterization details :  LM lesion, 65% stenosed, Ost LAD lesion, 50% stenosed, Dist Cx to LPDA lesion, 80% stenosed, 2nd Mrg lesion, 40% stenosed.  Seen in the emergency room twice, once in 05/29/2015 for shortness of breath with cough felt to be from COPD exacerbation, given a Z-Pak and steroids Second trip to the ER 07/18/2015 for nausea vomiting felt to be a GI bug  Recent CT scan of the chest reviewed with her in detail showing severe three-vessel coronary artery disease, moderate plaquing in her aorta.  Last echocardiogram December 2015 showing ejection fraction 20-25% which was down from 30-35% in August 2013  Echo done in the hospital 05/17/2014 was reviewed with her  showing ejection fraction 20-25%, mildly elevated right trigger systolic pressures  Previous hospital admission in 2012 she was found to have CHF, moderate bilateral pleural effusions with BNP of 26,000. Underlying anemia was a significant  component of her presentation. Previously received procrit every other week, bone marrow biopsy, received packed red blood cells x2, iron transfusion x2,  followed by Dr. Ma Hillock.  Echocardiogram in the hospital showed ejection fraction 30-35%, mild to moderate right ventricular systolic pressure estimated at 40-50 mmHg  Echocardiogram performed February 11, 2011 shows ejection fraction less than 25% with severe global hypokinesis, apical akinesis, normal right ventricular systolic function , mild to moderate mitral valve regurgitation, normal right ventricular systolic pressures  Stress test done in the hospital on February 13 2011 shows no significant ischemia. She has   PMH:   has a past medical history of Allergy; Arthritis; Carotid arterial disease ([place]); Chronic combined systolic and diastolic CHF (congestive heart failure) ([place]); Chronic kidney disease (CKD), stage II (mild); COPD (chronic obstructive pulmonary disease) ([place]); Dilated cardiomyopathy ([place]); GERD (gastroesophageal reflux disease); History of abnormal Pap smear; History of diverticulosis; colonic polyps; Hyperkalemia; Hypertension; Iron deficiency anemia; Pulmonary HTN ([place]); TIA (transient ischemic attack); and Tobacco abuse.  PSH:    Past Surgical History:  Procedure Laterality Date  . CARDIAC CATHETERIZATION Left 08/05/2015   Procedure: Left Heart Cath and Coronary Angiography;  Surgeon: Minna Merritts, MD;  Location: [place] [facility];  Service: Cardiovascular;  Laterality: Left;  . COLONOSCOPY  10/2010  . ORIF DISTAL RADIUS FRACTURE  2011   right  . TONSILLECTOMY  age 52  . VAGINAL HYSTERECTOMY  1981   secondary to abnormal pap smear    Current Outpatient Prescriptions  Medication Sig Dispense Refill  . albuterol (PROVENTIL) (2.5 MG/3ML) 0.083% nebulizer solution Take 3 mLs by nebulization every 4 (four) hours as needed. For wheezing/shortness of breath.    Marland Kitchen atorvastatin (LIPITOR) 40 MG tablet Take 1 tablet  (40 mg total) by mouth daily. 30 tablet 11  . carvedilol (COREG) 6.25 MG tablet Take 6.25 mg by mouth 2 (two) times daily with a meal.  3  . citalopram (CELEXA) 10 MG tablet TAKE 1 TABLET (10 MG TOTAL) BY MOUTH DAILY. 30 tablet 3  . clopidogrel (PLAVIX) 75 MG tablet TAKE 1 TABLET (75 MG TOTAL) BY MOUTH DAILY. 30 tablet 2  . Fe Fum-FePoly-Vit C-Vit B3 (INTEGRA) 62.5-62.5-40-3 MG CAPS One per day (Patient taking differently: Take 1 capsule by mouth daily. ) 30 capsule 2  . furosemide (LASIX) 20 MG tablet TAKE 1 TABLET (20 MG TOTAL) BY MOUTH DAILY AS NEEDED. 30 tablet 6  . gabapentin (NEURONTIN) 100 MG capsule TAKE 1 CAPSULE (100 MG TOTAL) BY MOUTH 3 (THREE) TIMES DAILY AS NEEDED. 90 capsule 1  . lisinopril (PRINIVIL,ZESTRIL) 10 MG tablet Take 10 mg by mouth daily.  3  . meclizine (ANTIVERT) 12.5 MG tablet Take 1 tablet (12.5 mg total) by mouth 3 (three) times daily as needed for dizziness. 30 tablet 0  . oxyCODONE (OXY IR/ROXICODONE) 5 MG immediate release tablet Take 1 tablet (5 mg total) by mouth every 4 (four) hours as needed for moderate pain. 30 tablet 0  . pantoprazole (PROTONIX) 40 MG tablet TAKE 1 TABLET (40 MG TOTAL) BY MOUTH DAILY. 30 tablet 11  . tiotropium (SPIRIVA HANDIHALER) 18 MCG inhalation capsule One inhalation daily (Patient taking differently: Place 18 mcg into inhaler and inhale daily. ) 30 capsule 5  . VENTOLIN HFA 108 (90 Base) MCG/ACT inhaler  INHALE 2 PUFFS EVERY 6 HOURS AS NEEDED FOR WHEEZING OR SHORTNESS OF BREATH 18 Inhaler 3   No current facility-administered medications for this visit.      Allergies:   Ranitidine; Ranitidine hcl; and Entresto [sacubitril-valsartan]   Social History:  The patient  reports that she has been smoking Cigarettes.  She has a 21.00 pack-year smoking history. She has never used smokeless tobacco. She reports that she does not drink alcohol or use drugs.   Family History:   family history includes Alcohol abuse in her father; Arthritis in her  father and mother; Cancer in her father.    Review of Systems: Review of Systems  Constitutional: Negative.        Weight loss  Respiratory: Negative.   Cardiovascular: Negative.   Gastrointestinal: Negative.   Musculoskeletal: Negative.        Gait instability  Neurological: Negative.   Psychiatric/Behavioral: Negative.   All other systems reviewed and are negative.    PHYSICAL EXAM: VS:  BP 110/62 (BP Location: Right Leg, Patient Position: Sitting, Cuff Size: Normal)   Pulse 64   Ht _0  (1.651 m)   Wt 121 lb 4 oz (55 kg)   BMI 20.18 kg/m  , BMI Body mass index is 20.18 kg/m. GEN: Well nourished, well developed, in no acute distress  HEENT: normal  Neck: no JVD, carotid bruits, or masses Cardiac: RRR; no murmurs, rubs, or gallops,no edema  Respiratory:  clear to auscultation bilaterally, normal work of breathing GI: soft, nontender, nondistended, + BS MS: no deformity or atrophy  Skin: warm and dry, no rash Neuro:  Strength and sensation are intact Psych: euthymic mood, full affect    Recent Labs: 03/06/2015: Magnesium 1.9 08/22/2015: B Natriuretic Peptide 2,890.0 08/29/2015: TSH 2.67 11/24/2015: ALT 19; BUN 17; Creatinine, Ser 1.03; Hemoglobin 13.7; Platelets 234; Potassium 3.8; Sodium 138    Lipid Panel Lab Results  Component Value Date   CHOL 75 11/25/2015   HDL 25 (L) 11/25/2015   LDLCALC 30 11/25/2015   TRIG 99 11/25/2015      Wt Readings from Last 3 Encounters:  01/07/16 121 lb 4 oz (55 kg)  11/24/15 135 lb 14.4 oz (61.6 kg)  09/27/15 147 lb 8 oz (66.9 kg)       ASSESSMENT AND PLAN:  Coronary artery disease involving native coronary artery of native heart with angina pectoris with documented spasm ([place]) - Plan: EKG 12-Lead Currently with no symptoms of angina. No further workup at this time. Continue current medication regimen. Long discussion concerning her need to stop smoking  Essential hypertension - Plan: EKG 12-Lead We'll try to  increase lisinopril as tolerated  Pulmonary hypertension (HCC) She denies shortness of breath the previous echocardiogram confirming right heart pressures No change to her medications at this time Nonischemic cardiomyopathy [facility] Etiology unclear, will continue medical management Previous cardiac catheterization  Chronic combined systolic and diastolic CHF (congestive heart failure) (HCC) Interestingly has been doing remarkably well in terms of her CHF management Recommended she increase lisinopril up to 15 mg daily, slowly up to 20 mg daily Previously unable to tolerate ARB's or entresto, secondary to hyperkalemia We'll avoid Aldactone out of fear of hyperkalemia  Bilateral carotid artery disease (HCC) Less than 50% bilateral carotid disease on recent ultrasound Cholesterol at goal, stressed importance of smoking cessation  Cerebral infarction due to unspecified mechanism Long discussion concerning recent stroke Suggested she stay on aspirin, Plavix. She does have carotid disease, continues to smoke  Tobacco  abuse We have encouraged her to continue to work on weaning her cigarettes and smoking cessation. She will continue to work on this and does not want any assistance with chantix.    Family presents with her today, all questions answered  Total encounter time more than 25 minutes  Greater than 50% was spent in counseling and coordination of care with the patient   Disposition:   F/U  6 months   Orders Placed This Encounter  Procedures  . EKG 12-Lead     Signed, Esmond Plants, M.D., Ph.D. 01/07/2016  [place], [place]

## 2016-01-14 ENCOUNTER — Ambulatory Visit (INDEPENDENT_AMBULATORY_CARE_PROVIDER_SITE_OTHER): Payer: Commercial Managed Care - HMO | Admitting: Internal Medicine

## 2016-01-14 ENCOUNTER — Encounter: Payer: Self-pay | Admitting: Internal Medicine

## 2016-01-14 VITALS — BP 128/72 | HR 60 | Temp 98.4°F | Resp 18 | Wt 125.0 lb

## 2016-01-14 DIAGNOSIS — K219 Gastro-esophageal reflux disease without esophagitis: Secondary | ICD-10-CM

## 2016-01-14 DIAGNOSIS — E875 Hyperkalemia: Secondary | ICD-10-CM | POA: Diagnosis not present

## 2016-01-14 DIAGNOSIS — I639 Cerebral infarction, unspecified: Secondary | ICD-10-CM

## 2016-01-14 DIAGNOSIS — F419 Anxiety disorder, unspecified: Secondary | ICD-10-CM

## 2016-01-14 DIAGNOSIS — N184 Chronic kidney disease, stage 4 (severe): Secondary | ICD-10-CM

## 2016-01-14 DIAGNOSIS — I10 Essential (primary) hypertension: Secondary | ICD-10-CM

## 2016-01-14 DIAGNOSIS — I429 Cardiomyopathy, unspecified: Secondary | ICD-10-CM

## 2016-01-14 DIAGNOSIS — I428 Other cardiomyopathies: Secondary | ICD-10-CM

## 2016-01-14 DIAGNOSIS — Z720 Tobacco use: Secondary | ICD-10-CM

## 2016-01-14 DIAGNOSIS — J449 Chronic obstructive pulmonary disease, unspecified: Secondary | ICD-10-CM

## 2016-01-14 LAB — BASIC METABOLIC PANEL
BUN: 27 mg/dL — AB (ref 7–25)
CHLORIDE: 105 mmol/L (ref 98–110)
CO2: 20 mmol/L (ref 20–31)
CREATININE: 2 mg/dL — AB (ref 0.50–0.99)
Calcium: 8.6 mg/dL (ref 8.6–10.4)
Glucose, Bld: 87 mg/dL (ref 65–99)
POTASSIUM: 4.9 mmol/L (ref 3.5–5.3)
Sodium: 137 mmol/L (ref 135–146)

## 2016-01-14 NOTE — Progress Notes (Signed)
Patient ID: Sandra Ellison, female   DOB: February 28, 1946, 70 y.o.   MRN: [id number]   Subjective:    Patient ID: Sandra Ellison, female    DOB: Mar 17, 1946, 70 y.o.   MRN: [id number]  HPI  Patient here for follow up.  Was admitted 11/24/15 with CVA - cerebellum.  MRI and carotid ultrasound as outlined.  Evaluated by neurology.  Placed on plavix.  Went to rehab after hospitalization.  Home now.  Walking with walker.  Discussed continued physical therapy.  She reports needing to work out transportation.  States therapy evaluated and that she would not benefit from therapy at home.  She is eating.  Appetite is better.  Breathing stable.  Saw Dr Rockey Situ last week.  See note.  He added back aspirin and recommended continuing plavix.  He also recommended increasing lisinopril to 10mg  1 1/2 table q day.  She had not been taking lisinopril after discharge from rehab.  She restarted 10mg  last week.  Discussed the need to quit smoking.     Past Medical History:  Diagnosis Date  . Allergy   . Arthritis   . Carotid arterial disease ([place])    a. 02/2015 Carotid U/S: RICA [id number], LICA [id number].  . Chronic combined systolic and diastolic CHF (congestive heart failure) ([place])    a. 01/2012 Echo: EF 30-35%, mod conc LVH, sev inf HK, mildly dil LA, mild-mod MR, Trace TR, RVSP 40-80mmHg.    Marland Kitchen Chronic kidney disease (CKD), stage II (mild)   . COPD (chronic obstructive pulmonary disease) ([place])   . Dilated cardiomyopathy ([place])    a. 02/2011 MV: EF 27%, anteroseptal, inf, inferolateral scar, no ischemia.  Marland Kitchen GERD (gastroesophageal reflux disease)   . History of abnormal Pap smear    a. s/p vaginal hysterectomy  . History of diverticulosis   . Hx of colonic polyps   . Hyperkalemia    a. 02/2015 in setting of entresto/AKI->entresto d/c'd.  . Hypertension   . Iron deficiency anemia    a. h/o transfusions, prev followed by heme/onc.  . Pulmonary HTN ([place])   . TIA (transient ischemic attack)   . Tobacco abuse    Past  Surgical History:  Procedure Laterality Date  . CARDIAC CATHETERIZATION Left 08/05/2015   Procedure: Left Heart Cath and Coronary Angiography;  Surgeon: Minna Merritts, MD;  Location: [place] [facility];  Service: Cardiovascular;  Laterality: Left;  . COLONOSCOPY  10/2010  . ORIF DISTAL RADIUS FRACTURE  2011   right  . TONSILLECTOMY  age 61  . VAGINAL HYSTERECTOMY  1981   secondary to abnormal pap smear   Family History  Problem Relation Age of Onset  . Arthritis Mother   . Alcohol abuse Father   . Arthritis Father   . Cancer Father     throat/spine  . Breast cancer Neg Hx    Social History   Social History  . Marital status: Divorced    Spouse name: N/A  . Number of children: 1  . Years of education: N/A   Social History Main Topics  . Smoking status: Current Every Day Smoker    Packs/day: 0.50    Years: 42.00    Types: Cigarettes  . Smokeless tobacco: Never Used     Comment: Down to 3 cigarettes per day  . Alcohol use No  . Drug use: No  . Sexual activity: Not Asked   Other Topics Concern  . None   Social History Narrative  . None  Outpatient Encounter Prescriptions as of 01/14/2016  Medication Sig  . albuterol (PROVENTIL) (2.5 MG/3ML) 0.083% nebulizer solution Take 3 mLs by nebulization every 4 (four) hours as needed. For wheezing/shortness of breath.  Marland Kitchen atorvastatin (LIPITOR) 40 MG tablet Take 1 tablet (40 mg total) by mouth daily.  . carvedilol (COREG) 6.25 MG tablet Take 6.25 mg by mouth 2 (two) times daily with a meal.  . citalopram (CELEXA) 10 MG tablet TAKE 1 TABLET (10 MG TOTAL) BY MOUTH DAILY.  Marland Kitchen clopidogrel (PLAVIX) 75 MG tablet TAKE 1 TABLET (75 MG TOTAL) BY MOUTH DAILY.  Marland Kitchen Fe Fum-FePoly-Vit C-Vit B3 (INTEGRA) 62.5-62.5-40-3 MG CAPS One per day (Patient taking differently: Take 1 capsule by mouth daily. )  . furosemide (LASIX) 20 MG tablet TAKE 1 TABLET (20 MG TOTAL) BY MOUTH DAILY AS NEEDED.  Marland Kitchen gabapentin (NEURONTIN) 100 MG capsule TAKE 1 CAPSULE  (100 MG TOTAL) BY MOUTH 3 (THREE) TIMES DAILY AS NEEDED.  Marland Kitchen lisinopril (PRINIVIL,ZESTRIL) 10 MG tablet Take 10 mg by mouth daily.  . meclizine (ANTIVERT) 12.5 MG tablet Take 1 tablet (12.5 mg total) by mouth 3 (three) times daily as needed for dizziness.  Marland Kitchen oxyCODONE (OXY IR/ROXICODONE) 5 MG immediate release tablet Take 1 tablet (5 mg total) by mouth every 4 (four) hours as needed for moderate pain.  . pantoprazole (PROTONIX) 40 MG tablet TAKE 1 TABLET (40 MG TOTAL) BY MOUTH DAILY.  Marland Kitchen tiotropium (SPIRIVA HANDIHALER) 18 MCG inhalation capsule One inhalation daily (Patient taking differently: Place 18 mcg into inhaler and inhale daily. )  . VENTOLIN HFA 108 (90 Base) MCG/ACT inhaler INHALE 2 PUFFS EVERY 6 HOURS AS NEEDED FOR WHEEZING OR SHORTNESS OF BREATH   No facility-administered encounter medications on file as of 01/14/2016.     Review of Systems  Constitutional:       With weight loss.  She is eating better now.  Weight starting to come up.  Appetite better.   HENT: Negative for congestion and sinus pressure.   Respiratory: Negative for cough, chest tightness and shortness of breath.   Cardiovascular: Negative for chest pain, palpitations and leg swelling.  Gastrointestinal: Negative for abdominal pain, diarrhea, nausea and vomiting.  Musculoskeletal: Negative for back pain and joint swelling.  Skin: Negative for color change and rash.  Neurological:       Still a little dizzy.  Is better.  Walking with walker.    Psychiatric/Behavioral: Negative for agitation and dysphoric mood.       Objective:    Physical Exam  Constitutional: She appears well-developed and well-nourished. No distress.  HENT:  Nose: Nose normal.  Mouth/Throat: Oropharynx is clear and moist.  Neck: Neck supple. No thyromegaly present.  Cardiovascular: Normal rate and regular rhythm.   Pulmonary/Chest: Breath sounds normal. No respiratory distress. She has no wheezes.  Abdominal: Soft. Bowel sounds are normal.  There is no tenderness.  Musculoskeletal: She exhibits no edema or tenderness.  Lymphadenopathy:    She has no cervical adenopathy.  Skin: No rash noted. No erythema.  Psychiatric: Her behavior is normal.    BP 128/72   Pulse 60   Temp 98.4 F (36.9 C)   Resp 18   Wt 125 lb (56.7 kg)   SpO2 99%   BMI 20.80 kg/m  Wt Readings from Last 3 Encounters:  01/14/16 125 lb (56.7 kg)  01/07/16 121 lb 4 oz (55 kg)  11/24/15 135 lb 14.4 oz (61.6 kg)     Lab Results  Component Value Date  WBC 7.1 11/24/2015   HGB 13.7 11/24/2015   HCT 41.9 11/24/2015   PLT 234 11/24/2015   GLUCOSE 87 01/14/2016   CHOL 75 11/25/2015   TRIG 99 11/25/2015   HDL 25 (L) 11/25/2015   LDLCALC 30 11/25/2015   ALT 19 11/24/2015   AST 36 11/24/2015   NA 137 01/14/2016   K 4.9 01/14/2016   CL 105 01/14/2016   CREATININE 2.00 (H) 01/14/2016   BUN 27 (H) 01/14/2016   CO2 20 01/14/2016   TSH 2.67 08/29/2015   INR 1.10 11/24/2015   HGBA1C 5.6 11/25/2015    Mr Brain Wo Contrast  Result Date: 11/25/2015 CLINICAL DATA:  TIA.  Acute onset dizziness and nausea. EXAM: MRI HEAD WITHOUT CONTRAST TECHNIQUE: Multiplanar, multiecho pulse sequences of the brain and surrounding structures were obtained without intravenous contrast. COMPARISON:  CT head 11/24/2015 FINDINGS: Acute infarct left superior cerebellum extending into the superior cerebellar peduncle and lateral to the fourth ventricle on the left. No other areas of acute infarct. Negative for hemorrhage. Patchy hyperintensity in the cerebral right white matter consistent with mild chronic microvascular ischemia. Brainstem intact. Negative for mass or edema Ventricle size normal.  Cerebral volume normal for age. Mild mucosal edema paranasal sinuses. Pituitary and skull base normal. IMPRESSION: Acute infarct in the left superior cerebellum medially. No associated hemorrhage Mild chronic microvascular ischemia. Electronically Signed   By: Franchot Gallo M.D.   On:  11/25/2015 14:17   US Carotid Bilateral  Result Date: 11/25/2015 CLINICAL DATA:  TIA. EXAM: BILATERAL CAROTID DUPLEX ULTRASOUND TECHNIQUE: Pearline Cables scale imaging, color Doppler and duplex ultrasound were performed of bilateral carotid and vertebral arteries in the neck. COMPARISON:  CT 11/24/2015 . FINDINGS: Criteria: Quantification of carotid stenosis is based on velocity parameters that correlate the residual internal carotid diameter with NASCET-based stenosis levels, using the diameter of the distal internal carotid lumen as the denominator for stenosis measurement. The following velocity measurements were obtained: RIGHT ICA:  63/9 cm/sec CCA:  99991111 cm/sec SYSTOLIC ICA/CCA RATIO:  0.6 DIASTOLIC ICA/CCA RATIO:  0.7 ECA:  80 cm/sec LEFT ICA:  73/17 cm/sec CCA:  [id number] cm/sec SYSTOLIC ICA/CCA RATIO:  0.6 DIASTOLIC ICA/CCA RATIO:  0.9 ECA:  0.9 cm/sec RIGHT CAROTID ARTERY: Mild to moderate right distal common carotid and carotid bifurcation atherosclerotic vascular disease. No flow limiting stenosis. RIGHT VERTEBRAL ARTERY:  Patent with antegrade flow. LEFT CAROTID ARTERY: Mild moderate left distal common carotid carotid bifurcation atherosclerotic vascular disease. No flow limiting stenosis. LEFT VERTEBRAL ARTERY: Questionable retrograde flow left vertebral artery. IMPRESSION: 1. Mild moderate bilateral distal common carotid and carotid bifurcation atherosclerotic vascular disease. No flow limiting stenosis . Degree of stenosis less than 50% bilaterally. 2. Questionable retrograde flow left vertebral artery, subclavian steal cannot be excluded. Electronically Signed   By: Marcello Moores  Register   On: 11/25/2015 11:40       Assessment & Plan:   Problem List Items Addressed This Visit    Anxiety    Doing well on citalopram.       CKD (chronic kidney disease), stage IV ([place]) - Primary    Recently started on lisinopril.  Recheck metabolic panel.       Relevant Orders   Basic metabolic panel (Completed)    COPD (chronic obstructive pulmonary disease) (HCC)    Breathing stable.  Continue inhalers.       CVA (cerebral infarction)    Recent admission for CVA.  See note.  Discharge summary reviewed.  On plavix and aspirin  now.  Walking with walker.  Encouraged continued physical therapy.  Follow.        Essential hypertension    Blood pressure doing well on current regimen.  Follow.        GERD (gastroesophageal reflux disease)    On protonix.  No upper symptoms reported.       Nonischemic cardiomyopathy ([place])    Followed by cardiology.  Recent echo - 25% EF.  Recently started back on lisinopril.  Recheck metabolic panel.       Tobacco abuse    Discussed the need to quit smoking.  She continues to smoke.  Declines to stop at this time.         Other Visit Diagnoses    Hyperkalemia       Relevant Orders   Basic metabolic panel (Completed)       Einar Pheasant, MD

## 2016-01-14 NOTE — Progress Notes (Signed)
Pre visit review using our clinic review tool, if applicable. No additional management support is needed unless otherwise documented below in the visit note. 

## 2016-01-15 ENCOUNTER — Encounter: Payer: Self-pay | Admitting: Internal Medicine

## 2016-01-15 ENCOUNTER — Other Ambulatory Visit: Payer: Self-pay | Admitting: Internal Medicine

## 2016-01-15 DIAGNOSIS — R7989 Other specified abnormal findings of blood chemistry: Secondary | ICD-10-CM

## 2016-01-15 NOTE — Assessment & Plan Note (Signed)
Recently started on lisinopril.  Recheck metabolic panel.

## 2016-01-15 NOTE — Assessment & Plan Note (Signed)
Doing well on citalopram.

## 2016-01-15 NOTE — Assessment & Plan Note (Signed)
Followed by cardiology.  Recent echo - 25% EF.  Recently started back on lisinopril.  Recheck metabolic panel.

## 2016-01-15 NOTE — Assessment & Plan Note (Signed)
Blood pressure doing well on current regimen.  Follow.   

## 2016-01-15 NOTE — Assessment & Plan Note (Signed)
Recent admission for CVA.  See note.  Discharge summary reviewed.  On plavix and aspirin now.  Walking with walker.  Encouraged continued physical therapy.  Follow.

## 2016-01-15 NOTE — Assessment & Plan Note (Signed)
Discussed the need to quit smoking.  She continues to smoke.  Declines to stop at this time.

## 2016-01-15 NOTE — Assessment & Plan Note (Signed)
On protonix.  No upper symptoms reported.   

## 2016-01-15 NOTE — Assessment & Plan Note (Signed)
Breathing stable.  Continue inhalers.   

## 2016-01-17 ENCOUNTER — Other Ambulatory Visit (INDEPENDENT_AMBULATORY_CARE_PROVIDER_SITE_OTHER): Payer: Commercial Managed Care - HMO

## 2016-01-17 DIAGNOSIS — R748 Abnormal levels of other serum enzymes: Secondary | ICD-10-CM | POA: Diagnosis not present

## 2016-01-17 DIAGNOSIS — R7989 Other specified abnormal findings of blood chemistry: Secondary | ICD-10-CM

## 2016-01-17 LAB — BASIC METABOLIC PANEL
BUN: 25 mg/dL — AB (ref 6–23)
CHLORIDE: 103 meq/L (ref 96–112)
CO2: 28 meq/L (ref 19–32)
CREATININE: 1.59 mg/dL — AB (ref 0.40–1.20)
Calcium: 9.8 mg/dL (ref 8.4–10.5)
GFR: 34.15 mL/min — ABNORMAL LOW (ref >60.00)
GLUCOSE: 92 mg/dL (ref 70–99)
Potassium: 5.1 mEq/L (ref 3.5–5.1)
Sodium: 137 mEq/L (ref 135–145)

## 2016-01-19 ENCOUNTER — Other Ambulatory Visit: Payer: Self-pay | Admitting: Internal Medicine

## 2016-01-19 DIAGNOSIS — R7989 Other specified abnormal findings of blood chemistry: Secondary | ICD-10-CM

## 2016-01-20 ENCOUNTER — Ambulatory Visit: Payer: Commercial Managed Care - HMO | Attending: General Surgery

## 2016-01-20 ENCOUNTER — Ambulatory Visit: Admission: RE | Admit: 2016-01-20 | Payer: Self-pay | Source: Ambulatory Visit

## 2016-01-20 ENCOUNTER — Ambulatory Visit: Payer: Self-pay

## 2016-01-20 ENCOUNTER — Other Ambulatory Visit: Payer: Commercial Managed Care - HMO

## 2016-01-20 LAB — URINALYSIS, ROUTINE W REFLEX MICROSCOPIC
BILIRUBIN URINE: NEGATIVE
Hgb urine dipstick: NEGATIVE
Ketones, ur: NEGATIVE
Nitrite: NEGATIVE
PH: 7 (ref 5.0–8.0)
RBC / HPF: NONE SEEN (ref 0–?)
SPECIFIC GRAVITY, URINE: 1.01 (ref 1.000–1.030)
TOTAL PROTEIN, URINE-UPE24: NEGATIVE
Urine Glucose: NEGATIVE
Urobilinogen, UA: 0.2 (ref 0.0–1.0)

## 2016-01-29 ENCOUNTER — Other Ambulatory Visit (INDEPENDENT_AMBULATORY_CARE_PROVIDER_SITE_OTHER): Payer: Commercial Managed Care - HMO

## 2016-01-29 ENCOUNTER — Encounter (INDEPENDENT_AMBULATORY_CARE_PROVIDER_SITE_OTHER): Payer: Self-pay

## 2016-01-29 ENCOUNTER — Ambulatory Visit: Payer: Self-pay | Admitting: General Surgery

## 2016-01-29 DIAGNOSIS — R748 Abnormal levels of other serum enzymes: Secondary | ICD-10-CM | POA: Diagnosis not present

## 2016-01-29 DIAGNOSIS — R7989 Other specified abnormal findings of blood chemistry: Secondary | ICD-10-CM

## 2016-01-29 LAB — BASIC METABOLIC PANEL
BUN: 27 mg/dL — ABNORMAL HIGH (ref 6–23)
CHLORIDE: 105 meq/L (ref 96–112)
CO2: 26 meq/L (ref 19–32)
CREATININE: 1.39 mg/dL — AB (ref 0.40–1.20)
Calcium: 8.9 mg/dL (ref 8.4–10.5)
GFR: 39.87 mL/min — ABNORMAL LOW (ref >60.00)
Glucose, Bld: 86 mg/dL (ref 70–99)
Potassium: 5.3 mEq/L — ABNORMAL HIGH (ref 3.5–5.1)
Sodium: 137 mEq/L (ref 135–145)

## 2016-01-30 ENCOUNTER — Telehealth: Payer: Self-pay | Admitting: *Deleted

## 2016-01-30 DIAGNOSIS — E875 Hyperkalemia: Secondary | ICD-10-CM

## 2016-01-30 NOTE — Telephone Encounter (Signed)
Lab orders placed for [facility]

## 2016-02-05 ENCOUNTER — Other Ambulatory Visit
Admission: RE | Admit: 2016-02-05 | Discharge: 2016-02-05 | Disposition: A | Discharge status: Home / Self Care | Payer: Commercial Managed Care - HMO | Source: Ambulatory Visit | Attending: Internal Medicine | Admitting: Internal Medicine

## 2016-02-05 DIAGNOSIS — E875 Hyperkalemia: Secondary | ICD-10-CM | POA: Insufficient documentation

## 2016-02-05 LAB — POTASSIUM: POTASSIUM: 4.7 mmol/L (ref 3.5–5.1)

## 2016-02-06 ENCOUNTER — Telehealth: Payer: Self-pay

## 2016-02-06 NOTE — Telephone Encounter (Signed)
Advised pt of lab results. Pt verbally acknowledges understanding. Pt reports she has not changed medications, but did stop drinking Ensure, and pt believes that may have helped potassium come back WNL. Renaldo Fiddler, CMA

## 2016-02-06 NOTE — Telephone Encounter (Signed)
-----   Message from Bevelyn Ngo, RN sent at 02/06/2016  9:50 AM EDT -----   ----- Message ----- From: Einar Pheasant, MD Sent: 02/06/2016   4:38 AM To: Leeanne Rio, CMA  Please call and notify pt that her potassium is wnl.  (this was run stat at hospital).

## 2016-02-07 ENCOUNTER — Telehealth: Payer: Self-pay | Admitting: *Deleted

## 2016-02-07 NOTE — Telephone Encounter (Signed)
Patient requested lab result  Pt contact [phone]

## 2016-02-11 NOTE — Telephone Encounter (Signed)
Patient has already been informed.

## 2016-02-18 ENCOUNTER — Other Ambulatory Visit: Payer: Self-pay | Admitting: Internal Medicine

## 2016-02-20 ENCOUNTER — Other Ambulatory Visit: Payer: Self-pay | Admitting: Internal Medicine

## 2016-02-22 ENCOUNTER — Other Ambulatory Visit: Payer: Self-pay | Admitting: Internal Medicine

## 2016-02-25 ENCOUNTER — Ambulatory Visit (INDEPENDENT_AMBULATORY_CARE_PROVIDER_SITE_OTHER): Payer: Commercial Managed Care - HMO | Admitting: Internal Medicine

## 2016-02-25 ENCOUNTER — Encounter: Payer: Self-pay | Admitting: Internal Medicine

## 2016-02-25 VITALS — BP 124/80 | HR 76 | Temp 97.5°F | Ht 65.0 in | Wt 133.2 lb

## 2016-02-25 DIAGNOSIS — Z720 Tobacco use: Secondary | ICD-10-CM

## 2016-02-25 DIAGNOSIS — I429 Cardiomyopathy, unspecified: Secondary | ICD-10-CM

## 2016-02-25 DIAGNOSIS — R739 Hyperglycemia, unspecified: Secondary | ICD-10-CM

## 2016-02-25 DIAGNOSIS — I779 Disorder of arteries and arterioles, unspecified: Secondary | ICD-10-CM

## 2016-02-25 DIAGNOSIS — I10 Essential (primary) hypertension: Secondary | ICD-10-CM

## 2016-02-25 DIAGNOSIS — I272 Other secondary pulmonary hypertension: Secondary | ICD-10-CM

## 2016-02-25 DIAGNOSIS — J449 Chronic obstructive pulmonary disease, unspecified: Secondary | ICD-10-CM

## 2016-02-25 DIAGNOSIS — I25111 Atherosclerotic heart disease of native coronary artery with angina pectoris with documented spasm: Secondary | ICD-10-CM

## 2016-02-25 DIAGNOSIS — R928 Other abnormal and inconclusive findings on diagnostic imaging of breast: Secondary | ICD-10-CM

## 2016-02-25 DIAGNOSIS — K219 Gastro-esophageal reflux disease without esophagitis: Secondary | ICD-10-CM

## 2016-02-25 DIAGNOSIS — Z23 Encounter for immunization: Secondary | ICD-10-CM

## 2016-02-25 DIAGNOSIS — I428 Other cardiomyopathies: Secondary | ICD-10-CM

## 2016-02-25 DIAGNOSIS — F419 Anxiety disorder, unspecified: Secondary | ICD-10-CM

## 2016-02-25 DIAGNOSIS — I420 Dilated cardiomyopathy: Secondary | ICD-10-CM

## 2016-02-25 DIAGNOSIS — I739 Peripheral vascular disease, unspecified: Secondary | ICD-10-CM

## 2016-02-25 DIAGNOSIS — N184 Chronic kidney disease, stage 4 (severe): Secondary | ICD-10-CM

## 2016-02-25 NOTE — Progress Notes (Signed)
Pre visit review using our clinic review tool, if applicable. No additional management support is needed unless otherwise documented below in the visit note. 

## 2016-02-25 NOTE — Progress Notes (Signed)
Patient ID: Sandra Ellison, female   DOB: June 05, 1946, 70 y.o.   MRN: [id number]   Subjective:    Patient ID: Sandra Ellison, female    DOB: January 26, 1946, 70 y.o.   MRN: [id number]  HPI  Patient here for a scheduled follow up.  She feels better.  Breathing better.  No chest pain.  Energy improved.  More active.  Eating better.  Appetite better.  No nausea or vomiting.  Bowels stable.     Past Medical History:  Diagnosis Date  . Allergy   . Arthritis   . Carotid arterial disease ([place])    a. 02/2015 Carotid U/S: RICA [id number], LICA 123456.  . Chronic combined systolic and diastolic CHF (congestive heart failure) ([place])    a. 01/2012 Echo: EF 30-35%, mod conc LVH, sev inf HK, mildly dil LA, mild-mod MR, Trace TR, RVSP 40-59mmHg.    Marland Kitchen Chronic kidney disease (CKD), stage II (mild)   . COPD (chronic obstructive pulmonary disease) ([place])   . Dilated cardiomyopathy ([place])    a. 02/2011 MV: EF 27%, anteroseptal, inf, inferolateral scar, no ischemia.  Marland Kitchen GERD (gastroesophageal reflux disease)   . History of abnormal Pap smear    a. s/p vaginal hysterectomy  . History of diverticulosis   . Hx of colonic polyps   . Hyperkalemia    a. 02/2015 in setting of entresto/AKI->entresto d/c'd.  . Hypertension   . Iron deficiency anemia    a. h/o transfusions, prev followed by heme/onc.  . Pulmonary HTN ([place])   . TIA (transient ischemic attack)   . Tobacco abuse    Past Surgical History:  Procedure Laterality Date  . CARDIAC CATHETERIZATION Left 08/05/2015   Procedure: Left Heart Cath and Coronary Angiography;  Surgeon: Minna Merritts, MD;  Location: [facility];  Service: Cardiovascular;  Laterality: Left;  . COLONOSCOPY  10/2010  . ORIF DISTAL RADIUS FRACTURE  2011   right  . TONSILLECTOMY  age 72  . VAGINAL HYSTERECTOMY  1981   secondary to abnormal pap smear   Family History  Problem Relation Age of Onset  . Arthritis Mother   . Alcohol abuse Father   . Arthritis Father   . Cancer  Father     throat/spine  . Breast cancer Neg Hx    Social History   Social History  . Marital status: Divorced    Spouse name: N/A  . Number of children: 1  . Years of education: N/A   Social History Main Topics  . Smoking status: Current Every Day Smoker    Packs/day: 0.50    Years: 42.00    Types: Cigarettes  . Smokeless tobacco: Never Used     Comment: Down to 3 cigarettes per day  . Alcohol use No  . Drug use: No  . Sexual activity: Not Asked   Other Topics Concern  . None   Social History Narrative  . None    Outpatient Encounter Prescriptions as of 02/25/2016  Medication Sig  . albuterol (PROVENTIL) (2.5 MG/3ML) 0.083% nebulizer solution Take 3 mLs by nebulization every 4 (four) hours as needed. For wheezing/shortness of breath.  Marland Kitchen atorvastatin (LIPITOR) 40 MG tablet Take 1 tablet (40 mg total) by mouth daily.  . carvedilol (COREG) 6.25 MG tablet Take 6.25 mg by mouth 2 (two) times daily with a meal.  . citalopram (CELEXA) 10 MG tablet TAKE 1 TABLET (10 MG TOTAL) BY MOUTH DAILY.  Marland Kitchen clopidogrel (PLAVIX) 75 MG tablet TAKE 1 TABLET (  75 MG TOTAL) BY MOUTH DAILY.  Marland Kitchen Fe Fum-FePoly-Vit C-Vit B3 (INTEGRA) 62.5-62.5-40-3 MG CAPS One per day (Patient taking differently: Take 1 capsule by mouth daily. )  . furosemide (LASIX) 20 MG tablet TAKE 1 TABLET (20 MG TOTAL) BY MOUTH DAILY AS NEEDED.  Marland Kitchen gabapentin (NEURONTIN) 100 MG capsule TAKE 1 CAPSULE (100 MG TOTAL) BY MOUTH 3 (THREE) TIMES DAILY AS NEEDED.  Marland Kitchen meclizine (ANTIVERT) 12.5 MG tablet Take 1 tablet (12.5 mg total) by mouth 3 (three) times daily as needed for dizziness.  . pantoprazole (PROTONIX) 40 MG tablet TAKE 1 TABLET (40 MG TOTAL) BY MOUTH DAILY.  Marland Kitchen tiotropium (SPIRIVA HANDIHALER) 18 MCG inhalation capsule One inhalation daily (Patient taking differently: Place 18 mcg into inhaler and inhale daily. )  . VENTOLIN HFA 108 (90 Base) MCG/ACT inhaler INHALE 2 PUFFS EVERY 6 HOURS AS NEEDED FOR WHEEZING OR SHORTNESS OF BREATH    . [DISCONTINUED] lisinopril (PRINIVIL,ZESTRIL) 10 MG tablet Take 10 mg by mouth daily.  . [DISCONTINUED] oxyCODONE (OXY IR/ROXICODONE) 5 MG immediate release tablet Take 1 tablet (5 mg total) by mouth every 4 (four) hours as needed for moderate pain.   No facility-administered encounter medications on file as of 02/25/2016.     Review of Systems  Constitutional: Negative for appetite change and unexpected weight change.  HENT: Negative for congestion and sinus pressure.   Respiratory: Negative for cough, chest tightness and shortness of breath.   Cardiovascular: Negative for chest pain, palpitations and leg swelling.  Gastrointestinal: Negative for abdominal pain, diarrhea, nausea and vomiting.  Genitourinary: Negative for difficulty urinating and dysuria.  Musculoskeletal: Negative for back pain and joint swelling.  Skin: Negative for color change and rash.  Neurological: Negative for dizziness, light-headedness and headaches.  Psychiatric/Behavioral: Negative for agitation and dysphoric mood.       Objective:    Physical Exam  Constitutional: She appears well-developed and well-nourished. No distress.  HENT:  Nose: Nose normal.  Mouth/Throat: Oropharynx is clear and moist.  Neck: Neck supple. No thyromegaly present.  Cardiovascular: Normal rate and regular rhythm.   Pulmonary/Chest: Breath sounds normal. No respiratory distress. She has no wheezes.  Abdominal: Soft. Bowel sounds are normal. There is no tenderness.  Musculoskeletal: She exhibits no edema or tenderness.  Lymphadenopathy:    She has no cervical adenopathy.  Skin: No rash noted. No erythema.  Psychiatric: She has a normal mood and affect. Her behavior is normal.    BP 124/80   Pulse 76   Temp 97.5 F (36.4 C) (Oral)   Ht 5\' 5"  (1.651 m)   Wt 133 lb 3.2 oz (60.4 kg)   SpO2 94%   BMI 22.17 kg/m  Wt Readings from Last 3 Encounters:  02/25/16 133 lb 3.2 oz (60.4 kg)  01/14/16 125 lb (56.7 kg)  01/07/16  121 lb 4 oz (55 kg)     Lab Results  Component Value Date   WBC 7.1 11/24/2015   HGB 13.7 11/24/2015   HCT 41.9 11/24/2015   PLT 234 11/24/2015   GLUCOSE 86 01/29/2016   CHOL 75 11/25/2015   TRIG 99 11/25/2015   HDL 25 (L) 11/25/2015   LDLCALC 30 11/25/2015   ALT 19 11/24/2015   AST 36 11/24/2015   NA 137 01/29/2016   K 4.7 02/05/2016   CL 105 01/29/2016   CREATININE 1.39 (H) 01/29/2016   BUN 27 (H) 01/29/2016   CO2 26 01/29/2016   TSH 2.67 08/29/2015   INR 1.10 11/24/2015  HGBA1C 5.6 11/25/2015       Assessment & Plan:   Problem List Items Addressed This Visit    Abnormal mammogram    Mammogram ordered.  Needs f/u mammogram.        Anxiety    Doing better on citalopram.        CAD (coronary artery disease)    Followed by cardiology.       Carotid arterial disease (HCC)    Carotid ultrasound as outlined.  Continue risk factor modification.  Followed by cardiology.        CKD (chronic kidney disease), stage IV (HCC)    Last creatinine 1.39.  Off lisinopril.  Doing better.  Stay hydrated.  Follow.       COPD (chronic obstructive pulmonary disease) (HCC)    Continue inhalers.  Breathing better.        Dilated cardiomyopathy ([place])    Followed by cardiology.  Stable.  Doing better.  Breathing better.  Energy better.        Relevant Orders   Lipid panel   Hepatic function panel   Essential hypertension    Blood pressure under good control.  Continue same medication regimen.  Follow pressures.  Follow metabolic panel.        Relevant Orders   Basic metabolic panel   GERD (gastroesophageal reflux disease)    On protonix.  Stable.        Nonischemic cardiomyopathy ([place])    Followed by cardiology.  Breathing better.  Increased potassium with lisinopril.  Off lisinopril.  Follow.  Doing better.        Pulmonary hypertension ([place])    ECHO with minimally elevated right heart pressure.  Followed by cardiology.       Tobacco abuse    Discussed the  need to quit smoking.  She has decreased.  Follow.         Other Visit Diagnoses    Hyperglycemia    -  Primary   Relevant Orders   Hemoglobin A1c   Encounter for immunization       Relevant Orders   Flu vaccine HIGH DOSE PF (Completed)     I spent 25 minutes with the patient and more than 50% of the time was spent in consultation regarding the above.     Einar Pheasant, MD

## 2016-03-01 ENCOUNTER — Other Ambulatory Visit: Payer: Self-pay | Admitting: Nurse Practitioner

## 2016-03-01 ENCOUNTER — Encounter: Payer: Self-pay | Admitting: Internal Medicine

## 2016-03-01 NOTE — Assessment & Plan Note (Signed)
ECHO with minimally elevated right heart pressure.  Followed by cardiology.

## 2016-03-01 NOTE — Assessment & Plan Note (Signed)
Followed by cardiology.  Stable.  Doing better.  Breathing better.  Energy better.

## 2016-03-01 NOTE — Assessment & Plan Note (Signed)
Carotid ultrasound as outlined.  Continue risk factor modification.  Followed by cardiology.

## 2016-03-01 NOTE — Assessment & Plan Note (Signed)
On protonix.  Stable.  

## 2016-03-01 NOTE — Assessment & Plan Note (Signed)
Discussed the need to quit smoking.  She has decreased. Follow.   

## 2016-03-01 NOTE — Assessment & Plan Note (Signed)
Blood pressure under good control.  Continue same medication regimen.  Follow pressures.  Follow metabolic panel.   

## 2016-03-01 NOTE — Assessment & Plan Note (Signed)
Mammogram ordered.  Needs f/u mammogram.

## 2016-03-01 NOTE — Assessment & Plan Note (Signed)
Followed by cardiology 

## 2016-03-01 NOTE — Assessment & Plan Note (Signed)
Followed by cardiology.  Breathing better.  Increased potassium with lisinopril.  Off lisinopril.  Follow.  Doing better.

## 2016-03-01 NOTE — Assessment & Plan Note (Signed)
Doing better on citalopram.

## 2016-03-01 NOTE — Assessment & Plan Note (Signed)
Continue inhalers.  Breathing better.

## 2016-03-01 NOTE — Assessment & Plan Note (Signed)
Last creatinine 1.39.  Off lisinopril.  Doing better.  Stay hydrated.  Follow.

## 2016-03-08 ENCOUNTER — Other Ambulatory Visit: Payer: Self-pay | Admitting: Internal Medicine

## 2016-03-17 ENCOUNTER — Other Ambulatory Visit: Payer: Self-pay | Admitting: Cardiovascular Disease

## 2016-03-17 NOTE — Telephone Encounter (Signed)
Ok to refill hydralazine?

## 2016-03-24 ENCOUNTER — Other Ambulatory Visit: Payer: Self-pay | Admitting: Internal Medicine

## 2016-03-26 ENCOUNTER — Other Ambulatory Visit (INDEPENDENT_AMBULATORY_CARE_PROVIDER_SITE_OTHER): Payer: Commercial Managed Care - HMO

## 2016-03-26 DIAGNOSIS — I420 Dilated cardiomyopathy: Secondary | ICD-10-CM

## 2016-03-26 DIAGNOSIS — I10 Essential (primary) hypertension: Secondary | ICD-10-CM | POA: Diagnosis not present

## 2016-03-26 DIAGNOSIS — R739 Hyperglycemia, unspecified: Secondary | ICD-10-CM

## 2016-03-26 LAB — BASIC METABOLIC PANEL
BUN: 27 mg/dL — ABNORMAL HIGH (ref 6–23)
CHLORIDE: 105 meq/L (ref 96–112)
CO2: 27 meq/L (ref 19–32)
CREATININE: 1.39 mg/dL — AB (ref 0.40–1.20)
Calcium: 9.3 mg/dL (ref 8.4–10.5)
GFR: 39.85 mL/min — ABNORMAL LOW (ref >60.00)
Glucose, Bld: 99 mg/dL (ref 70–99)
POTASSIUM: 4.9 meq/L (ref 3.5–5.1)
Sodium: 141 mEq/L (ref 135–145)

## 2016-03-26 LAB — HEPATIC FUNCTION PANEL
ALT: 15 U/L (ref 0–35)
AST: 18 U/L (ref 0–37)
Albumin: 4.1 g/dL (ref 3.5–5.2)
Alkaline Phosphatase: 80 U/L (ref 39–117)
BILIRUBIN TOTAL: 0.8 mg/dL (ref 0.2–1.2)
Bilirubin, Direct: 0.2 mg/dL (ref 0.0–0.3)
Total Protein: 6.6 g/dL (ref 6.0–8.3)

## 2016-03-26 LAB — LIPID PANEL
CHOLESTEROL: 91 mg/dL (ref 0–200)
HDL: 30.6 mg/dL — ABNORMAL LOW (ref >39.00)
LDL CALC: 39 mg/dL (ref 0–99)
NonHDL: 60.47
Total CHOL/HDL Ratio: 3
Triglycerides: 105 mg/dL (ref 0.0–149.0)
VLDL: 21 mg/dL (ref 0.0–40.0)

## 2016-03-26 LAB — HEMOGLOBIN A1C: Hgb A1c MFr Bld: 5 % (ref 4.6–6.5)

## 2016-03-27 ENCOUNTER — Telehealth: Payer: Self-pay | Admitting: *Deleted

## 2016-03-27 NOTE — Telephone Encounter (Signed)
Patient returned your call.

## 2016-03-27 NOTE — Telephone Encounter (Signed)
Pt requested lab results  Pt contact [phone]

## 2016-03-27 NOTE — Telephone Encounter (Signed)
Left a VM again, thanks

## 2016-03-30 ENCOUNTER — Other Ambulatory Visit: Payer: Self-pay | Admitting: Internal Medicine

## 2016-03-30 DIAGNOSIS — N183 Chronic kidney disease, stage 3 unspecified: Secondary | ICD-10-CM

## 2016-03-30 NOTE — Telephone Encounter (Signed)
Pt called requesting lab results.  Call pt @ [phone]

## 2016-03-30 NOTE — Telephone Encounter (Signed)
Please advise 

## 2016-03-30 NOTE — Progress Notes (Signed)
Order placed for nephrology referral.   °

## 2016-03-30 NOTE — Telephone Encounter (Signed)
Spoke with the patient, see result note. thanks 

## 2016-03-31 ENCOUNTER — Other Ambulatory Visit: Payer: Self-pay | Admitting: Internal Medicine

## 2016-04-02 ENCOUNTER — Encounter: Payer: Self-pay | Admitting: *Deleted

## 2016-04-16 ENCOUNTER — Other Ambulatory Visit: Payer: Self-pay | Admitting: Cardiovascular Disease

## 2016-04-17 ENCOUNTER — Other Ambulatory Visit: Payer: Self-pay | Admitting: Internal Medicine

## 2016-04-17 NOTE — Telephone Encounter (Signed)
Patient states she will give [place] a call back if she feels she needs an appointment, patient has appmt 05/27/16 for follow up

## 2016-04-17 NOTE — Telephone Encounter (Signed)
Last filled 11/27/15 30 0rf

## 2016-04-17 NOTE — Telephone Encounter (Signed)
Since this is not a medication that she is supposed to be taking regularly, need to confirm still needs the medication.  If having dizziness, etc - would need to be seen.

## 2016-04-17 NOTE — Telephone Encounter (Signed)
Pt called requesting a refill on meclizine (ANTIVERT) 12.5 MG tablet. Please advise, thank you!  Pharmacy - [id number] - [place], [place]  Call pt @ [phone]

## 2016-04-20 ENCOUNTER — Other Ambulatory Visit: Payer: Self-pay | Admitting: Internal Medicine

## 2016-04-25 DIAGNOSIS — J029 Acute pharyngitis, unspecified: Secondary | ICD-10-CM | POA: Diagnosis not present

## 2016-04-25 DIAGNOSIS — J019 Acute sinusitis, unspecified: Secondary | ICD-10-CM | POA: Diagnosis not present

## 2016-05-05 ENCOUNTER — Other Ambulatory Visit: Payer: Self-pay | Admitting: Internal Medicine

## 2016-05-07 ENCOUNTER — Telehealth: Payer: Self-pay | Admitting: *Deleted

## 2016-05-07 ENCOUNTER — Emergency Department
Admission: EM | Admit: 2016-05-07 | Discharge: 2016-05-07 | Disposition: A | Discharge status: Home / Self Care | Payer: Commercial Managed Care - HMO | Attending: Emergency Medicine | Admitting: Emergency Medicine

## 2016-05-07 ENCOUNTER — Encounter: Payer: Self-pay | Admitting: Emergency Medicine

## 2016-05-07 ENCOUNTER — Telehealth: Payer: Self-pay | Admitting: Internal Medicine

## 2016-05-07 DIAGNOSIS — I130 Hypertensive heart and chronic kidney disease with heart failure and stage 1 through stage 4 chronic kidney disease, or unspecified chronic kidney disease: Secondary | ICD-10-CM | POA: Diagnosis not present

## 2016-05-07 DIAGNOSIS — Z79899 Other long term (current) drug therapy: Secondary | ICD-10-CM | POA: Insufficient documentation

## 2016-05-07 DIAGNOSIS — F419 Anxiety disorder, unspecified: Secondary | ICD-10-CM | POA: Insufficient documentation

## 2016-05-07 DIAGNOSIS — F17210 Nicotine dependence, cigarettes, uncomplicated: Secondary | ICD-10-CM | POA: Diagnosis not present

## 2016-05-07 DIAGNOSIS — N184 Chronic kidney disease, stage 4 (severe): Secondary | ICD-10-CM | POA: Diagnosis not present

## 2016-05-07 DIAGNOSIS — I2510 Atherosclerotic heart disease of native coronary artery without angina pectoris: Secondary | ICD-10-CM | POA: Insufficient documentation

## 2016-05-07 DIAGNOSIS — I5042 Chronic combined systolic (congestive) and diastolic (congestive) heart failure: Secondary | ICD-10-CM | POA: Insufficient documentation

## 2016-05-07 DIAGNOSIS — R112 Nausea with vomiting, unspecified: Secondary | ICD-10-CM

## 2016-05-07 DIAGNOSIS — J449 Chronic obstructive pulmonary disease, unspecified: Secondary | ICD-10-CM | POA: Diagnosis not present

## 2016-05-07 LAB — URINALYSIS COMPLETE WITH MICROSCOPIC (ARMC ONLY)
Bilirubin Urine: NEGATIVE
GLUCOSE, UA: NEGATIVE mg/dL
HGB URINE DIPSTICK: NEGATIVE
Ketones, ur: NEGATIVE mg/dL
Leukocytes, UA: NEGATIVE
Nitrite: NEGATIVE
PH: 5 (ref 5.0–8.0)
Protein, ur: 100 mg/dL — AB
SPECIFIC GRAVITY, URINE: 1.023 (ref 1.005–1.030)

## 2016-05-07 LAB — CBC
HEMATOCRIT: 45.3 % (ref 35.0–47.0)
HEMOGLOBIN: 14.9 g/dL (ref 12.0–16.0)
MCH: 30.8 pg (ref 26.0–34.0)
MCHC: 32.9 g/dL (ref 32.0–36.0)
MCV: 93.8 fL (ref 80.0–100.0)
Platelets: 195 10*3/uL (ref 150–440)
RBC: 4.82 MIL/uL (ref 3.80–5.20)
RDW: 14.2 % (ref 11.5–14.5)
WBC: 8.3 10*3/uL (ref 3.6–11.0)

## 2016-05-07 LAB — BASIC METABOLIC PANEL
ANION GAP: 11 (ref 5–15)
BUN: 22 mg/dL — ABNORMAL HIGH (ref 6–20)
CALCIUM: 9.3 mg/dL (ref 8.9–10.3)
CHLORIDE: 104 mmol/L (ref 101–111)
CO2: 23 mmol/L (ref 22–32)
Creatinine, Ser: 1.18 mg/dL — ABNORMAL HIGH (ref 0.44–1.00)
GFR calc non Af Amer: 46 mL/min — ABNORMAL LOW (ref >60)
GFR, EST AFRICAN AMERICAN: 53 mL/min — AB (ref >60)
GLUCOSE: 131 mg/dL — AB (ref 65–99)
POTASSIUM: 4.7 mmol/L (ref 3.5–5.1)
Sodium: 138 mmol/L (ref 135–145)

## 2016-05-07 MED ORDER — LORAZEPAM 0.5 MG PO TABS: 0.5000 mg | ORAL_TABLET | Freq: Three times a day (TID) | ORAL | 0 refills | Status: DC | PRN

## 2016-05-07 MED ORDER — ONDANSETRON 4 MG PO TBDP: 4.0000 mg | ORAL_TABLET | Freq: Three times a day (TID) | ORAL | 0 refills | Status: DC | PRN

## 2016-05-07 MED ORDER — ONDANSETRON HCL 4 MG/2ML IJ SOLN
INTRAMUSCULAR | Status: AC
  Administered 2016-05-07: 4 mg via INTRAVENOUS
  Filled 2016-05-07: qty 2

## 2016-05-07 MED ORDER — DIAZEPAM 5 MG PO TABS
5.0000 mg | ORAL_TABLET | Freq: Once | ORAL | Status: AC
Start: 2016-05-07 — End: 2016-05-07
  Administered 2016-05-07: 5 mg via ORAL
  Filled 2016-05-07: qty 1

## 2016-05-07 MED ORDER — SODIUM CHLORIDE 0.9 % IV SOLN
Freq: Once | INTRAVENOUS | Status: AC
  Administered 2016-05-07: 20:00:00 via INTRAVENOUS

## 2016-05-07 MED ORDER — ONDANSETRON 4 MG PO TBDP
4.0000 mg | ORAL_TABLET | Freq: Once | ORAL | Status: AC
  Administered 2016-05-07: 4 mg via ORAL
  Filled 2016-05-07: qty 1

## 2016-05-07 MED ORDER — ONDANSETRON HCL 4 MG/2ML IJ SOLN
4.0000 mg | Freq: Once | INTRAMUSCULAR | Status: AC
  Administered 2016-05-07: 4 mg via INTRAVENOUS

## 2016-05-07 NOTE — ED Notes (Signed)
Patient presents to treatment room complaining of feeling tired and light headed.  Patient states, "I've felt this way since my stroke a few months ago.  I think the medicine makes me feel bad but today I feel worse than normal."

## 2016-05-07 NOTE — ED Notes (Signed)

## 2016-05-07 NOTE — ED Provider Notes (Signed)
[facility] Emergency Department Provider Note        Time seen: ----------------------------------------- 7:05 PM on 05/07/2016 -----------------------------------------    I have reviewed the triage vital signs and the nursing notes.   HISTORY  Chief Complaint Nausea and Fatigue    HPI Sandra Ellison is a 70 y.o. female who presents to ER with weakness and nausea with some diarrhea. Patient states she feels very anxious about being sick. She states she has to watch her fluid intake, nothing makes her symptoms better or worse. She denies fevers, chills, chest pain but has chronic shortness of breath, denies other symptoms at this time.   Past Medical History:  Diagnosis Date  . Allergy   . Arthritis   . Carotid arterial disease ([place])    a. 02/2015 Carotid U/S: RICA A999333, LICA [id number].  . Chronic combined systolic and diastolic CHF (congestive heart failure) ([place])    a. 01/2012 Echo: EF 30-35%, mod conc LVH, sev inf HK, mildly dil LA, mild-mod MR, Trace TR, RVSP 40-44mmHg.    Marland Kitchen Chronic kidney disease (CKD), stage II (mild)   . COPD (chronic obstructive pulmonary disease) ([place])   . Dilated cardiomyopathy ([place])    a. 02/2011 MV: EF 27%, anteroseptal, inf, inferolateral scar, no ischemia.  Marland Kitchen GERD (gastroesophageal reflux disease)   . History of abnormal Pap smear    a. s/p vaginal hysterectomy  . History of diverticulosis   . Hx of colonic polyps   . Hyperkalemia    a. 02/2015 in setting of entresto/AKI->entresto d/c'd.  . Hypertension   . Iron deficiency anemia    a. h/o transfusions, prev followed by heme/onc.  . Pulmonary HTN   . TIA (transient ischemic attack)   . Tobacco abuse     Patient Active Problem List   Diagnosis Date Noted  . CVA (cerebral infarction) 11/25/2015  . TIA (transient ischemic attack) 11/24/2015  . Anxiety 08/31/2015  . Congestive dilated cardiomyopathy ([place])   . Cardiomyopathy, ischemic 07/24/2015  . Foot swelling  06/15/2015  . Chronic combined systolic and diastolic CHF (congestive heart failure) ([place])   . Dilated cardiomyopathy ([place])   . Carotid arterial disease ([place])   . Tobacco abuse   . Essential hypertension 03/17/2015  . CKD (chronic kidney disease), stage IV ([place]) 03/06/2015  . Abnormal mammogram 02/23/2015  . Encounter for screening colonoscopy 01/26/2015  . Health care maintenance 08/12/2014  . Chronic systolic CHF (congestive heart failure) ([place]) 05/21/2014  . Microcalcifications of the breast 01/24/2014  . Knee pain, bilateral 07/22/2013  . Pulmonary nodule 05/20/2013  . Insomnia 03/08/2013  . Left knee pain 03/08/2013  . Ulnar nerve injury 03/07/2013  . GERD (gastroesophageal reflux disease) 02/28/2013  . History of colonic polyps 02/28/2013  . Diverticulosis 02/28/2013  . Anemia 02/28/2013  . COPD (chronic obstructive pulmonary disease) ([place]) 02/28/2013  . Pulmonary hypertension 02/28/2013  . Nonischemic cardiomyopathy ([place]) 02/26/2012  . Carotid bruit 02/26/2012  . SOB (shortness of breath) 03/03/2011  . Smoking 03/03/2011  . Hyperlipidemia 03/03/2011  . CAD (coronary artery disease) 03/03/2011    Past Surgical History:  Procedure Laterality Date  . CARDIAC CATHETERIZATION Left 08/05/2015   Procedure: Left Heart Cath and Coronary Angiography;  Surgeon: Minna Merritts, MD;  Location: [facility];  Service: Cardiovascular;  Laterality: Left;  . COLONOSCOPY  10/2010  . ORIF DISTAL RADIUS FRACTURE  2011   right  . TONSILLECTOMY  age 7  . [place]  secondary to abnormal pap smear    Allergies Ranitidine; Ranitidine hcl; and Entresto [sacubitril-valsartan]  Social History Social History  Substance Use Topics  . Smoking status: Current Every Day Smoker    Packs/day: 0.50    Years: 42.00    Types: Cigarettes  . Smokeless tobacco: Never Used     Comment: Down to 3 cigarettes per day  . Alcohol use No    Review of  Systems Constitutional: Negative for fever. Cardiovascular: Negative for chest pain. Respiratory: Negative for shortness of breath. Gastrointestinal: Negative for abdominal pain, Positive for nausea Genitourinary: Negative for dysuria. Musculoskeletal: Negative for back pain. Skin: Negative for rash. Neurological: Positive for weakness  10-point ROS otherwise negative.  ____________________________________________   PHYSICAL EXAM:  VITAL SIGNS: ED Triage Vitals [05/07/16 1744]  Enc Vitals Group     BP (!) 151/46     Pulse Rate 93     Resp 20     Temp 97.9 F (36.6 C)     Temp Source Oral     SpO2 98 %     Weight 133 lb (60.3 kg)     Height 5\' 5"  (1.651 m)     Head Circumference      Peak Flow      Pain Score      Pain Loc      Pain Edu?      Excl. in [place]?     Constitutional: Alert and oriented. Anxious, mild distress Eyes: Conjunctivae are normal. PERRL. Normal extraocular movements. ENT   Head: Normocephalic and atraumatic.   Nose: No congestion/rhinnorhea.   Mouth/Throat: Mucous membranes are moist.   Neck: No stridor. Cardiovascular: Normal rate, regular rhythm. No murmurs, rubs, or gallops. Respiratory: Normal respiratory effort without tachypnea nor retractions. Breath sounds are clear and equal bilaterally. No wheezes/rales/rhonchi. Gastrointestinal: Soft and nontender. Normal bowel sounds Musculoskeletal: Nontender with normal range of motion in all extremities. No lower extremity tenderness nor edema. Neurologic:  Normal speech and language. No gross focal neurologic deficits are appreciated.  Skin:  Skin is warm, dry and intact. No rash noted. Psychiatric: Depressed mood and affect ____________________________________________  EKG: Interpreted by me. Sinus rhythm rate of 91 bpm, normal PR interval, normal QRS size, normal QT, nonspecific ST segment changes  ____________________________________________  ED COURSE:  Pertinent labs & imaging  results that were available during my care of the patient were reviewed by me and considered in my medical decision making (see chart for details). Clinical Course   Patient is in no distress, we will assess with labs, give oral Valium and Zofran.  Procedures ____________________________________________   LABS (pertinent positives/negatives)  Labs Reviewed  BASIC METABOLIC PANEL - Abnormal; Notable for the following:       Result Value   Glucose, Bld 131 (*)    BUN 22 (*)    Creatinine, Ser 1.18 (*)    GFR calc non Af Amer 46 (*)    GFR calc Af Amer 53 (*)    All other components within normal limits  URINALYSIS COMPLETEWITH MICROSCOPIC ([facility] ONLY) - Abnormal; Notable for the following:    Color, Urine AMBER (*)    APPearance CLEAR (*)    Protein, ur 100 (*)    Bacteria, UA RARE (*)    Squamous Epithelial / LPF 0-5 (*)    All other components within normal limits  CBC   ____________________________________________  FINAL ASSESSMENT AND PLAN  Anxiety, nausea, weakness  Plan: Patient with labs as dictated above. No  clear etiology for her symptoms other than anxiety and/or GI upset. She'll be discharged with antiemetics, anxiolytics and encouraged to have close follow-up with her doctor.   Earleen Newport, MD   Note: This dictation was prepared with Dragon dictation. Any transcriptional errors that result from this process are unintentional    Earleen Newport, MD 05/07/16 2145

## 2016-05-07 NOTE — Telephone Encounter (Signed)
Patient is having dizziness, sweats, nausea with fatigue. Patient was transferred to the nurse line  This has been going on all day.

## 2016-05-07 NOTE — Telephone Encounter (Signed)
Pt was advised to go to a urgent care [facility]. Pt agreed and will go tonight.

## 2016-05-07 NOTE — ED Triage Notes (Signed)
Pt presents with weakness and nausea with some diarrhea.

## 2016-05-07 NOTE — ED Notes (Signed)
Pt vomited up valium and spit out PO zofran.

## 2016-05-07 NOTE — Telephone Encounter (Signed)
FYI no appt made

## 2016-05-07 NOTE — Telephone Encounter (Signed)
Hold until [facility] note.

## 2016-05-07 NOTE — Telephone Encounter (Signed)
Patient Name: Sandra Ellison  DOB: January 16, 1946    Initial Comment Caller states thather drandmother is having dizziness, sweating, weakness, nausea and fatigue.    Nurse Assessment  Nurse: Adella Nissen, RN, Leitha Schuller Date/Time ([place] Time): 05/07/2016 3:58:55 PM  Confirm and document reason for call. If symptomatic, describe symptoms. ---Caller states feels that her grandmother is hot and clammy, although the skin does not really feel feverish, dizziness, nauseous, no pain, was on antibiotic for potential strep throat, antibiotic seems to causing diarrhea, uses cane and sometimes walker, but ambulating about the same, no worse shortness of breath  Does the patient have any new or worsening symptoms? ---Yes  Will a triage be completed? ---Yes  Related visit to physician within the last 2 weeks? ---No  Does the PT have any chronic conditions? (i.e. diabetes, asthma, etc.) ---Yes  List chronic conditions. ---COPD, arthritis in knees and ankles, bothers her frequently, recent strep throat, no rashes  Is this a behavioral health or substance abuse call? ---No     Guidelines    Guideline Title Affirmed Question Affirmed Notes  Dizziness - Lightheadedness [1] MODERATE dizziness (e.g., interferes with normal activities) AND [2] has NOT been evaluated by physician for this (Exception: dizziness caused by heat exposure, sudden standing, or poor fluid intake)    Final Disposition User   See Physician within [place], RN, Leitha Schuller    Comments  Attempted to make appt - all providers booked. The office suggested [facility] but the granddaughter said she would not want to go anywhere else. She stated she will see how she does though the night and if she is not better or worse, she will take her to the ER.   Referrals  REFERRED TO PCP OFFICE   Disagree/Comply: Comply

## 2016-05-07 NOTE — ED Notes (Signed)
Pt transported to bathroom via WC. Pt unable to urinate at this time.

## 2016-05-07 NOTE — Telephone Encounter (Signed)
Just received message.  With her history, if she is having these symptoms - she needs to go ahead and be seen today and then can f/u.  Need to make sure nothing more acute going on.

## 2016-05-08 ENCOUNTER — Telehealth: Payer: Self-pay | Admitting: Internal Medicine

## 2016-05-08 NOTE — Telephone Encounter (Signed)
Please see how patient is doing

## 2016-05-08 NOTE — Telephone Encounter (Signed)
Pt called and wanted to let you know that she went to the ED last night and that Dr. Jimmye Norman was the doctor on call.   Call pt @ [phone]

## 2016-05-08 NOTE — Telephone Encounter (Signed)
Patient advised of below and verbalized understanding.  

## 2016-05-08 NOTE — Telephone Encounter (Signed)
Thank you.  Tell her to keep [place] posted and let us know if feels she needs to be seen or any change.

## 2016-05-08 NOTE — Telephone Encounter (Signed)
fyi

## 2016-05-08 NOTE — Telephone Encounter (Signed)
Reviewed ER records.  It appears she was given medication for nausea, anxiety, etc.  Need to know how she is doing, etc.

## 2016-05-08 NOTE — Telephone Encounter (Signed)
She was advised to go to the Urgent care, ED last night by Triage

## 2016-05-08 NOTE — Telephone Encounter (Signed)
Spoke with patient states she is feeling better today.  She is going to try to eat something today.  Advised her to try to stick to B.R.A.T diet patient verbalized understanding.    She states MD looked at her feet and didn't increase her lasix and she will keep her feet elevated.

## 2016-05-18 ENCOUNTER — Ambulatory Visit: Payer: Self-pay | Admitting: Family Medicine

## 2016-05-27 ENCOUNTER — Ambulatory Visit (INDEPENDENT_AMBULATORY_CARE_PROVIDER_SITE_OTHER): Payer: Commercial Managed Care - HMO | Admitting: Internal Medicine

## 2016-05-27 ENCOUNTER — Telehealth: Payer: Self-pay | Admitting: *Deleted

## 2016-05-27 ENCOUNTER — Encounter: Payer: Self-pay | Admitting: Internal Medicine

## 2016-05-27 VITALS — BP 122/76 | HR 73 | Temp 97.6°F | Ht 65.0 in | Wt 139.6 lb

## 2016-05-27 DIAGNOSIS — I779 Disorder of arteries and arterioles, unspecified: Secondary | ICD-10-CM

## 2016-05-27 DIAGNOSIS — F419 Anxiety disorder, unspecified: Secondary | ICD-10-CM

## 2016-05-27 DIAGNOSIS — I428 Other cardiomyopathies: Secondary | ICD-10-CM | POA: Diagnosis not present

## 2016-05-27 DIAGNOSIS — E785 Hyperlipidemia, unspecified: Secondary | ICD-10-CM | POA: Diagnosis not present

## 2016-05-27 DIAGNOSIS — I739 Peripheral vascular disease, unspecified: Secondary | ICD-10-CM

## 2016-05-27 DIAGNOSIS — J449 Chronic obstructive pulmonary disease, unspecified: Secondary | ICD-10-CM

## 2016-05-27 DIAGNOSIS — R52 Pain, unspecified: Secondary | ICD-10-CM

## 2016-05-27 DIAGNOSIS — N184 Chronic kidney disease, stage 4 (severe): Secondary | ICD-10-CM

## 2016-05-27 DIAGNOSIS — K219 Gastro-esophageal reflux disease without esophagitis: Secondary | ICD-10-CM | POA: Diagnosis not present

## 2016-05-27 DIAGNOSIS — K6289 Other specified diseases of anus and rectum: Secondary | ICD-10-CM

## 2016-05-27 DIAGNOSIS — I10 Essential (primary) hypertension: Secondary | ICD-10-CM

## 2016-05-27 LAB — BASIC METABOLIC PANEL
BUN: 27 mg/dL — ABNORMAL HIGH (ref 6–23)
CHLORIDE: 97 meq/L (ref 96–112)
CO2: 29 meq/L (ref 19–32)
CREATININE: 1.43 mg/dL — AB (ref 0.40–1.20)
Calcium: 8.8 mg/dL (ref 8.4–10.5)
GFR: 38.55 mL/min — ABNORMAL LOW (ref >60.00)
GLUCOSE: 96 mg/dL (ref 70–99)
Potassium: 4.5 mEq/L (ref 3.5–5.1)
SODIUM: 136 meq/L (ref 135–145)

## 2016-05-27 LAB — CK: CK TOTAL: 31 U/L (ref 7–177)

## 2016-05-27 LAB — SEDIMENTATION RATE: Sed Rate: 3 mm/hr (ref 0–30)

## 2016-05-27 MED ORDER — HYDROCORTISONE ACETATE 25 MG RE SUPP: 25.0000 mg | Freq: Two times a day (BID) | RECTAL | 0 refills | Status: DC

## 2016-05-27 NOTE — Progress Notes (Signed)
Patient ID: Sandra Ellison, female   DOB: 1945-10-11, 70 y.o.   MRN: [id number]   Subjective:    Patient ID: Sandra Ellison, female    DOB: 06-10-1945, 70 y.o.   MRN: [id number]  HPI  Patient here for a scheduled follow up.  States she has been having problems with hemorrhoids.  She was seen in ER 05/07/16 for weakness and nausea with some diarrhea.  ER note reviewed.  Labs ok.  Kidney function improved.  She reports that she felt a swollen area around her rectum.  She is concerned over hemorrhoid.  She states she cannot have a bowel movement secondary to hemorrhoid.  Physically was not allowing herself to haave a bowel movement.  Has tried preparation H and ice packs.  Also taking stool softener.  She also reports previous sore throat.  Rapid strep negative.  Placed on abx and mucinex.  Better.  Mainly concerned about the hemorrhoid.     Past Medical History:  Diagnosis Date  . Allergy   . Arthritis   . Carotid arterial disease ([place])    a. 02/2015 Carotid U/S: RICA A999333, LICA 123456.  . Chronic combined systolic and diastolic CHF (congestive heart failure) ([place])    a. 01/2012 Echo: EF 30-35%, mod conc LVH, sev inf HK, mildly dil LA, mild-mod MR, Trace TR, RVSP 40-45mmHg.    Marland Kitchen Chronic kidney disease (CKD), stage II (mild)   . COPD (chronic obstructive pulmonary disease) ([place])   . Dilated cardiomyopathy ([place])    a. 02/2011 MV: EF 27%, anteroseptal, inf, inferolateral scar, no ischemia.  Marland Kitchen GERD (gastroesophageal reflux disease)   . History of abnormal Pap smear    a. s/p vaginal hysterectomy  . History of diverticulosis   . Hx of colonic polyps   . Hyperkalemia    a. 02/2015 in setting of entresto/AKI->entresto d/c'd.  . Hypertension   . Iron deficiency anemia    a. h/o transfusions, prev followed by heme/onc.  . Pulmonary HTN   . TIA (transient ischemic attack)   . Tobacco abuse    Past Surgical History:  Procedure Laterality Date  . CARDIAC CATHETERIZATION Left 08/05/2015   Procedure: Left Heart Cath and Coronary Angiography;  Surgeon: Minna Merritts, MD;  Location: [facility];  Service: Cardiovascular;  Laterality: Left;  . COLONOSCOPY  10/2010  . ORIF DISTAL RADIUS FRACTURE  2011   right  . TONSILLECTOMY  age 3  . VAGINAL HYSTERECTOMY  1981   secondary to abnormal pap smear   Family History  Problem Relation Age of Onset  . Arthritis Mother   . Alcohol abuse Father   . Arthritis Father   . Cancer Father     throat/spine  . Breast cancer Neg Hx    Social History   Social History  . Marital status: Divorced    Spouse name: N/A  . Number of children: 1  . Years of education: N/A   Social History Main Topics  . Smoking status: Current Every Day Smoker    Packs/day: 0.50    Years: 42.00    Types: Cigarettes  . Smokeless tobacco: Never Used     Comment: Down to 3 cigarettes per day  . Alcohol use No  . Drug use: No  . Sexual activity: Not Asked   Other Topics Concern  . None   Social History Narrative  . None    Outpatient Encounter Prescriptions as of 05/27/2016  Medication Sig  . albuterol (PROVENTIL) (2.5  MG/3ML) 0.083% nebulizer solution Take 3 mLs by nebulization every 4 (four) hours as needed. For wheezing/shortness of breath.  Marland Kitchen albuterol (PROVENTIL) (2.5 MG/3ML) 0.083% nebulizer solution USE ONE TREATMENT EVERY 6 HOURS AS NEEDED  . atorvastatin (LIPITOR) 40 MG tablet TAKE 1 TABLET (40 MG TOTAL) BY MOUTH DAILY.  . carvedilol (COREG) 6.25 MG tablet Take 6.25 mg by mouth 2 (two) times daily with a meal.  . citalopram (CELEXA) 10 MG tablet TAKE 1 TABLET (10 MG TOTAL) BY MOUTH DAILY.  Marland Kitchen clopidogrel (PLAVIX) 75 MG tablet TAKE 1 TABLET (75 MG TOTAL) BY MOUTH DAILY.  Marland Kitchen Fe Fum-FePoly-Vit C-Vit B3 (INTEGRA) 62.5-62.5-40-3 MG CAPS One per day (Patient taking differently: Take 1 capsule by mouth daily. )  . furosemide (LASIX) 20 MG tablet TAKE 1 TABLET (20 MG TOTAL) BY MOUTH DAILY AS NEEDED.  Marland Kitchen gabapentin (NEURONTIN) 100 MG  capsule TAKE 1 CAPSULE (100 MG TOTAL) BY MOUTH 3 (THREE) TIMES DAILY AS NEEDED.  Marland Kitchen LORazepam (ATIVAN) 0.5 MG tablet Take 1 tablet (0.5 mg total) by mouth every 8 (eight) hours as needed for anxiety.  . meclizine (ANTIVERT) 12.5 MG tablet Take 1 tablet (12.5 mg total) by mouth 3 (three) times daily as needed for dizziness.  . ondansetron (ZOFRAN ODT) 4 MG disintegrating tablet Take 1 tablet (4 mg total) by mouth every 8 (eight) hours as needed for nausea or vomiting.  . pantoprazole (PROTONIX) 40 MG tablet TAKE 1 TABLET (40 MG TOTAL) BY MOUTH DAILY.  Marland Kitchen tiotropium (SPIRIVA HANDIHALER) 18 MCG inhalation capsule One inhalation daily (Patient taking differently: Place 18 mcg into inhaler and inhale daily. )  . VENTOLIN HFA 108 (90 Base) MCG/ACT inhaler INHALE 2 PUFFS EVERY 6 HOURS AS NEEDED FOR WHEEZING OR SHORTNESS OF BREATH  . hydrocortisone (ANUSOL-HC) 25 MG suppository Place 1 suppository (25 mg total) rectally 2 (two) times daily.   No facility-administered encounter medications on file as of 05/27/2016.     Review of Systems  Constitutional: Negative for appetite change and unexpected weight change.  HENT: Negative for congestion and sinus pressure.   Respiratory: Negative for cough, chest tightness and shortness of breath.   Cardiovascular: Negative for chest pain, palpitations and leg swelling.  Gastrointestinal: Negative for abdominal pain, diarrhea, nausea and vomiting.       Physically not allowing herself to have a bowel movement.    Genitourinary: Negative for difficulty urinating and dysuria.  Musculoskeletal: Negative for joint swelling and myalgias.  Skin: Negative for color change and rash.  Neurological: Negative for dizziness and headaches.  Psychiatric/Behavioral: Negative for agitation and dysphoric mood.       Objective:    Physical Exam  Constitutional: She appears well-developed and well-nourished. No distress.  HENT:  Nose: Nose normal.  Mouth/Throat: Oropharynx  is clear and moist.  Neck: Neck supple. No thyromegaly present.  Cardiovascular: Normal rate and regular rhythm.   Pulmonary/Chest: Breath sounds normal. No respiratory distress. She has no wheezes.  Abdominal: Soft. Bowel sounds are normal. There is no tenderness.  Genitourinary:  Genitourinary Comments: Rectal exam - soft stool in the rectum.  No blood present.  No external hemorrhoid.    Musculoskeletal: She exhibits no edema or tenderness.  Lymphadenopathy:    She has no cervical adenopathy.  Skin: No rash noted. No erythema.  Psychiatric: She has a normal mood and affect. Her behavior is normal.    BP 122/76   Pulse 73   Temp 97.6 F (36.4 C) (Oral)   Ht 5'  5" (1.651 m)   Wt 139 lb 9.6 oz (63.3 kg)   SpO2 97%   BMI 23.23 kg/m  Wt Readings from Last 3 Encounters:  05/27/16 139 lb 9.6 oz (63.3 kg)  05/07/16 133 lb (60.3 kg)  02/25/16 133 lb 3.2 oz (60.4 kg)     Lab Results  Component Value Date   WBC 8.3 05/07/2016   HGB 14.9 05/07/2016   HCT 45.3 05/07/2016   PLT 195 05/07/2016   GLUCOSE 96 05/27/2016   CHOL 91 03/26/2016   TRIG 105.0 03/26/2016   HDL 30.60 (L) 03/26/2016   LDLCALC 39 03/26/2016   ALT 15 03/26/2016   AST 18 03/26/2016   NA 136 05/27/2016   K 4.5 05/27/2016   CL 97 05/27/2016   CREATININE 1.43 (H) 05/27/2016   BUN 27 (H) 05/27/2016   CO2 29 05/27/2016   TSH 2.67 08/29/2015   INR 1.10 11/24/2015   HGBA1C 5.0 03/26/2016       Assessment & Plan:   Problem List Items Addressed This Visit    Anxiety    On citalopram.  Has been doing better.  Follow.       Carotid arterial disease ([place])    Followed by cardiology.       CKD (chronic kidney disease), stage IV (HCC)    Last creatinine improved.  Off lisinopril.  Follow.        COPD (chronic obstructive pulmonary disease) (HCC)    Breathing stable.  Continue current inhaler regimen.        Essential hypertension    Blood pressure under good control.  Continue same medication  regimen.  Follow pressures.  Follow metabolic panel.        GERD (gastroesophageal reflux disease)    On protonix.  Stable.       Hyperlipidemia    On lipitor.  Follow lipid panel and liver function tests.        Nonischemic cardiomyopathy ([place])    Followed by cardiology.  Breathing better.  No increased sob.  Follow.       Rectal irritation    No hemorrhoid noted on exam.  annusol HC suppositories not covered by her insurance.  Analpram.  Follow.  Needs to allow herself to have a bowel movement.  No significant pain on exam.  Follow.         Other Visit Diagnoses    Aching pain    -  Primary   Relevant Orders   Basic metabolic panel (Completed)   Sedimentation rate (Completed)   CK (Creatine Kinase) (Completed)       Einar Pheasant, MD

## 2016-05-27 NOTE — Telephone Encounter (Signed)
Due to cost pt has request the suppository Rx be rewritten for a cheaper version  Pharmacy [facility]

## 2016-05-27 NOTE — Progress Notes (Signed)
Pre visit review using our clinic review tool, if applicable. No additional management support is needed unless otherwise documented below in the visit note. 

## 2016-05-28 ENCOUNTER — Telehealth: Payer: Self-pay | Admitting: Internal Medicine

## 2016-05-28 NOTE — Telephone Encounter (Signed)
Please contact pharmacy and see if they have an alternative for pt that is not as expensive.  If no, then can just try analpram cream - apply bid (one tube) no refills.

## 2016-05-28 NOTE — Telephone Encounter (Signed)
Notified patient.

## 2016-05-28 NOTE — Telephone Encounter (Signed)
LM for patient to return call. Pharmacy does not have anything cheaper so gave verbal order for the analpram.

## 2016-05-28 NOTE — Telephone Encounter (Signed)
Pt called back returning your call . Thank you!  Call pt @ [phone]

## 2016-05-28 NOTE — Telephone Encounter (Signed)
Pt called and left vm stating that she was unable to get suppositories because her insurance will not cover them, and she cannot afford them, they are $160 without insurance. Please advise, thank you!

## 2016-05-28 NOTE — Telephone Encounter (Signed)
Is there something else that can be sent in

## 2016-05-28 NOTE — Telephone Encounter (Signed)
Please advise 

## 2016-05-28 NOTE — Telephone Encounter (Signed)
See attached

## 2016-05-29 ENCOUNTER — Other Ambulatory Visit: Payer: Self-pay | Admitting: Internal Medicine

## 2016-05-29 DIAGNOSIS — N183 Chronic kidney disease, stage 3 unspecified: Secondary | ICD-10-CM

## 2016-05-29 NOTE — Progress Notes (Signed)
Orders placed for nephrology referral.  

## 2016-06-02 ENCOUNTER — Encounter: Payer: Self-pay | Admitting: Internal Medicine

## 2016-06-02 DIAGNOSIS — K6289 Other specified diseases of anus and rectum: Secondary | ICD-10-CM | POA: Insufficient documentation

## 2016-06-02 NOTE — Assessment & Plan Note (Signed)
No hemorrhoid noted on exam.  annusol HC suppositories not covered by her insurance.  Analpram.  Follow.  Needs to allow herself to have a bowel movement.  No significant pain on exam.  Follow.

## 2016-06-02 NOTE — Assessment & Plan Note (Signed)
Last creatinine improved.  Off lisinopril.  Follow.

## 2016-06-02 NOTE — Assessment & Plan Note (Signed)
On lipitor.  Follow lipid panel and liver function tests.   

## 2016-06-02 NOTE — Assessment & Plan Note (Signed)
On citalopram.  Has been doing better.  Follow.

## 2016-06-02 NOTE — Assessment & Plan Note (Signed)
Breathing stable.  Continue current inhaler regimen.   

## 2016-06-02 NOTE — Assessment & Plan Note (Signed)
Blood pressure under good control.  Continue same medication regimen.  Follow pressures.  Follow metabolic panel.   

## 2016-06-02 NOTE — Assessment & Plan Note (Signed)
On protonix.  Stable.  

## 2016-06-02 NOTE — Assessment & Plan Note (Signed)
Followed by cardiology.  Breathing better.  No increased sob.  Follow.

## 2016-06-02 NOTE — Assessment & Plan Note (Signed)
Followed by cardiology 

## 2016-06-09 ENCOUNTER — Other Ambulatory Visit: Payer: Self-pay | Admitting: Internal Medicine

## 2016-06-09 MED ORDER — ONDANSETRON 4 MG PO TBDP
4.0000 mg | ORAL_TABLET | Freq: Three times a day (TID) | ORAL | 0 refills | Status: DC | PRN
Start: 2016-06-09 — End: 2016-07-03

## 2016-06-09 MED ORDER — ONDANSETRON 4 MG PO TBDP: 4.0000 mg | ORAL_TABLET | Freq: Three times a day (TID) | ORAL | 0 refills | Status: DC | PRN

## 2016-06-09 NOTE — Telephone Encounter (Signed)
Pt called requesting a refill on ondansetron (ZOFRAN ODT) 4 MG disintegrating tablet. Please advise, thank you!  [facility]acy [id number] [address], [address]  Call pt @ [phone]

## 2016-06-09 NOTE — Telephone Encounter (Signed)
Sent rx.

## 2016-06-11 ENCOUNTER — Other Ambulatory Visit: Payer: Self-pay | Admitting: Internal Medicine

## 2016-06-25 ENCOUNTER — Other Ambulatory Visit: Payer: Self-pay | Admitting: Nurse Practitioner

## 2016-06-25 ENCOUNTER — Ambulatory Visit: Payer: Self-pay | Admitting: Internal Medicine

## 2016-07-03 ENCOUNTER — Ambulatory Visit
Admission: RE | Admit: 2016-07-03 | Discharge: 2016-07-03 | Disposition: A | Discharge status: Home / Self Care | Payer: Commercial Managed Care - HMO | Source: Ambulatory Visit | Attending: Internal Medicine | Admitting: Internal Medicine

## 2016-07-03 ENCOUNTER — Encounter: Payer: Self-pay | Admitting: Internal Medicine

## 2016-07-03 ENCOUNTER — Ambulatory Visit (INDEPENDENT_AMBULATORY_CARE_PROVIDER_SITE_OTHER): Payer: Commercial Managed Care - HMO | Admitting: Internal Medicine

## 2016-07-03 VITALS — BP 110/78 | HR 74 | Temp 98.6°F | Ht 65.0 in | Wt 134.6 lb

## 2016-07-03 DIAGNOSIS — R911 Solitary pulmonary nodule: Secondary | ICD-10-CM

## 2016-07-03 DIAGNOSIS — R21 Rash and other nonspecific skin eruption: Secondary | ICD-10-CM

## 2016-07-03 DIAGNOSIS — F419 Anxiety disorder, unspecified: Secondary | ICD-10-CM

## 2016-07-03 DIAGNOSIS — R920 Mammographic microcalcification found on diagnostic imaging of breast: Secondary | ICD-10-CM

## 2016-07-03 DIAGNOSIS — N183 Chronic kidney disease, stage 3 unspecified: Secondary | ICD-10-CM

## 2016-07-03 DIAGNOSIS — E785 Hyperlipidemia, unspecified: Secondary | ICD-10-CM

## 2016-07-03 DIAGNOSIS — J449 Chronic obstructive pulmonary disease, unspecified: Secondary | ICD-10-CM

## 2016-07-03 DIAGNOSIS — Z720 Tobacco use: Secondary | ICD-10-CM

## 2016-07-03 DIAGNOSIS — I25111 Atherosclerotic heart disease of native coronary artery with angina pectoris with documented spasm: Secondary | ICD-10-CM

## 2016-07-03 DIAGNOSIS — K219 Gastro-esophageal reflux disease without esophagitis: Secondary | ICD-10-CM

## 2016-07-03 DIAGNOSIS — M7989 Other specified soft tissue disorders: Secondary | ICD-10-CM | POA: Insufficient documentation

## 2016-07-03 DIAGNOSIS — D649 Anemia, unspecified: Secondary | ICD-10-CM

## 2016-07-03 DIAGNOSIS — M79604 Pain in right leg: Secondary | ICD-10-CM | POA: Diagnosis not present

## 2016-07-03 DIAGNOSIS — I428 Other cardiomyopathies: Secondary | ICD-10-CM

## 2016-07-03 DIAGNOSIS — I5022 Chronic systolic (congestive) heart failure: Secondary | ICD-10-CM

## 2016-07-03 DIAGNOSIS — I779 Disorder of arteries and arterioles, unspecified: Secondary | ICD-10-CM

## 2016-07-03 DIAGNOSIS — R600 Localized edema: Secondary | ICD-10-CM | POA: Diagnosis not present

## 2016-07-03 DIAGNOSIS — I10 Essential (primary) hypertension: Secondary | ICD-10-CM

## 2016-07-03 DIAGNOSIS — I420 Dilated cardiomyopathy: Secondary | ICD-10-CM

## 2016-07-03 DIAGNOSIS — I739 Peripheral vascular disease, unspecified: Secondary | ICD-10-CM

## 2016-07-03 LAB — CBC
HCT: 44.2 % (ref 35.0–45.0)
HEMOGLOBIN: 14.4 g/dL (ref 11.7–15.5)
MCH: 29.8 pg (ref 27.0–33.0)
MCHC: 32.6 g/dL (ref 32.0–36.0)
MCV: 91.5 fL (ref 80.0–100.0)
MPV: 9.2 fL (ref 7.5–12.5)
Platelets: 204 10*3/uL (ref 140–400)
RBC: 4.83 MIL/uL (ref 3.80–5.10)
RDW: 15.1 % — ABNORMAL HIGH (ref 11.0–15.0)
WBC: 4.9 10*3/uL (ref 3.8–10.8)

## 2016-07-03 MED ORDER — ONDANSETRON 4 MG PO TBDP: 4.0000 mg | ORAL_TABLET | Freq: Two times a day (BID) | ORAL | 0 refills | Status: DC | PRN

## 2016-07-03 MED ORDER — GABAPENTIN 300 MG PO CAPS: 300.0000 mg | ORAL_CAPSULE | Freq: Every day | ORAL | 3 refills | Status: DC

## 2016-07-03 NOTE — Progress Notes (Signed)
Pre visit review using our clinic review tool, if applicable. No additional management support is needed unless otherwise documented below in the visit note. 

## 2016-07-03 NOTE — Progress Notes (Addendum)
Patient ID: Sandra Ellison, female   DOB: 23-Jul-1945, 71 y.o.   MRN: [id number]   Subjective:    Patient ID: Sandra Ellison, female    DOB: 1946/01/27, 71 y.o.   MRN: [id number]  HPI  Patient here for a scheduled follow up.  She is accompanied by her boyfriend.  History obtained from both of them.  She reports that the pain she was experiencing when having a bowel movement has resolved.  Still some constipation.  Having bm's, but reports are hard.  Discussed treatment.  Breathing stable. Has noticed some swelling in her ankles.  Takes lasix 3 days per week.  Has noticed some "red spots" on her lower extremities.  No itching.  Does not involve other areas of her body.  She also reports increased burning in her feet and lower legs.  On gabapentin.  Request to change to 300mg  q hs.  Some drainage.  Some itching in her nose.  Wanted to discuss referral to nephrology.   Breathing overall stable.  No chest pain.  Sees cardiology.    Past Medical History:  Diagnosis Date  . Allergy   . Arthritis   . Carotid arterial disease ([place])    a. 02/2015 Carotid U/S: RICA A999333, LICA 123456.  . Chronic combined systolic and diastolic CHF (congestive heart failure) ([place])    a. 01/2012 Echo: EF 30-35%, mod conc LVH, sev inf HK, mildly dil LA, mild-mod MR, Trace TR, RVSP 40-51mmHg.    Marland Kitchen Chronic kidney disease (CKD), stage II (mild)   . COPD (chronic obstructive pulmonary disease) ([place])   . Dilated cardiomyopathy ([place])    a. 02/2011 MV: EF 27%, anteroseptal, inf, inferolateral scar, no ischemia.  Marland Kitchen GERD (gastroesophageal reflux disease)   . History of abnormal Pap smear    a. s/p vaginal hysterectomy  . History of diverticulosis   . Hx of colonic polyps   . Hyperkalemia    a. 02/2015 in setting of entresto/AKI->entresto d/c'd.  . Hypertension   . Iron deficiency anemia    a. h/o transfusions, prev followed by heme/onc.  . Pulmonary HTN   . TIA (transient ischemic attack)   . Tobacco abuse    Past  Surgical History:  Procedure Laterality Date  . CARDIAC CATHETERIZATION Left 08/05/2015   Procedure: Left Heart Cath and Coronary Angiography;  Surgeon: Minna Merritts, MD;  Location: [facility];  Service: Cardiovascular;  Laterality: Left;  . COLONOSCOPY  10/2010  . ORIF DISTAL RADIUS FRACTURE  2011   right  . TONSILLECTOMY  age 36  . VAGINAL HYSTERECTOMY  1981   secondary to abnormal pap smear   Family History  Problem Relation Age of Onset  . Arthritis Mother   . Alcohol abuse Father   . Arthritis Father   . Cancer Father     throat/spine  . Breast cancer Neg Hx    Social History   Social History  . Marital status: Divorced    Spouse name: N/A  . Number of children: 1  . Years of education: N/A   Social History Main Topics  . Smoking status: Current Every Day Smoker    Packs/day: 0.50    Years: 42.00    Types: Cigarettes  . Smokeless tobacco: Never Used     Comment: Down to 3 cigarettes per day  . Alcohol use No  . Drug use: No  . Sexual activity: Not Asked   Other Topics Concern  . None   Social History  Narrative  . None    Outpatient Encounter Prescriptions as of 07/03/2016  Medication Sig  . albuterol (PROVENTIL) (2.5 MG/3ML) 0.083% nebulizer solution Take 3 mLs by nebulization every 4 (four) hours as needed. For wheezing/shortness of breath.  Marland Kitchen albuterol (PROVENTIL) (2.5 MG/3ML) 0.083% nebulizer solution USE ONE TREATMENT EVERY 6 HOURS AS NEEDED  . atorvastatin (LIPITOR) 40 MG tablet TAKE 1 TABLET (40 MG TOTAL) BY MOUTH DAILY.  Marland Kitchen atorvastatin (LIPITOR) 40 MG tablet TAKE 1 TABLET (40 MG TOTAL) BY MOUTH DAILY.  . carvedilol (COREG) 6.25 MG tablet Take 6.25 mg by mouth 2 (two) times daily with a meal.  . citalopram (CELEXA) 10 MG tablet TAKE 1 TABLET (10 MG TOTAL) BY MOUTH DAILY.  Marland Kitchen clopidogrel (PLAVIX) 75 MG tablet TAKE 1 TABLET (75 MG TOTAL) BY MOUTH DAILY.  Marland Kitchen Fe Fum-FePoly-Vit C-Vit B3 (INTEGRA) 62.5-62.5-40-3 MG CAPS One per day (Patient taking  differently: Take 1 capsule by mouth daily. )  . furosemide (LASIX) 20 MG tablet TAKE 1 TABLET (20 MG TOTAL) BY MOUTH DAILY AS NEEDED.  . hydrocortisone (ANUSOL-HC) 25 MG suppository Place 1 suppository (25 mg total) rectally 2 (two) times daily.  Marland Kitchen LORazepam (ATIVAN) 0.5 MG tablet Take 1 tablet (0.5 mg total) by mouth every 8 (eight) hours as needed for anxiety.  . ondansetron (ZOFRAN ODT) 4 MG disintegrating tablet Take 1 tablet (4 mg total) by mouth 2 (two) times daily as needed for nausea or vomiting.  . pantoprazole (PROTONIX) 40 MG tablet TAKE 1 TABLET (40 MG TOTAL) BY MOUTH DAILY.  Marland Kitchen tiotropium (SPIRIVA HANDIHALER) 18 MCG inhalation capsule One inhalation daily (Patient taking differently: Place 18 mcg into inhaler and inhale daily. )  . VENTOLIN HFA 108 (90 Base) MCG/ACT inhaler INHALE 2 PUFFS EVERY 6 HOURS AS NEEDED FOR WHEEZING OR SHORTNESS OF BREATH  . [DISCONTINUED] gabapentin (NEURONTIN) 100 MG capsule TAKE 1 CAPSULE (100 MG TOTAL) BY MOUTH 3 (THREE) TIMES DAILY AS NEEDED.  . [DISCONTINUED] ondansetron (ZOFRAN ODT) 4 MG disintegrating tablet Take 1 tablet (4 mg total) by mouth every 8 (eight) hours as needed for nausea or vomiting.  . gabapentin (NEURONTIN) 300 MG capsule Take 1 capsule (300 mg total) by mouth at bedtime.  . [DISCONTINUED] meclizine (ANTIVERT) 12.5 MG tablet Take 1 tablet (12.5 mg total) by mouth 3 (three) times daily as needed for dizziness.   No facility-administered encounter medications on file as of 07/03/2016.     Review of Systems  Constitutional: Negative for appetite change and unexpected weight change.  HENT: Negative for congestion and sinus pressure.        Itchy nose.  Drippy nose.   Respiratory: Negative for cough and chest tightness.        Breathing stable.   Cardiovascular: Positive for leg swelling. Negative for chest pain and palpitations.  Gastrointestinal: Negative for abdominal pain, diarrhea, nausea and vomiting.  Genitourinary: Negative for  difficulty urinating and dysuria.  Musculoskeletal: Negative for back pain and myalgias.  Skin: Positive for rash. Negative for color change.  Neurological: Negative for dizziness, light-headedness and headaches.  Psychiatric/Behavioral: Negative for agitation and dysphoric mood.       Objective:    Physical Exam  Constitutional: She appears well-developed and well-nourished. No distress.  HENT:  Nose: Nose normal.  Mouth/Throat: Oropharynx is clear and moist.  Neck: Neck supple. No thyromegaly present.  Cardiovascular: Normal rate and regular rhythm.   Pulmonary/Chest: Breath sounds normal. No respiratory distress. She has no wheezes.  Abdominal: Soft. Bowel  sounds are normal. There is no tenderness.  Musculoskeletal: She exhibits no tenderness.  Pedal and lower extremity edema.    Lymphadenopathy:    She has no cervical adenopathy.  Skin: Rash noted. No erythema.  Psychiatric: She has a normal mood and affect. Her behavior is normal.    BP 110/78 (BP Location: Right Arm, Patient Position: Sitting, Cuff Size: Large)   Pulse 74   Temp 98.6 F (37 C) (Oral)   Ht 5\' 5"  (1.651 m)   Wt 134 lb 9.6 oz (61.1 kg)   SpO2 93%   BMI 22.40 kg/m  Wt Readings from Last 3 Encounters:  07/03/16 134 lb 9.6 oz (61.1 kg)  05/27/16 139 lb 9.6 oz (63.3 kg)  05/07/16 133 lb (60.3 kg)     Lab Results  Component Value Date   WBC 4.9 07/03/2016   HGB 14.4 07/03/2016   HCT 44.2 07/03/2016   PLT 204 07/03/2016   GLUCOSE 99 07/03/2016   CHOL 91 03/26/2016   TRIG 105.0 03/26/2016   HDL 30.60 (L) 03/26/2016   LDLCALC 39 03/26/2016   ALT 15 03/26/2016   AST 18 03/26/2016   NA 137 07/03/2016   K 5.1 07/03/2016   CL 102 07/03/2016   CREATININE 1.38 (H) 07/03/2016   BUN 27 (H) 07/03/2016   CO2 25 07/03/2016   TSH 2.67 08/29/2015   INR 1.10 11/24/2015   HGBA1C 5.0 03/26/2016       Assessment & Plan:   Problem List Items Addressed This Visit    Anemia    Has a history of anemia  which was felt to be related to iron deficiency and chronic renal disease.  Recheck cbc today.        Anxiety    On citalopram.  Stable.        CAD (coronary artery disease)    Followed by cardiology.  Continue risk factor modification.       Carotid arterial disease ([place])    Has been evaluated by cardiology.  Discussed the need for f/u.       Chronic systolic CHF (congestive heart failure) (HCC)    Has lasix.  Takes three times per week.  Lower extremity edema as outlined.  Leg elevation.  Recheck metabolic panel.        CKD (chronic kidney disease), stage III    Discussed avoiding antiinflammatories.  Discussed referral to nephrology  She is agreeable.        Relevant Orders   Ambulatory referral to Nephrology   COPD (chronic obstructive pulmonary disease) ([place])    Breathing stable.        Dilated cardiomyopathy ([place])    Followed by cardiology.  Stable.        Essential hypertension    Blood pressure under good control.  Continue same medication regimen.  Follow pressures.  Follow metabolic panel.        GERD (gastroesophageal reflux disease)    Stable on protonix.       Relevant Medications   ondansetron (ZOFRAN ODT) 4 MG disintegrating tablet   Hyperlipidemia    On lipitor.  Follow lipid panel and liver function tests.        Microcalcifications of the breast    Was evaluated by Dr Bary Castilla.  Overdue f/u mammogram.  Apparently missed last appt.       Nonischemic cardiomyopathy ([place])    Followed by cardiology.  Breathing stable.        Pulmonary nodule  Had f/u CT that revealed stable pulmonary nodule from 2014.  Felt to be benign since stable.  See radiology report.        Tobacco abuse    Have discussed the need to quit smoking.         Other Visit Diagnoses    Swelling of right lower extremity    -  Primary   right leg worse than left. leg elevation.  check lower extremity ultrasound.     Relevant Orders   US Venous Img Lower Unilateral Right  (Completed)   Basic Metabolic Panel (BMET) (Completed)   CBC (Completed)   CKD (chronic kidney disease) stage 3, GFR 30-59 ml/min       Relevant Orders   Basic Metabolic Panel (BMET) (Completed)   CBC (Completed)   Rash       rash as outlined.  appears to be c/w compression.  leg elevation.  no itching.  follow.        Einar Pheasant, MD

## 2016-07-04 ENCOUNTER — Encounter: Payer: Self-pay | Admitting: Internal Medicine

## 2016-07-04 LAB — BASIC METABOLIC PANEL
BUN: 27 mg/dL — AB (ref 7–25)
CALCIUM: 8.8 mg/dL (ref 8.6–10.4)
CO2: 25 mmol/L (ref 20–31)
CREATININE: 1.38 mg/dL — AB (ref 0.60–0.93)
Chloride: 102 mmol/L (ref 98–110)
GLUCOSE: 99 mg/dL (ref 65–99)
Potassium: 5.1 mmol/L (ref 3.5–5.3)
SODIUM: 137 mmol/L (ref 135–146)

## 2016-07-04 NOTE — Assessment & Plan Note (Signed)
Followed by cardiology.  Breathing stable.   

## 2016-07-04 NOTE — Assessment & Plan Note (Signed)
Discussed avoiding antiinflammatories.  Discussed referral to nephrology  She is agreeable.

## 2016-07-04 NOTE — Assessment & Plan Note (Signed)
Followed by cardiology. Stable.   

## 2016-07-04 NOTE — Assessment & Plan Note (Signed)
Was evaluated by Dr Bary Castilla.  Overdue f/u mammogram.  Apparently missed last appt.

## 2016-07-04 NOTE — Assessment & Plan Note (Signed)
Followed by cardiology.  Continue risk factor modification.   

## 2016-07-04 NOTE — Assessment & Plan Note (Signed)
Blood pressure under good control.  Continue same medication regimen.  Follow pressures.  Follow metabolic panel.   

## 2016-07-04 NOTE — Assessment & Plan Note (Signed)
Stable on protonix

## 2016-07-04 NOTE — Assessment & Plan Note (Signed)
Breathing stable.

## 2016-07-04 NOTE — Assessment & Plan Note (Signed)
On citalopram.  Stable.   

## 2016-07-04 NOTE — Assessment & Plan Note (Signed)
On lipitor.  Follow lipid panel and liver function tests.   

## 2016-07-04 NOTE — Assessment & Plan Note (Signed)
Has been evaluated by cardiology.  Discussed the need for f/u.

## 2016-07-04 NOTE — Assessment & Plan Note (Signed)
Had f/u CT that revealed stable pulmonary nodule from 2014.  Felt to be benign since stable.  See radiology report.

## 2016-07-04 NOTE — Assessment & Plan Note (Signed)
Has lasix.  Takes three times per week.  Lower extremity edema as outlined.  Leg elevation.  Recheck metabolic panel.

## 2016-07-04 NOTE — Assessment & Plan Note (Signed)
Has a history of anemia which was felt to be related to iron deficiency and chronic renal disease.  Recheck cbc today.

## 2016-07-04 NOTE — Assessment & Plan Note (Signed)
Have discussed the need to quit smoking.   

## 2016-07-05 NOTE — Addendum Note (Signed)
Addended by: Alisa Graff on: 07/05/2016 08:07 AM   Modules accepted: Orders

## 2016-07-06 ENCOUNTER — Encounter: Payer: Self-pay | Admitting: *Deleted

## 2016-07-22 ENCOUNTER — Other Ambulatory Visit: Payer: Self-pay | Admitting: Cardiovascular Disease

## 2016-07-22 ENCOUNTER — Other Ambulatory Visit: Payer: Self-pay | Admitting: Internal Medicine

## 2016-07-29 ENCOUNTER — Other Ambulatory Visit: Payer: Self-pay

## 2016-07-29 ENCOUNTER — Other Ambulatory Visit: Payer: Self-pay | Admitting: Cardiovascular Disease

## 2016-07-29 NOTE — Telephone Encounter (Signed)
Pt has appointment on 08/06/16 to see Dr Candis Musa

## 2016-07-29 NOTE — Telephone Encounter (Signed)
Spoke with Larkin Ina [facility] told not to refill the Lisinopril 10 mg that was sent in today, per Dr. Randell Patient Scott's visit from 02/25/2016   Last creatinine 1.39.  Off lisinopril.  Doing better.  Stay hydrated.  Follow.     Patient was contacted and verified that she is off the Lisinopril.  The Lisinopril was never discontinued from med list, please review for further instructions on medications.  The patient will follow up on 08/06/2016 with Dr. Rockey Situ.

## 2016-07-29 NOTE — Telephone Encounter (Signed)
Review.

## 2016-07-29 NOTE — Telephone Encounter (Signed)
Pt needs a follow up appt with Gollan. Thanks!

## 2016-07-30 ENCOUNTER — Ambulatory Visit: Payer: Self-pay

## 2016-07-31 ENCOUNTER — Encounter: Payer: Self-pay | Admitting: Family Medicine

## 2016-07-31 ENCOUNTER — Ambulatory Visit (INDEPENDENT_AMBULATORY_CARE_PROVIDER_SITE_OTHER): Payer: Commercial Managed Care - HMO

## 2016-07-31 ENCOUNTER — Ambulatory Visit (INDEPENDENT_AMBULATORY_CARE_PROVIDER_SITE_OTHER): Payer: Medicare HMO

## 2016-07-31 ENCOUNTER — Other Ambulatory Visit: Payer: Self-pay | Admitting: Family Medicine

## 2016-07-31 ENCOUNTER — Ambulatory Visit (INDEPENDENT_AMBULATORY_CARE_PROVIDER_SITE_OTHER): Payer: Medicare HMO | Admitting: Family Medicine

## 2016-07-31 VITALS — BP 110/78 | HR 72 | Temp 97.7°F | Resp 14 | Ht 65.0 in | Wt 135.4 lb

## 2016-07-31 VITALS — BP 110/78 | HR 72 | Temp 97.7°F | Ht 65.0 in | Wt 135.4 lb

## 2016-07-31 DIAGNOSIS — Z0000 Encounter for general adult medical examination without abnormal findings: Secondary | ICD-10-CM | POA: Diagnosis not present

## 2016-07-31 DIAGNOSIS — S4401XS Injury of ulnar nerve at upper arm level, right arm, sequela: Secondary | ICD-10-CM

## 2016-07-31 DIAGNOSIS — M542 Cervicalgia: Secondary | ICD-10-CM

## 2016-07-31 DIAGNOSIS — M47812 Spondylosis without myelopathy or radiculopathy, cervical region: Secondary | ICD-10-CM | POA: Diagnosis not present

## 2016-07-31 DIAGNOSIS — M5030 Other cervical disc degeneration, unspecified cervical region: Secondary | ICD-10-CM

## 2016-07-31 NOTE — Progress Notes (Signed)
Subjective:   Sandra Ellison is a 71 y.o. female who presents for an Initial Medicare Annual Wellness Visit.  Review of Systems    No ROS.  Medicare Wellness Visit.  Cardiac Risk Factors include: advanced age (>74men, >96 women);hypertension     Objective:    Today's Vitals   07/31/16 1008 07/31/16 1019  BP: 110/78 110/78  Pulse: 72 72  Resp: 14 14  Temp: 97.9 F (36.6 C) 97.7 F (36.5 C)  TempSrc: Oral Oral  SpO2: 98% 98%  Weight: 135 lb 6.4 oz (61.4 kg) 135 lb 6.4 oz (61.4 kg)  Height: 5\' 5"  (1.651 m) 5\' 5"  (1.651 m)   Body mass index is 22.53 kg/m.   Current Medications (verified) Outpatient Encounter Prescriptions as of 07/31/2016  Medication Sig  . albuterol (PROVENTIL) (2.5 MG/3ML) 0.083% nebulizer solution USE ONE TREATMENT EVERY 6 HOURS AS NEEDED  . aspirin EC 81 MG tablet Take 81 mg by mouth daily.  Marland Kitchen atorvastatin (LIPITOR) 40 MG tablet TAKE 1 TABLET (40 MG TOTAL) BY MOUTH DAILY.  . carvedilol (COREG) 6.25 MG tablet Take 6.25 mg by mouth 2 (two) times daily with a meal.  . citalopram (CELEXA) 10 MG tablet TAKE 1 TABLET (10 MG TOTAL) BY MOUTH DAILY.  Marland Kitchen clopidogrel (PLAVIX) 75 MG tablet TAKE 1 TABLET (75 MG TOTAL) BY MOUTH DAILY.  Marland Kitchen Fe Fum-FePoly-Vit C-Vit B3 (INTEGRA) 62.5-62.5-40-3 MG CAPS One per day (Patient taking differently: Take 1 capsule by mouth daily. )  . furosemide (LASIX) 20 MG tablet TAKE 1 TABLET (20 MG TOTAL) BY MOUTH DAILY AS NEEDED.  Marland Kitchen gabapentin (NEURONTIN) 300 MG capsule Take 1 capsule (300 mg total) by mouth at bedtime.  Marland Kitchen LORazepam (ATIVAN) 0.5 MG tablet Take 1 tablet (0.5 mg total) by mouth every 8 (eight) hours as needed for anxiety.  . ondansetron (ZOFRAN ODT) 4 MG disintegrating tablet Take 1 tablet (4 mg total) by mouth 2 (two) times daily as needed for nausea or vomiting.  . pantoprazole (PROTONIX) 40 MG tablet TAKE 1 TABLET (40 MG TOTAL) BY MOUTH DAILY.  Marland Kitchen SPIRIVA HANDIHALER 18 MCG inhalation capsule INHALE ONCE DAILY  . VENTOLIN  HFA 108 (90 Base) MCG/ACT inhaler INHALE 2 PUFFS EVERY 6 HOURS AS NEEDED FOR WHEEZING OR SHORTNESS OF BREATH  . [DISCONTINUED] albuterol (PROVENTIL) (2.5 MG/3ML) 0.083% nebulizer solution Take 3 mLs by nebulization every 4 (four) hours as needed. For wheezing/shortness of breath.  . [DISCONTINUED] atorvastatin (LIPITOR) 40 MG tablet TAKE 1 TABLET (40 MG TOTAL) BY MOUTH DAILY.  . [DISCONTINUED] carvedilol (COREG) 6.25 MG tablet TAKE 1 TABLET (6.25 MG TOTAL) BY MOUTH 2 (TWO) TIMES DAILY WITH A MEAL.  . [DISCONTINUED] hydrocortisone (ANUSOL-HC) 25 MG suppository Place 1 suppository (25 mg total) rectally 2 (two) times daily.  . [DISCONTINUED] lisinopril (PRINIVIL,ZESTRIL) 10 MG tablet TAKE 1 TABLET (10 MG TOTAL) BY MOUTH DAILY.   No facility-administered encounter medications on file as of 07/31/2016.     Allergies (verified) Ranitidine; Ranitidine hcl; and Entresto [sacubitril-valsartan]   History: Past Medical History:  Diagnosis Date  . Allergy   . Arthritis   . Carotid arterial disease ([place])    a. 02/2015 Carotid U/S: RICA [id number], LICA [id number].  . Chronic combined systolic and diastolic CHF (congestive heart failure) ([place])    a. 01/2012 Echo: EF 30-35%, mod conc LVH, sev inf HK, mildly dil LA, mild-mod MR, Trace TR, RVSP 40-38mmHg.    Marland Kitchen Chronic kidney disease (CKD), stage II (mild)   . COPD (  chronic obstructive pulmonary disease) ([place])   . Dilated cardiomyopathy ([place])    a. 02/2011 MV: EF 27%, anteroseptal, inf, inferolateral scar, no ischemia.  Marland Kitchen GERD (gastroesophageal reflux disease)   . History of abnormal Pap smear    a. s/p vaginal hysterectomy  . History of diverticulosis   . Hx of colonic polyps   . Hyperkalemia    a. 02/2015 in setting of entresto/AKI->entresto d/c'd.  . Hypertension   . Iron deficiency anemia    a. h/o transfusions, prev followed by heme/onc.  . Pulmonary HTN   . TIA (transient ischemic attack)   . Tobacco abuse    Past Surgical History:  Procedure  Laterality Date  . CARDIAC CATHETERIZATION Left 08/05/2015   Procedure: Left Heart Cath and Coronary Angiography;  Surgeon: Minna Merritts, MD;  Location: [facility];  Service: Cardiovascular;  Laterality: Left;  . COLONOSCOPY  10/2010  . ORIF DISTAL RADIUS FRACTURE  2011   right  . TONSILLECTOMY  age 52  . VAGINAL HYSTERECTOMY  1981   secondary to abnormal pap smear   Family History  Problem Relation Age of Onset  . Arthritis Mother   . Alcohol abuse Father   . Arthritis Father   . Cancer Father     throat/spine  . Breast cancer Neg Hx    Social History   Occupational History  . Not on file.   Social History Main Topics  . Smoking status: Current Every Day Smoker    Packs/day: 0.50    Years: 42.00    Types: Cigarettes  . Smokeless tobacco: Never Used     Comment: Down to 3 cigarettes per day  . Alcohol use No  . Drug use: No  . Sexual activity: No    Tobacco Counseling Ready to quit: Not Answered Counseling given: Not Answered   Activities of Daily Living In your present state of health, do you have any difficulty performing the following activities: 07/31/2016 11/24/2015  Hearing? N -  Vision? Y -  Difficulty concentrating or making decisions? Y -  Walking or climbing stairs? Y -  Dressing or bathing? N -  Doing errands, shopping? Y N  Preparing Food and eating ? N -  Using the Toilet? N -  In the past six months, have you accidently leaked urine? N -  Do you have problems with loss of bowel control? N -  Managing your Medications? N -  Managing your Finances? N -  Housekeeping or managing your Housekeeping? N -  Some recent data might be hidden    Immunizations and Health Maintenance Immunization History  Administered Date(s) Administered  . Influenza, High Dose Seasonal PF 02/25/2016  . Influenza,inj,Quad PF,36+ Mos 05/16/2013, 04/05/2014, 02/21/2015  . Pneumococcal Conjugate-13 07/17/2013   Health Maintenance Due  Topic Date Due  .  Hepatitis C Screening  May 23, 1946  . TETANUS/TDAP  05/08/1965  . DEXA SCAN  05/09/2011  . PNA vac Low Risk Adult (2 of 2 - PPSV23) 07/17/2014    Patient Care Team: Einar Pheasant, MD as PCP - General (Internal Medicine) Minna Merritts, MD as Consulting Physician (Cardiology) [name], MD (General Surgery) Einar Pheasant, MD (Internal Medicine)  Indicate any recent Medical Services you may have received from other than [facility] providers in the past year (date may be approximate).     Assessment:   This is a routine wellness examination for Estefanny. The goal of the wellness visit is to assist the patient how to  close the gaps in care and create a preventative care plan for the patient.   Osteoporosis risk reviewed.  Medications reviewed; taking without issues or barriers.  Safety issues reviewed; Life alert, smoke detectors in the home. No firearms in the home. Wears seatbelts when driving or riding with others. No violence in the home.  No identified risk were noted; The patient was oriented x 3; appropriate in dress and manner and no objective failures at ADL's or IADL's.   BMI; discussed the importance of a healthy diet, water intake and exercise. Educational material provided.  HTN; followed by PCP.  DEXA Scan discussed; deferred per patient preference.  Educational material provided.  Hepatitis C Screening discussed. Educational material provided.  Pneumovax 23 and TDAP vaccine deferred at this time per patient preference. Educational material provided.  Patient Concerns: None at this time. Follow up with PCP as needed.  Hearing/Vision screen Hearing Screening Comments: Passes the whisper test Vision Screening Comments: Followed by Dr. Ellin Mayhew Last OV 01/2016 Visual acuity not assessed per patient preference since she has regular follow up with her ophthalmologist.   Dietary issues and exercise activities discussed: Current Exercise Habits: Home  exercise routine, Type of exercise: walking, Time (Minutes): 10, Frequency (Times/Week): 3, Weekly Exercise (Minutes/Week): 30, Intensity: Mild  Goals    . Increase physical activity      Depression Screen PHQ 2/9 Scores 07/03/2016 02/25/2016 08/09/2014 05/16/2013  PHQ - 2 Score 0 0 0 0    Fall Risk Fall Risk  07/03/2016 02/25/2016 08/09/2014 05/16/2013  Falls in the past year? No No No No    Cognitive Function: MMSE - Mini Mental State Exam 07/31/2016  Orientation to time 5  Orientation to Place 5  Registration 3  Attention/ Calculation 5  Recall 3  Language- name 2 objects 2  Language- repeat 1  Language- follow 3 step command 3  Language- read & follow direction 1  Write a sentence 1  Copy design 1  Total score 30        Screening Tests Health Maintenance  Topic Date Due  . Hepatitis C Screening  [birth year]  . TETANUS/TDAP  05/08/1965  . DEXA SCAN  05/09/2011  . PNA vac Low Risk Adult (2 of 2 - PPSV23) 07/17/2014  . MAMMOGRAM  01/15/2017  . COLONOSCOPY  10/13/2020  . INFLUENZA VACCINE  Completed      Plan:    End of life planning; Advance aging; Advanced directives discussed. Copy of current HCPOA/Living Will.  Additional information provided to help her start the conversation with her family. Copy of HCPOA/Living Will requested upon completion.  Time spent on this topic is 25 minutes.  Medicare Attestation I have personally reviewed: The patient's medical and social history Their use of alcohol, tobacco or illicit drugs Their current medications and supplements The patient's functional ability including ADLs,fall risks, home safety risks, cognitive, and hearing and visual impairment Diet and physical activities Evidence for depression   The patient's weight, height, BMI, and visual acuity have been recorded in the chart.  I have made referrals and provided education to the patient based on review of the above and I have provided the patient with a written  personalized care plan for preventive services.    During the course of the visit, Teal was educated and counseled about the following appropriate screening and preventive services:   Vaccines to include Pneumoccal, Influenza, Hepatitis B, Td, Zostavax, HCV  Electrocardiogram  Cardiovascular disease screening  Colorectal cancer screening  Bone  density screening  Diabetes screening  Glaucoma screening  Mammography/PAP  Nutrition counseling  Smoking cessation counseling  Patient Instructions (the written plan) were given to the patient.    Varney Biles, LPN   [id number]

## 2016-07-31 NOTE — Progress Notes (Signed)
  Tommi Rumps, MD Phone: [phone]  Sandra Ellison is a 71 y.o. female who presents today for same-day visit.  Patient notes chronic issues with burning sensation in her right shoulder radiating over to her right scapula up to her right neck. Has been going on and off for 7-8 years. Has worsened over the last week with constant burning discomfort in this area. Has had nerve conduction studies in the past with apparent damage to the ulnar nerve. No pain with moving the shoulder or neck. No weakness or numbness. No chest pain. She has been taking gabapentin on an as-needed basis. Also using Aspercreme.   ROS see history of present illness  Objective  Physical Exam Vitals:   07/31/16 1100  BP: 110/78  Pulse: 72  Temp: 97.7 F (36.5 C)    BP Readings from Last 3 Encounters:  07/31/16 110/78  07/31/16 110/78  07/03/16 110/78   Wt Readings from Last 3 Encounters:  07/31/16 135 lb 6.4 oz (61.4 kg)  07/31/16 135 lb 6.4 oz (61.4 kg)  07/03/16 134 lb 9.6 oz (61.1 kg)    Physical Exam  Constitutional: No distress.  Cardiovascular: Normal rate, regular rhythm and normal heart sounds.   Pulmonary/Chest: Effort normal and breath sounds normal.  Musculoskeletal:  Midline neck tenderness, no midline neck step-off, no muscular neck or shoulder tenderness, shoulders are symmetric, full range of motion bilateral shoulders with no discomfort  Neurological: She is alert.  5/5 strength in bilateral biceps, triceps, grip, quads, hamstrings, plantar and dorsiflexion, sensation to light touch intact in bilateral UE and LE  Skin: She is not diaphoretic.     Assessment/Plan: Please see individual problem list.  Ulnar nerve injury Patient with history of ulnar nerve injury now with persistent burning sensation in right lateral shoulder over to her scapula and to her right posterior neck. Question whether or not this could be a nerve impingement in her neck. She is neurologically intact.  Doubt shoulder pathology given benign exam. Will obtain an x-ray of her neck. She will take gabapentin 300 mg once daily as she has not been taking this consistently. Once x-ray returns we'll determine next step in management. Given return precautions.   Orders Placed This Encounter  Procedures  . DG Cervical Spine Complete    Standing Status:   Future    Number of Occurrences:   1    Standing Expiration Date:   09/28/2017    Order Specific Question:   Reason for Exam (SYMPTOM  OR DIAGNOSIS REQUIRED)    Answer:   neck pain with burning sensation to right shoulder    Order Specific Question:   Preferred imaging location?    Answer:   [facility]    Tommi Rumps, MD [place]

## 2016-07-31 NOTE — Assessment & Plan Note (Addendum)
Patient with history of ulnar nerve injury now with persistent burning sensation in right lateral shoulder over to her scapula and to her right posterior neck. Question whether or not this could be a nerve impingement in her neck. She is neurologically intact. Doubt shoulder pathology given benign exam. Will obtain an x-ray of her neck. She will take gabapentin 300 mg once daily as she has not been taking this consistently. Once x-ray returns we'll determine next step in management. Given return precautions.

## 2016-07-31 NOTE — Patient Instructions (Addendum)
  Sandra Ellison , Thank you for taking time to come for your Medicare Wellness Visit. I appreciate your ongoing commitment to your health goals. Please review the following plan we discussed and let me know if I can assist you in the future.   These are the goals we discussed: Goals    . Increase physical activity       This is a list of the screening recommended for you and due dates:  Health Maintenance  Topic Date Due  .  Hepatitis C: One time screening is recommended by Center for Disease Control  (CDC) for  adults born from [birth year] through 1965.   Jul 23, 1945  . Tetanus Vaccine  05/08/1965  . DEXA scan (bone density measurement)  05/09/2011  . Pneumonia vaccines (2 of 2 - PPSV23) 07/17/2014  . Mammogram  01/15/2017  . Colon Cancer Screening  10/13/2020  . Flu Shot  Completed    Bone Densitometry Introduction Bone densitometry is an imaging test that uses a special X-ray to measure the amount of calcium and other minerals in your bones (bone density). This test is also known as a bone mineral density test or dual-energy X-ray absorptiometry (DXA). The test can measure bone density at your hip and your spine. It is similar to having a regular X-ray. You may have this test to:  Diagnose a condition that causes weak or thin bones (osteoporosis).  Predict your risk of a broken bone (fracture).  Determine how well osteoporosis treatment is working. Tell a health care provider about:  Any allergies you have.  All medicines you are taking, including vitamins, herbs, eye drops, creams, and over-the-counter medicines.  Any problems you or family members have had with anesthetic medicines.  Any blood disorders you have.  Any surgeries you have had.  Any medical conditions you have.  Possibility of pregnancy.  Any other medical test you had within the previous 14 days that used contrast material. What are the risks? Generally, this is a safe procedure. However, problems can occur  and may include the following:  This test exposes you to a very small amount of radiation.  The risks of radiation exposure may be greater to unborn children. What happens before the procedure?  Do not take any calcium supplements for 24 hours before having the test. You can otherwise eat and drink what you usually do.  Take off all metal jewelry, eyeglasses, dental appliances, and any other metal objects. What happens during the procedure?  You may lie on an exam table. There will be an X-ray generator below you and an imaging device above you.  Other devices, such as boxes or braces, may be used to position your body properly for the scan.  You will need to lie still while the machine slowly scans your body.  The images will show up on a computer monitor. What happens after the procedure? You may need more testing at a later time. This information is not intended to replace advice given to you by your health care provider. Make sure you discuss any questions you have with your health care provider. Document Released: 06/16/2004 Document Revised: 10/31/2015 Document Reviewed: 11/02/2013  2017 Elsevier

## 2016-07-31 NOTE — Patient Instructions (Signed)
Nice to meet you. We will check a x-ray of your neck today. You will start taking the gabapentin once daily. If you have worsening symptoms or you develop numbness or weakness please seek medical attention medially.

## 2016-08-03 ENCOUNTER — Other Ambulatory Visit: Payer: Self-pay | Admitting: Internal Medicine

## 2016-08-05 ENCOUNTER — Other Ambulatory Visit: Payer: Self-pay | Admitting: Internal Medicine

## 2016-08-06 ENCOUNTER — Encounter: Payer: Self-pay | Admitting: Internal Medicine

## 2016-08-06 ENCOUNTER — Ambulatory Visit (INDEPENDENT_AMBULATORY_CARE_PROVIDER_SITE_OTHER): Payer: Medicare HMO | Admitting: Internal Medicine

## 2016-08-06 DIAGNOSIS — I428 Other cardiomyopathies: Secondary | ICD-10-CM | POA: Diagnosis not present

## 2016-08-06 DIAGNOSIS — I272 Pulmonary hypertension, unspecified: Secondary | ICD-10-CM

## 2016-08-06 DIAGNOSIS — N183 Chronic kidney disease, stage 3 unspecified: Secondary | ICD-10-CM

## 2016-08-06 DIAGNOSIS — S4401XS Injury of ulnar nerve at upper arm level, right arm, sequela: Secondary | ICD-10-CM | POA: Diagnosis not present

## 2016-08-06 DIAGNOSIS — Z720 Tobacco use: Secondary | ICD-10-CM

## 2016-08-06 DIAGNOSIS — J449 Chronic obstructive pulmonary disease, unspecified: Secondary | ICD-10-CM

## 2016-08-06 DIAGNOSIS — I779 Disorder of arteries and arterioles, unspecified: Secondary | ICD-10-CM | POA: Diagnosis not present

## 2016-08-06 DIAGNOSIS — K219 Gastro-esophageal reflux disease without esophagitis: Secondary | ICD-10-CM

## 2016-08-06 DIAGNOSIS — E785 Hyperlipidemia, unspecified: Secondary | ICD-10-CM | POA: Diagnosis not present

## 2016-08-06 DIAGNOSIS — I10 Essential (primary) hypertension: Secondary | ICD-10-CM

## 2016-08-06 DIAGNOSIS — I25111 Atherosclerotic heart disease of native coronary artery with angina pectoris with documented spasm: Secondary | ICD-10-CM

## 2016-08-06 DIAGNOSIS — I739 Peripheral vascular disease, unspecified: Secondary | ICD-10-CM

## 2016-08-06 MED ORDER — METHYLPREDNISOLONE 4 MG PO TBPK: ORAL_TABLET | ORAL | 0 refills | Status: DC

## 2016-08-06 MED ORDER — GABAPENTIN 300 MG PO CAPS: 300.0000 mg | ORAL_CAPSULE | Freq: Three times a day (TID) | ORAL | 1 refills | Status: DC

## 2016-08-06 NOTE — Progress Notes (Signed)
Patient ID: Sandra Ellison, female   DOB: [birth year], 71 y.o.   MRN: [id number]   Subjective:    Patient ID: Sandra Ellison, female    DOB: [birth year], 71 y.o.   MRN: [id number]  HPI  Patient here for a scheduled follow up.  She is accompanied by her boyfriend.  History obtained from both of them.  She was recently evaluated by Dr Caryl Bis for neck and shoulder pain.  On gabapentin.  She is still having pain.  No chest pain. Breathing stable.  No acid reflux.  No abdominal pain or cramping.  Bowels stable.  No rectal pain.  Using her inhalers.     Past Medical History:  Diagnosis Date  . Allergy   . Arthritis   . Carotid arterial disease ([place])    a. 02/2015 Carotid U/S: RICA [id number], LICA [id number].  . Chronic combined systolic and diastolic CHF (congestive heart failure) ([place])    a. 01/2012 Echo: EF 30-35%, mod conc LVH, sev inf HK, mildly dil LA, mild-mod MR, Trace TR, RVSP 40-64mmHg.    Marland Kitchen Chronic kidney disease (CKD), stage II (mild)   . COPD (chronic obstructive pulmonary disease) ([place])   . Dilated cardiomyopathy ([place])    a. 02/2011 MV: EF 27%, anteroseptal, inf, inferolateral scar, no ischemia.  Marland Kitchen GERD (gastroesophageal reflux disease)   . History of abnormal Pap smear    a. s/p vaginal hysterectomy  . History of diverticulosis   . Hx of colonic polyps   . Hyperkalemia    a. 02/2015 in setting of entresto/AKI->entresto d/c'd.  . Hypertension   . Iron deficiency anemia    a. h/o transfusions, prev followed by heme/onc.  . Pulmonary HTN   . TIA (transient ischemic attack)   . Tobacco abuse    Past Surgical History:  Procedure Laterality Date  . CARDIAC CATHETERIZATION Left 08/05/2015   Procedure: Left Heart Cath and Coronary Angiography;  Surgeon: Minna Merritts, MD;  Location: [facility];  Service: Cardiovascular;  Laterality: Left;  . COLONOSCOPY  10/2010  . ORIF DISTAL RADIUS FRACTURE  2011   right  . TONSILLECTOMY  age 39  . VAGINAL HYSTERECTOMY  1981   secondary to abnormal pap smear   Family History  Problem Relation Age of Onset  . Arthritis Mother   . Alcohol abuse Father   . Arthritis Father   . Cancer Father     throat/spine  . Breast cancer Neg Hx    Social History   Social History  . Marital status: Divorced    Spouse name: N/A  . Number of children: 1  . Years of education: N/A   Social History Main Topics  . Smoking status: Current Every Day Smoker    Packs/day: 0.50    Years: 42.00    Types: Cigarettes  . Smokeless tobacco: Never Used     Comment: Down to 3 cigarettes per day  . Alcohol use No  . Drug use: No  . Sexual activity: No   Other Topics Concern  . None   Social History Narrative  . None    Outpatient Encounter Prescriptions as of 08/06/2016  Medication Sig  . albuterol (PROVENTIL) (2.5 MG/3ML) 0.083% nebulizer solution USE ONE TREATMENT EVERY 6 HOURS AS NEEDED  . aspirin EC 81 MG tablet Take 81 mg by mouth daily.  Marland Kitchen atorvastatin (LIPITOR) 40 MG tablet TAKE 1 TABLET (40 MG TOTAL) BY MOUTH DAILY.  . carvedilol (COREG) 6.25 MG tablet Take 6.25 mg  by mouth 2 (two) times daily with a meal.  . citalopram (CELEXA) 10 MG tablet TAKE 1 TABLET (10 MG TOTAL) BY MOUTH DAILY.  Marland Kitchen clopidogrel (PLAVIX) 75 MG tablet TAKE 1 TABLET (75 MG TOTAL) BY MOUTH DAILY.  Marland Kitchen Fe Fum-FePoly-Vit C-Vit B3 (INTEGRA) 62.5-62.5-40-3 MG CAPS One per day (Patient taking differently: Take 1 capsule by mouth daily. )  . LORazepam (ATIVAN) 0.5 MG tablet Take 1 tablet (0.5 mg total) by mouth every 8 (eight) hours as needed for anxiety.  . ondansetron (ZOFRAN-ODT) 4 MG disintegrating tablet TAKE 1 TABLET (4 MG TOTAL) BY MOUTH 2 (TWO) TIMES DAILY AS NEEDED FOR NAUSEA OR VOMITING.  . pantoprazole (PROTONIX) 40 MG tablet TAKE 1 TABLET (40 MG TOTAL) BY MOUTH DAILY.  Marland Kitchen SPIRIVA HANDIHALER 18 MCG inhalation capsule INHALE ONCE DAILY  . VENTOLIN HFA 108 (90 Base) MCG/ACT inhaler INHALE 2 PUFFS EVERY 6 HOURS AS NEEDED FOR WHEEZING OR SHORTNESS OF  BREATH  . [DISCONTINUED] furosemide (LASIX) 20 MG tablet TAKE 1 TABLET (20 MG TOTAL) BY MOUTH DAILY AS NEEDED.  . [DISCONTINUED] gabapentin (NEURONTIN) 300 MG capsule Take 1 capsule (300 mg total) by mouth at bedtime.  . gabapentin (NEURONTIN) 300 MG capsule Take 1 capsule (300 mg total) by mouth 3 (three) times daily.  . methylPREDNISolone (MEDROL DOSEPAK) 4 MG TBPK tablet 6 day taper as directed.   No facility-administered encounter medications on file as of 08/06/2016.     Review of Systems  Constitutional: Negative for appetite change and unexpected weight change.  HENT: Negative for congestion and sinus pressure.   Respiratory: Negative for cough, chest tightness and shortness of breath.   Cardiovascular: Negative for chest pain, palpitations and leg swelling.  Gastrointestinal: Negative for abdominal pain, diarrhea, nausea and vomiting.  Genitourinary: Negative for difficulty urinating and dysuria.  Musculoskeletal: Negative for back pain and joint swelling.  Skin: Negative for color change and rash.  Neurological: Negative for dizziness, light-headedness and headaches.  Psychiatric/Behavioral: Negative for agitation and dysphoric mood.       Objective:    Physical Exam  Constitutional: She appears well-developed and well-nourished. No distress.  HENT:  Nose: Nose normal.  Mouth/Throat: Oropharynx is clear and moist.  Neck: Neck supple. No thyromegaly present.  Cardiovascular: Normal rate and regular rhythm.   Pulmonary/Chest: Breath sounds normal. No respiratory distress. She has no wheezes.  Abdominal: Soft. Bowel sounds are normal. There is no tenderness.  Musculoskeletal: She exhibits no edema or tenderness.  Lymphadenopathy:    She has no cervical adenopathy.  Skin: No rash noted. No erythema.  Psychiatric: She has a normal mood and affect. Her behavior is normal.    BP 130/82 (BP Location: Right Arm, Patient Position: Sitting, Cuff Size: Normal)   Pulse 73   Temp  97.4 F (36.3 C) (Oral)   Resp 18   Wt 137 lb (62.1 kg)   SpO2 97%   BMI 22.80 kg/m  Wt Readings from Last 3 Encounters:  08/07/16 135 lb 8 oz (61.5 kg)  08/06/16 137 lb (62.1 kg)  07/31/16 135 lb 6.4 oz (61.4 kg)     Lab Results  Component Value Date   WBC 4.9 07/03/2016   HGB 14.4 07/03/2016   HCT 44.2 07/03/2016   PLT 204 07/03/2016   GLUCOSE 99 07/03/2016   CHOL 91 03/26/2016   TRIG 105.0 03/26/2016   HDL 30.60 (L) 03/26/2016   LDLCALC 39 03/26/2016   ALT 15 03/26/2016   AST 18 03/26/2016   NA  137 07/03/2016   K 5.1 07/03/2016   CL 102 07/03/2016   CREATININE 1.38 (H) 07/03/2016   BUN 27 (H) 07/03/2016   CO2 25 07/03/2016   TSH 2.67 08/29/2015   INR 1.10 11/24/2015   HGBA1C 5.0 03/26/2016    US Venous Img Lower Unilateral Right  Result Date: 07/03/2016 CLINICAL DATA:  Right lower extremity pain and edema for the past 3 weeks. History of smoking and varicose veins. Evaluate for DVT. EXAM: RIGHT LOWER EXTREMITY VENOUS DOPPLER ULTRASOUND TECHNIQUE: Gray-scale sonography with graded compression, as well as color Doppler and duplex ultrasound were performed to evaluate the lower extremity deep venous systems from the level of the common femoral vein and including the common femoral, femoral, profunda femoral, popliteal and calf veins including the posterior tibial, peroneal and gastrocnemius veins when visible. The superficial great saphenous vein was also interrogated. Spectral Doppler was utilized to evaluate flow at rest and with distal augmentation maneuvers in the common femoral, femoral and popliteal veins. COMPARISON:  None. FINDINGS: Contralateral Common Femoral Vein: Respiratory phasicity is normal and symmetric with the symptomatic side. No evidence of thrombus. Normal compressibility. Common Femoral Vein: No evidence of thrombus. Normal compressibility, respiratory phasicity and response to augmentation. Saphenofemoral Junction: No evidence of thrombus. Normal  compressibility and flow on color Doppler imaging. Profunda Femoral Vein: No evidence of thrombus. Normal compressibility and flow on color Doppler imaging. Femoral Vein: No evidence of thrombus. Normal compressibility, respiratory phasicity and response to augmentation. Popliteal Vein: No evidence of thrombus. Normal compressibility, respiratory phasicity and response to augmentation. Calf Veins: No evidence of thrombus. Normal compressibility and flow on color Doppler imaging. Superficial Great Saphenous Vein: No evidence of thrombus. Normal compressibility and flow on color Doppler imaging. Venous Reflux:  None. Other Findings: Note is made of a benign appearing right inguinal lymph node which is not enlarged by size criteria (measuring 0.5 cm in greatest short axis diameter) and maintains a benign fatty hilum. IMPRESSION: No evidence of DVT within the right lower extremity. Electronically Signed   By: Sandi Mariscal M.D.   On: 07/03/2016 17:37       Assessment & Plan:   Problem List Items Addressed This Visit    CAD (coronary artery disease)    Stable.  Followed by cardiology.       Carotid arterial disease ([place])    Followed by cardiology.       CKD (chronic kidney disease), stage III    Seeing nephrology.  F/u 09/09/16.  Avoid antiinflammatories.        COPD (chronic obstructive pulmonary disease) (HCC)    Breathing stable.  Continue current medication regimen.        Relevant Medications   methylPREDNISolone (MEDROL DOSEPAK) 4 MG TBPK tablet   Essential hypertension    Blood pressure under good control.  Continue same medication regimen.  Follow pressures.  Follow metabolic panel.        GERD (gastroesophageal reflux disease)    On protonix and doing well.  Monitor for side effects while on steroids.       Hyperlipidemia    Low cholesterol diet and exercise.  On lipitor.  Follow  Lipid panel and liver function tests.        Nonischemic cardiomyopathy ([place])    Followed by  cardiology.  Stable.        Pulmonary hypertension    Previous echo with minimal increased right side heart pressures.  Followed by cardiology.  Tobacco abuse    Discussed with her regarding the need to quit.  She declines.  Follow.       Ulnar nerve injury    Previous ulnar nerve injury.  With persistent shoulder and neck pain.  Unable to take antiinflammatories.  Treat with medrol dose pack.  Notify me if desires any further intervention.  Follow.       Relevant Medications   gabapentin (NEURONTIN) 300 MG capsule       Einar Pheasant, MD

## 2016-08-06 NOTE — Progress Notes (Signed)
Pre visit review using our clinic review tool, if applicable. No additional management support is needed unless otherwise documented below in the visit note. 

## 2016-08-07 ENCOUNTER — Encounter: Payer: Self-pay | Admitting: Cardiovascular Disease

## 2016-08-07 ENCOUNTER — Ambulatory Visit (INDEPENDENT_AMBULATORY_CARE_PROVIDER_SITE_OTHER): Payer: Medicare HMO | Admitting: Cardiovascular Disease

## 2016-08-07 VITALS — BP 122/82 | HR 78 | Ht 65.0 in | Wt 135.5 lb

## 2016-08-07 DIAGNOSIS — I25111 Atherosclerotic heart disease of native coronary artery with angina pectoris with documented spasm: Secondary | ICD-10-CM | POA: Diagnosis not present

## 2016-08-07 DIAGNOSIS — E785 Hyperlipidemia, unspecified: Secondary | ICD-10-CM

## 2016-08-07 DIAGNOSIS — Z720 Tobacco use: Secondary | ICD-10-CM | POA: Diagnosis not present

## 2016-08-07 DIAGNOSIS — I5022 Chronic systolic (congestive) heart failure: Secondary | ICD-10-CM

## 2016-08-07 DIAGNOSIS — I428 Other cardiomyopathies: Secondary | ICD-10-CM | POA: Diagnosis not present

## 2016-08-07 MED ORDER — FUROSEMIDE 40 MG PO TABS: 40.0000 mg | ORAL_TABLET | Freq: Two times a day (BID) | ORAL | 3 refills | Status: DC

## 2016-08-07 MED ORDER — LISINOPRIL 5 MG PO TABS: 5.0000 mg | ORAL_TABLET | Freq: Every day | ORAL | 3 refills | Status: DC

## 2016-08-07 NOTE — Progress Notes (Signed)
Cardiology Office Note  Date:  08/07/2016   ID:  Sandra Ellison, DOB July 26, 1945, MRN [id number]  PCP:  Einar Pheasant, MD   Chief Complaint  Patient presents with  . other    6 month follow up. Meds reviewed by the pt. verbally. Pt. c/o LE edema.     HPI:  71 year old woman with a history of Pulmonary hypertension, systolic and diastolic CHF with ejection fraction 25%, COPD who continues to smoke, catheterization February 2017 with moderate left main disease, decreased pulse in her left arm, who presents for follow-up of her cardiomyopathy. History of hyperkalemia on  ARBs/Aldactone She does have a history of peptic ulcer disease, anemia, chronic renal insufficiency  In follow-up today she reports significant weight gain, leg swelling, abdominal bloating She does not drink frame much Previously was taking Lasix on a daily basis for "awhile" Now taking Lasix as needed, one or 2 times per week Denies having PND or orthopnea Trying to watch her sodium Denies any significant chest pain on exertion She has noticed mild worsening of her shortness of breath on exertion Continues to take Lipitor  EKG on today's visit reviewed personally by myself shows normal sinus rhythm T-wave abnormality V3 through V6, 2, aVF, 1 and aVL  Other past medical history reviewed stroke in June 2017 Left side deficits,gait instability Acute stroke seen on MRI left cerebellum Carotid ultrasound showing mild to moderate carotid disease bilaterally Echocardiogram during the hospital course showing ejection fraction less than 25% discharged on Plavix  peptic ulcer disease, cardiomyopathy with history of systolic and diastolic CHF, COPD with long smoking history who continues to smoke, pulmonary hypertension seen on echocardiogram in March 2012 who presented to [facility] with increasing shortness of breath on February 11, 2011, chronic renal insufficiency, stage II, severe iron deficiency anemia, history of  systolic heart failure, cardiac catheterization February 2017 for worsening ejection fraction found to have moderate left main disease, who presents for follow-up of her systolic CHF. Decreased pulse in the left arm  She has neuropathy in her legs. Other workup including CT scan of the chest 06/19/2015 showing coronary calcifications, aortic atherosclerosis. Images reviewed with her in detail  Ultrasound of the abdomen showing kidney stones, sludge in the gallbladder, atrophic left kidney. Results discussed with her She continues to smoke  Previously taken off entresto and ARB's for high potassium bidil caused headaches Reports she is tolerating hydralazine Was previously on lisinopril 10 mg daily several years ago and reports having no side effect  cardiac catheterization details : LM lesion, 65% stenosed, Ost LAD lesion, 50% stenosed, Dist Cx to LPDA lesion, 80% stenosed, 2nd Mrg lesion, 40% stenosed.  Seen in the emergency room twice, once in 05/29/2015 for shortness of breath with cough felt to be from COPD exacerbation, given a Z-Pak and steroids Second trip to the ER 07/18/2015 for nausea vomiting felt to be a GI bug  Recent CT scan of the chest reviewed with her in detail showing severe three-vessel coronary artery disease, moderate plaquing in her aorta.  Last echocardiogram December 2015 showing ejection fraction 20-25% which was down from 30-35% in August 2013  Echo done in the hospital 05/17/2014 was reviewed with her showing ejection fraction 20-25%, mildly elevated right trigger systolic pressures  Previous hospital admission in 2012 she was found to have CHF, moderate bilateral pleural effusions with BNP of 26,000. Underlying anemia was a significant component of her presentation. Previously received procrit every other week, bone marrow biopsy, received packed red blood  cells x2, iron transfusion x2, followed by Dr. Ma Hillock.  Echocardiogram in the hospital  showed ejection fraction 30-35%, mild to moderate right ventricular systolic pressure estimated at 40-50 mmHg  Echocardiogram performed February 11, 2011 shows ejection fraction less than 25% with severe global hypokinesis, apical akinesis, normal right ventricular systolic function , mild to moderate mitral valve regurgitation, normal right ventricular systolic pressures  Stress test done in the hospital on February 13 2011 shows no significant ischemia.   PMH:   has a past medical history of Allergy; Arthritis; Carotid arterial disease ([place]); Chronic combined systolic and diastolic CHF (congestive heart failure) ([place]); Chronic kidney disease (CKD), stage II (mild); COPD (chronic obstructive pulmonary disease) ([place]); Dilated cardiomyopathy ([place]); GERD (gastroesophageal reflux disease); History of abnormal Pap smear; History of diverticulosis; colonic polyps; Hyperkalemia; Hypertension; Iron deficiency anemia; Pulmonary HTN; TIA (transient ischemic attack); and Tobacco abuse.  PSH:    Past Surgical History:  Procedure Laterality Date  . CARDIAC CATHETERIZATION Left 08/05/2015   Procedure: Left Heart Cath and Coronary Angiography;  Surgeon: Minna Merritts, MD;  Location: [facility];  Service: Cardiovascular;  Laterality: Left;  . COLONOSCOPY  10/2010  . ORIF DISTAL RADIUS FRACTURE  2011   right  . TONSILLECTOMY  age 52  . VAGINAL HYSTERECTOMY  1981   secondary to abnormal pap smear    Current Outpatient Prescriptions  Medication Sig Dispense Refill  . albuterol (PROVENTIL) (2.5 MG/3ML) 0.083% nebulizer solution USE ONE TREATMENT EVERY 6 HOURS AS NEEDED 180 mL 1  . aspirin EC 81 MG tablet Take 81 mg by mouth daily.    Marland Kitchen atorvastatin (LIPITOR) 40 MG tablet TAKE 1 TABLET (40 MG TOTAL) BY MOUTH DAILY. 30 tablet 3  . carvedilol (COREG) 6.25 MG tablet Take 6.25 mg by mouth 2 (two) times daily with a meal.  3  . citalopram (CELEXA) 10 MG tablet TAKE 1 TABLET (10 MG TOTAL) BY MOUTH  DAILY. 30 tablet 3  . clopidogrel (PLAVIX) 75 MG tablet TAKE 1 TABLET (75 MG TOTAL) BY MOUTH DAILY. 30 tablet 5  . Fe Fum-FePoly-Vit C-Vit B3 (INTEGRA) 62.5-62.5-40-3 MG CAPS One per day (Patient taking differently: Take 1 capsule by mouth daily. ) 30 capsule 2  . furosemide (LASIX) 40 MG tablet Take 1 tablet (40 mg total) by mouth 2 (two) times daily. 180 tablet 3  . gabapentin (NEURONTIN) 300 MG capsule Take 1 capsule (300 mg total) by mouth 3 (three) times daily. 90 capsule 1  . LORazepam (ATIVAN) 0.5 MG tablet Take 1 tablet (0.5 mg total) by mouth every 8 (eight) hours as needed for anxiety. 30 tablet 0  . methylPREDNISolone (MEDROL DOSEPAK) 4 MG TBPK tablet 6 day taper as directed. 21 tablet 0  . ondansetron (ZOFRAN-ODT) 4 MG disintegrating tablet TAKE 1 TABLET (4 MG TOTAL) BY MOUTH 2 (TWO) TIMES DAILY AS NEEDED FOR NAUSEA OR VOMITING. 20 tablet 1  . pantoprazole (PROTONIX) 40 MG tablet TAKE 1 TABLET (40 MG TOTAL) BY MOUTH DAILY. 30 tablet 10  . SPIRIVA HANDIHALER 18 MCG inhalation capsule INHALE ONCE DAILY 30 capsule 5  . VENTOLIN HFA 108 (90 Base) MCG/ACT inhaler INHALE 2 PUFFS EVERY 6 HOURS AS NEEDED FOR WHEEZING OR SHORTNESS OF BREATH 18 Inhaler 3  . lisinopril (PRINIVIL,ZESTRIL) 5 MG tablet Take 1 tablet (5 mg total) by mouth daily. 90 tablet 3   No current facility-administered medications for this visit.      Allergies:   Ranitidine; Ranitidine hcl; and Entresto [sacubitril-valsartan]  Social History:  The patient  reports that she has been smoking Cigarettes.  She has a 21.00 pack-year smoking history. She has never used smokeless tobacco. She reports that she does not drink alcohol or use drugs.   Family History:   family history includes Alcohol abuse in her father; Arthritis in her father and mother; Cancer in her father.    Review of Systems: Review of Systems  Constitutional: Negative.        Weight gain  Respiratory: Positive for shortness of breath.   Cardiovascular:  Positive for leg swelling.  Gastrointestinal: Negative.        Abdominal bloating  Musculoskeletal: Negative.   Neurological: Negative.   Psychiatric/Behavioral: Negative.   All other systems reviewed and are negative.    PHYSICAL EXAM: VS:  BP 122/82 (BP Location: Right Arm, Patient Position: Sitting, Cuff Size: Normal)   Pulse 78   Ht _0  (1.651 m)   Wt 135 lb 8 oz (61.5 kg)   BMI 22.55 kg/m  , BMI Body mass index is 22.55 kg/m. GEN: Well nourished, well developed, in no acute distress  HEENT: normal  Neck: no JVD, carotid bruits, or masses Cardiac: RRR; S4, no murmurs, rubs, or gallops, 1+ pitting edema to below the knees bilaterally Respiratory:  Mildly decreased breath sounds throughout,  normal work of breathing GI: soft, nontender, nondistended, + BS MS: no deformity or atrophy  Skin: warm and dry, no rash Neuro:  Strength and sensation are intact Psych: euthymic mood, full affect    Recent Labs: 08/22/2015: B Natriuretic Peptide 2,890.0 08/29/2015: TSH 2.67 03/26/2016: ALT 15 07/03/2016: BUN 27; Creat 1.38; Hemoglobin 14.4; Platelets 204; Potassium 5.1; Sodium 137    Lipid Panel Lab Results  Component Value Date   CHOL 91 03/26/2016   HDL 30.60 (L) 03/26/2016   LDLCALC 39 03/26/2016   TRIG 105.0 03/26/2016      Wt Readings from Last 3 Encounters:  08/07/16 135 lb 8 oz (61.5 kg)  08/06/16 137 lb (62.1 kg)  07/31/16 135 lb 6.4 oz (61.4 kg)       ASSESSMENT AND PLAN:  Hyperlipidemia, unspecified hyperlipidemia type -  Encouraged her to stay on her Lipitor  Coronary artery disease involving native coronary artery of native heart with angina pectoris with documented spasm ([place])  Recommended smoking cessation Currently with no symptoms of angina. No further workup at this time. Continue current medication regimen.  Nonischemic cardiomyopathy ([place]) - Plan: EKG 10-GYIR, Basic Metabolic Panel (BMET) Depressed ejection fraction out of proportion to  coronary artery disease Unable to tolerate ARB's, Aldactone secondary to hyperkalemia Previously on lisinopril, minimal increase in potassium Recommended she restart lisinopril 5 mg daily She eats lots of oranges and sutures, recommended she cut back on her citrus intake Repeat BMP in 2 weeks time  Chronic systolic CHF (congestive heart failure) (HCC) -  Acute on chronic systolic CHF on today's visit Long discussion concerning her heart failure, discussed dietary changes needed, Recommended she moderate her fluid intake Stressed importance of compliance with her Lasix Weight is up 14 pounds from prior clinic visit 6 months ago, previously 121 pounds now 135 pounds Recommended she increase Lasix up to 40 mg twice a day. Once edema has improved with decreased down to 40 mg daily might even 20 mg daily  Tobacco abuse - Plan: EKG 48-NIOE, Basic Metabolic Panel (BMET) We have encouraged her to continue to work on weaning her cigarettes and smoking cessation. She will continue to work on  this and does not want any assistance with chantix.   Significant discussion concerning CHF, CHF education provided  Total encounter time more than 45 minutes  Greater than 50% was spent in counseling and coordination of care with the patient  Disposition:   F/U  6 months   Orders Placed This Encounter  Procedures  . Basic Metabolic Panel (BMET)  . EKG 12-Lead     Signed, Esmond Plants, M.D., Ph.D. 08/07/2016  [place], [place]

## 2016-08-07 NOTE — Patient Instructions (Addendum)
Medication Instructions:   Please tale lasix 40 mg twice a day for now Decrease down to 40 mg daily once fluid is much better   Please start lisinopril 5 mg daily  Labwork:  BMP in 2 weeks  Testing/Procedures:  No further testing at this time   I recommend watching educational videos on topics of interest to you at:       [email or web address]  Enter code: HEARTCARE    Follow-Up: It was a pleasure seeing you in the office today. Please call us if you have new issues that need to be addressed before your next appt.  [phone]  Your physician wants you to follow-up in: 1 month.    If you need a refill on your cardiac medications before your next appointment, please call your pharmacy.

## 2016-08-15 ENCOUNTER — Encounter: Payer: Self-pay | Admitting: Internal Medicine

## 2016-08-15 NOTE — Assessment & Plan Note (Signed)
Stable.  Followed by cardiology.   

## 2016-08-15 NOTE — Assessment & Plan Note (Signed)
Discussed with her regarding the need to quit.  She declines.  Follow.

## 2016-08-15 NOTE — Assessment & Plan Note (Signed)
Low cholesterol diet and exercise.  On lipitor.  Follow  Lipid panel and liver function tests.   

## 2016-08-15 NOTE — Assessment & Plan Note (Signed)
Breathing stable.  Continue current medication regimen.  

## 2016-08-15 NOTE — Assessment & Plan Note (Signed)
Followed by cardiology. Stable.   

## 2016-08-15 NOTE — Assessment & Plan Note (Signed)
On protonix and doing well.  Monitor for side effects while on steroids.

## 2016-08-15 NOTE — Assessment & Plan Note (Signed)
Previous echo with minimal increased right side heart pressures.  Followed by cardiology.

## 2016-08-15 NOTE — Assessment & Plan Note (Signed)
Followed by cardiology 

## 2016-08-15 NOTE — Assessment & Plan Note (Signed)
Previous ulnar nerve injury.  With persistent shoulder and neck pain.  Unable to take antiinflammatories.  Treat with medrol dose pack.  Notify me if desires any further intervention.  Follow.

## 2016-08-15 NOTE — Assessment & Plan Note (Signed)
Seeing nephrology.  F/u 09/09/16.  Avoid antiinflammatories.

## 2016-08-15 NOTE — Assessment & Plan Note (Signed)
Blood pressure under good control.  Continue same medication regimen.  Follow pressures.  Follow metabolic panel.   

## 2016-08-16 NOTE — Progress Notes (Signed)
I have reviewed the above note and agree.  Kasch Borquez, M.D.  

## 2016-08-21 ENCOUNTER — Other Ambulatory Visit (INDEPENDENT_AMBULATORY_CARE_PROVIDER_SITE_OTHER): Payer: Medicare HMO

## 2016-08-21 DIAGNOSIS — E785 Hyperlipidemia, unspecified: Secondary | ICD-10-CM | POA: Diagnosis not present

## 2016-08-21 DIAGNOSIS — I428 Other cardiomyopathies: Secondary | ICD-10-CM

## 2016-08-21 DIAGNOSIS — I5022 Chronic systolic (congestive) heart failure: Secondary | ICD-10-CM | POA: Diagnosis not present

## 2016-08-21 DIAGNOSIS — I25111 Atherosclerotic heart disease of native coronary artery with angina pectoris with documented spasm: Secondary | ICD-10-CM

## 2016-08-21 DIAGNOSIS — Z720 Tobacco use: Secondary | ICD-10-CM

## 2016-08-22 LAB — BASIC METABOLIC PANEL
BUN / CREAT RATIO: 23 (ref 12–28)
BUN: 70 mg/dL — ABNORMAL HIGH (ref 8–27)
CHLORIDE: 94 mmol/L — AB (ref 96–106)
CO2: 24 mmol/L (ref 18–29)
Calcium: 8.8 mg/dL (ref 8.7–10.3)
Creatinine, Ser: 3.01 mg/dL — ABNORMAL HIGH (ref 0.57–1.00)
GFR calc non Af Amer: 15 mL/min/{1.73_m2} — ABNORMAL LOW (ref >59)
GFR, EST AFRICAN AMERICAN: 17 mL/min/{1.73_m2} — AB (ref >59)
Glucose: 83 mg/dL (ref 65–99)
POTASSIUM: 5.5 mmol/L — AB (ref 3.5–5.2)
Sodium: 137 mmol/L (ref 134–144)

## 2016-08-24 ENCOUNTER — Other Ambulatory Visit: Payer: Self-pay

## 2016-08-24 DIAGNOSIS — E860 Dehydration: Secondary | ICD-10-CM

## 2016-08-26 ENCOUNTER — Other Ambulatory Visit: Payer: Self-pay | Admitting: Cardiovascular Disease

## 2016-08-31 ENCOUNTER — Other Ambulatory Visit (INDEPENDENT_AMBULATORY_CARE_PROVIDER_SITE_OTHER): Payer: Medicare HMO

## 2016-08-31 DIAGNOSIS — E860 Dehydration: Secondary | ICD-10-CM

## 2016-09-01 LAB — BASIC METABOLIC PANEL
BUN / CREAT RATIO: 26 (ref 12–28)
BUN: 42 mg/dL — ABNORMAL HIGH (ref 8–27)
CO2: 25 mmol/L (ref 18–29)
CREATININE: 1.63 mg/dL — AB (ref 0.57–1.00)
Calcium: 9.1 mg/dL (ref 8.7–10.3)
Chloride: 100 mmol/L (ref 96–106)
GFR, EST AFRICAN AMERICAN: 37 mL/min/{1.73_m2} — AB (ref >59)
GFR, EST NON AFRICAN AMERICAN: 32 mL/min/{1.73_m2} — AB (ref >59)
Glucose: 90 mg/dL (ref 65–99)
POTASSIUM: 4.9 mmol/L (ref 3.5–5.2)
Sodium: 140 mmol/L (ref 134–144)

## 2016-09-03 NOTE — Progress Notes (Signed)
Cardiology Office Note  Date:  09/07/2016   ID:  Sandra Ellison, DOB 09/06/1945, MRN [id number]  PCP:  Einar Pheasant, MD   Chief Complaint  Patient presents with  . other    1 month f/u no complaints today. Meds reviewed verbally with pt.    HPI:   71 year old woman with a history of  Pulmonary hypertension,  systolic and diastolic CHF with ejection fraction 25%,  COPD  Active smoker, catheterization February 2017 with moderate left main disease,  decreased pulse in her left arm,  peptic ulcer disease, anemia, chronic renal insufficiency who presents for follow-up of her cardiomyopathy.  History of hyperkalemia on  ARBs/Aldactone We Recommended she restart lisinopril 5 mg daily  On her last clinic visit she had significant weight gain, leg swelling, abdominal bloating We've recommended she increase Lasix up to 40 mg twice a day until fluid improved Then recommended she take Lasix 40 mg daily Weight was up 14 pounds, 121 up to 135 pounds We restarted lisinopril 5 mg daily We did check her lab work 2 weeks ago that showed acute renal failure secondary to prerenal state. We've recommended she hold her Lasix with recheck BMP 1 week ago, improvement of creatinine down to 1.63 above her baseline 1.3  This AM took 20 mg lasix, For slight swelling in her legs She has not taken much Lasix in the past week or 2 since episode of acute renal failure from dehydration Overall feels well no complaints She does not have a scale at home  Denies any significant shortness of breath, some gait instability, knee pain  Depressed ejection fraction out of proportion to coronary artery disease Unable to tolerate ARB's, Aldactone secondary to hyperkalemia Previously on lisinopril, minimal increase in potassium  Weight was up 14 pounds from prior clinic visit, previously 121 pounds, last clinic visit up to 135 pounds Recommended she increase Lasix up to 40 mg twice a day. Once edema has improved  with decreased down to 40 mg daily might even 20 mg daily   Other past medical history reviewed stroke in June 2017 Left side deficits,gait instability Acute stroke seen on MRI left cerebellum Carotid ultrasound showing mild to moderate carotid disease bilaterally Echocardiogram during the hospital course showing ejection fraction less than 25% discharged on Plavix  peptic ulcer disease, cardiomyopathy with history of systolic and diastolic CHF, COPD with long smoking history who continues to smoke, pulmonary hypertension seen on echocardiogram in March 2012 who presented to [facility] with increasing shortness of breath on February 11, 2011, chronic renal insufficiency, stage II, severe iron deficiency anemia, history of systolic heart failure, cardiac catheterization February 2017 for worsening ejection fraction found to have moderate left main disease, who presents for follow-up of her systolic CHF. Decreased pulse in the left arm  She has neuropathy in her legs. Other workup including CT scan of the chest 06/19/2015 showing coronary calcifications, aortic atherosclerosis. Images reviewed with her in detail  Ultrasound of the abdomen showing kidney stones, sludge in the gallbladder, atrophic left kidney. Results discussed with her She continues to smoke  Previously taken off entresto and ARB's for high potassium bidil caused headaches Reports she is tolerating hydralazine Was previously on lisinopril 10 mg daily several years ago and reports having no side effect  cardiac catheterization details : LM lesion, 65% stenosed, Ost LAD lesion, 50% stenosed, Dist Cx to LPDA lesion, 80% stenosed, 2nd Mrg lesion, 40% stenosed.  Seen in the emergency room twice, once in 05/29/2015 for  shortness of breath with cough felt to be from COPD exacerbation, given a Z-Pak and steroids Second trip to the ER 07/18/2015 for nausea vomiting felt to be a GI bug  Recent CT scan of the chest reviewed with  her in detail showing severe three-vessel coronary artery disease, moderate plaquing in her aorta.  Last echocardiogram December 2015 showing ejection fraction 20-25% which was down from 30-35% in August 2013  Echo done in the hospital 05/17/2014 was reviewed with her showing ejection fraction 20-25%, mildly elevated right trigger systolic pressures  Previous hospital admission in 2012 she was found to have CHF, moderate bilateral pleural effusions with BNP of 26,000. Underlying anemia was a significant component of her presentation. Previously received procrit every other week, bone marrow biopsy, received packed red blood cells x2, iron transfusion x2, followed by Dr. Ma Hillock.  Echocardiogram in the hospital showed ejection fraction 30-35%, mild to moderate right ventricular systolic pressure estimated at 40-50 mmHg  Echocardiogram performed February 11, 2011 shows ejection fraction less than 25% with severe global hypokinesis, apical akinesis, normal right ventricular systolic function , mild to moderate mitral valve regurgitation, normal right ventricular systolic pressures  Stress test done in the hospital on February 13 2011 shows no significant ischemia.   PMH:   has a past medical history of Allergy; Arthritis; Carotid arterial disease ([place]); Chronic combined systolic and diastolic CHF (congestive heart failure) ([place]); Chronic kidney disease (CKD), stage II (mild); COPD (chronic obstructive pulmonary disease) ([place]); Dilated cardiomyopathy ([place]); GERD (gastroesophageal reflux disease); History of abnormal Pap smear; History of diverticulosis; colonic polyps; Hyperkalemia; Hypertension; Iron deficiency anemia; Pulmonary HTN; TIA (transient ischemic attack); and Tobacco abuse.  PSH:    Past Surgical History:  Procedure Laterality Date  . CARDIAC CATHETERIZATION Left 08/05/2015   Procedure: Left Heart Cath and Coronary Angiography;  Surgeon: Minna Merritts, MD;  Location: [facility];  Service: Cardiovascular;  Laterality: Left;  . COLONOSCOPY  10/2010  . ORIF DISTAL RADIUS FRACTURE  2011   right  . TONSILLECTOMY  age 59  . VAGINAL HYSTERECTOMY  1981   secondary to abnormal pap smear    Current Outpatient Prescriptions  Medication Sig Dispense Refill  . albuterol (PROVENTIL) (2.5 MG/3ML) 0.083% nebulizer solution USE ONE TREATMENT EVERY 6 HOURS AS NEEDED 180 mL 1  . aspirin EC 81 MG tablet Take 81 mg by mouth daily.    Marland Kitchen atorvastatin (LIPITOR) 40 MG tablet TAKE 1 TABLET (40 MG TOTAL) BY MOUTH DAILY. 30 tablet 3  . carvedilol (COREG) 6.25 MG tablet Take 6.25 mg by mouth 2 (two) times daily with a meal.  3  . citalopram (CELEXA) 10 MG tablet TAKE 1 TABLET (10 MG TOTAL) BY MOUTH DAILY. 30 tablet 3  . clopidogrel (PLAVIX) 75 MG tablet TAKE 1 TABLET (75 MG TOTAL) BY MOUTH DAILY. 30 tablet 5  . Fe Fum-FePoly-Vit C-Vit B3 (INTEGRA) 62.5-62.5-40-3 MG CAPS One per day (Patient taking differently: Take 1 capsule by mouth daily. ) 30 capsule 2  . furosemide (LASIX) 40 MG tablet Take 1 tablet (40 mg total) by mouth 2 (two) times daily. 180 tablet 3  . gabapentin (NEURONTIN) 300 MG capsule Take 1 capsule (300 mg total) by mouth 3 (three) times daily. 90 capsule 1  . lisinopril (PRINIVIL,ZESTRIL) 5 MG tablet Take 1 tablet (5 mg total) by mouth daily. 90 tablet 3  . LORazepam (ATIVAN) 0.5 MG tablet Take 1 tablet (0.5 mg total) by mouth every 8 (eight) hours as needed  for anxiety. 30 tablet 0  . ondansetron (ZOFRAN-ODT) 4 MG disintegrating tablet TAKE 1 TABLET (4 MG TOTAL) BY MOUTH 2 (TWO) TIMES DAILY AS NEEDED FOR NAUSEA OR VOMITING. 20 tablet 1  . pantoprazole (PROTONIX) 40 MG tablet TAKE 1 TABLET (40 MG TOTAL) BY MOUTH DAILY. 30 tablet 10  . SPIRIVA HANDIHALER 18 MCG inhalation capsule INHALE ONCE DAILY 30 capsule 5  . VENTOLIN HFA 108 (90 Base) MCG/ACT inhaler INHALE 2 PUFFS EVERY 6 HOURS AS NEEDED FOR WHEEZING OR SHORTNESS OF BREATH 18 Inhaler 3   No current  facility-administered medications for this visit.      Allergies:   Ranitidine; Ranitidine hcl; and Entresto [sacubitril-valsartan]   Social History:  The patient  reports that she has been smoking Cigarettes.  She has a 21.00 pack-year smoking history. She has never used smokeless tobacco. She reports that she does not drink alcohol or use drugs.   Family History:   family history includes Alcohol abuse in her father; Arthritis in her father and mother; Cancer in her father.    Review of Systems: Review of Systems  Constitutional: Negative.        Weight gain  Respiratory: Negative.   Cardiovascular: Negative.   Gastrointestinal: Negative.        Abdominal bloating  Musculoskeletal: Negative.   Neurological: Negative.   Psychiatric/Behavioral: Negative.   All other systems reviewed and are negative.    PHYSICAL EXAM: VS:  BP 136/78 (BP Location: Right Arm, Patient Position: Sitting, Cuff Size: Normal)   Pulse 74   Ht 5' 5.5" (1.664 m)   Wt 121 lb 12 oz (55.2 kg)   BMI 19.95 kg/m  , BMI Body mass index is 19.95 kg/m. GEN: Well nourished, well developed, in no acute distress  HEENT: normal  Neck: no JVD, carotid bruits, or masses Cardiac: RRR; S4, no murmurs, rubs, or gallops, No significant lower extremity edema Respiratory:  Mildly decreased breath sounds throughout,  normal work of breathing GI: soft, nontender, nondistended, + BS MS: no deformity or atrophy  Skin: warm and dry, no rash Neuro:  Strength and sensation are intact Psych: euthymic mood, full affect    Recent Labs: 03/26/2016: ALT 15 07/03/2016: Hemoglobin 14.4; Platelets 204 08/31/2016: BUN 42; Creatinine, Ser 1.63; Potassium 4.9; Sodium 140    Lipid Panel Lab Results  Component Value Date   CHOL 91 03/26/2016   HDL 30.60 (L) 03/26/2016   LDLCALC 39 03/26/2016   TRIG 105.0 03/26/2016      Wt Readings from Last 3 Encounters:  09/07/16 121 lb 12 oz (55.2 kg)  08/07/16 135 lb 8 oz (61.5 kg)   08/06/16 137 lb (62.1 kg)        ASSESSMENT AND PLAN:   Hyperlipidemia, unspecified hyperlipidemia type -   stay on her Lipitor, goal LDL<70  Coronary artery disease involving native coronary artery of native heart with angina pectoris with documented spasm (HCC)  Recommended smoking cessation Currently with no symptoms of angina. No further workup at this time. Continue current medication regimen.  Nonischemic cardiomyopathy ([place]) -  Depressed ejection fraction out of proportion to coronary artery disease Unable to tolerate ARB's, Aldactone secondary to hyperkalemia Previously on lisinopril, minimal increase in potassium Recommended she stay on low-dose lisinopril, beta blocker, diuretic  Chronic systolic CHF (congestive heart failure) (HCC) -  Weight is back down to her baseline 121 pounds Recommended she take Lasix 20 or 40 mg daily when necessary for weight 124 up to 125 pounds She  had prerenal state 2 weeks ago from taking too much Lasix Currently feels back to her baseline She is on beta blocker, ACE inhibitor She is on beta blocker, low-dose ACE inhibitor  Tobacco abuse - Plan: EKG [id number], Basic Metabolic Panel (BMET) 5 minutes of smoking cessation discussion today, she does not want help  Significant discussion concerning CHF, CHF education provided We did call CHF clinic for a scale, they are unable to provide Recommended she buy her own scale  Total encounter time more than 25 minutes  Greater than 50% was spent in counseling and coordination of care with the patient  Disposition:   F/U  3 months  No orders of the defined types were placed in this encounter.    Signed, Esmond Plants, M.D., Ph.D. 09/07/2016  [place], [place]

## 2016-09-07 ENCOUNTER — Ambulatory Visit (INDEPENDENT_AMBULATORY_CARE_PROVIDER_SITE_OTHER): Payer: Medicare HMO | Admitting: Cardiovascular Disease

## 2016-09-07 ENCOUNTER — Encounter: Payer: Self-pay | Admitting: Cardiovascular Disease

## 2016-09-07 VITALS — BP 136/78 | HR 74 | Ht 65.5 in | Wt 121.8 lb

## 2016-09-07 DIAGNOSIS — I5022 Chronic systolic (congestive) heart failure: Secondary | ICD-10-CM | POA: Diagnosis not present

## 2016-09-07 DIAGNOSIS — J432 Centrilobular emphysema: Secondary | ICD-10-CM | POA: Diagnosis not present

## 2016-09-07 DIAGNOSIS — R0602 Shortness of breath: Secondary | ICD-10-CM

## 2016-09-07 DIAGNOSIS — I25111 Atherosclerotic heart disease of native coronary artery with angina pectoris with documented spasm: Secondary | ICD-10-CM | POA: Diagnosis not present

## 2016-09-07 DIAGNOSIS — I428 Other cardiomyopathies: Secondary | ICD-10-CM

## 2016-09-07 DIAGNOSIS — I10 Essential (primary) hypertension: Secondary | ICD-10-CM | POA: Diagnosis not present

## 2016-09-07 DIAGNOSIS — N183 Chronic kidney disease, stage 3 unspecified: Secondary | ICD-10-CM

## 2016-09-07 DIAGNOSIS — I272 Pulmonary hypertension, unspecified: Secondary | ICD-10-CM

## 2016-09-07 DIAGNOSIS — F17200 Nicotine dependence, unspecified, uncomplicated: Secondary | ICD-10-CM

## 2016-09-07 DIAGNOSIS — I779 Disorder of arteries and arterioles, unspecified: Secondary | ICD-10-CM | POA: Diagnosis not present

## 2016-09-07 DIAGNOSIS — I739 Peripheral vascular disease, unspecified: Secondary | ICD-10-CM

## 2016-09-07 DIAGNOSIS — E782 Mixed hyperlipidemia: Secondary | ICD-10-CM | POA: Diagnosis not present

## 2016-09-07 MED ORDER — FUROSEMIDE 40 MG PO TABS: 40.0000 mg | ORAL_TABLET | Freq: Every day | ORAL | 3 refills | Status: DC | PRN

## 2016-09-07 NOTE — Patient Instructions (Addendum)
Medication Instructions:   Please check weight daily Please take lasix 20 up to 40 mg as needed for weight 124 to 125 pounds  Labwork:  No new labs needed  Testing/Procedures:  No further testing at this time   I recommend watching educational videos on topics of interest to you at:       [email or web address]  Enter code: [email or web address]    Follow-Up: It was a pleasure seeing you in the office today. Please call us if you have new issues that need to be addressed before your next appt.  [phone]  Your physician wants you to follow-up in: 3 months.  You will receive a reminder letter in the mail two months in advance. If you don't receive a letter, please call our office to schedule the follow-up appointment.  If you need a refill on your cardiac medications before your next appointment, please call your pharmacy.

## 2016-09-09 ENCOUNTER — Telehealth: Payer: Self-pay

## 2016-09-09 MED ORDER — GABAPENTIN 300 MG PO CAPS: 300.0000 mg | ORAL_CAPSULE | Freq: Three times a day (TID) | ORAL | 1 refills | Status: DC

## 2016-09-09 NOTE — Telephone Encounter (Signed)
Medication: Gabapentin 300mg  Directions:1 po tid  Last given: 08-06-16 #90 Number refills: 0 Would like 90 day supply is that ok?

## 2016-09-09 NOTE — Telephone Encounter (Signed)
rx ok'd for gabapentin #270 with one refill.

## 2016-09-22 ENCOUNTER — Other Ambulatory Visit: Payer: Self-pay | Admitting: Internal Medicine

## 2016-09-22 NOTE — Telephone Encounter (Signed)
Ok to refill? Looks like last script was given with 1 refill?

## 2016-09-23 NOTE — Telephone Encounter (Signed)
She should not be needing zofran this often. If acute issues, needs to be seen.

## 2016-09-24 NOTE — Telephone Encounter (Signed)
Pt called back returning you call. Thank you!  Call pt @ [phone]

## 2016-09-24 NOTE — Telephone Encounter (Signed)
Left message to return call to our office.  

## 2016-09-28 NOTE — Telephone Encounter (Signed)
Number not available

## 2016-10-05 NOTE — Telephone Encounter (Signed)
Not able to reach patient

## 2016-10-13 ENCOUNTER — Encounter: Payer: Self-pay | Admitting: Internal Medicine

## 2016-10-13 ENCOUNTER — Other Ambulatory Visit: Payer: Self-pay | Admitting: Internal Medicine

## 2016-10-13 ENCOUNTER — Ambulatory Visit (INDEPENDENT_AMBULATORY_CARE_PROVIDER_SITE_OTHER): Payer: Medicare HMO | Admitting: Internal Medicine

## 2016-10-13 VITALS — BP 142/80 | HR 66 | Temp 98.1°F | Wt 125.0 lb

## 2016-10-13 DIAGNOSIS — E875 Hyperkalemia: Secondary | ICD-10-CM

## 2016-10-13 DIAGNOSIS — I779 Disorder of arteries and arterioles, unspecified: Secondary | ICD-10-CM

## 2016-10-13 DIAGNOSIS — F419 Anxiety disorder, unspecified: Secondary | ICD-10-CM

## 2016-10-13 DIAGNOSIS — J432 Centrilobular emphysema: Secondary | ICD-10-CM | POA: Diagnosis not present

## 2016-10-13 DIAGNOSIS — Z1211 Encounter for screening for malignant neoplasm of colon: Secondary | ICD-10-CM | POA: Diagnosis not present

## 2016-10-13 DIAGNOSIS — N183 Chronic kidney disease, stage 3 unspecified: Secondary | ICD-10-CM

## 2016-10-13 DIAGNOSIS — K219 Gastro-esophageal reflux disease without esophagitis: Secondary | ICD-10-CM | POA: Diagnosis not present

## 2016-10-13 DIAGNOSIS — F17200 Nicotine dependence, unspecified, uncomplicated: Secondary | ICD-10-CM | POA: Diagnosis not present

## 2016-10-13 DIAGNOSIS — I739 Peripheral vascular disease, unspecified: Secondary | ICD-10-CM

## 2016-10-13 DIAGNOSIS — E782 Mixed hyperlipidemia: Secondary | ICD-10-CM

## 2016-10-13 DIAGNOSIS — I25111 Atherosclerotic heart disease of native coronary artery with angina pectoris with documented spasm: Secondary | ICD-10-CM | POA: Diagnosis not present

## 2016-10-13 DIAGNOSIS — I10 Essential (primary) hypertension: Secondary | ICD-10-CM

## 2016-10-13 DIAGNOSIS — I5022 Chronic systolic (congestive) heart failure: Secondary | ICD-10-CM | POA: Diagnosis not present

## 2016-10-13 LAB — BASIC METABOLIC PANEL
BUN: 35 mg/dL — ABNORMAL HIGH (ref 6–23)
CALCIUM: 9.5 mg/dL (ref 8.4–10.5)
CO2: 27 meq/L (ref 19–32)
Chloride: 107 mEq/L (ref 96–112)
Creatinine, Ser: 1.68 mg/dL — ABNORMAL HIGH (ref 0.40–1.20)
GFR: 31.98 mL/min — ABNORMAL LOW (ref >60.00)
GLUCOSE: 84 mg/dL (ref 70–99)
Potassium: 5.4 mEq/L — ABNORMAL HIGH (ref 3.5–5.1)
SODIUM: 138 meq/L (ref 135–145)

## 2016-10-13 LAB — TSH: TSH: 3.39 u[IU]/mL (ref 0.35–4.50)

## 2016-10-13 LAB — HEPATIC FUNCTION PANEL
ALT: 11 U/L (ref 0–35)
AST: 16 U/L (ref 0–37)
Albumin: 4.2 g/dL (ref 3.5–5.2)
Alkaline Phosphatase: 87 U/L (ref 39–117)
BILIRUBIN TOTAL: 0.8 mg/dL (ref 0.2–1.2)
Bilirubin, Direct: 0.3 mg/dL (ref 0.0–0.3)
Total Protein: 7 g/dL (ref 6.0–8.3)

## 2016-10-13 LAB — VITAMIN B12: Vitamin B-12: 196 pg/mL — ABNORMAL LOW (ref 211–911)

## 2016-10-13 MED ORDER — ONDANSETRON 4 MG PO TBDP: 4.0000 mg | ORAL_TABLET | Freq: Two times a day (BID) | ORAL | 0 refills | Status: DC | PRN

## 2016-10-13 NOTE — Progress Notes (Signed)
Patient ID: Sandra Ellison, female   DOB: 1946/03/19, 71 y.o.   MRN: [id number]   Subjective:    Patient ID: Sandra Ellison, female    DOB: 1946/02/09, 71 y.o.   MRN: [id number]  HPI  Patient here for a scheduled follow up.  She is accompanied by her boyfriend.  History obtained from both of them.  She reports she is doing relatively well.  States her breathing is stable.  No increased sob.  No chest pain.  Sees Dr Rockey Situ.  Evaluated last month.  Not taking lasix regularly now.  Only takes prn.  Not really taking now.  No abdominal pain.  Bowels moving.  Decreased swelling.     Past Medical History:  Diagnosis Date  . Allergy   . Arthritis   . Carotid arterial disease ([place])    a. 02/2015 Carotid U/S: RICA 40-08%, LICA 67-61%.  . Chronic combined systolic and diastolic CHF (congestive heart failure) ([place])    a. 01/2012 Echo: EF 30-35%, mod conc LVH, sev inf HK, mildly dil LA, mild-mod MR, Trace TR, RVSP 40-58mmHg.    Marland Kitchen Chronic kidney disease (CKD), stage II (mild)   . COPD (chronic obstructive pulmonary disease) ([place])   . Dilated cardiomyopathy ([place])    a. 02/2011 MV: EF 27%, anteroseptal, inf, inferolateral scar, no ischemia.  Marland Kitchen GERD (gastroesophageal reflux disease)   . History of abnormal Pap smear    a. s/p vaginal hysterectomy  . History of diverticulosis   . Hx of colonic polyps   . Hyperkalemia    a. 02/2015 in setting of entresto/AKI->entresto d/c'd.  . Hypertension   . Iron deficiency anemia    a. h/o transfusions, prev followed by heme/onc.  . Pulmonary HTN ([place])   . TIA (transient ischemic attack)   . Tobacco abuse    Past Surgical History:  Procedure Laterality Date  . CARDIAC CATHETERIZATION Left 08/05/2015   Procedure: Left Heart Cath and Coronary Angiography;  Surgeon: Minna Merritts, MD;  Location: [facility];  Service: Cardiovascular;  Laterality: Left;  . COLONOSCOPY  10/2010  . ORIF DISTAL RADIUS FRACTURE  2011   right  . TONSILLECTOMY  age 48  .  VAGINAL HYSTERECTOMY  1981   secondary to abnormal pap smear   Family History  Problem Relation Age of Onset  . Arthritis Mother   . Alcohol abuse Father   . Arthritis Father   . Cancer Father        throat/spine  . Breast cancer Neg Hx    Social History   Social History  . Marital status: Divorced    Spouse name: N/A  . Number of children: 1  . Years of education: N/A   Social History Main Topics  . Smoking status: Current Every Day Smoker    Packs/day: 0.50    Years: 42.00    Types: Cigarettes  . Smokeless tobacco: Never Used     Comment: Down to 3 cigarettes per day  . Alcohol use No  . Drug use: No  . Sexual activity: No   Other Topics Concern  . None   Social History Narrative  . None    Outpatient Encounter Prescriptions as of 10/13/2016  Medication Sig  . albuterol (PROVENTIL) (2.5 MG/3ML) 0.083% nebulizer solution USE ONE TREATMENT EVERY 6 HOURS AS NEEDED  . aspirin EC 81 MG tablet Take 81 mg by mouth daily.  Marland Kitchen atorvastatin (LIPITOR) 40 MG tablet TAKE 1 TABLET (40 MG TOTAL) BY MOUTH  DAILY.  . carvedilol (COREG) 6.25 MG tablet Take 6.25 mg by mouth 2 (two) times daily with a meal.  . citalopram (CELEXA) 10 MG tablet TAKE 1 TABLET (10 MG TOTAL) BY MOUTH DAILY.  Marland Kitchen clopidogrel (PLAVIX) 75 MG tablet TAKE 1 TABLET (75 MG TOTAL) BY MOUTH DAILY.  Marland Kitchen Fe Fum-FePoly-Vit C-Vit B3 (INTEGRA) 62.5-62.5-40-3 MG CAPS One per day (Patient taking differently: Take 1 capsule by mouth daily. )  . furosemide (LASIX) 40 MG tablet Take 1 tablet (40 mg total) by mouth daily as needed.  . gabapentin (NEURONTIN) 300 MG capsule Take 1 capsule (300 mg total) by mouth 3 (three) times daily.  Marland Kitchen lisinopril (PRINIVIL,ZESTRIL) 5 MG tablet Take 1 tablet (5 mg total) by mouth daily.  Marland Kitchen LORazepam (ATIVAN) 0.5 MG tablet Take 1 tablet (0.5 mg total) by mouth every 8 (eight) hours as needed for anxiety.  . ondansetron (ZOFRAN-ODT) 4 MG disintegrating tablet Take 1 tablet (4 mg total) by mouth 2 (two)  times daily as needed for nausea or vomiting.  . pantoprazole (PROTONIX) 40 MG tablet TAKE 1 TABLET (40 MG TOTAL) BY MOUTH DAILY.  Marland Kitchen SPIRIVA HANDIHALER 18 MCG inhalation capsule INHALE ONCE DAILY  . VENTOLIN HFA 108 (90 Base) MCG/ACT inhaler INHALE 2 PUFFS EVERY 6 HOURS AS NEEDED FOR WHEEZING OR SHORTNESS OF BREATH  . [DISCONTINUED] ondansetron (ZOFRAN-ODT) 4 MG disintegrating tablet TAKE 1 TABLET (4 MG TOTAL) BY MOUTH 2 (TWO) TIMES DAILY AS NEEDED FOR NAUSEA OR VOMITING.   No facility-administered encounter medications on file as of 10/13/2016.     Review of Systems  Constitutional: Negative for appetite change and unexpected weight change.  HENT: Negative for congestion and sinus pressure.   Respiratory: Negative for cough and chest tightness.        No increased sob.    Cardiovascular: Negative for chest pain, palpitations and leg swelling.  Gastrointestinal: Negative for abdominal pain, diarrhea, nausea and vomiting.  Genitourinary: Negative for difficulty urinating and dysuria.  Musculoskeletal: Negative for back pain and joint swelling.  Skin: Negative for color change and rash.  Neurological: Negative for dizziness, light-headedness and headaches.  Psychiatric/Behavioral: Negative for agitation and dysphoric mood.       Objective:    Physical Exam  Constitutional: She appears well-developed and well-nourished. No distress.  HENT:  Nose: Nose normal.  Mouth/Throat: Oropharynx is clear and moist.  Neck: Neck supple. No thyromegaly present.  Cardiovascular: Normal rate and regular rhythm.   Pulmonary/Chest: Breath sounds normal. No respiratory distress. She has no wheezes.  Abdominal: Soft. Bowel sounds are normal. There is no tenderness.  Musculoskeletal: She exhibits no edema or tenderness.  Lymphadenopathy:    She has no cervical adenopathy.  Skin: No rash noted. No erythema.  Psychiatric: She has a normal mood and affect. Her behavior is normal.    BP (!) 142/80    Pulse 66   Temp 98.1 F (36.7 C) (Oral)   Wt 125 lb (56.7 kg)   SpO2 98%   BMI 20.48 kg/m  Wt Readings from Last 3 Encounters:  10/13/16 125 lb (56.7 kg)  09/07/16 121 lb 12 oz (55.2 kg)  08/07/16 135 lb 8 oz (61.5 kg)     Lab Results  Component Value Date   WBC 4.9 07/03/2016   HGB 14.4 07/03/2016   HCT 44.2 07/03/2016   PLT 204 07/03/2016   GLUCOSE 84 10/13/2016   CHOL 91 03/26/2016   TRIG 105.0 03/26/2016   HDL 30.60 (L) 03/26/2016  LDLCALC 39 03/26/2016   ALT 11 10/13/2016   AST 16 10/13/2016   NA 138 10/13/2016   K 5.4 (H) 10/15/2016   CL 107 10/13/2016   CREATININE 1.68 (H) 10/13/2016   BUN 35 (H) 10/13/2016   CO2 27 10/13/2016   TSH 3.39 10/13/2016   INR 1.10 11/24/2015   HGBA1C 5.0 03/26/2016    US Venous Img Lower Unilateral Right  Result Date: 07/03/2016 CLINICAL DATA:  Right lower extremity pain and edema for the past 3 weeks. History of smoking and varicose veins. Evaluate for DVT. EXAM: RIGHT LOWER EXTREMITY VENOUS DOPPLER ULTRASOUND TECHNIQUE: Gray-scale sonography with graded compression, as well as color Doppler and duplex ultrasound were performed to evaluate the lower extremity deep venous systems from the level of the common femoral vein and including the common femoral, femoral, profunda femoral, popliteal and calf veins including the posterior tibial, peroneal and gastrocnemius veins when visible. The superficial great saphenous vein was also interrogated. Spectral Doppler was utilized to evaluate flow at rest and with distal augmentation maneuvers in the common femoral, femoral and popliteal veins. COMPARISON:  None. FINDINGS: Contralateral Common Femoral Vein: Respiratory phasicity is normal and symmetric with the symptomatic side. No evidence of thrombus. Normal compressibility. Common Femoral Vein: No evidence of thrombus. Normal compressibility, respiratory phasicity and response to augmentation. Saphenofemoral Junction: No evidence of thrombus.  Normal compressibility and flow on color Doppler imaging. Profunda Femoral Vein: No evidence of thrombus. Normal compressibility and flow on color Doppler imaging. Femoral Vein: No evidence of thrombus. Normal compressibility, respiratory phasicity and response to augmentation. Popliteal Vein: No evidence of thrombus. Normal compressibility, respiratory phasicity and response to augmentation. Calf Veins: No evidence of thrombus. Normal compressibility and flow on color Doppler imaging. Superficial Great Saphenous Vein: No evidence of thrombus. Normal compressibility and flow on color Doppler imaging. Venous Reflux:  None. Other Findings: Note is made of a benign appearing right inguinal lymph node which is not enlarged by size criteria (measuring 0.5 cm in greatest short axis diameter) and maintains a benign fatty hilum. IMPRESSION: No evidence of DVT within the right lower extremity. Electronically Signed   By: Sandi Mariscal M.D.   On: 07/03/2016 17:37       Assessment & Plan:   Problem List Items Addressed This Visit    Anxiety    Doing better on citalopram.  Follow.        CAD (coronary artery disease) - Primary    Followed by cardiology.  Continue risk factor modification.  Currently asymptomatic.  Follow.        Carotid arterial disease ([place])    Followed by cardiology.        Chronic systolic CHF (congestive heart failure) ([place])    Monitoring her weight.  Has been stable without lasix.  Breathing better.  Stable.  Continue to f/u with cardiology.        CKD (chronic kidney disease), stage III    Followed by nephrology.  Avoid antiinflammatories.  Follow metabolic panel.  Has f/u scheduled 11/2016.        Relevant Orders   Vitamin B12 (Completed)   Basic metabolic panel (Completed)   COPD (chronic obstructive pulmonary disease) (HCC)    Breathing stable.        Essential hypertension    Blood pressure doing relatively well.  Same medication regimen.  Follow.        Relevant  Orders   TSH (Completed)   GERD (gastroesophageal reflux disease)  Controlled on protonix.        Relevant Medications   ondansetron (ZOFRAN-ODT) 4 MG disintegrating tablet   Hyperlipidemia    Low cholesterol diet and exercise.  Follow lipid panel.        Relevant Orders   Hepatic function panel (Completed)   Smoking    Discussed the need to stop smoking.  She desires to continue.  Follow.         Other Visit Diagnoses    Colon cancer screening       Relevant Orders   Ambulatory referral to General Surgery       Einar Pheasant, MD

## 2016-10-13 NOTE — Progress Notes (Signed)
Order placed for f/u stat potassium.  

## 2016-10-13 NOTE — Progress Notes (Signed)
Pre visit review using our clinic review tool, if applicable. No additional management support is needed unless otherwise documented below in the visit note. 

## 2016-10-15 ENCOUNTER — Ambulatory Visit (INDEPENDENT_AMBULATORY_CARE_PROVIDER_SITE_OTHER): Payer: Medicare HMO | Admitting: *Deleted

## 2016-10-15 ENCOUNTER — Other Ambulatory Visit (INDEPENDENT_AMBULATORY_CARE_PROVIDER_SITE_OTHER): Payer: Medicare HMO

## 2016-10-15 DIAGNOSIS — E875 Hyperkalemia: Secondary | ICD-10-CM | POA: Diagnosis not present

## 2016-10-15 DIAGNOSIS — E538 Deficiency of other specified B group vitamins: Secondary | ICD-10-CM | POA: Diagnosis not present

## 2016-10-15 LAB — POTASSIUM: POTASSIUM: 5.4 meq/L — AB (ref 3.5–5.1)

## 2016-10-15 MED ORDER — CYANOCOBALAMIN 1000 MCG/ML IJ SOLN
1000.0000 ug | Freq: Once | INTRAMUSCULAR | Status: AC
  Administered 2016-10-15: 1000 ug via INTRAMUSCULAR

## 2016-10-15 NOTE — Progress Notes (Addendum)
Patient presented for b 12 injection to left deltoid, patient voiced no concerns and showed no sign of distress during injection.  Reviewed.  Dr Nicki Reaper

## 2016-10-17 ENCOUNTER — Other Ambulatory Visit: Payer: Self-pay | Admitting: Internal Medicine

## 2016-10-17 DIAGNOSIS — E875 Hyperkalemia: Secondary | ICD-10-CM

## 2016-10-17 NOTE — Progress Notes (Signed)
Order placed for stat potassium to be drawn at [facility] 

## 2016-10-18 ENCOUNTER — Encounter: Payer: Self-pay | Admitting: Internal Medicine

## 2016-10-18 NOTE — Assessment & Plan Note (Signed)
Controlled on protonix.   

## 2016-10-18 NOTE — Assessment & Plan Note (Signed)
Doing better on citalopram.  Follow.

## 2016-10-18 NOTE — Assessment & Plan Note (Signed)
Monitoring her weight.  Has been stable without lasix.  Breathing better.  Stable.  Continue to f/u with cardiology.

## 2016-10-18 NOTE — Assessment & Plan Note (Signed)
Followed by nephrology.  Avoid antiinflammatories.  Follow metabolic panel.  Has f/u scheduled 11/2016.

## 2016-10-18 NOTE — Assessment & Plan Note (Signed)
Followed by cardiology 

## 2016-10-18 NOTE — Assessment & Plan Note (Signed)
Followed by cardiology.  Continue risk factor modification.  Currently asymptomatic.  Follow.

## 2016-10-18 NOTE — Assessment & Plan Note (Signed)
Breathing stable.

## 2016-10-18 NOTE — Assessment & Plan Note (Signed)
Blood pressure doing relatively well.  Same medication regimen.  Follow.

## 2016-10-18 NOTE — Assessment & Plan Note (Signed)
Low cholesterol diet and exercise.  Follow lipid panel.   

## 2016-10-18 NOTE — Assessment & Plan Note (Signed)
Discussed the need to stop smoking.  She desires to continue.  Follow.

## 2016-10-19 ENCOUNTER — Other Ambulatory Visit: Payer: Self-pay | Admitting: Internal Medicine

## 2016-10-19 ENCOUNTER — Other Ambulatory Visit: Payer: Self-pay | Admitting: Nurse Practitioner

## 2016-10-19 ENCOUNTER — Other Ambulatory Visit: Payer: Self-pay | Admitting: Cardiovascular Disease

## 2016-10-21 ENCOUNTER — Other Ambulatory Visit
Admission: RE | Admit: 2016-10-21 | Discharge: 2016-10-21 | Disposition: A | Discharge status: Home / Self Care | Payer: Medicare HMO | Source: Ambulatory Visit | Attending: Internal Medicine | Admitting: Internal Medicine

## 2016-10-21 DIAGNOSIS — E875 Hyperkalemia: Secondary | ICD-10-CM | POA: Diagnosis not present

## 2016-10-21 LAB — POTASSIUM: Potassium: 4.7 mmol/L (ref 3.5–5.1)

## 2016-10-22 ENCOUNTER — Ambulatory Visit (INDEPENDENT_AMBULATORY_CARE_PROVIDER_SITE_OTHER): Payer: Medicare HMO

## 2016-10-22 DIAGNOSIS — E538 Deficiency of other specified B group vitamins: Secondary | ICD-10-CM | POA: Diagnosis not present

## 2016-10-22 MED ORDER — CYANOCOBALAMIN 1000 MCG/ML IJ SOLN
1000.0000 ug | Freq: Once | INTRAMUSCULAR | Status: AC
  Administered 2016-10-22: 1000 ug via INTRAMUSCULAR

## 2016-10-22 NOTE — Progress Notes (Addendum)
Patient came in for second B12 injection.  Received in Right deltoid.  Patient tolerated well.   Reviewed.  Dr Nicki Reaper

## 2016-10-29 ENCOUNTER — Ambulatory Visit (INDEPENDENT_AMBULATORY_CARE_PROVIDER_SITE_OTHER): Payer: Medicare HMO

## 2016-10-29 DIAGNOSIS — E538 Deficiency of other specified B group vitamins: Secondary | ICD-10-CM

## 2016-10-29 MED ORDER — CYANOCOBALAMIN 1000 MCG/ML IJ SOLN
1000.0000 ug | Freq: Once | INTRAMUSCULAR | Status: AC
  Administered 2016-10-29: 1000 ug via INTRAMUSCULAR

## 2016-10-29 NOTE — Progress Notes (Addendum)
Patient came in for a B12 injections, received in Left deltoid.  Patient tolerated, no concerns.   Reviewed.  Dr Nicki Reaper

## 2016-11-03 ENCOUNTER — Other Ambulatory Visit: Payer: Self-pay | Admitting: Internal Medicine

## 2016-11-05 ENCOUNTER — Ambulatory Visit (INDEPENDENT_AMBULATORY_CARE_PROVIDER_SITE_OTHER): Payer: Medicare HMO

## 2016-11-05 DIAGNOSIS — E538 Deficiency of other specified B group vitamins: Secondary | ICD-10-CM

## 2016-11-05 MED ORDER — CYANOCOBALAMIN 1000 MCG/ML IJ SOLN
1000.0000 ug | Freq: Once | INTRAMUSCULAR | Status: AC
  Administered 2016-11-05: 1000 ug via INTRAMUSCULAR

## 2016-11-05 NOTE — Progress Notes (Addendum)
Patient came in for b12 injection.  Received in Right deltoid.  Patient tolerated well.   Reviewed.  Dr Nicki Reaper

## 2016-11-06 DIAGNOSIS — N183 Chronic kidney disease, stage 3 (moderate): Secondary | ICD-10-CM | POA: Diagnosis not present

## 2016-11-06 DIAGNOSIS — R809 Proteinuria, unspecified: Secondary | ICD-10-CM | POA: Diagnosis not present

## 2016-11-06 DIAGNOSIS — E875 Hyperkalemia: Secondary | ICD-10-CM | POA: Diagnosis not present

## 2016-11-06 DIAGNOSIS — I129 Hypertensive chronic kidney disease with stage 1 through stage 4 chronic kidney disease, or unspecified chronic kidney disease: Secondary | ICD-10-CM | POA: Diagnosis not present

## 2016-11-09 ENCOUNTER — Encounter: Payer: Self-pay | Admitting: *Deleted

## 2016-11-09 DIAGNOSIS — M47812 Spondylosis without myelopathy or radiculopathy, cervical region: Secondary | ICD-10-CM | POA: Diagnosis not present

## 2016-11-09 DIAGNOSIS — M170 Bilateral primary osteoarthritis of knee: Secondary | ICD-10-CM | POA: Diagnosis not present

## 2016-11-09 LAB — HEPATIC FUNCTION PANEL
ALK PHOS: 112 U/L (ref 25–125)
ALT: 13 U/L (ref 7–35)
AST: 14 U/L (ref 13–35)
BILIRUBIN, TOTAL: 0.9 mg/dL
Bilirubin, Direct: 0.42 mg/dL — AB (ref 0.01–0.4)

## 2016-11-09 LAB — CBC AND DIFFERENTIAL
HCT: 35 % — AB (ref 36–46)
HEMOGLOBIN: 11.7 g/dL — AB (ref 12.0–16.0)
Neutrophils Absolute: 75 /uL
Platelets: 234 10*3/uL (ref 150–399)
WBC: 5 10^3/mL

## 2016-11-09 LAB — BASIC METABOLIC PANEL
BUN: 30 mg/dL — AB (ref 4–21)
CREATININE: 1.5 mg/dL — AB (ref 0.5–1.1)
Glucose: 90 mg/dL
POTASSIUM: 4.8 mmol/L (ref 3.4–5.3)
SODIUM: 139 mmol/L (ref 137–147)

## 2016-11-09 LAB — VITAMIN D 25 HYDROXY (VIT D DEFICIENCY, FRACTURES): Vit D, 25-Hydroxy: 6.5

## 2016-11-10 DIAGNOSIS — M542 Cervicalgia: Secondary | ICD-10-CM | POA: Diagnosis not present

## 2016-11-10 DIAGNOSIS — M25561 Pain in right knee: Secondary | ICD-10-CM | POA: Diagnosis not present

## 2016-11-10 DIAGNOSIS — M170 Bilateral primary osteoarthritis of knee: Secondary | ICD-10-CM | POA: Diagnosis not present

## 2016-11-10 DIAGNOSIS — M25562 Pain in left knee: Secondary | ICD-10-CM | POA: Diagnosis not present

## 2016-11-12 ENCOUNTER — Ambulatory Visit (INDEPENDENT_AMBULATORY_CARE_PROVIDER_SITE_OTHER): Payer: Medicare HMO | Admitting: General Surgery

## 2016-11-12 ENCOUNTER — Encounter: Payer: Self-pay | Admitting: General Surgery

## 2016-11-12 VITALS — BP 118/70 | HR 70 | Resp 14 | Ht 65.0 in | Wt 137.0 lb

## 2016-11-12 DIAGNOSIS — Z86010 Personal history of colonic polyps: Secondary | ICD-10-CM

## 2016-11-12 DIAGNOSIS — K219 Gastro-esophageal reflux disease without esophagitis: Secondary | ICD-10-CM | POA: Diagnosis not present

## 2016-11-12 MED ORDER — POLYETHYLENE GLYCOL 3350 17 GM/SCOOP PO POWD: 1.0000 | Freq: Once | ORAL | 0 refills | Status: AC

## 2016-11-12 NOTE — Progress Notes (Signed)
Patient ID: Sandra Ellison, female   DOB: [birth year], 71 y.o.   MRN: [id number]  Chief Complaint  Patient presents with  . Colonoscopy    HPI Sandra Ellison is a 71 y.o. female here today for a evaluation of a colonoscopy. Last colonoscopy was done on 10/14/2010 by. Dr. Vira Agar. Moves her bowels every two day. She states she always has had constipation problems. She has try all kinds of stool softer and fiber.   The patient has a long history of gastroesophageal reflux. Rare dysphasia. History of iron deficiency anemia. Ongoing smoker. Significant other, Sandra Ellison is present for visit.  HPI  Past Medical History:  Diagnosis Date  . Allergy   . Arthritis   . Carotid arterial disease ([place])    a. 02/2015 Carotid U/S: RICA 19-62%, LICA 22-97%.  . Chronic combined systolic and diastolic CHF (congestive heart failure) ([place])    a. 01/2012 Echo: EF 30-35%, mod conc LVH, sev inf HK, mildly dil LA, mild-mod MR, Trace TR, RVSP 40-75mmHg.    Marland Kitchen Chronic kidney disease (CKD), stage II (mild)   . COPD (chronic obstructive pulmonary disease) ([place])   . Dilated cardiomyopathy ([place])    a. 02/2011 MV: EF 27%, anteroseptal, inf, inferolateral scar, no ischemia.  Marland Kitchen GERD (gastroesophageal reflux disease)   . History of abnormal Pap smear    a. s/p vaginal hysterectomy  . History of diverticulosis   . Hx of colonic polyps   . Hyperkalemia    a. 02/2015 in setting of entresto/AKI->entresto d/c'd.  . Hypertension   . Iron deficiency anemia    a. h/o transfusions, prev followed by heme/onc.  . Pulmonary HTN ([place])   . Stroke ([place])   . TIA (transient ischemic attack)   . Tobacco abuse     Past Surgical History:  Procedure Laterality Date  . CARDIAC CATHETERIZATION Left 08/05/2015   Procedure: Left Heart Cath and Coronary Angiography;  Surgeon: Minna Merritts, MD;  Location: [facility];  Service: Cardiovascular;  Laterality: Left;  . COLONOSCOPY  10/2010  . ORIF DISTAL RADIUS FRACTURE  2011    right  . TONSILLECTOMY  age 71  . VAGINAL HYSTERECTOMY  1981   secondary to abnormal pap smear    Family History  Problem Relation Age of Onset  . Arthritis Mother   . Alcohol abuse Father   . Arthritis Father   . Cancer Father        throat/spine  . Breast cancer Neg Hx     Social History Social History  Substance Use Topics  . Smoking status: Current Every Day Smoker    Packs/day: 0.50    Years: 42.00    Types: Cigarettes  . Smokeless tobacco: Never Used     Comment: Down to 3 cigarettes per day  . Alcohol use No    Allergies  Allergen Reactions  . Ranitidine Anaphylaxis  . Ranitidine Hcl Anaphylaxis and Swelling  . Entresto [Sacubitril-Valsartan] Other (See Comments)    Potassium issues    Current Outpatient Prescriptions  Medication Sig Dispense Refill  . albuterol (PROVENTIL) (2.5 MG/3ML) 0.083% nebulizer solution USE ONE TREATMENT EVERY 6 HOURS AS NEEDED 180 mL 1  . aspirin EC 81 MG tablet Take 81 mg by mouth daily.    Marland Kitchen atorvastatin (LIPITOR) 40 MG tablet TAKE 1 TABLET (40 MG TOTAL) BY MOUTH DAILY. 30 tablet 3  . carvedilol (COREG) 6.25 MG tablet Take 6.25 mg by mouth 2 (two) times daily with a meal.  3  . citalopram (CELEXA) 10 MG tablet TAKE 1 TABLET (10 MG TOTAL) BY MOUTH DAILY. 30 tablet 3  . clopidogrel (PLAVIX) 75 MG tablet TAKE 1 TABLET (75 MG TOTAL) BY MOUTH DAILY. 30 tablet 5  . Fe Fum-FePoly-Vit C-Vit B3 (INTEGRA) 62.5-62.5-40-3 MG CAPS One per day (Patient taking differently: Take 1 capsule by mouth daily. ) 30 capsule 2  . furosemide (LASIX) 20 MG tablet TAKE 1 TABLET (20 MG TOTAL) BY MOUTH DAILY AS NEEDED. 30 tablet 2  . furosemide (LASIX) 40 MG tablet Take 1 tablet (40 mg total) by mouth daily as needed. 90 tablet 3  . gabapentin (NEURONTIN) 100 MG capsule TAKE 1 CAPSULE (100 MG TOTAL) BY MOUTH 3 (THREE) TIMES DAILY AS NEEDED. 90 capsule 3  . gabapentin (NEURONTIN) 300 MG capsule Take 1 capsule (300 mg total) by mouth 3 (three) times daily. 270  capsule 1  . LORazepam (ATIVAN) 0.5 MG tablet Take 1 tablet (0.5 mg total) by mouth every 8 (eight) hours as needed for anxiety. 30 tablet 0  . ondansetron (ZOFRAN-ODT) 4 MG disintegrating tablet Take 1 tablet (4 mg total) by mouth 2 (two) times daily as needed for nausea or vomiting. 20 tablet 0  . pantoprazole (PROTONIX) 40 MG tablet TAKE 1 TABLET (40 MG TOTAL) BY MOUTH DAILY. 30 tablet 10  . SPIRIVA HANDIHALER 18 MCG inhalation capsule INHALE ONCE DAILY 30 capsule 5  . VENTOLIN HFA 108 (90 Base) MCG/ACT inhaler INHALE 2 PUFFS EVERY 6 HOURS AS NEEDED FOR WHEEZING OR SHORTNESS OF BREATH 18 Inhaler 3  . lisinopril (PRINIVIL,ZESTRIL) 5 MG tablet Take 1 tablet (5 mg total) by mouth daily. 90 tablet 3   No current facility-administered medications for this visit.     Review of Systems Review of Systems  Constitutional: Negative.   Respiratory: Negative.   Cardiovascular: Negative.   Gastrointestinal: Positive for constipation.    Blood pressure 118/70, pulse 70, resp. rate 14, height 5\' 5"  (1.651 m), weight 137 lb (62.1 kg).  Physical Exam Physical Exam  Constitutional: She is oriented to person, place, and time. She appears well-developed and well-nourished.  Eyes: Conjunctivae are normal. No scleral icterus.  Neck: Neck supple.  Cardiovascular: Normal rate, regular rhythm and normal heart sounds.   Mild left greater than right edema involving the area around the ankle, no extension to mid calf. Trace-1+.  Pulmonary/Chest: Effort normal and breath sounds normal.  Lymphadenopathy:    She has no cervical adenopathy.  Neurological: She is alert and oriented to person, place, and time.  Skin: Skin is warm and dry.    Data Reviewed 10/14/2010 colonoscopy completed by Gaylyn Cheers, M.D. reported a 10 mm polyp at hepatic flexure and several sessile polyps in the cecum. 2 sessile polyps in the rectum.  Diagnosis:  Part A: HEPATIC FLEXURE POLYP HOT SNARE:  TUBULAR ADENOMA, ONE  FRAGMENT.  NEGATIVE FOR HIGH GRADE DYSPLASIA AND MALIGNANCY.  .  Part B: MULTIPLE CECAL POLYPS HOT SNARE AND COLD BIOPSY:  TUBULAR ADENOMA, MULTIPLE FRAGMENTS.  NEGATIVE FOR HIGH GRADE DYSPLASIA AND MALIGNANCY.  .  Part C: ASCENDING COLON POLYP HOT SNARE:  TUBULAR ADENOMA, THREE FRAGMENTS.  NEGATIVE FOR HIGH GRADE DYSPLASIA AND MALIGNANCY.  .  Part D: HEPATIC FLEXURE POLYP HOT SNARE:  TUBULAR ADENOMA, THREE FRAGMENTS.  NEGATIVE FOR HIGH GRADE DYSPLASIA AND MALIGNANCY.  .  Part E: RECTAL POLYP COLD BIOPSY X 2:  HYPERPLASTIC POLYP, TWO FRAGMENTS.  BENIGN LYMPHOID AGGREGATE NOTED.  NEGATIVE FOR DYSPLASIA AND MALIGNANCY.  Marland Kitchen  01/16/2015 mammogram and ultrasound showed stable calcifications in the right breast and a questionable distortion of the left thought to represent a complex cyst per which a 6 month follow-up was recommended.  Laboratory studies dated 11/09/2016 showed a hemoglobin of 11.7 with a platelet count 234,000. Basic metabolic panel showed a creatinine of 1.5 with normal electrolytes. Liver function studies were normal.  Cardiology note of 09/07/2016 reviewed. Chronic heart failure with an estimated ejection fraction 25%. Difficult management of fluid status. Stable chronic renal insufficiency. Past history peptic ulcer.  Previous transfusion for anemia in 2012. Assessment    Past history multiple colonic tubular adenomas for which follow-up endoscopy is appropriate.  Overdue for follow-up mammogram regarding suspected left breast cyst.    Plan     The patient will need to discontinue her Plavix 5 days prior to the procedure. She will continue her aspirin therapy. There is a slight risk of recurrent stroke while off Plavix and she is aware of this.  With her history of chronic anemia and past ulcer disease, as well as reflux symptoms and ongoing smoking upper endoscopy is recommended at the same setting.  With her history of chronic constipation we will augment the  standard colonic prep.      Colonoscopy with possible biopsy/polypectomy prn: Information regarding the procedure, including its potential risks and complications (including but not limited to perforation of the bowel, which may require emergency surgery to repair, and bleeding) was verbally given to the patient. Educational information regarding lower intestinal endoscopy was given to the patient. Written instructions for how to complete the bowel prep using Miralax were provided. The importance of drinking ample fluids to avoid dehydration as a result of the prep emphasized.  This patient has been asked to take Dulcolax 2 tablets two days prior and one tablet one day prior to colonoscopy in addition to Miralax prep.    HPI, Physical Exam, Assessment and Plan have been scribed under the direction and in the presence of Hervey Ard, MD.  Gaspar Cola, CMA  I have completed the exam and reviewed the above documentation for accuracy and completeness.  I agree with the above.  Haematologist has been used and any errors in dictation or transcription are unintentional.  Hervey Ard, M.D., F.A.C.S.  The patient is scheduled for an Upper Endoscopy and Colonoscopy at [facility] on 12/15/16. They are aware to call the day before to get their arrival time. She will stop her Plavix 5 days prior. She may continue her 81 mg Aspirin. She will take 2 Dulcolax tablets 2 days prior and 1 Dulcolax tablet 1 day prior. She will only take her blood pressure and heart medication the day of by 8 am. Miralax prescription has been sent into the patient's pharmacy. The patient is aware of date and instructions.  Documented by Lesly Rubenstein LPN  I have completed the exam and reviewed the above documentation for accuracy and completeness.  I agree with the above.  Haematologist has been used and any errors in dictation or transcription are unintentional.  Hervey Ard, M.D., F.A.C.S.   [name] 11/14/2016, 7:22 AM

## 2016-11-12 NOTE — Patient Instructions (Addendum)
Colonoscopy, Adult A colonoscopy is an exam to look at the entire large intestine. During the exam, a lubricated, bendable tube is inserted into the anus and then passed into the rectum, colon, and other parts of the large intestine. A colonoscopy is often done as a part of normal colorectal screening or in response to certain symptoms, such as anemia, persistent diarrhea, abdominal pain, and blood in the stool. The exam can help screen for and diagnose medical problems, including:  Tumors.  Polyps.  Inflammation.  Areas of bleeding.  Tell a health care provider about:  Any allergies you have.  All medicines you are taking, including vitamins, herbs, eye drops, creams, and over-the-counter medicines.  Any problems you or family members have had with anesthetic medicines.  Any blood disorders you have.  Any surgeries you have had.  Any medical conditions you have.  Any problems you have had passing stool. What are the risks? Generally, this is a safe procedure. However, problems may occur, including:  Bleeding.  A tear in the intestine.  A reaction to medicines given during the exam.  Infection (rare).  What happens before the procedure? Eating and drinking restrictions Follow instructions from your health care provider about eating and drinking, which may include:  A few days before the procedure - follow a low-fiber diet. Avoid nuts, seeds, dried fruit, raw fruits, and vegetables.  1-3 days before the procedure - follow a clear liquid diet. Drink only clear liquids, such as clear broth or bouillon, black coffee or tea, clear juice, clear soft drinks or sports drinks, gelatin dessert, and popsicles. Avoid any liquids that contain red or purple dye.  On the day of the procedure - do not eat or drink anything during the 2 hours before the procedure, or within the time period that your health care provider recommends.  Bowel prep If you were prescribed an oral bowel prep  to clean out your colon:  Take it as told by your health care provider. Starting the day before your procedure, you will need to drink a large amount of medicated liquid. The liquid will cause you to have multiple loose stools until your stool is almost clear or light green.  If your skin or anus gets irritated from diarrhea, you may use these to relieve the irritation: ? Medicated wipes, such as adult wet wipes with aloe and vitamin E. ? A skin soothing-product like petroleum jelly.  If you vomit while drinking the bowel prep, take a break for up to 60 minutes and then begin the bowel prep again. If vomiting continues and you cannot take the bowel prep without vomiting, call your health care provider.  General instructions  Ask your health care provider about changing or stopping your regular medicines. This is especially important if you are taking diabetes medicines or blood thinners.  Plan to have someone take you home from the hospital or clinic. What happens during the procedure?  An IV tube may be inserted into one of your veins.  You will be given medicine to help you relax (sedative).  To reduce your risk of infection: ? Your health care team will wash or sanitize their hands. ? Your anal area will be washed with soap.  You will be asked to lie on your side with your knees bent.  Your health care provider will lubricate a long, thin, flexible tube. The tube will have a camera and a light on the end.  The tube will be inserted into your   anus.  The tube will be gently eased through your rectum and colon.  Air will be delivered into your colon to keep it open. You may feel some pressure or cramping.  The camera will be used to take images during the procedure.  A small tissue sample may be removed from your body to be examined under a microscope (biopsy). If any potential problems are found, the tissue will be sent to a lab for testing.  If small polyps are found, your  health care provider may remove them and have them checked for cancer cells.  The tube that was inserted into your anus will be slowly removed. The procedure may vary among health care providers and hospitals. What happens after the procedure?  Your blood pressure, heart rate, breathing rate, and blood oxygen level will be monitored until the medicines you were given have worn off.  Do not drive for 24 hours after the exam.  You may have a small amount of blood in your stool.  You may pass gas and have mild abdominal cramping or bloating due to the air that was used to inflate your colon during the exam.  It is up to you to get the results of your procedure. Ask your health care provider, or the department performing the procedure, when your results will be ready. This information is not intended to replace advice given to you by your health care provider. Make sure you discuss any questions you have with your health care provider. Document Released: 05/22/2000 Document Revised: 03/25/2016 Document Reviewed: 08/06/2015 Elsevier Interactive Patient Education  [employer].  The patient is scheduled for an Upper Endoscopy and Colonoscopy at [facility] on 12/15/16. They are aware to call the day before to get their arrival time. She will stop her Plavix 5 days prior. She may continue her 81 mg Aspirin. She will take 2 Dulcolax tablets 2 days prior and 1 Dulcolax tablet 1 day prior. She will only take her blood pressure and heart medication the day of by 8 am. Miralax prescription has been sent into the patient's pharmacy. The patient is aware of date and instructions.

## 2016-11-17 DIAGNOSIS — N183 Chronic kidney disease, stage 3 (moderate): Secondary | ICD-10-CM | POA: Diagnosis not present

## 2016-11-17 DIAGNOSIS — M542 Cervicalgia: Secondary | ICD-10-CM | POA: Diagnosis not present

## 2016-11-17 DIAGNOSIS — M170 Bilateral primary osteoarthritis of knee: Secondary | ICD-10-CM | POA: Diagnosis not present

## 2016-11-19 ENCOUNTER — Telehealth: Payer: Self-pay | Admitting: Cardiovascular Disease

## 2016-11-19 NOTE — Telephone Encounter (Signed)
Patient has upcoming colonoscopy in July and wants to ask questions about drinking prep that's 64 oz as she has CHF

## 2016-11-19 NOTE — Telephone Encounter (Signed)
Pt is sched for colonoscopy on 12/15/16. She has been provided w/ prep that is 64 oz. She has been repeatedly advised to limit her fluids and is concerned about drinking that much fluid. Advised her that this fluid will be used to flush her colon and will not be in circulation. She is appreciative and will call back w/ any further questions or concerns.

## 2016-11-24 DIAGNOSIS — M47812 Spondylosis without myelopathy or radiculopathy, cervical region: Secondary | ICD-10-CM | POA: Diagnosis not present

## 2016-11-24 DIAGNOSIS — M25561 Pain in right knee: Secondary | ICD-10-CM | POA: Diagnosis not present

## 2016-11-24 DIAGNOSIS — M25562 Pain in left knee: Secondary | ICD-10-CM | POA: Diagnosis not present

## 2016-11-30 ENCOUNTER — Telehealth: Payer: Self-pay | Admitting: Internal Medicine

## 2016-11-30 DIAGNOSIS — E559 Vitamin D deficiency, unspecified: Secondary | ICD-10-CM | POA: Diagnosis not present

## 2016-11-30 DIAGNOSIS — R809 Proteinuria, unspecified: Secondary | ICD-10-CM | POA: Diagnosis not present

## 2016-11-30 DIAGNOSIS — N183 Chronic kidney disease, stage 3 (moderate): Secondary | ICD-10-CM | POA: Diagnosis not present

## 2016-11-30 DIAGNOSIS — E1122 Type 2 diabetes mellitus with diabetic chronic kidney disease: Secondary | ICD-10-CM | POA: Diagnosis not present

## 2016-11-30 DIAGNOSIS — I129 Hypertensive chronic kidney disease with stage 1 through stage 4 chronic kidney disease, or unspecified chronic kidney disease: Secondary | ICD-10-CM | POA: Diagnosis not present

## 2016-11-30 DIAGNOSIS — N2581 Secondary hyperparathyroidism of renal origin: Secondary | ICD-10-CM | POA: Diagnosis not present

## 2016-11-30 DIAGNOSIS — E875 Hyperkalemia: Secondary | ICD-10-CM | POA: Diagnosis not present

## 2016-11-30 NOTE — Telephone Encounter (Signed)
Last refill 10/13/16 # 20 ok to refill?

## 2016-12-01 NOTE — Telephone Encounter (Signed)
I am ok to refill x 1, but if she is needing this medication on average every other day, she needs to be reevaluated.

## 2016-12-02 NOTE — Telephone Encounter (Signed)
Left message to return call to our office.  

## 2016-12-03 DIAGNOSIS — M5412 Radiculopathy, cervical region: Secondary | ICD-10-CM | POA: Diagnosis not present

## 2016-12-03 DIAGNOSIS — M25562 Pain in left knee: Secondary | ICD-10-CM | POA: Diagnosis not present

## 2016-12-03 DIAGNOSIS — M25561 Pain in right knee: Secondary | ICD-10-CM | POA: Diagnosis not present

## 2016-12-03 NOTE — Telephone Encounter (Signed)
Notes reviewed.  ?

## 2016-12-03 NOTE — Telephone Encounter (Signed)
Spoke to patient she is aware if she is taking that much that she needs to be seen for evaluation. She also wanted to make sure you seen the notes from [place] from 6/22 I have not seen them will call and get sent for review

## 2016-12-03 NOTE — Telephone Encounter (Signed)
Left message to return call to our office.  

## 2016-12-03 NOTE — Telephone Encounter (Signed)
Pt called back returning your call. Please advise, thank you!  Call pt @ [phone]

## 2016-12-03 NOTE — Telephone Encounter (Signed)
Notes received put in your box green folder for review.

## 2016-12-07 DIAGNOSIS — R809 Proteinuria, unspecified: Secondary | ICD-10-CM | POA: Diagnosis not present

## 2016-12-07 DIAGNOSIS — N2581 Secondary hyperparathyroidism of renal origin: Secondary | ICD-10-CM | POA: Diagnosis not present

## 2016-12-07 DIAGNOSIS — M5412 Radiculopathy, cervical region: Secondary | ICD-10-CM | POA: Diagnosis not present

## 2016-12-07 DIAGNOSIS — M170 Bilateral primary osteoarthritis of knee: Secondary | ICD-10-CM | POA: Diagnosis not present

## 2016-12-07 DIAGNOSIS — E875 Hyperkalemia: Secondary | ICD-10-CM | POA: Diagnosis not present

## 2016-12-07 DIAGNOSIS — N183 Chronic kidney disease, stage 3 (moderate): Secondary | ICD-10-CM | POA: Diagnosis not present

## 2016-12-07 DIAGNOSIS — E1129 Type 2 diabetes mellitus with other diabetic kidney complication: Secondary | ICD-10-CM | POA: Diagnosis not present

## 2016-12-07 DIAGNOSIS — E1122 Type 2 diabetes mellitus with diabetic chronic kidney disease: Secondary | ICD-10-CM | POA: Diagnosis not present

## 2016-12-07 DIAGNOSIS — E559 Vitamin D deficiency, unspecified: Secondary | ICD-10-CM | POA: Diagnosis not present

## 2016-12-08 ENCOUNTER — Ambulatory Visit (INDEPENDENT_AMBULATORY_CARE_PROVIDER_SITE_OTHER): Payer: Medicare HMO | Admitting: *Deleted

## 2016-12-08 DIAGNOSIS — E538 Deficiency of other specified B group vitamins: Secondary | ICD-10-CM | POA: Diagnosis not present

## 2016-12-08 MED ORDER — CYANOCOBALAMIN 1000 MCG/ML IJ SOLN
1000.0000 ug | Freq: Once | INTRAMUSCULAR | Status: AC
  Administered 2016-12-08: 1000 ug via INTRAMUSCULAR

## 2016-12-08 NOTE — Progress Notes (Addendum)
Patient presented for B 12 injection to left deltoid, patient voiced no concerns nor showed any signs of distress during injection.  Reviewed.  Dr Scott 

## 2016-12-10 DIAGNOSIS — M5412 Radiculopathy, cervical region: Secondary | ICD-10-CM | POA: Diagnosis not present

## 2016-12-10 DIAGNOSIS — M170 Bilateral primary osteoarthritis of knee: Secondary | ICD-10-CM | POA: Diagnosis not present

## 2016-12-15 ENCOUNTER — Observation Stay
Admission: RE | Admit: 2016-12-15 | Discharge: 2016-12-16 | Disposition: A | Discharge status: Home / Self Care | Payer: Medicare HMO | Source: Ambulatory Visit | Attending: Internal Medicine | Admitting: Internal Medicine

## 2016-12-15 ENCOUNTER — Ambulatory Visit: Payer: Medicare HMO | Admitting: Anesthesiology

## 2016-12-15 ENCOUNTER — Ambulatory Visit: Payer: Medicare HMO

## 2016-12-15 ENCOUNTER — Encounter
Admission: RE | Disposition: A | Discharge status: Home / Self Care | Payer: Self-pay | Source: Ambulatory Visit | Attending: Internal Medicine

## 2016-12-15 ENCOUNTER — Encounter: Payer: Self-pay | Admitting: *Deleted

## 2016-12-15 DIAGNOSIS — R69 Illness, unspecified: Secondary | ICD-10-CM | POA: Diagnosis not present

## 2016-12-15 DIAGNOSIS — I5023 Acute on chronic systolic (congestive) heart failure: Secondary | ICD-10-CM | POA: Diagnosis not present

## 2016-12-15 DIAGNOSIS — D125 Benign neoplasm of sigmoid colon: Secondary | ICD-10-CM | POA: Insufficient documentation

## 2016-12-15 DIAGNOSIS — Z79899 Other long term (current) drug therapy: Secondary | ICD-10-CM | POA: Diagnosis not present

## 2016-12-15 DIAGNOSIS — F17210 Nicotine dependence, cigarettes, uncomplicated: Secondary | ICD-10-CM | POA: Insufficient documentation

## 2016-12-15 DIAGNOSIS — N182 Chronic kidney disease, stage 2 (mild): Secondary | ICD-10-CM | POA: Insufficient documentation

## 2016-12-15 DIAGNOSIS — I420 Dilated cardiomyopathy: Secondary | ICD-10-CM | POA: Insufficient documentation

## 2016-12-15 DIAGNOSIS — I272 Pulmonary hypertension, unspecified: Secondary | ICD-10-CM | POA: Diagnosis not present

## 2016-12-15 DIAGNOSIS — K228 Other specified diseases of esophagus: Secondary | ICD-10-CM | POA: Insufficient documentation

## 2016-12-15 DIAGNOSIS — D120 Benign neoplasm of cecum: Secondary | ICD-10-CM | POA: Diagnosis not present

## 2016-12-15 DIAGNOSIS — I739 Peripheral vascular disease, unspecified: Secondary | ICD-10-CM | POA: Insufficient documentation

## 2016-12-15 DIAGNOSIS — I2510 Atherosclerotic heart disease of native coronary artery without angina pectoris: Secondary | ICD-10-CM | POA: Insufficient documentation

## 2016-12-15 DIAGNOSIS — J449 Chronic obstructive pulmonary disease, unspecified: Secondary | ICD-10-CM | POA: Insufficient documentation

## 2016-12-15 DIAGNOSIS — K209 Esophagitis, unspecified: Secondary | ICD-10-CM | POA: Diagnosis not present

## 2016-12-15 DIAGNOSIS — D124 Benign neoplasm of descending colon: Secondary | ICD-10-CM | POA: Insufficient documentation

## 2016-12-15 DIAGNOSIS — Z86010 Personal history of colonic polyps: Secondary | ICD-10-CM | POA: Diagnosis not present

## 2016-12-15 DIAGNOSIS — I5042 Chronic combined systolic (congestive) and diastolic (congestive) heart failure: Secondary | ICD-10-CM | POA: Diagnosis not present

## 2016-12-15 DIAGNOSIS — K219 Gastro-esophageal reflux disease without esophagitis: Secondary | ICD-10-CM

## 2016-12-15 DIAGNOSIS — I110 Hypertensive heart disease with heart failure: Secondary | ICD-10-CM | POA: Diagnosis not present

## 2016-12-15 DIAGNOSIS — F419 Anxiety disorder, unspecified: Secondary | ICD-10-CM | POA: Diagnosis not present

## 2016-12-15 DIAGNOSIS — I130 Hypertensive heart and chronic kidney disease with heart failure and stage 1 through stage 4 chronic kidney disease, or unspecified chronic kidney disease: Secondary | ICD-10-CM | POA: Diagnosis not present

## 2016-12-15 DIAGNOSIS — M25511 Pain in right shoulder: Secondary | ICD-10-CM | POA: Diagnosis not present

## 2016-12-15 DIAGNOSIS — K2950 Unspecified chronic gastritis without bleeding: Secondary | ICD-10-CM | POA: Diagnosis not present

## 2016-12-15 DIAGNOSIS — K2970 Gastritis, unspecified, without bleeding: Secondary | ICD-10-CM | POA: Diagnosis not present

## 2016-12-15 DIAGNOSIS — Z7982 Long term (current) use of aspirin: Secondary | ICD-10-CM | POA: Insufficient documentation

## 2016-12-15 DIAGNOSIS — K210 Gastro-esophageal reflux disease with esophagitis: Secondary | ICD-10-CM | POA: Diagnosis not present

## 2016-12-15 DIAGNOSIS — I509 Heart failure, unspecified: Secondary | ICD-10-CM | POA: Diagnosis not present

## 2016-12-15 DIAGNOSIS — D123 Benign neoplasm of transverse colon: Secondary | ICD-10-CM | POA: Diagnosis not present

## 2016-12-15 DIAGNOSIS — I6930 Unspecified sequelae of cerebral infarction: Secondary | ICD-10-CM | POA: Diagnosis not present

## 2016-12-15 DIAGNOSIS — R339 Retention of urine, unspecified: Secondary | ICD-10-CM | POA: Diagnosis not present

## 2016-12-15 DIAGNOSIS — J9601 Acute respiratory failure with hypoxia: Secondary | ICD-10-CM | POA: Diagnosis not present

## 2016-12-15 DIAGNOSIS — K2960 Other gastritis without bleeding: Secondary | ICD-10-CM | POA: Diagnosis not present

## 2016-12-15 DIAGNOSIS — R0902 Hypoxemia: Secondary | ICD-10-CM | POA: Diagnosis present

## 2016-12-15 DIAGNOSIS — Z7902 Long term (current) use of antithrombotics/antiplatelets: Secondary | ICD-10-CM | POA: Insufficient documentation

## 2016-12-15 DIAGNOSIS — D509 Iron deficiency anemia, unspecified: Secondary | ICD-10-CM | POA: Diagnosis not present

## 2016-12-15 DIAGNOSIS — J9600 Acute respiratory failure, unspecified whether with hypoxia or hypercapnia: Secondary | ICD-10-CM | POA: Diagnosis present

## 2016-12-15 DIAGNOSIS — G8929 Other chronic pain: Secondary | ICD-10-CM | POA: Insufficient documentation

## 2016-12-15 DIAGNOSIS — K635 Polyp of colon: Secondary | ICD-10-CM | POA: Diagnosis not present

## 2016-12-15 DIAGNOSIS — I517 Cardiomegaly: Secondary | ICD-10-CM | POA: Diagnosis not present

## 2016-12-15 DIAGNOSIS — R0682 Tachypnea, not elsewhere classified: Secondary | ICD-10-CM

## 2016-12-15 HISTORY — PX: COLONOSCOPY WITH PROPOFOL: SHX5780

## 2016-12-15 HISTORY — PX: ESOPHAGOGASTRODUODENOSCOPY (EGD) WITH PROPOFOL: SHX5813

## 2016-12-15 LAB — CBC WITH DIFFERENTIAL/PLATELET
BASOS PCT: 0 %
Basophils Absolute: 0 10*3/uL (ref 0–0.1)
EOS ABS: 0 10*3/uL (ref 0–0.7)
Eosinophils Relative: 0 %
HEMATOCRIT: 41.5 % (ref 35.0–47.0)
HEMOGLOBIN: 14.1 g/dL (ref 12.0–16.0)
Lymphocytes Relative: 6 %
Lymphs Abs: 0.4 10*3/uL — ABNORMAL LOW (ref 1.0–3.6)
MCH: 32.8 pg (ref 26.0–34.0)
MCHC: 33.9 g/dL (ref 32.0–36.0)
MCV: 96.6 fL (ref 80.0–100.0)
Monocytes Absolute: 0.7 10*3/uL (ref 0.2–0.9)
Monocytes Relative: 10 %
NEUTROS ABS: 5.8 10*3/uL (ref 1.4–6.5)
NEUTROS PCT: 84 %
Platelets: 175 10*3/uL (ref 150–440)
RBC: 4.3 MIL/uL (ref 3.80–5.20)
RDW: 14.2 % (ref 11.5–14.5)
WBC: 6.9 10*3/uL (ref 3.6–11.0)

## 2016-12-15 LAB — BLOOD GAS, ARTERIAL
ACID-BASE DEFICIT: 4.1 mmol/L — AB (ref 0.0–2.0)
Bicarbonate: 21.6 mmol/L (ref 20.0–28.0)
DELIVERY SYSTEMS: POSITIVE
Expiratory PAP: 6
FIO2: 0.5
INSPIRATORY PAP: 12
O2 Saturation: 95.3 %
PATIENT TEMPERATURE: 37
pCO2 arterial: 41 mmHg (ref 32.0–48.0)
pH, Arterial: 7.33 — ABNORMAL LOW (ref 7.350–7.450)
pO2, Arterial: 83 mmHg (ref 83.0–108.0)

## 2016-12-15 LAB — COMPREHENSIVE METABOLIC PANEL
ALBUMIN: 3.7 g/dL (ref 3.5–5.0)
ALK PHOS: 101 U/L (ref 38–126)
ALT: 25 U/L (ref 14–54)
AST: 26 U/L (ref 15–41)
Anion gap: 11 (ref 5–15)
BUN: 36 mg/dL — AB (ref 6–20)
CALCIUM: 9 mg/dL (ref 8.9–10.3)
CO2: 23 mmol/L (ref 22–32)
CREATININE: 1.83 mg/dL — AB (ref 0.44–1.00)
Chloride: 102 mmol/L (ref 101–111)
GFR calc Af Amer: 31 mL/min — ABNORMAL LOW (ref >60)
GFR calc non Af Amer: 27 mL/min — ABNORMAL LOW (ref >60)
GLUCOSE: 90 mg/dL (ref 65–99)
Potassium: 3.9 mmol/L (ref 3.5–5.1)
SODIUM: 136 mmol/L (ref 135–145)
Total Bilirubin: 1.9 mg/dL — ABNORMAL HIGH (ref 0.3–1.2)
Total Protein: 6.2 g/dL — ABNORMAL LOW (ref 6.5–8.1)

## 2016-12-15 LAB — TROPONIN I: Troponin I: <0.03 ng/mL (ref <0.03)

## 2016-12-15 LAB — LACTIC ACID, PLASMA
Lactic Acid, Venous: 1.2 mmol/L (ref 0.5–1.9)
Lactic Acid, Venous: 1.3 mmol/L (ref 0.5–1.9)

## 2016-12-15 SURGERY — COLONOSCOPY WITH PROPOFOL
Anesthesia: General

## 2016-12-15 MED ORDER — TRAMADOL HCL 50 MG PO TABS: 50.0000 mg | ORAL_TABLET | Freq: Four times a day (QID) | ORAL | Status: DC | PRN

## 2016-12-15 MED ORDER — ONDANSETRON HCL 4 MG PO TABS: 4.0000 mg | ORAL_TABLET | Freq: Four times a day (QID) | ORAL | Status: DC | PRN

## 2016-12-15 MED ORDER — GLYCOPYRROLATE 0.2 MG/ML IJ SOLN
INTRAMUSCULAR | Status: DC | PRN
  Administered 2016-12-15: 0.2 mg via INTRAVENOUS

## 2016-12-15 MED ORDER — MORPHINE SULFATE (PF) 4 MG/ML IV SOLN
1.0000 mg | Freq: Once | INTRAVENOUS | Status: AC
  Administered 2016-12-15: 1 mg via INTRAVENOUS

## 2016-12-15 MED ORDER — EPHEDRINE SULFATE 50 MG/ML IJ SOLN
INTRAMUSCULAR | Status: DC | PRN
  Administered 2016-12-15: 10 mg via INTRAVENOUS

## 2016-12-15 MED ORDER — MORPHINE SULFATE (PF) 4 MG/ML IV SOLN: 2.0000 mg | INTRAVENOUS | Status: DC | PRN

## 2016-12-15 MED ORDER — ONDANSETRON HCL 4 MG/2ML IJ SOLN
INTRAMUSCULAR | Status: DC | PRN
  Administered 2016-12-15: 4 mg via INTRAVENOUS

## 2016-12-15 MED ORDER — GLYCOPYRROLATE 0.2 MG/ML IJ SOLN
INTRAMUSCULAR | Status: AC
  Filled 2016-12-15: qty 1

## 2016-12-15 MED ORDER — SODIUM CHLORIDE 0.9% FLUSH
3.0000 mL | Freq: Two times a day (BID) | INTRAVENOUS | Status: DC
  Administered 2016-12-15 – 2016-12-16 (×2): 3 mL via INTRAVENOUS

## 2016-12-15 MED ORDER — IPRATROPIUM-ALBUTEROL 0.5-2.5 (3) MG/3ML IN SOLN: 3.0000 mL | Freq: Four times a day (QID) | RESPIRATORY_TRACT | Status: DC

## 2016-12-15 MED ORDER — ALBUTEROL SULFATE (2.5 MG/3ML) 0.083% IN NEBU: 2.5000 mg | INHALATION_SOLUTION | RESPIRATORY_TRACT | Status: DC | PRN

## 2016-12-15 MED ORDER — FLUMAZENIL 0.5 MG/5ML IV SOLN
INTRAVENOUS | Status: DC | PRN
  Administered 2016-12-15 (×2): .1 mg via INTRAVENOUS

## 2016-12-15 MED ORDER — PROPOFOL 500 MG/50ML IV EMUL
INTRAVENOUS | Status: AC
  Filled 2016-12-15: qty 50

## 2016-12-15 MED ORDER — ACETAMINOPHEN 650 MG RE SUPP: 650.0000 mg | Freq: Four times a day (QID) | RECTAL | Status: DC | PRN

## 2016-12-15 MED ORDER — FLUMAZENIL 0.5 MG/5ML IV SOLN
INTRAVENOUS | Status: AC
  Filled 2016-12-15: qty 5

## 2016-12-15 MED ORDER — IPRATROPIUM-ALBUTEROL 0.5-2.5 (3) MG/3ML IN SOLN
3.0000 mL | Freq: Four times a day (QID) | RESPIRATORY_TRACT | Status: DC
  Administered 2016-12-15 – 2016-12-16 (×5): 3 mL via RESPIRATORY_TRACT
  Filled 2016-12-15 (×5): qty 3

## 2016-12-15 MED ORDER — IPRATROPIUM-ALBUTEROL 0.5-2.5 (3) MG/3ML IN SOLN
3.0000 mL | Freq: Once | RESPIRATORY_TRACT | Status: AC
  Administered 2016-12-15: 3 mL via RESPIRATORY_TRACT
  Filled 2016-12-15: qty 3

## 2016-12-15 MED ORDER — ONDANSETRON HCL 4 MG/2ML IJ SOLN: 4.0000 mg | Freq: Four times a day (QID) | INTRAMUSCULAR | Status: DC | PRN

## 2016-12-15 MED ORDER — ETOMIDATE 2 MG/ML IV SOLN
INTRAVENOUS | Status: AC
  Filled 2016-12-15: qty 10

## 2016-12-15 MED ORDER — PHENYLEPHRINE HCL 10 MG/ML IJ SOLN
INTRAMUSCULAR | Status: DC | PRN
  Administered 2016-12-15: 100 ug via INTRAVENOUS
  Administered 2016-12-15: 200 ug via INTRAVENOUS

## 2016-12-15 MED ORDER — ETOMIDATE 2 MG/ML IV SOLN
INTRAVENOUS | Status: DC | PRN
  Administered 2016-12-15: 12 mg via INTRAVENOUS

## 2016-12-15 MED ORDER — SODIUM CHLORIDE 0.9 % IV SOLN
INTRAVENOUS | Status: DC
  Administered 2016-12-15: 14:00:00 via INTRAVENOUS

## 2016-12-15 MED ORDER — MIDAZOLAM HCL 2 MG/2ML IJ SOLN
INTRAMUSCULAR | Status: AC
  Filled 2016-12-15: qty 2

## 2016-12-15 MED ORDER — ENOXAPARIN SODIUM 30 MG/0.3ML ~~LOC~~ SOLN: 30.0000 mg | SUBCUTANEOUS | Status: DC

## 2016-12-15 MED ORDER — METHYLPREDNISOLONE SODIUM SUCC 125 MG IJ SOLR
60.0000 mg | Freq: Every day | INTRAMUSCULAR | Status: DC
  Administered 2016-12-15 – 2016-12-16 (×2): 60 mg via INTRAVENOUS
  Filled 2016-12-15: qty 2

## 2016-12-15 MED ORDER — ACETAMINOPHEN 325 MG PO TABS
650.0000 mg | ORAL_TABLET | Freq: Four times a day (QID) | ORAL | Status: DC | PRN
  Administered 2016-12-16 (×2): 650 mg via ORAL
  Filled 2016-12-15 (×2): qty 2

## 2016-12-15 MED ORDER — ESMOLOL HCL 100 MG/10ML IV SOLN
INTRAVENOUS | Status: AC
  Filled 2016-12-15: qty 10

## 2016-12-15 MED ORDER — POLYETHYLENE GLYCOL 3350 17 G PO PACK: 17.0000 g | PACK | Freq: Every day | ORAL | Status: DC | PRN

## 2016-12-15 MED ORDER — ORAL CARE MOUTH RINSE
15.0000 mL | Freq: Two times a day (BID) | OROMUCOSAL | Status: DC
  Administered 2016-12-15: 15 mL via OROMUCOSAL

## 2016-12-15 MED ORDER — LIDOCAINE HCL (PF) 2 % IJ SOLN
INTRAMUSCULAR | Status: AC
  Filled 2016-12-15: qty 2

## 2016-12-15 MED ORDER — MIDAZOLAM HCL 2 MG/2ML IJ SOLN
INTRAMUSCULAR | Status: DC | PRN
  Administered 2016-12-15: 1 mg via INTRAVENOUS

## 2016-12-15 MED ORDER — ONDANSETRON HCL 4 MG/2ML IJ SOLN
INTRAMUSCULAR | Status: AC
  Filled 2016-12-15: qty 2

## 2016-12-15 MED ORDER — PROPOFOL 500 MG/50ML IV EMUL
INTRAVENOUS | Status: DC | PRN
  Administered 2016-12-15: 55 ug/kg/min via INTRAVENOUS

## 2016-12-15 NOTE — Op Note (Signed)
[facility] Gastroenterology Patient Name: Sandra Ellison Procedure Date: 12/15/2016 1:46 PM MRN: [id number] Account #: [id number] Date of Birth: 10/21/1945 Admit Type: Outpatient Age: 71 Room: [facility] ENDO ROOM 1 Gender: Female Note Status: Finalized Procedure:            Upper GI endoscopy Indications:          Iron deficiency anemia Providers:            [name], MD Referring MD:         Einar Pheasant, MD (Referring MD) Medicines:            Monitored Anesthesia Care Complications:        No immediate complications. Procedure:            Pre-Anesthesia Assessment:                       - Prior to the procedure, a History and Physical was                        performed, and patient medications, allergies and                        sensitivities were reviewed. The patient's tolerance of                        previous anesthesia was reviewed.                       - The risks and benefits of the procedure and the                        sedation options and risks were discussed with the                        patient. All questions were answered and informed                        consent was obtained.                       After obtaining informed consent, the endoscope was                        passed under direct vision. Throughout the procedure,                        the patient's blood pressure, pulse, and oxygen                        saturations were monitored continuously. The Endoscope                        was introduced through the mouth, and advanced to the                        fourth part of duodenum. The upper GI endoscopy was                        accomplished without difficulty. The patient tolerated  the procedure well. Findings:      LA Grade A (one or more mucosal breaks less than 5 mm, not extending       between tops of 2 mucosal folds) esophagitis with no bleeding was found       38 to 40 cm from the  incisors. Biopsies were taken with a cold forceps       for histology.      Diffuse mild inflammation characterized by erythema was found in the       gastric antrum. Biopsies were taken with a cold forceps for histology.      The examined duodenum was normal.      A single 4 mm polyp with no bleeding was found 35 cm from the incisors.       Biopsies were taken with a cold forceps for histology. Impression:           - LA Grade A reflux esophagitis. Biopsied.                       - Chronic gastritis. Biopsied.                       - Normal examined duodenum. Recommendation:       - Perform a colonoscopy today. Procedure Code(s):    --- Professional ---                       [phone], PT, Esophagogastroduodenoscopy, flexible,                        transoral; with biopsy, single or multiple Diagnosis Code(s):    --- Professional ---                       K21.0, Gastro-esophageal reflux disease with esophagitis                       K29.50, Unspecified chronic gastritis without bleeding                       D50.9, Iron deficiency anemia, unspecified CPT copyright 2016 American Medical Association. All rights reserved. The codes documented in this report are preliminary and upon coder review may  be revised to meet current compliance requirements. [name], MD 12/15/2016 2:11:19 PM This report has been signed electronically. Number of Addenda: 0 Note Initiated On: 12/15/2016 1:46 PM      [facility]

## 2016-12-15 NOTE — Anesthesia Procedure Notes (Signed)
Performed by: Mishka Stegemann Pre-anesthesia Checklist: Patient identified, Emergency Drugs available, Suction available, Patient being monitored and Timeout performed Patient Re-evaluated:Patient Re-evaluated prior to inductionOxygen Delivery Method: Nasal cannula Preoxygenation: Pre-oxygenation with 100% oxygen Intubation Type: IV induction       

## 2016-12-15 NOTE — Progress Notes (Signed)
Upon arrival to pacu  Labored resp   resp rate 55   Applied bi pap   Lung sounds nothing   Color pale

## 2016-12-15 NOTE — Anesthesia Post-op Follow-up Note (Cosign Needed)
Anesthesia QCDR form completed.        

## 2016-12-15 NOTE — Progress Notes (Signed)
abg back  Dr sudini  In to check pt      Dr Marcello Moores in to pt

## 2016-12-15 NOTE — Progress Notes (Addendum)
He should not anxious and wanted BiPAP off. She is mildly tachypneic but saturating 97% on 2 L oxygen and feels better. Seems to have improved stabilization. Will continue admission but patient can be admitted to medical floor on nasal cannula.

## 2016-12-15 NOTE — Anesthesia Postprocedure Evaluation (Signed)
Anesthesia Post Note  Patient: Sandra Ellison  Procedure(s) Performed: Procedure(s) (LRB): COLONOSCOPY WITH PROPOFOL (N/A) ESOPHAGOGASTRODUODENOSCOPY (EGD) WITH PROPOFOL (N/A)  Patient location during evaluation: Endoscopy Anesthesia Type: General Level of consciousness: awake and alert Pain management: pain level controlled Vital Signs Assessment: post-procedure vital signs reviewed and stable Respiratory status: spontaneous breathing, nonlabored ventilation, respiratory function stable and patient connected to nasal cannula oxygen Cardiovascular status: blood pressure returned to baseline and stable Postop Assessment: no signs of nausea or vomiting Anesthetic complications: no     Last Vitals:  Vitals:   12/15/16 1812 12/15/16 2008  BP: (!) 106/93 (!) 109/42  Pulse: 84 86  Resp: (!) 28 20  Temp: 36.7 C 36.6 C    Last Pain:  Vitals:   12/15/16 2014  TempSrc:   PainSc: [place]

## 2016-12-15 NOTE — Transfer of Care (Signed)
Immediate Anesthesia Transfer of Care Note  Patient: Sandra Ellison  Procedure(s) Performed: Procedure(s): COLONOSCOPY WITH PROPOFOL (N/A) ESOPHAGOGASTRODUODENOSCOPY (EGD) WITH PROPOFOL (N/A)  Patient Location: PACU   Anesthesia Type:General  Level of Consciousness: sedated  Airway & Oxygen Therapy: Patient Spontanous Breathing and Patient connected to nasal cannula oxygen  Post-op Assessment: Report given to RN and Post -op Vital signs reviewed and stable  Post vital signs: Reviewed and stable  Last Vitals:  Vitals:   12/15/16 1530 12/15/16 1540  BP:  (!) 100/57  Pulse: (!) 115 71  Resp:  (!) 25  Temp: (!) 46.9 C     Complications: No apparent anesthesia complications

## 2016-12-15 NOTE — Progress Notes (Signed)
Abg"s drawn  And hospitalist called   cxr to be done

## 2016-12-15 NOTE — Op Note (Signed)
[facility] Patient Name: Sandra Ellison Procedure Date: 12/15/2016 1:46 PM MRN: [id number] Account #: [id number] Date of Birth: [birth year] Admit Type: Outpatient Age: 71 Room: [facility] ENDO ROOM 1 Gender: Female Note Status: Finalized Procedure:            Colonoscopy Indications:          Iron deficiency anemia Providers:            [name], MD Referring MD:         Einar Pheasant, MD (Referring MD) Medicines:            Monitored Anesthesia Care Complications:        No immediate complications. Procedure:            Pre-Anesthesia Assessment:                       - Prior to the procedure, a History and Physical was                        performed, and patient medications, allergies and                        sensitivities were reviewed. The patient's tolerance of                        previous anesthesia was reviewed.                       - The risks and benefits of the procedure and the                        sedation options and risks were discussed with the                        patient. All questions were answered and informed                        consent was obtained.                       After obtaining informed consent, the colonoscope was                        passed under direct vision. Throughout the procedure,                        the patient's blood pressure, pulse, and oxygen                        saturations were monitored continuously. The                        Colonoscope was introduced through the anus and                        advanced to the the cecum, identified by appendiceal                        orifice and ileocecal valve. The colonoscopy was  technically difficult and complex due to significant                        looping and a tortuous colon. Successful completion of                        the procedure was aided by changing the patient to a                        supine  position. The patient tolerated the procedure                        well. The quality of the bowel preparation was                        excellent. Findings:      Three sessile polyps were found in the cecum. The polyps were 5 mm in       size. These were biopsied with a cold forceps for histology.      Three sessile polyps were found in the hepatic flexure. The polyps were       5 to 9 mm in size. These were biopsied with a cold forceps for histology.      Two sessile polyps were found in the splenic flexure. The polyps were 5       to 8 mm in size. These were biopsied with a cold forceps for histology.      A 12 mm polyp was found in the sigmoid colon. The polyp was       semi-pedunculated. The polyp was removed with a hot snare. Resection and       retrieval were complete.      The retroflexed view of the distal rectum and anal verge was normal and       showed no anal or rectal abnormalities. Impression:           - Three 5 mm polyps in the cecum. Biopsied.                       - Three 5 to 9 mm polyps at the hepatic flexure.                        Biopsied.                       - Two 5 to 8 mm polyps at the splenic flexure. Biopsied.                       - One 12 mm polyp in the sigmoid colon, removed with a                        hot snare. Resected and retrieved.                       - The distal rectum and anal verge are normal on                        retroflexion view. Recommendation:       - Resume Plavix on Monday, July 16th. Procedure Code(s):    --- Professional ---  45385, Colonoscopy, flexible; with removal of tumor(s),                        polyp(s), or other lesion(s) by snare technique                       45380, 59, Colonoscopy, flexible; with biopsy, single                        or multiple Diagnosis Code(s):    --- Professional ---                       D12.0, Benign neoplasm of cecum                       D12.3, Benign neoplasm of  transverse colon (hepatic                        flexure or splenic flexure)                       D12.5, Benign neoplasm of sigmoid colon                       D50.9, Iron deficiency anemia, unspecified CPT copyright 2016 [employer]. All rights reserved. The codes documented in this report are preliminary and upon coder review may  be revised to meet current compliance requirements. [name], MD 12/15/2016 3:09:22 PM This report has been signed electronically. Number of Addenda: 0 Note Initiated On: 12/15/2016 1:46 PM Scope Withdrawal Time: 0 hours 30 minutes 19 seconds  Total Procedure Duration: 0 hours 51 minutes 27 seconds       [facility]

## 2016-12-15 NOTE — Progress Notes (Signed)
Off bi pap   States she has right shoulder pain from verebra    Applied ice pack   Applied nasal cannula  At 4 liters

## 2016-12-15 NOTE — Progress Notes (Signed)
Off oxygen   sat on room air 97

## 2016-12-15 NOTE — Progress Notes (Signed)
Hypoxia at end of procedure resolved with short course of positive pressure ventilation.  Multiple colonic polyps, one small telangiectasia in colon.  Gastritis and mild esophagitis on EGD.  No source for new onset anemia.    Will arrange for SBFT.

## 2016-12-15 NOTE — H&P (Signed)
Sandra Ellison [id number] 1945/06/11     HPI: 71 y/o female with new onset anemia.  History multiple polyps in the past. For upper and lower endoscopy. Tolerated prep well.    Prescriptions Prior to Admission  Medication Sig Dispense Refill Last Dose  . albuterol (PROVENTIL) (2.5 MG/3ML) 0.083% nebulizer solution USE ONE TREATMENT EVERY 6 HOURS AS NEEDED 180 mL 1 Past Week at Unknown time  . aspirin EC 81 MG tablet Take 81 mg by mouth daily.   12/14/2016 at Unknown time  . atorvastatin (LIPITOR) 40 MG tablet TAKE 1 TABLET (40 MG TOTAL) BY MOUTH DAILY. 30 tablet 3 Past Week at Unknown time  . carvedilol (COREG) 6.25 MG tablet Take 6.25 mg by mouth 2 (two) times daily with a meal.  3 12/15/2016 at Unknown time  . cholecalciferol (VITAMIN D) 1000 units tablet Take 1,000 Units by mouth once a week.   Past Week at Unknown time  . citalopram (CELEXA) 10 MG tablet TAKE 1 TABLET (10 MG TOTAL) BY MOUTH DAILY. 30 tablet 3 12/14/2016 at Unknown time  . clopidogrel (PLAVIX) 75 MG tablet TAKE 1 TABLET (75 MG TOTAL) BY MOUTH DAILY. 30 tablet 5 Past Week at Unknown time  . Fe Fum-FePoly-Vit C-Vit B3 (INTEGRA) 62.5-62.5-40-3 MG CAPS One per day (Patient taking differently: Take 1 capsule by mouth daily. ) 30 capsule 2 Past Week at Unknown time  . furosemide (LASIX) 20 MG tablet TAKE 1 TABLET (20 MG TOTAL) BY MOUTH DAILY AS NEEDED. 30 tablet 2 Past Week at Unknown time  . gabapentin (NEURONTIN) 100 MG capsule TAKE 1 CAPSULE (100 MG TOTAL) BY MOUTH 3 (THREE) TIMES DAILY AS NEEDED. 90 capsule 3 12/14/2016 at Unknown time  . LORazepam (ATIVAN) 0.5 MG tablet Take 1 tablet (0.5 mg total) by mouth every 8 (eight) hours as needed for anxiety. 30 tablet 0 12/14/2016 at Unknown time  . ondansetron (ZOFRAN-ODT) 4 MG disintegrating tablet TAKE 1 TABLET (4 MG TOTAL) BY MOUTH 2 (TWO) TIMES DAILY AS NEEDED FOR NAUSEA OR VOMITING. 20 tablet 0 12/14/2016 at Unknown time  . pantoprazole (PROTONIX) 40 MG tablet TAKE 1 TABLET (40 MG TOTAL)  BY MOUTH DAILY. 30 tablet 10 12/15/2016 at Unknown time  . SPIRIVA HANDIHALER 18 MCG inhalation capsule INHALE ONCE DAILY 30 capsule 5 12/15/2016 at Unknown time  . VENTOLIN HFA 108 (90 Base) MCG/ACT inhaler INHALE 2 PUFFS EVERY 6 HOURS AS NEEDED FOR WHEEZING OR SHORTNESS OF BREATH 18 Inhaler 3 12/14/2016 at Unknown time  . furosemide (LASIX) 40 MG tablet Take 1 tablet (40 mg total) by mouth daily as needed. 90 tablet 3 Taking  . gabapentin (NEURONTIN) 300 MG capsule Take 1 capsule (300 mg total) by mouth 3 (three) times daily. 270 capsule 1 Taking  . lisinopril (PRINIVIL,ZESTRIL) 5 MG tablet Take 1 tablet (5 mg total) by mouth daily. 90 tablet 3 Taking   Allergies  Allergen Reactions  . Ranitidine Anaphylaxis  . Ranitidine Hcl Anaphylaxis and Swelling  . Entresto [Sacubitril-Valsartan] Other (See Comments)    Potassium issues   Past Medical History:  Diagnosis Date  . Allergy   . Arthritis   . Carotid arterial disease ([place])    a. 02/2015 Carotid U/S: RICA 24-40%, LICA 10-27%.  . Chronic combined systolic and diastolic CHF (congestive heart failure) ([place])    a. 01/2012 Echo: EF 30-35%, mod conc LVH, sev inf HK, mildly dil LA, mild-mod MR, Trace TR, RVSP 40-74mmHg.    Marland Kitchen Chronic kidney disease (CKD), stage  II (mild)   . COPD (chronic obstructive pulmonary disease) ([place])   . Dilated cardiomyopathy ([place])    a. 02/2011 MV: EF 27%, anteroseptal, inf, inferolateral scar, no ischemia.  Marland Kitchen GERD (gastroesophageal reflux disease)   . History of abnormal Pap smear    a. s/p vaginal hysterectomy  . History of diverticulosis   . Hx of colonic polyps   . Hyperkalemia    a. 02/2015 in setting of entresto/AKI->entresto d/c'd.  . Hypertension   . Iron deficiency anemia    a. h/o transfusions, prev followed by heme/onc.  . Pulmonary HTN ([place])   . Stroke ([place])   . TIA (transient ischemic attack)   . Tobacco abuse    Past Surgical History:  Procedure Laterality Date  . CARDIAC CATHETERIZATION Left  08/05/2015   Procedure: Left Heart Cath and Coronary Angiography;  Surgeon: Minna Merritts, MD;  Location: [place] [facility];  Service: Cardiovascular;  Laterality: Left;  . COLONOSCOPY  10/2010  . ORIF DISTAL RADIUS FRACTURE  2011   right  . TONSILLECTOMY  age 63  . VAGINAL HYSTERECTOMY  1981   secondary to abnormal pap smear   Social History   Social History  . Marital status: Divorced    Spouse name: N/A  . Number of children: 1  . Years of education: N/A   Occupational History  . Not on file.   Social History Main Topics  . Smoking status: Current Every Day Smoker    Packs/day: 0.50    Years: 42.00    Types: Cigarettes  . Smokeless tobacco: Never Used     Comment: Down to 3 cigarettes per day  . Alcohol use No  . Drug use: No  . Sexual activity: No   Other Topics Concern  . Not on file   Social History Narrative  . No narrative on file   Social History   Social History Narrative  . No narrative on file     ROS: Negative.     PE: HEENT: Negative. Lungs: Clear. Cardio: RR.  Assessment/Plan:  Proceed with planned endoscopies. Sandra Ellison 12/15/2016

## 2016-12-15 NOTE — Anesthesia Preprocedure Evaluation (Signed)
Anesthesia Evaluation  Patient identified by MRN, date of birth, ID band Patient awake    Reviewed: Allergy & Precautions, H&P , NPO status , Patient's Chart, lab work & pertinent test results  History of Anesthesia Complications Negative for: history of anesthetic complications  Airway Mallampati: III  TM Distance: <3 FB Neck ROM: limited    Dental  (+) Poor Dentition, Chipped, Missing   Pulmonary COPD, Current Smoker,           Cardiovascular Exercise Tolerance: Poor hypertension, (-) angina+ CAD, + Peripheral Vascular Disease and +CHF  (-) Past MI      Neuro/Psych Anxiety TIA Neuromuscular disease CVA, Residual Symptoms negative psych ROS   GI/Hepatic Neg liver ROS, GERD  Medicated and Controlled,  Endo/Other  negative endocrine ROS  Renal/GU Renal disease  negative genitourinary   Musculoskeletal  (+) Arthritis ,   Abdominal   Peds  Hematology negative hematology ROS (+)   Anesthesia Other Findings Past Medical History: No date: Allergy No date: Arthritis No date: Carotid arterial disease ([place])     Comment: a. 02/2015 Carotid U/S: RICA 52-84%, LICA               13-24%. No date: Chronic combined systolic and diastolic CHF (c*     Comment: a. 01/2012 Echo: EF 30-35%, mod conc LVH, sev               inf HK, mildly dil LA, mild-mod MR, Trace TR,               RVSP 40-73mmHg.   No date: Chronic kidney disease (CKD), stage II (mild) No date: COPD (chronic obstructive pulmonary disease) (* No date: Dilated cardiomyopathy ([place])     Comment: a. 02/2011 MV: EF 27%, anteroseptal, inf,               inferolateral scar, no ischemia. No date: GERD (gastroesophageal reflux disease) No date: History of abnormal Pap smear     Comment: a. s/p vaginal hysterectomy No date: History of diverticulosis No date: Hx of colonic polyps No date: Hyperkalemia     Comment: a. 02/2015 in setting of entresto/AKI->entresto   d/c'd. No date: Hypertension No date: Iron deficiency anemia     Comment: a. h/o transfusions, prev followed by               heme/onc. No date: Pulmonary HTN (H[facility]) No date: Stroke [facility]) No date: TIA (transient ischemic attack) No date: Tobacco abuse  Past Surgical History: 08/05/2015: CARDIAC CATHETERIZATION Left     Comment: Procedure: Left Heart Cath and Coronary               Angiography;  Surgeon: Minna Merritts, MD;                Location: [place] [facility];  Service:               Cardiovascular;  Laterality: Left; 10/2010: COLONOSCOPY 2011: ORIF DISTAL RADIUS FRACTURE     Comment: right age 71: TONSILLECTOMY 1981: VAGINAL HYSTERECTOMY     Comment: secondary to abnormal pap smear  BMI    Body Mass Index:  22.96 kg/m      Reproductive/Obstetrics negative OB ROS                             Anesthesia Physical Anesthesia Plan  ASA: IV  Anesthesia Plan: General   Post-op Pain Management:  Induction: Intravenous  PONV Risk Score and Plan:   Airway Management Planned: Natural Airway and Nasal Cannula  Additional Equipment:   Intra-op Plan:   Post-operative Plan:   Informed Consent: I have reviewed the patients History and Physical, chart, labs and discussed the procedure including the risks, benefits and alternatives for the proposed anesthesia with the patient or authorized representative who has indicated his/her understanding and acceptance.   Dental Advisory Given  Plan Discussed with: Anesthesiologist, CRNA and Surgeon  Anesthesia Plan Comments: (Patient informed that they are higher risk for complications from anesthesia during this procedure due to their medical history.  Patient voiced understanding.  Patient consented for risks of anesthesia including but not limited to:  - adverse reactions to medications - damage to teeth, lips or other oral mucosa - sore throat or hoarseness - Damage to heart, brain, lungs or  loss of life  Patient voiced understanding.)        Anesthesia Quick Evaluation

## 2016-12-15 NOTE — Progress Notes (Signed)
CXR done

## 2016-12-15 NOTE — Progress Notes (Signed)
Pt doing very well

## 2016-12-15 NOTE — H&P (Signed)
[facility] NAME: Sandra Ellison    MR#:  [id number]  DATE OF BIRTH:  [birth year]  DATE OF ADMISSION:  12/15/2016  PRIMARY CARE PHYSICIAN: Einar Pheasant, MD   REQUESTING/REFERRING PHYSICIAN: Dr. Venetia Constable  CHIEF COMPLAINT:  No chief complaint on file.  Shortness of breath  HISTORY OF PRESENT ILLNESS:  Sandra Ellison  is a 71 y.o. female with a known history of systolic chf,COPD, hypertension, anemia presented to the hospital today for outpatient procedures of EGD and colonoscopy for further workup of anemia. Patient was found to have gastritis and some colon polyps. In the PACU area patient was found to be tachypneic, febrile at 100.6. Initially on 4 L nasal cannula. Later transitioned to BiPAP. On arrival patient was found to be tachypneic on BiPAP. ABG was done which is normal. A stat chest x-ray does not show any infiltrates or pulmonary edema. Dual neb nebulizer has been given. Patient on BiPAP and her breathing is between 30-40. Saturations are 100 post non-4 L oxygen. She complains of some right shoulder pain which is chronic pain she has is worse at this time. No chest pain.  PAST MEDICAL HISTORY:   Past Medical History:  Diagnosis Date  . Allergy   . Arthritis   . Carotid arterial disease ([place])    a. 02/2015 Carotid U/S: RICA 40-98%, LICA 11-91%.  . Chronic combined systolic and diastolic CHF (congestive heart failure) ([place])    a. 01/2012 Echo: EF 30-35%, mod conc LVH, sev inf HK, mildly dil LA, mild-mod MR, Trace TR, RVSP 40-42mmHg.    Marland Kitchen Chronic kidney disease (CKD), stage II (mild)   . COPD (chronic obstructive pulmonary disease) ([place])   . Dilated cardiomyopathy ([place])    a. 02/2011 MV: EF 27%, anteroseptal, inf, inferolateral scar, no ischemia.  Marland Kitchen GERD (gastroesophageal reflux disease)   . History of abnormal Pap smear    a. s/p vaginal hysterectomy  . History of diverticulosis   . Hx of colonic polyps   . Hyperkalemia    a. 02/2015 in setting of entresto/AKI->entresto d/c'd.  . Hypertension   . Iron deficiency anemia    a. h/o transfusions, prev followed by heme/onc.  . Pulmonary HTN ([place])   . Stroke ([place])   . TIA (transient ischemic attack)   . Tobacco abuse     PAST SURGICAL HISTORY:   Past Surgical History:  Procedure Laterality Date  . CARDIAC CATHETERIZATION Left 08/05/2015   Procedure: Left Heart Cath and Coronary Angiography;  Surgeon: Minna Merritts, MD;  Location: [facility];  Service: Cardiovascular;  Laterality: Left;  . COLONOSCOPY  10/2010  . ORIF DISTAL RADIUS FRACTURE  2011   right  . TONSILLECTOMY  age 61  . VAGINAL HYSTERECTOMY  1981   secondary to abnormal pap smear    SOCIAL HISTORY:   Social History  Substance Use Topics  . Smoking status: Current Every Day Smoker    Packs/day: 0.50    Years: 42.00    Types: Cigarettes  . Smokeless tobacco: Never Used     Comment: Down to 3 cigarettes per day  . Alcohol use No    FAMILY HISTORY:   Family History  Problem Relation Age of Onset  . Arthritis Mother   . Alcohol abuse Father   . Arthritis Father   . Cancer Father        throat/spine  . Breast cancer Neg Hx     DRUG ALLERGIES:  Allergies  Allergen Reactions  . Ranitidine Anaphylaxis  . Ranitidine Hcl Anaphylaxis and Swelling  . Entresto [Sacubitril-Valsartan] Other (See Comments)    Potassium issues    REVIEW OF SYSTEMS:   Review of Systems  Constitutional: Negative for chills and fever.  HENT: Negative for sore throat.   Eyes: Negative for blurred vision, double vision and pain.  Respiratory: Positive for shortness of breath. Negative for cough, hemoptysis and wheezing.   Cardiovascular: Negative for chest pain, palpitations, orthopnea and leg swelling.  Gastrointestinal: Negative for abdominal pain, constipation, diarrhea, heartburn, nausea and vomiting.  Genitourinary: Negative for dysuria and hematuria.  Musculoskeletal: Positive for  joint pain and neck pain. Negative for back pain.  Skin: Negative for rash.  Neurological: Negative for sensory change, speech change, focal weakness and headaches.  Endo/Heme/Allergies: Does not bruise/bleed easily.  Psychiatric/Behavioral: Negative for depression. The patient is not nervous/anxious.     MEDICATIONS AT HOME:   Prior to Admission medications   Medication Sig Start Date End Date Taking? Authorizing Provider  albuterol (PROVENTIL) (2.5 MG/3ML) 0.083% nebulizer solution USE ONE TREATMENT EVERY 6 HOURS AS NEEDED 05/05/16  Yes Einar Pheasant, MD  aspirin EC 81 MG tablet Take 81 mg by mouth daily.   Yes [provider]  atorvastatin (LIPITOR) 40 MG tablet TAKE 1 TABLET (40 MG TOTAL) BY MOUTH DAILY. 10/19/16  Yes Rogelia Mire, NP  carvedilol (COREG) 6.25 MG tablet Take 6.25 mg by mouth 2 (two) times daily with a meal. 11/10/15  Yes [provider]  cholecalciferol (VITAMIN D) 1000 units tablet Take 1,000 Units by mouth once a week.   Yes [provider]  citalopram (CELEXA) 10 MG tablet TAKE 1 TABLET (10 MG TOTAL) BY MOUTH DAILY. 10/19/16  Yes Einar Pheasant, MD  clopidogrel (PLAVIX) 75 MG tablet TAKE 1 TABLET (75 MG TOTAL) BY MOUTH DAILY. 07/22/16  Yes Einar Pheasant, MD  Fe Fum-FePoly-Vit C-Vit B3 (INTEGRA) 62.5-62.5-40-3 MG CAPS One per day Patient taking differently: Take 1 capsule by mouth daily.  06/12/15  Yes Einar Pheasant, MD  furosemide (LASIX) 20 MG tablet TAKE 1 TABLET (20 MG TOTAL) BY MOUTH DAILY AS NEEDED. 10/19/16  Yes Gollan, Kathlene November, MD  gabapentin (NEURONTIN) 100 MG capsule TAKE 1 CAPSULE (100 MG TOTAL) BY MOUTH 3 (THREE) TIMES DAILY AS NEEDED. 11/03/16  Yes Crecencio Mc, MD  LORazepam (ATIVAN) 0.5 MG tablet Take 1 tablet (0.5 mg total) by mouth every 8 (eight) hours as needed for anxiety. 05/07/16 05/07/17 Yes Earleen Newport, MD  ondansetron (ZOFRAN-ODT) 4 MG disintegrating tablet TAKE 1 TABLET (4 MG TOTAL) BY MOUTH 2 (TWO)  TIMES DAILY AS NEEDED FOR NAUSEA OR VOMITING. 12/01/16  Yes Einar Pheasant, MD  pantoprazole (PROTONIX) 40 MG tablet TAKE 1 TABLET (40 MG TOTAL) BY MOUTH DAILY. 07/22/16  Yes Einar Pheasant, MD  SPIRIVA HANDIHALER 18 MCG inhalation capsule INHALE ONCE DAILY 07/22/16  Yes Einar Pheasant, MD  VENTOLIN HFA 108 (90 Base) MCG/ACT inhaler INHALE 2 PUFFS EVERY 6 HOURS AS NEEDED FOR WHEEZING OR SHORTNESS OF BREATH 03/09/16  Yes Einar Pheasant, MD  furosemide (LASIX) 40 MG tablet Take 1 tablet (40 mg total) by mouth daily as needed. 09/07/16   Minna Merritts, MD  gabapentin (NEURONTIN) 300 MG capsule Take 1 capsule (300 mg total) by mouth 3 (three) times daily. 09/09/16   Einar Pheasant, MD  lisinopril (PRINIVIL,ZESTRIL) 5 MG tablet Take 1 tablet (5 mg total) by mouth daily. 08/07/16 11/05/16  Minna Merritts,  MD     VITAL SIGNS:  Blood pressure (!) 108/55, pulse 85, temperature (!) 100.6 F (38.1 C), resp. rate (!) 24, height 5\' 5"  (1.651 m), weight 62.6 kg (138 lb), SpO2 99 %.  PHYSICAL EXAMINATION:  Physical Exam  GENERAL:  71 y.o.-year-old patient lying in the bed with  Conversational dyspnea. EYES: Pupils equal, round, reactive to light and accommodation. No scleral icterus. Extraocular muscles intact.  HEENT: Head atraumatic, normocephalic. Oropharynx and nasopharynx clear. No oropharyngeal erythema, moist oral mucosa  NECK:  Supple, no jugular venous distention. No thyroid enlargement, no tenderness.  LUNGS: Increased work of breathing. Clear to auscultation on both sides CARDIOVASCULAR: S1, S2 normal. No murmurs, rubs, or gallops.  ABDOMEN: Soft, nontender, nondistended. Bowel sounds present. No organomegaly or mass.  EXTREMITIES: No  cyanosis, or clubbing. + 2 pedal & radial pulses b/l.  1+ lower extremity edema NEUROLOGIC: Cranial nerves II through XII are intact. No focal Motor or sensory deficits appreciated b/l PSYCHIATRIC: The patient is alert and oriented x 3. Good affect.  SKIN: No  obvious rash, lesion, or ulcer.   LABORATORY PANEL:   CBC No results for input(s): WBC, HGB, HCT, PLT in the last 168 hours. ------------------------------------------------------------------------------------------------------------------  Chemistries  No results for input(s): NA, K, CL, CO2, GLUCOSE, BUN, CREATININE, CALCIUM, MG, AST, ALT, ALKPHOS, BILITOT in the last 168 hours.  Invalid input(s): GFRCGP ------------------------------------------------------------------------------------------------------------------  Cardiac Enzymes No results for input(s): TROPONINI in the last 168 hours. ------------------------------------------------------------------------------------------------------------------  RADIOLOGY:  Dg Chest Port 1 View  Result Date: 12/15/2016 CLINICAL DATA:  Difficulty breathing post endoscopy and colonoscopy. EXAM: PORTABLE CHEST 1 VIEW COMPARISON:  Chest radiograph 11/24/2015 FINDINGS: Cardiac silhouette is enlarged similar prior. Fine interstitial pattern similar to prior. No pneumothorax. No pulmonary edema. Basilar atelectasis. IMPRESSION: No significant change.  Cardiomegaly and fine interstitial pattern. Electronically Signed   By: Suzy Bouchard M.D.   On: 12/15/2016 16:08   IMPRESSION AND PLAN:   * Acute hypoxic respiratory failure Could be aspiration during the endoscopy procedure. Temperature of 100.6. No infiltrates on chest x-ray. Will check CBC and BMP. Nebulizer. One dose of Solu-Medrol IV. Check blood cultures. Discussed with Dr. Jamal Collin of pulmonary. She continues to be significantly tachypneic breathing over 30/min off the BiPAP. Start back on BiPAP and admitted to stepdown unit. ABG reviewed which was done after 20 minutes of BiPAP Chest x-ray shows no pulmonary edema. Check troponin and EKG.  * Chronic systolic CHF ejection fraction of 25-30%. Continue home medications. No signs of fluid overload.  * Chronic right shoulder pain. Pain  medications as needed. Call to have some irritation after endoscopy on the right shoulder.  * Gastritis on EGD. No bleeding.  * DVT prophylaxis with Lovenox  All the records are reviewed and case discussed with ED provider. Management plans discussed with the patient, family and they are in agreement.  CODE STATUS: FULL  TOTAL CC TIME TAKING CARE OF THIS PATIENT: 40 minutes.   Hillary Bow R M.D on 12/15/2016 at 4:36 PM  Between 7am to 6pm - Pager - [phone]  After 6pm go to [email or web address] - password [email or web address] [facility]  Office  [phone]  CC: Primary care physician; Einar Pheasant, MD  Note: This dictation was prepared with Dragon dictation along with smaller phrase technology. Any transcriptional errors that result from this process are unintentional.

## 2016-12-15 NOTE — Progress Notes (Signed)
Anticoagulation monitoring(Lovenox):  71 yo female ordered Lovenox 40 mg Q24h  Filed Weights   12/15/16 1309  Weight: 138 lb (62.6 kg)   BMI    Lab Results  Component Value Date   CREATININE 1.83 (H) 12/15/2016   CREATININE 1.5 (A) 11/09/2016   CREATININE 1.68 (H) 10/13/2016   Estimated Creatinine Clearance: 25.7 mL/min (A) (by C-G formula based on SCr of 1.83 mg/dL (H)). Hemoglobin & Hematocrit     Component Value Date/Time   HGB 14.1 12/15/2016 1714   HGB 18.0 (H) 05/23/2014 1231   HCT 41.5 12/15/2016 1714   HCT 54.8 (H) 05/23/2014 1231     Per Protocol for Patient with estCrcl < 30 ml/min and BMI < 40, will transition to Lovenox 30 mg Q24h.

## 2016-12-16 ENCOUNTER — Encounter: Payer: Self-pay | Admitting: General Surgery

## 2016-12-16 ENCOUNTER — Telehealth: Payer: Self-pay | Admitting: Internal Medicine

## 2016-12-16 DIAGNOSIS — J9601 Acute respiratory failure with hypoxia: Secondary | ICD-10-CM | POA: Diagnosis not present

## 2016-12-16 DIAGNOSIS — D120 Benign neoplasm of cecum: Secondary | ICD-10-CM | POA: Diagnosis not present

## 2016-12-16 DIAGNOSIS — J449 Chronic obstructive pulmonary disease, unspecified: Secondary | ICD-10-CM | POA: Diagnosis not present

## 2016-12-16 DIAGNOSIS — R339 Retention of urine, unspecified: Secondary | ICD-10-CM | POA: Diagnosis not present

## 2016-12-16 DIAGNOSIS — K228 Other specified diseases of esophagus: Secondary | ICD-10-CM | POA: Diagnosis not present

## 2016-12-16 DIAGNOSIS — D124 Benign neoplasm of descending colon: Secondary | ICD-10-CM | POA: Diagnosis not present

## 2016-12-16 DIAGNOSIS — D509 Iron deficiency anemia, unspecified: Secondary | ICD-10-CM | POA: Diagnosis not present

## 2016-12-16 DIAGNOSIS — R0902 Hypoxemia: Secondary | ICD-10-CM | POA: Diagnosis present

## 2016-12-16 DIAGNOSIS — K210 Gastro-esophageal reflux disease with esophagitis: Secondary | ICD-10-CM | POA: Diagnosis not present

## 2016-12-16 DIAGNOSIS — K2950 Unspecified chronic gastritis without bleeding: Secondary | ICD-10-CM | POA: Diagnosis not present

## 2016-12-16 DIAGNOSIS — D125 Benign neoplasm of sigmoid colon: Secondary | ICD-10-CM | POA: Diagnosis not present

## 2016-12-16 DIAGNOSIS — I5023 Acute on chronic systolic (congestive) heart failure: Secondary | ICD-10-CM | POA: Diagnosis not present

## 2016-12-16 DIAGNOSIS — D123 Benign neoplasm of transverse colon: Secondary | ICD-10-CM | POA: Diagnosis not present

## 2016-12-16 LAB — CBC
HCT: 43.5 % (ref 35.0–47.0)
HEMOGLOBIN: 14.4 g/dL (ref 12.0–16.0)
MCH: 32.5 pg (ref 26.0–34.0)
MCHC: 33.2 g/dL (ref 32.0–36.0)
MCV: 97.8 fL (ref 80.0–100.0)
Platelets: 175 10*3/uL (ref 150–440)
RBC: 4.45 MIL/uL (ref 3.80–5.20)
RDW: 14 % (ref 11.5–14.5)
WBC: 5.5 10*3/uL (ref 3.6–11.0)

## 2016-12-16 LAB — BASIC METABOLIC PANEL
ANION GAP: 10 (ref 5–15)
BUN: 38 mg/dL — ABNORMAL HIGH (ref 6–20)
CALCIUM: 9.2 mg/dL (ref 8.9–10.3)
CHLORIDE: 102 mmol/L (ref 101–111)
CO2: 24 mmol/L (ref 22–32)
Creatinine, Ser: 2.01 mg/dL — ABNORMAL HIGH (ref 0.44–1.00)
GFR calc non Af Amer: 24 mL/min — ABNORMAL LOW (ref >60)
GFR, EST AFRICAN AMERICAN: 28 mL/min — AB (ref >60)
Glucose, Bld: 143 mg/dL — ABNORMAL HIGH (ref 65–99)
Potassium: 4.3 mmol/L (ref 3.5–5.1)
Sodium: 136 mmol/L (ref 135–145)

## 2016-12-16 LAB — TROPONIN I: Troponin I: <0.03 ng/mL (ref <0.03)

## 2016-12-16 MED ORDER — PANTOPRAZOLE SODIUM 40 MG PO TBEC: 40.0000 mg | DELAYED_RELEASE_TABLET | Freq: Two times a day (BID) | ORAL | 1 refills | Status: DC

## 2016-12-16 MED ORDER — TROLAMINE SALICYLATE 10 % EX CREA
TOPICAL_CREAM | Freq: Two times a day (BID) | CUTANEOUS | Status: DC | PRN
  Administered 2016-12-16: 20:00:00 via TOPICAL
  Filled 2016-12-16: qty 85

## 2016-12-16 MED ORDER — TAMSULOSIN HCL 0.4 MG PO CAPS
0.4000 mg | ORAL_CAPSULE | Freq: Every day | ORAL | Status: DC
  Administered 2016-12-16: 0.4 mg via ORAL
  Filled 2016-12-16: qty 1

## 2016-12-16 MED ORDER — ONDANSETRON HCL 4 MG PO TABS: 4.0000 mg | ORAL_TABLET | Freq: Four times a day (QID) | ORAL | 0 refills | Status: DC | PRN

## 2016-12-16 NOTE — Discharge Summary (Signed)
[facility] [place] NAME: Sandra Ellison    MR#:  [id number]  DATE OF BIRTH:  1945-10-29  DATE OF ADMISSION:  12/15/2016 ADMITTING PHYSICIAN: [name], MD  DATE OF DISCHARGE: 12/16/2016  PRIMARY CARE PHYSICIAN: Einar Pheasant, MD    ADMISSION DIAGNOSIS:  HX POLYPS  DISCHARGE DIAGNOSIS:  Active Problems:   Acute respiratory failure ([place])   Hypoxia    Gastritis  SECONDARY DIAGNOSIS:   Past Medical History:  Diagnosis Date  . Allergy   . Arthritis   . Carotid arterial disease ([place])    a. 02/2015 Carotid U/S: RICA 16-07%, LICA 37-10%.  . Chronic combined systolic and diastolic CHF (congestive heart failure) ([place])    a. 01/2012 Echo: EF 30-35%, mod conc LVH, sev inf HK, mildly dil LA, mild-mod MR, Trace TR, RVSP 40-86mHg.    .Marland KitchenChronic kidney disease (CKD), stage II (mild)   . COPD (chronic obstructive pulmonary disease) ([place]   . Dilated cardiomyopathy ([place]    a. 02/2011 MV: EF 27%, anteroseptal, inf, inferolateral scar, no ischemia.  .Marland KitchenGERD (gastroesophageal reflux disease)   . History of abnormal Pap smear    a. s/p vaginal hysterectomy  . History of diverticulosis   . Hx of colonic polyps   . Hyperkalemia    a. 02/2015 in setting of entresto/AKI->entresto d/c'd.  . Hypertension   . Iron deficiency anemia    a. h/o transfusions, prev followed by heme/onc.  . Pulmonary HTN ([place]   . Stroke ([place]   . TIA (transient ischemic attack)   . Tobacco abuse     HOSPITAL COURSE:   * Acute hypoxic respiratory failure   Also required bipap in post op period.   Likely was due to sedation medication use.   Pt is completely fine, on room air and no complains now.  * Chronic systolic CHF ejection fraction of 25-30%. Continue home medications. No signs of fluid overload.  * Chronic right shoulder pain. Pain medications as needed. Call to have some irritation after endoscopy on the right shoulder.   Acetaminophen and  ice packs.  * Gastritis on EGD. No bleeding.   Give PPI Bid.  * DVT prophylaxis with Lovenox   DISCHARGE CONDITIONS:   Stable.  CONSULTS OBTAINED:    DRUG ALLERGIES:   Allergies  Allergen Reactions  . Ranitidine Anaphylaxis  . Ranitidine Hcl Anaphylaxis and Swelling  . Entresto [Sacubitril-Valsartan] Other (See Comments)    Potassium issues    DISCHARGE MEDICATIONS:   Current Discharge Medication List    START taking these medications   Details  ondansetron (ZOFRAN) 4 MG tablet Take 1 tablet (4 mg total) by mouth every 6 (six) hours as needed for nausea. Qty: 20 tablet, Refills: 0      CONTINUE these medications which have CHANGED   Details  pantoprazole (PROTONIX) 40 MG tablet Take 1 tablet (40 mg total) by mouth 2 (two) times daily. Qty: 60 tablet, Refills: 1      CONTINUE these medications which have NOT CHANGED   Details  albuterol (PROVENTIL) (2.5 MG/3ML) 0.083% nebulizer solution USE ONE TREATMENT EVERY 6 HOURS AS NEEDED Qty: 180 mL, Refills: 1    aspirin EC 81 MG tablet Take 81 mg by mouth daily.    atorvastatin (LIPITOR) 40 MG tablet TAKE 1 TABLET (40 MG TOTAL) BY MOUTH DAILY. Qty: 30 tablet, Refills: 3    carvedilol (COREG) 6.25 MG tablet Take 6.25 mg by mouth 2 (two)  times daily with a meal. Refills: 3    citalopram (CELEXA) 10 MG tablet TAKE 1 TABLET (10 MG TOTAL) BY MOUTH DAILY. Qty: 30 tablet, Refills: 3    clopidogrel (PLAVIX) 75 MG tablet TAKE 1 TABLET (75 MG TOTAL) BY MOUTH DAILY. Qty: 30 tablet, Refills: 5    Cyanocobalamin 1000 MCG/ML KIT Inject 1 mL as directed every 30 (thirty) days.    Fe Fum-FePoly-Vit C-Vit B3 (INTEGRA) 62.5-62.5-40-3 MG CAPS One per day Qty: 30 capsule, Refills: 2    furosemide (LASIX) 20 MG tablet TAKE 1 TABLET (20 MG TOTAL) BY MOUTH DAILY AS NEEDED. Qty: 30 tablet, Refills: 2    gabapentin (NEURONTIN) 300 MG capsule Take 1 capsule (300 mg total) by mouth 3 (three) times daily. Qty: 270 capsule, Refills:  1    LORazepam (ATIVAN) 0.5 MG tablet Take 1 tablet (0.5 mg total) by mouth every 8 (eight) hours as needed for anxiety. Qty: 30 tablet, Refills: 0    SPIRIVA HANDIHALER 18 MCG inhalation capsule INHALE ONCE DAILY Qty: 30 capsule, Refills: 5    VENTOLIN HFA 108 (90 Base) MCG/ACT inhaler INHALE 2 PUFFS EVERY 6 HOURS AS NEEDED FOR WHEEZING OR SHORTNESS OF BREATH Qty: 18 Inhaler, Refills: 3    Vitamin D, Ergocalciferol, (DRISDOL) 50000 units CAPS capsule Take 50,000 Units by mouth every 7 (seven) days.      STOP taking these medications     ondansetron (ZOFRAN-ODT) 4 MG disintegrating tablet      lisinopril (PRINIVIL,ZESTRIL) 5 MG tablet          DISCHARGE INSTRUCTIONS:    Follow with PMD in 1-2 weeks.  If you experience worsening of your admission symptoms, develop shortness of breath, life threatening emergency, suicidal or homicidal thoughts you must seek medical attention immediately by calling 911 or calling your MD immediately  if symptoms less severe.  You Must read complete instructions/literature along with all the possible adverse reactions/side effects for all the Medicines you take and that have been prescribed to you. Take any new Medicines after you have completely understood and accept all the possible adverse reactions/side effects.   Please note  You were cared for by a hospitalist during your hospital stay. If you have any questions about your discharge medications or the care you received while you were in the hospital after you are discharged, you can call the unit and asked to speak with the hospitalist on call if the hospitalist that took care of you is not available. Once you are discharged, your primary care physician will handle any further medical issues. Please note that NO REFILLS for any discharge medications will be authorized once you are discharged, as it is imperative that you return to your primary care physician (or establish a relationship with a  primary care physician if you do not have one) for your aftercare needs so that they can reassess your need for medications and monitor your lab values.    Today   CHIEF COMPLAINT:  No chief complaint on file.   HISTORY OF PRESENT ILLNESS:  Sandra Ellison  is a 71 y.o. female with a known history of systolic chf,COPD, hypertension, anemia presented to the hospital today for outpatient procedures of EGD and colonoscopy for further workup of anemia. Patient was found to have gastritis and some colon polyps. In the PACU area patient was found to be tachypneic, febrile at 100.6. Initially on 4 L nasal cannula. Later transitioned to BiPAP. On arrival patient was found to be tachypneic  on BiPAP. ABG was done which is normal. A stat chest x-ray does not show any infiltrates or pulmonary edema. Dual neb nebulizer has been given. Patient on BiPAP and her breathing is between 30-40. Saturations are 100 post non-4 L oxygen. She complains of some right shoulder pain which is chronic pain she has is worse at this time. No chest pain.  VITAL SIGNS:  Blood pressure 112/82, pulse 81, temperature (!) 97.4 F (36.3 C), temperature source Oral, resp. rate 19, height 5' 5"  (1.651 m), weight 61.7 kg (136 lb), SpO2 95 %.  I/O:   Intake/Output Summary (Last 24 hours) at 12/16/16 1308 Last data filed at 12/16/16 1202  Gross per 24 hour  Intake             1220 ml  Output                0 ml  Net             1220 ml    PHYSICAL EXAMINATION:   GENERAL:  71 y.o.-year-old patient lying in the bed with  Conversational dyspnea. EYES: Pupils equal, round, reactive to light and accommodation. No scleral icterus. Extraocular muscles intact.  HEENT: Head atraumatic, normocephalic. Oropharynx and nasopharynx clear. No oropharyngeal erythema, moist oral mucosa  NECK:  Supple, no jugular venous distention. No thyroid enlargement, no tenderness.  LUNGS: Increased work of breathing. Clear to auscultation on both  sides CARDIOVASCULAR: S1, S2 normal. No murmurs, rubs, or gallops.  ABDOMEN: Soft, nontender, nondistended. Bowel sounds present. No organomegaly or mass.  EXTREMITIES: No  cyanosis, or clubbing. + 2 pedal & radial pulses b/l.  1+ lower extremity edema NEUROLOGIC: Cranial nerves II through XII are intact. No focal Motor or sensory deficits appreciated b/l PSYCHIATRIC: The patient is alert and oriented x 3. Good affect.  SKIN: No obvious rash, lesion, or ulcer.   DATA REVIEW:   CBC  Recent Labs Lab 12/16/16 0023  WBC 5.5  HGB 14.4  HCT 43.5  PLT 175    Chemistries   Recent Labs Lab 12/15/16 1714 12/16/16 0023  NA 136 136  K 3.9 4.3  CL 102 102  CO2 23 24  GLUCOSE 90 143*  BUN 36* 38*  CREATININE 1.83* 2.01*  CALCIUM 9.0 9.2  AST 26  --   ALT 25  --   ALKPHOS 101  --   BILITOT 1.9*  --     Cardiac Enzymes  Recent Labs Lab 12/16/16 0823  TROPONINI <0.03    Microbiology Results  Results for orders placed or performed during the hospital encounter of 12/15/16  CULTURE, BLOOD (ROUTINE X 2) w Reflex to ID Panel     Status: None (Preliminary result)   Collection Time: 12/15/16  5:14 PM  Result Value Ref Range Status   Specimen Description BLOOD LEFT AC  Final   Special Requests Blood Culture adequate volume  Final   Culture NO GROWTH < 24 HOURS  Final   Report Status PENDING  Incomplete  CULTURE, BLOOD (ROUTINE X 2) w Reflex to ID Panel     Status: None (Preliminary result)   Collection Time: 12/15/16  6:30 PM  Result Value Ref Range Status   Specimen Description BLOOD BLOOD LEFT HAND  Final   Special Requests   Final    BOTTLES DRAWN AEROBIC AND ANAEROBIC Blood Culture adequate volume   Culture NO GROWTH < 12 HOURS  Final   Report Status PENDING  Incomplete    RADIOLOGY:  Dg Chest Port 1 View  Result Date: 12/15/2016 CLINICAL DATA:  Difficulty breathing post endoscopy and colonoscopy. EXAM: PORTABLE CHEST 1 VIEW COMPARISON:  Chest radiograph 11/24/2015  FINDINGS: Cardiac silhouette is enlarged similar prior. Fine interstitial pattern similar to prior. No pneumothorax. No pulmonary edema. Basilar atelectasis. IMPRESSION: No significant change.  Cardiomegaly and fine interstitial pattern. Electronically Signed   By: Suzy Bouchard M.D.   On: 12/15/2016 16:08    EKG:   Orders placed or performed during the hospital encounter of 12/15/16  . EKG 12-Lead  . EKG 12-Lead    Management plans discussed with the patient, family and they are in agreement.  CODE STATUS:     Code Status Orders        Start     Ordered   12/15/16 1628  Full code  Continuous     12/15/16 1628    Code Status History    Date Active Date Inactive Code Status Order ID Comments User Context   11/24/2015  8:03 AM 11/27/2015  9:05 PM Full Code [id number]  Lytle Butte, MD ED   03/06/2015  9:35 PM 03/07/2015  8:30 PM Full Code [id number]  Lance Coon, MD Inpatient      TOTAL TIME TAKING CARE OF THIS PATIENT: 35 minutes.    Vaughan Basta M.D on 12/16/2016 at 1:08 PM  Between 7am to 6pm - Pager - [phone]  After 6pm go to [email or web address] - password [email or web address] [facility]  Office  [phone]  CC: Primary care physician; Einar Pheasant, MD   Note: This dictation was prepared with Dragon dictation along with smaller phrase technology. Any transcriptional errors that result from this process are unintentional.

## 2016-12-16 NOTE — Progress Notes (Signed)
Awake, alert comfortable.  Reports sore throat with eating yesterday. No abdominal complaints. Sats fine on 2 L.  Abd: Soft. Lungs: Clear.  DC plans as per medicine.

## 2016-12-16 NOTE — Care Management (Signed)
CM screen. Patient admitted s/p EGD and colonoscopy due to post procedure hypoxia.  Work up for etiology thus far is negative for acute findings.   On 2 liters currently which is acute. Prior to this admission Independent in all adls, denies issues accessing medical care, obtaining medications or with transportation.  Current with her PCP.  Requested oxygen wean and or home 02 assessment.  Patient does have underlying congestive heart failure as a chronic diagnosis.

## 2016-12-16 NOTE — Progress Notes (Signed)
Patient refused I/O cath..."since I went ahead and peed". Discharged home via w/c to car. Barbaraann Faster, RN 8:08 PM 12/16/2016

## 2016-12-16 NOTE — Progress Notes (Signed)
Patient voided 266ml amber urine prior to being I/O cathed; Dr. Bridgett Larsson notified of voiding; stated that it was ok to discharge patient at this time. Barbaraann Faster, RN 7:35 PM 12/16/2016

## 2016-12-16 NOTE — Care Management CC44 (Signed)
yes       Condition Code 44 Documentation Completed  Patient Details  Name: Sandra Ellison MRN: [id number] Date of Birth: 18-Jan-1946   Condition Code 44 given:   yes Patient signature on Condition Code 44 notice:   yes Documentation of 2 MD's agreement:   yes Code 44 added to claim:    yes   Katrina Stack, RN 12/16/2016, 1:41 PM

## 2016-12-16 NOTE — Telephone Encounter (Signed)
[facility] called to make an HFU for pt for 1-2 weeks for Hypoxia after procedure. Please advise, thank you!

## 2016-12-16 NOTE — Care Management Obs Status (Signed)
[facility] NOTIFICATION   Patient Details  Name: Sandra Ellison MRN: [id number] Date of Birth: 1945-10-06   Medicare Observation Status Notification Given:  Yes    Katrina Stack, RN 12/16/2016, 1:41 PM

## 2016-12-16 NOTE — Progress Notes (Signed)
Pt had urinary retention and post void urine of > 400 ml, so will not discharge tonight.

## 2016-12-16 NOTE — Progress Notes (Addendum)
Patient was able to void approximately 42ml.  Post void bladder scan was 423 ml.  Dr Anselm Jungling cancelled discharge.in and out cath and flomax ordered. aspercream (trolamine cream) also ordered per patient's request. kpad ordered per patient request.  Discharge cancelled

## 2016-12-16 NOTE — Progress Notes (Signed)
Discharge packet given to patient; voiced understanding; Rx given (protonix); Centralized telemetry notified of discharge. Barbaraann Faster, RN 8:03 PM 12/16/2016

## 2016-12-17 NOTE — Telephone Encounter (Signed)
If she is doing fine and does not feel needs to be seen - ok.  Will need to keep [place] posted.  If wants or needs evaluation - just let me know.

## 2016-12-17 NOTE — Telephone Encounter (Signed)
Patient was admitted as observation only for Hypoxia during a procedure please advise to [place] , but patient does not qualify for TCM.

## 2016-12-18 LAB — SURGICAL PATHOLOGY

## 2016-12-18 NOTE — Progress Notes (Signed)
Cardiology Office Note  Date:  12/18/2016   ID:  Sandra Ellison, DOB 09-06-1945, MRN [id number]  PCP:  Einar Pheasant, MD   No chief complaint on file.   HPI:   70 year old woman with a history of  Pulmonary hypertension,  systolic and diastolic CHF with ejection fraction 25%,  COPD  Active smoker, catheterization February 2017 with moderate left main disease,  decreased pulse in her left arm,  peptic ulcer disease, anemia, chronic renal insufficiency who presents for follow-up of her cardiomyopathy.  History of hyperkalemia on  ARBs/Aldactone We Recommended she restart lisinopril 5 mg daily  Discharge from the hospital 12/16/2016 with shortness of breath, respiratory failure Hospital records reviewed with the patient in detail She had EGD and colonoscopy for further workup of anemia  PACU area patient was found to be tachypneic, febrile at 100.6. Initially on 4 L nasal cannula. Later transitioned to BiPAP. On arrival patient was found to be tachypneic on BiPAP. ABG was done which is normal. A stat chest x-ray does not show any infiltrates or pulmonary edema. Dual neb nebulizer has been given. Patient on BiPAP and her breathing is between 30-40. Saturations are 100 post non-4 L oxygen. She complains of some right shoulder pain which is chronic pain she has is worse at this time. No chest pain   On her last clinic visit she had significant weight gain, leg swelling, abdominal bloating We've recommended she increase Lasix up to 40 mg twice a day until fluid improved Then recommended she take Lasix 40 mg daily Weight was up 14 pounds, 121 up to 135 pounds We restarted lisinopril 5 mg daily We did check her lab work 2 weeks ago that showed acute renal failure secondary to prerenal state. We've recommended she hold her Lasix with recheck BMP 1 week ago, improvement of creatinine down to 1.63 above her baseline 1.3  This AM took 20 mg lasix, For slight swelling in her legs She has  not taken much Lasix in the past week or 2 since episode of acute renal failure from dehydration Overall feels well no complaints She does not have a scale at home  Denies any significant shortness of breath, some gait instability, knee pain  Depressed ejection fraction out of proportion to coronary artery disease Unable to tolerate ARB's, Aldactone secondary to hyperkalemia Previously on lisinopril, minimal increase in potassium  Weight was up 14 pounds from prior clinic visit, previously 121 pounds, last clinic visit up to 135 pounds Recommended she increase Lasix up to 40 mg twice a day. Once edema has improved with decreased down to 40 mg daily might even 20 mg daily   Other past medical history reviewed stroke in June 2017 Left side deficits,gait instability Acute stroke seen on MRI left cerebellum Carotid ultrasound showing mild to moderate carotid disease bilaterally Echocardiogram during the hospital course showing ejection fraction less than 25% discharged on Plavix  peptic ulcer disease, cardiomyopathy with history of systolic and diastolic CHF, COPD with long smoking history who continues to smoke, pulmonary hypertension seen on echocardiogram in March 2012 who presented to [facility] with increasing shortness of breath on February 11, 2011, chronic renal insufficiency, stage II, severe iron deficiency anemia, history of systolic heart failure, cardiac catheterization February 2017 for worsening ejection fraction found to have moderate left main disease, who presents for follow-up of her systolic CHF. Decreased pulse in the left arm  She has neuropathy in her legs. Other workup including CT scan of the chest 06/19/2015  showing coronary calcifications, aortic atherosclerosis. Images reviewed with her in detail  Ultrasound of the abdomen showing kidney stones, sludge in the gallbladder, atrophic left kidney. Results discussed with her She continues to smoke  Previously taken  off entresto and ARB's for high potassium bidil caused headaches Reports she is tolerating hydralazine Was previously on lisinopril 10 mg daily several years ago and reports having no side effect  cardiac catheterization details : LM lesion, 65% stenosed, Ost LAD lesion, 50% stenosed, Dist Cx to LPDA lesion, 80% stenosed, 2nd Mrg lesion, 40% stenosed.  Seen in the emergency room twice, once in 05/29/2015 for shortness of breath with cough felt to be from COPD exacerbation, given a Z-Pak and steroids Second trip to the ER 07/18/2015 for nausea vomiting felt to be a GI bug  Recent CT scan of the chest reviewed with her in detail showing severe three-vessel coronary artery disease, moderate plaquing in her aorta.  Last echocardiogram December 2015 showing ejection fraction 20-25% which was down from 30-35% in August 2013  Echo done in the hospital 05/17/2014 was reviewed with her showing ejection fraction 20-25%, mildly elevated right trigger systolic pressures  Previous hospital admission in 2012 she was found to have CHF, moderate bilateral pleural effusions with BNP of 26,000. Underlying anemia was a significant component of her presentation. Previously received procrit every other week, bone marrow biopsy, received packed red blood cells x2, iron transfusion x2, followed by Dr. Ma Hillock.  Echocardiogram in the hospital showed ejection fraction 30-35%, mild to moderate right ventricular systolic pressure estimated at 40-50 mmHg  Echocardiogram performed February 11, 2011 shows ejection fraction less than 25% with severe global hypokinesis, apical akinesis, normal right ventricular systolic function , mild to moderate mitral valve regurgitation, normal right ventricular systolic pressures  Stress test done in the hospital on February 13 2011 shows no significant ischemia.   PMH:   has a past medical history of Allergy; Arthritis; Carotid arterial disease ([place]); Chronic combined  systolic and diastolic CHF (congestive heart failure) ([place]); Chronic kidney disease (CKD), stage II (mild); COPD (chronic obstructive pulmonary disease) ([place]); Dilated cardiomyopathy ([place]); GERD (gastroesophageal reflux disease); History of abnormal Pap smear; History of diverticulosis; colonic polyps; Hyperkalemia; Hypertension; Iron deficiency anemia; Pulmonary HTN ([place]); Stroke [facility]); TIA (transient ischemic attack); and Tobacco abuse.  PSH:    Past Surgical History:  Procedure Laterality Date  . CARDIAC CATHETERIZATION Left 08/05/2015   Procedure: Left Heart Cath and Coronary Angiography;  Surgeon: Minna Merritts, MD;  Location: [facility];  Service: Cardiovascular;  Laterality: Left;  . COLONOSCOPY  10/2010  . COLONOSCOPY WITH PROPOFOL N/A 12/15/2016   Procedure: COLONOSCOPY WITH PROPOFOL;  Surgeon: [name], MD;  Location: [facility];  Service: Endoscopy;  Laterality: N/A;  . ESOPHAGOGASTRODUODENOSCOPY (EGD) WITH PROPOFOL N/A 12/15/2016   Procedure: ESOPHAGOGASTRODUODENOSCOPY (EGD) WITH PROPOFOL;  Surgeon: [name], MD;  Location: [facility] ENDOSCOPY;  Service: Endoscopy;  Laterality: N/A;  . ORIF DISTAL RADIUS FRACTURE  2011   right  . TONSILLECTOMY  age 33  . VAGINAL HYSTERECTOMY  1981   secondary to abnormal pap smear    Current Outpatient Prescriptions  Medication Sig Dispense Refill  . albuterol (PROVENTIL) (2.5 MG/3ML) 0.083% nebulizer solution USE ONE TREATMENT EVERY 6 HOURS AS NEEDED 180 mL 1  . aspirin EC 81 MG tablet Take 81 mg by mouth daily.    Marland Kitchen atorvastatin (LIPITOR) 40 MG tablet TAKE 1 TABLET (40 MG TOTAL) BY MOUTH DAILY. (Patient taking differently: Take  20 mg by mouth daily. ) 30 tablet 3  . carvedilol (COREG) 6.25 MG tablet Take 6.25 mg by mouth 2 (two) times daily with a meal.  3  . citalopram (CELEXA) 10 MG tablet TAKE 1 TABLET (10 MG TOTAL) BY MOUTH DAILY. 30 tablet 3  . clopidogrel (PLAVIX) 75 MG tablet TAKE 1 TABLET (75 MG TOTAL) BY  MOUTH DAILY. 30 tablet 5  . Cyanocobalamin 1000 MCG/ML KIT Inject 1 mL as directed every 30 (thirty) days.    . Fe Fum-FePoly-Vit C-Vit B3 (INTEGRA) 62.5-62.5-40-3 MG CAPS One per day 30 capsule 2  . furosemide (LASIX) 20 MG tablet TAKE 1 TABLET (20 MG TOTAL) BY MOUTH DAILY AS NEEDED. 30 tablet 2  . gabapentin (NEURONTIN) 300 MG capsule Take 1 capsule (300 mg total) by mouth 3 (three) times daily. (Patient taking differently: Take 400 mg by mouth 2 (two) times daily. ) 270 capsule 1  . LORazepam (ATIVAN) 0.5 MG tablet Take 1 tablet (0.5 mg total) by mouth every 8 (eight) hours as needed for anxiety. 30 tablet 0  . ondansetron (ZOFRAN) 4 MG tablet Take 1 tablet (4 mg total) by mouth every 6 (six) hours as needed for nausea. 20 tablet 0  . pantoprazole (PROTONIX) 40 MG tablet Take 1 tablet (40 mg total) by mouth 2 (two) times daily. 60 tablet 1  . SPIRIVA HANDIHALER 18 MCG inhalation capsule INHALE ONCE DAILY 30 capsule 5  . VENTOLIN HFA 108 (90 Base) MCG/ACT inhaler INHALE 2 PUFFS EVERY 6 HOURS AS NEEDED FOR WHEEZING OR SHORTNESS OF BREATH 18 Inhaler 3  . Vitamin D, Ergocalciferol, (DRISDOL) 50000 units CAPS capsule Take 50,000 Units by mouth every 7 (seven) days.     No current facility-administered medications for this visit.      Allergies:   Ranitidine; Ranitidine hcl; and Entresto [sacubitril-valsartan]   Social History:  The patient  reports that she has been smoking Cigarettes.  She has a 21.00 pack-year smoking history. She has never used smokeless tobacco. She reports that she does not drink alcohol or use drugs.   Family History:   family history includes Alcohol abuse in her father; Arthritis in her father and mother; Cancer in her father.    Review of Systems: Review of Systems  Constitutional: Negative.        Weight gain  Respiratory: Negative.   Cardiovascular: Negative.   Gastrointestinal: Negative.        Abdominal bloating  Musculoskeletal: Negative.   Neurological:  Negative.   Psychiatric/Behavioral: Negative.   All other systems reviewed and are negative.    PHYSICAL EXAM: VS:  There were no vitals taken for this visit. , BMI There is no height or weight on file to calculate BMI. GEN: Well nourished, well developed, in no acute distress  HEENT: normal  Neck: no JVD, carotid bruits, or masses Cardiac: RRR; S4, no murmurs, rubs, or gallops, No significant lower extremity edema Respiratory:  Mildly decreased breath sounds throughout,  normal work of breathing GI: soft, nontender, nondistended, + BS MS: no deformity or atrophy  Skin: warm and dry, no rash Neuro:  Strength and sensation are intact Psych: euthymic mood, full affect    Recent Labs: 10/13/2016: TSH 3.39 12/15/2016: ALT 25 12/16/2016: BUN 38; Creatinine, Ser 2.01; Hemoglobin 14.4; Platelets 175; Potassium 4.3; Sodium 136    Lipid Panel Lab Results  Component Value Date   CHOL 91 03/26/2016   HDL 30.60 (L) 03/26/2016   LDLCALC 39 03/26/2016   TRIG  105.0 03/26/2016      Wt Readings from Last 3 Encounters:  12/16/16 136 lb (61.7 kg)  11/12/16 137 lb (62.1 kg)  10/13/16 125 lb (56.7 kg)        ASSESSMENT AND PLAN:   Hyperlipidemia, unspecified hyperlipidemia type -   stay on her Lipitor, goal LDL<70  Coronary artery disease involving native coronary artery of native heart with angina pectoris with documented spasm ([place])  Recommended smoking cessation Currently with no symptoms of angina. No further workup at this time. Continue current medication regimen.  Nonischemic cardiomyopathy (HCC) -  Depressed ejection fraction out of proportion to coronary artery disease Unable to tolerate ARB's, Aldactone secondary to hyperkalemia Previously on lisinopril, minimal increase in potassium Recommended she stay on low-dose lisinopril, beta blocker, diuretic  Chronic systolic CHF (congestive heart failure) (HCC) -  Weight is back down to her baseline 121 pounds Recommended  she take Lasix 20 or 40 mg daily when necessary for weight 124 up to 125 pounds She had prerenal state 2 weeks ago from taking too much Lasix Currently feels back to her baseline She is on beta blocker, ACE inhibitor She is on beta blocker, low-dose ACE inhibitor  Tobacco abuse - Plan: EKG 52-ZGFU, Basic Metabolic Panel (BMET) 5 minutes of smoking cessation discussion today, she does not want help  Significant discussion concerning CHF, CHF education provided We did call CHF clinic for a scale, they are unable to provide Recommended she buy her own scale  Total encounter time more than 25 minutes  Greater than 50% was spent in counseling and coordination of care with the patient  Disposition:   F/U  3 months  No orders of the defined types were placed in this encounter.    Signed, Esmond Plants, M.D., Ph.D. 12/18/2016  [place]  This encounter was created in error - please disregard.

## 2016-12-20 ENCOUNTER — Other Ambulatory Visit: Payer: Self-pay | Admitting: Cardiovascular Disease

## 2016-12-20 LAB — CULTURE, BLOOD (ROUTINE X 2)
Culture: NO GROWTH
Culture: NO GROWTH
SPECIAL REQUESTS: ADEQUATE
SPECIAL REQUESTS: ADEQUATE

## 2016-12-21 ENCOUNTER — Telehealth: Payer: Self-pay

## 2016-12-21 ENCOUNTER — Encounter: Payer: Self-pay | Admitting: Cardiovascular Disease

## 2016-12-21 ENCOUNTER — Encounter: Payer: Medicare HMO | Admitting: Cardiovascular Disease

## 2016-12-21 VITALS — BP 138/82 | HR 69 | Ht 65.0 in | Wt 141.8 lb

## 2016-12-21 DIAGNOSIS — M5412 Radiculopathy, cervical region: Secondary | ICD-10-CM | POA: Diagnosis not present

## 2016-12-21 DIAGNOSIS — M170 Bilateral primary osteoarthritis of knee: Secondary | ICD-10-CM | POA: Diagnosis not present

## 2016-12-21 NOTE — Telephone Encounter (Signed)
Notified patient as instructed, patient pleased. Discussed follow-up appointments, patient agrees. Patient placed in recalls.   

## 2016-12-21 NOTE — Telephone Encounter (Signed)
Patient agreed.  

## 2016-12-21 NOTE — Telephone Encounter (Signed)
-----   Message from [name], MD sent at 12/21/2016  2:31 PM EDT ----- Notify all polyps OK. F/U in three years.  No need to check small intestines at this time.  ----- Message ----- From: Interface, Lab In Three Zero One Sent: 12/18/2016  12:44 PM To: [name], MD

## 2016-12-22 ENCOUNTER — Ambulatory Visit (INDEPENDENT_AMBULATORY_CARE_PROVIDER_SITE_OTHER): Payer: Medicare HMO | Admitting: Cardiovascular Disease

## 2016-12-22 ENCOUNTER — Encounter: Payer: Self-pay | Admitting: Cardiovascular Disease

## 2016-12-22 VITALS — BP 100/72 | HR 75 | Ht 65.0 in | Wt 141.8 lb

## 2016-12-22 DIAGNOSIS — M7989 Other specified soft tissue disorders: Secondary | ICD-10-CM | POA: Diagnosis not present

## 2016-12-22 DIAGNOSIS — I5022 Chronic systolic (congestive) heart failure: Secondary | ICD-10-CM

## 2016-12-22 DIAGNOSIS — I779 Disorder of arteries and arterioles, unspecified: Secondary | ICD-10-CM

## 2016-12-22 DIAGNOSIS — I10 Essential (primary) hypertension: Secondary | ICD-10-CM

## 2016-12-22 DIAGNOSIS — I5042 Chronic combined systolic (congestive) and diastolic (congestive) heart failure: Secondary | ICD-10-CM | POA: Diagnosis not present

## 2016-12-22 DIAGNOSIS — M25561 Pain in right knee: Secondary | ICD-10-CM | POA: Diagnosis not present

## 2016-12-22 DIAGNOSIS — I420 Dilated cardiomyopathy: Secondary | ICD-10-CM | POA: Diagnosis not present

## 2016-12-22 DIAGNOSIS — I272 Pulmonary hypertension, unspecified: Secondary | ICD-10-CM | POA: Diagnosis not present

## 2016-12-22 DIAGNOSIS — I25118 Atherosclerotic heart disease of native coronary artery with other forms of angina pectoris: Secondary | ICD-10-CM | POA: Diagnosis not present

## 2016-12-22 DIAGNOSIS — M5412 Radiculopathy, cervical region: Secondary | ICD-10-CM | POA: Diagnosis not present

## 2016-12-22 DIAGNOSIS — I739 Peripheral vascular disease, unspecified: Secondary | ICD-10-CM

## 2016-12-22 DIAGNOSIS — M25562 Pain in left knee: Secondary | ICD-10-CM | POA: Diagnosis not present

## 2016-12-22 DIAGNOSIS — J432 Centrilobular emphysema: Secondary | ICD-10-CM | POA: Diagnosis not present

## 2016-12-22 MED ORDER — FUROSEMIDE 20 MG PO TABS: 40.0000 mg | ORAL_TABLET | Freq: Two times a day (BID) | ORAL | 6 refills | Status: DC | PRN

## 2016-12-22 NOTE — Patient Instructions (Addendum)
Medication Instructions:   Take lasix 40 mg daily until weight back to 130 Watch fluid intake  If no improvement, Take lasix 40 mg twice a day (8 and and 2 pm)  GOAL weight <130  Labwork:  No new labs needed  Testing/Procedures:  No further testing at this time   Follow-Up: It was a pleasure seeing you in the office today. Please call us if you have new issues that need to be addressed before your next appt.  [phone]  Your physician wants you to follow-up in: 3 months.  You will receive a reminder letter in the mail two months in advance. If you don't receive a letter, please call our office to schedule the follow-up appointment.  If you need a refill on your cardiac medications before your next appointment, please call your pharmacy.

## 2016-12-22 NOTE — Progress Notes (Signed)
Cardiology Office Note  Date:  12/22/2016   ID:  Sandra Ellison, DOB [birth year], MRN [id number]  PCP:  Einar Pheasant, MD   Chief Complaint  Patient presents with  . OTHER    3 month f/u no complaints today. Meds reviewed verbally with pt.    HPI:   71 year old woman with a history of  Pulmonary hypertension,  systolic and diastolic CHF with ejection fraction 25%,  COPD  Active smoker, catheterization February 2017 with moderate left main disease,  decreased pulse in her left arm,  peptic ulcer disease, anemia, chronic renal insufficiency who presents for follow-up of her cardiomyopathy.  History of hyperkalemia on  ARBs/Aldactone Who presents for routine follow-up of her cardiomyopathy  Discharge from the hospital 12/16/2016 with shortness of breath, respiratory failure, anemia She had EGD and colonoscopy   PACU area patient was found to be tachypneic, febrile at 100.6.  BiPAP.  chest x-ray does not show any infiltrates or pulmonary edema.  Dual neb nebulizer has been given.  some right shoulder pain which is chronic pain   No significant weight gain, leg swelling, abdominal bloating  Weight up 20 pounds,  She is not taking her Lasix on a regular basis, high fluid intake Unclear why she is not on her regular medication She did not grasp the severity of her leg swelling Husband presents with her today Legs are tight, shiny Denies having significant leg pain but she is presenting in a wheelchair  Other past medical history reviewed Depressed ejection fraction out of proportion to coronary artery disease Unable to tolerate ARB's, Aldactone secondary to hyperkalemia Previously on lisinopril, minimal increase in potassium  stroke in June 2017 Left side deficits,gait instability Acute stroke seen on MRI left cerebellum Carotid ultrasound showing mild to moderate carotid disease bilaterally Echocardiogram during the hospital course showing ejection fraction less than  25% discharged on Plavix  peptic ulcer disease, cardiomyopathy with history of systolic and diastolic CHF, COPD with long smoking history who continues to smoke, pulmonary hypertension seen on echocardiogram in March 2012 who presented to [facility] with increasing shortness of breath on February 11, 2011, chronic renal insufficiency, stage II, severe iron deficiency anemia, history of systolic heart failure, cardiac catheterization February 2017 for worsening ejection fraction found to have moderate left main disease, who presents for follow-up of her systolic CHF. Decreased pulse in the left arm  She has neuropathy in her legs. Other workup including CT scan of the chest 06/19/2015 showing coronary calcifications, aortic atherosclerosis. Images reviewed with her in detail  Ultrasound of the abdomen showing kidney stones, sludge in the gallbladder, atrophic left kidney. Results discussed with her She continues to smoke  Previously taken off entresto and ARB's for high potassium bidil caused headaches Reports she is tolerating hydralazine Was previously on lisinopril 10 mg daily several years ago and reports having no side effect  cardiac catheterization details : LM lesion, 65% stenosed, Ost LAD lesion, 50% stenosed, Dist Cx to LPDA lesion, 80% stenosed, 2nd Mrg lesion, 40% stenosed.  Seen in the emergency room twice, once in 05/29/2015 for shortness of breath with cough felt to be from COPD exacerbation, given a Z-Pak and steroids Second trip to the ER 07/18/2015 for nausea vomiting felt to be a GI bug  Recent CT scan of the chest reviewed with her in detail showing severe three-vessel coronary artery disease, moderate plaquing in her aorta.  Last echocardiogram December 2015 showing ejection fraction 20-25% which was down from 30-35% in August 2013  Echo done in the hospital 05/17/2014 was reviewed with her showing ejection fraction 20-25%, mildly elevated right trigger systolic  pressures  Previous hospital admission in 2012 she was found to have CHF, moderate bilateral pleural effusions with BNP of 26,000. Underlying anemia was a significant component of her presentation. Previously received procrit every other week, bone marrow biopsy, received packed red blood cells x2, iron transfusion x2, followed by Dr. Ma Hillock.  Echocardiogram in the hospital showed ejection fraction 30-35%, mild to moderate right ventricular systolic pressure estimated at 40-50 mmHg  Echocardiogram performed February 11, 2011 shows ejection fraction less than 25% with severe global hypokinesis, apical akinesis, normal right ventricular systolic function , mild to moderate mitral valve regurgitation, normal right ventricular systolic pressures  Stress test done in the hospital on February 13 2011 shows no significant ischemia.   PMH:   has a past medical history of Allergy; Arthritis; Carotid arterial disease ([place]); Chronic combined systolic and diastolic CHF (congestive heart failure) ([place]); Chronic kidney disease (CKD), stage II (mild); COPD (chronic obstructive pulmonary disease) ([place]); Dilated cardiomyopathy ([place]); GERD (gastroesophageal reflux disease); History of abnormal Pap smear; History of diverticulosis; colonic polyps; Hyperkalemia; Hypertension; Iron deficiency anemia; Pulmonary HTN ([place]); Stroke [facility]); TIA (transient ischemic attack); and Tobacco abuse.  PSH:    Past Surgical History:  Procedure Laterality Date  . CARDIAC CATHETERIZATION Left 08/05/2015   Procedure: Left Heart Cath and Coronary Angiography;  Surgeon: Minna Merritts, MD;  Location: [place] [facility];  Service: Cardiovascular;  Laterality: Left;  . COLONOSCOPY  10/2010  . COLONOSCOPY WITH PROPOFOL N/A 12/15/2016   Procedure: COLONOSCOPY WITH PROPOFOL;  Surgeon: [name], MD;  Location: [facility] ENDOSCOPY;  Service: Endoscopy;  Laterality: N/A;  . ESOPHAGOGASTRODUODENOSCOPY (EGD) WITH PROPOFOL N/A  12/15/2016   Procedure: ESOPHAGOGASTRODUODENOSCOPY (EGD) WITH PROPOFOL;  Surgeon: [name], MD;  Location: [facility] ENDOSCOPY;  Service: Endoscopy;  Laterality: N/A;  . ORIF DISTAL RADIUS FRACTURE  2011   right  . TONSILLECTOMY  age 70  . VAGINAL HYSTERECTOMY  1981   secondary to abnormal pap smear    Current Outpatient Prescriptions  Medication Sig Dispense Refill  . albuterol (PROVENTIL) (2.5 MG/3ML) 0.083% nebulizer solution USE ONE TREATMENT EVERY 6 HOURS AS NEEDED 180 mL 1  . aspirin EC 81 MG tablet Take 81 mg by mouth daily.    Marland Kitchen atorvastatin (LIPITOR) 40 MG tablet TAKE 1 TABLET (40 MG TOTAL) BY MOUTH DAILY. (Patient taking differently: Take 20 mg by mouth daily. ) 30 tablet 3  . carvedilol (COREG) 6.25 MG tablet Take 6.25 mg by mouth 2 (two) times daily with a meal.  3  . carvedilol (COREG) 6.25 MG tablet TAKE 1 TABLET (6.25 MG TOTAL) BY MOUTH 2 (TWO) TIMES DAILY WITH A MEAL. 60 tablet 0  . citalopram (CELEXA) 10 MG tablet TAKE 1 TABLET (10 MG TOTAL) BY MOUTH DAILY. 30 tablet 3  . clopidogrel (PLAVIX) 75 MG tablet TAKE 1 TABLET (75 MG TOTAL) BY MOUTH DAILY. 30 tablet 5  . Cyanocobalamin 1000 MCG/ML KIT Inject 1 mL as directed every 30 (thirty) days.    . Fe Fum-FePoly-Vit C-Vit B3 (INTEGRA) 62.5-62.5-40-3 MG CAPS One per day 30 capsule 2  . furosemide (LASIX) 20 MG tablet TAKE 1 TABLET (20 MG TOTAL) BY MOUTH DAILY AS NEEDED. 30 tablet 2  . gabapentin (NEURONTIN) 300 MG capsule Take 1 capsule (300 mg total) by mouth 3 (three) times daily. (Patient taking differently: Take 400 mg by mouth 2 (two) times  daily. ) 270 capsule 1  . LORazepam (ATIVAN) 0.5 MG tablet Take 1 tablet (0.5 mg total) by mouth every 8 (eight) hours as needed for anxiety. 30 tablet 0  . ondansetron (ZOFRAN) 4 MG tablet Take 1 tablet (4 mg total) by mouth every 6 (six) hours as needed for nausea. 20 tablet 0  . pantoprazole (PROTONIX) 40 MG tablet Take 1 tablet (40 mg total) by mouth 2 (two) times daily. 60 tablet  1  . predniSONE (DELTASONE) 10 MG tablet Take 10 mg by mouth as directed.    Marland Kitchen SPIRIVA HANDIHALER 18 MCG inhalation capsule INHALE ONCE DAILY 30 capsule 5  . VENTOLIN HFA 108 (90 Base) MCG/ACT inhaler INHALE 2 PUFFS EVERY 6 HOURS AS NEEDED FOR WHEEZING OR SHORTNESS OF BREATH 18 Inhaler 3  . Vitamin D, Ergocalciferol, (DRISDOL) 50000 units CAPS capsule Take 50,000 Units by mouth every 7 (seven) days.     No current facility-administered medications for this visit.      Allergies:   Ranitidine; Ranitidine hcl; and Entresto [sacubitril-valsartan]   Social History:  The patient  reports that she has been smoking Cigarettes.  She has a 21.00 pack-year smoking history. She has never used smokeless tobacco. She reports that she does not drink alcohol or use drugs.   Family History:   family history includes Alcohol abuse in her father; Arthritis in her father and mother; Cancer in her father.    Review of Systems: Review of Systems  Constitutional: Negative.        Weight gain  Respiratory: Negative.   Cardiovascular: Positive for leg swelling.  Gastrointestinal: Negative.        Abdominal bloating  Musculoskeletal: Negative.   Neurological: Negative.   Psychiatric/Behavioral: Negative.   All other systems reviewed and are negative.    PHYSICAL EXAM: VS:  BP 100/72 (BP Location: Left Arm, Patient Position: Sitting, Cuff Size: Normal)   Pulse 75   Ht 5' 5"  (1.651 m)   Wt 141 lb 12 oz (64.3 kg)   BMI 23.59 kg/m  , BMI Body mass index is 23.59 kg/m.  GEN: Well nourished, well developed, in no acute distress  HEENT: normal  Neck: no JVD, carotid bruits, or masses Cardiac: RRR; S4, no murmurs, rubs, or gallops, 1-2 + lower extremity edema Respiratory:  Mildly decreased breath sounds throughout,  normal work of breathing GI: soft, nontender, nondistended, + BS MS: no deformity or atrophy  Skin: warm and dry, no rash Neuro:  Strength and sensation are intact Psych: euthymic mood,  full affect    Recent Labs: 10/13/2016: TSH 3.39 12/15/2016: ALT 25 12/16/2016: BUN 38; Creatinine, Ser 2.01; Hemoglobin 14.4; Platelets 175; Potassium 4.3; Sodium 136    Lipid Panel Lab Results  Component Value Date   CHOL 91 03/26/2016   HDL 30.60 (L) 03/26/2016   LDLCALC 39 03/26/2016   TRIG 105.0 03/26/2016      Wt Readings from Last 3 Encounters:  12/22/16 141 lb 12 oz (64.3 kg)  12/21/16 141 lb 12 oz (64.3 kg)  12/16/16 136 lb (61.7 kg)        ASSESSMENT AND PLAN:   Hyperlipidemia, unspecified hyperlipidemia type -   stay on her Lipitor, goal LDL<70  Coronary artery disease involving native coronary artery of native heart with angina pectoris with documented spasm (HCC)  Recommended smoking cessation Currently with no symptoms of angina. No further workup at this time. Continue current medication regimen.  Nonischemic cardiomyopathy (HCC) -  Depressed ejection fraction out  of proportion to coronary artery disease Unable to tolerate ARB's, Aldactone secondary to hyperkalemia Previously on lisinopril, minimal increase in potassium Stressed importance of getting back on her Lasix 20 pound weight gain since her last clinic visit April 2018 She will restart Lasix 40 daily Goal weight at least 131 pounds  Chronic systolic CHF (congestive heart failure) ([place]) -  Recommended she take Lasix 40 mg daily while her legs and weight are grossly abnormal She is on beta blocker, ACE inhibitor  Tobacco abuse - Plan: EKG 70-LKHV, Basic Metabolic Panel (BMET) 5 minutes of smoking cessation discussion today, she does not want help  Significant discussion concerning CHF, CHF education provided  Total encounter time more than 25 minutes  Greater than 50% was spent in counseling and coordination of care with the patient  Disposition:   F/U  3 months  No orders of the defined types were placed in this encounter.    Signed, Esmond Plants, M.D., Ph.D. 12/22/2016  [place], [place]

## 2016-12-23 NOTE — Addendum Note (Signed)
Addended by: Dede Query R on: 12/23/2016 04:57 PM   Modules accepted: Orders

## 2016-12-28 ENCOUNTER — Other Ambulatory Visit (INDEPENDENT_AMBULATORY_CARE_PROVIDER_SITE_OTHER): Payer: Self-pay | Admitting: Nephrology

## 2016-12-28 DIAGNOSIS — M5412 Radiculopathy, cervical region: Secondary | ICD-10-CM | POA: Diagnosis not present

## 2016-12-28 DIAGNOSIS — I10 Essential (primary) hypertension: Secondary | ICD-10-CM

## 2016-12-28 DIAGNOSIS — M25561 Pain in right knee: Secondary | ICD-10-CM | POA: Diagnosis not present

## 2016-12-28 DIAGNOSIS — M25562 Pain in left knee: Secondary | ICD-10-CM | POA: Diagnosis not present

## 2016-12-30 ENCOUNTER — Emergency Department: Payer: Medicare HMO

## 2016-12-30 ENCOUNTER — Emergency Department
Admission: EM | Admit: 2016-12-30 | Discharge: 2016-12-30 | Disposition: A | Discharge status: Home / Self Care | Payer: Medicare HMO | Attending: Student in an Organized Health Care Education/Training Program | Admitting: Student in an Organized Health Care Education/Training Program

## 2016-12-30 ENCOUNTER — Other Ambulatory Visit (INDEPENDENT_AMBULATORY_CARE_PROVIDER_SITE_OTHER): Payer: Self-pay | Admitting: Nephrology

## 2016-12-30 ENCOUNTER — Ambulatory Visit (INDEPENDENT_AMBULATORY_CARE_PROVIDER_SITE_OTHER): Payer: Medicare HMO

## 2016-12-30 DIAGNOSIS — Z7902 Long term (current) use of antithrombotics/antiplatelets: Secondary | ICD-10-CM | POA: Insufficient documentation

## 2016-12-30 DIAGNOSIS — I5042 Chronic combined systolic (congestive) and diastolic (congestive) heart failure: Secondary | ICD-10-CM | POA: Insufficient documentation

## 2016-12-30 DIAGNOSIS — I129 Hypertensive chronic kidney disease with stage 1 through stage 4 chronic kidney disease, or unspecified chronic kidney disease: Secondary | ICD-10-CM

## 2016-12-30 DIAGNOSIS — Z79899 Other long term (current) drug therapy: Secondary | ICD-10-CM | POA: Diagnosis not present

## 2016-12-30 DIAGNOSIS — F17210 Nicotine dependence, cigarettes, uncomplicated: Secondary | ICD-10-CM | POA: Insufficient documentation

## 2016-12-30 DIAGNOSIS — J449 Chronic obstructive pulmonary disease, unspecified: Secondary | ICD-10-CM | POA: Diagnosis not present

## 2016-12-30 DIAGNOSIS — N183 Chronic kidney disease, stage 3 unspecified: Secondary | ICD-10-CM

## 2016-12-30 DIAGNOSIS — Z7982 Long term (current) use of aspirin: Secondary | ICD-10-CM | POA: Diagnosis not present

## 2016-12-30 DIAGNOSIS — R519 Headache, unspecified: Secondary | ICD-10-CM

## 2016-12-30 DIAGNOSIS — N182 Chronic kidney disease, stage 2 (mild): Secondary | ICD-10-CM | POA: Insufficient documentation

## 2016-12-30 DIAGNOSIS — E875 Hyperkalemia: Secondary | ICD-10-CM

## 2016-12-30 DIAGNOSIS — Z8673 Personal history of transient ischemic attack (TIA), and cerebral infarction without residual deficits: Secondary | ICD-10-CM | POA: Insufficient documentation

## 2016-12-30 DIAGNOSIS — R51 Headache: Secondary | ICD-10-CM | POA: Diagnosis not present

## 2016-12-30 DIAGNOSIS — I2510 Atherosclerotic heart disease of native coronary artery without angina pectoris: Secondary | ICD-10-CM | POA: Diagnosis not present

## 2016-12-30 LAB — BASIC METABOLIC PANEL
Anion gap: 13 (ref 5–15)
BUN: 42 mg/dL — ABNORMAL HIGH (ref 6–20)
CALCIUM: 8.7 mg/dL — AB (ref 8.9–10.3)
CO2: 30 mmol/L (ref 22–32)
CREATININE: 1.67 mg/dL — AB (ref 0.44–1.00)
Chloride: 97 mmol/L — ABNORMAL LOW (ref 101–111)
GFR calc Af Amer: 35 mL/min — ABNORMAL LOW (ref >60)
GFR, EST NON AFRICAN AMERICAN: 30 mL/min — AB (ref >60)
GLUCOSE: 91 mg/dL (ref 65–99)
Potassium: 3.9 mmol/L (ref 3.5–5.1)
Sodium: 140 mmol/L (ref 135–145)

## 2016-12-30 LAB — CBC WITH DIFFERENTIAL/PLATELET
BASOS ABS: 0 10*3/uL (ref 0–0.1)
Basophils Relative: 0 %
EOS PCT: 1 %
Eosinophils Absolute: 0.1 10*3/uL (ref 0–0.7)
HCT: 44.8 % (ref 35.0–47.0)
Hemoglobin: 15 g/dL (ref 12.0–16.0)
LYMPHS PCT: 7 %
Lymphs Abs: 0.5 10*3/uL — ABNORMAL LOW (ref 1.0–3.6)
MCH: 31.9 pg (ref 26.0–34.0)
MCHC: 33.5 g/dL (ref 32.0–36.0)
MCV: 95.1 fL (ref 80.0–100.0)
MONO ABS: 0.8 10*3/uL (ref 0.2–0.9)
Monocytes Relative: 11 %
Neutro Abs: 6.2 10*3/uL (ref 1.4–6.5)
Neutrophils Relative %: 81 %
PLATELETS: 181 10*3/uL (ref 150–440)
RBC: 4.71 MIL/uL (ref 3.80–5.20)
RDW: 14.6 % — AB (ref 11.5–14.5)
WBC: 7.6 10*3/uL (ref 3.6–11.0)

## 2016-12-30 LAB — SEDIMENTATION RATE: Sed Rate: 3 mm/hr (ref 0–30)

## 2016-12-30 MED ORDER — ACETAMINOPHEN 500 MG PO TABS
1000.0000 mg | ORAL_TABLET | Freq: Once | ORAL | Status: AC
  Administered 2016-12-30: 1000 mg via ORAL
  Filled 2016-12-30: qty 2

## 2016-12-30 MED ORDER — SODIUM CHLORIDE 0.9 % IV BOLUS (SEPSIS)
500.0000 mL | Freq: Once | INTRAVENOUS | Status: AC
  Administered 2016-12-30: 500 mL via INTRAVENOUS

## 2016-12-30 MED ORDER — MAGNESIUM SULFATE 2 GM/50ML IV SOLN
2.0000 g | Freq: Once | INTRAVENOUS | Status: AC
  Administered 2016-12-30: 2 g via INTRAVENOUS
  Filled 2016-12-30: qty 50

## 2016-12-30 MED ORDER — PROCHLORPERAZINE EDISYLATE 5 MG/ML IJ SOLN
10.0000 mg | Freq: Once | INTRAMUSCULAR | Status: AC
  Administered 2016-12-30: 10 mg via INTRAVENOUS
  Filled 2016-12-30: qty 2

## 2016-12-30 MED ORDER — IOPAMIDOL (ISOVUE-370) INJECTION 76%
60.0000 mL | Freq: Once | INTRAVENOUS | Status: AC | PRN
  Administered 2016-12-30: 60 mL via INTRAVENOUS

## 2016-12-30 MED ORDER — DIPHENHYDRAMINE HCL 50 MG/ML IJ SOLN
12.5000 mg | Freq: Once | INTRAMUSCULAR | Status: AC
  Administered 2016-12-30: 12.5 mg via INTRAVENOUS
  Filled 2016-12-30: qty 1

## 2016-12-30 MED ORDER — DEXAMETHASONE SODIUM PHOSPHATE 10 MG/ML IJ SOLN
10.0000 mg | Freq: Once | INTRAMUSCULAR | Status: AC
  Administered 2016-12-30: 10 mg via INTRAVENOUS
  Filled 2016-12-30: qty 1

## 2016-12-30 NOTE — ED Provider Notes (Signed)
[facility] Emergency Department Provider Note    First MD Initiated Contact with Patient 12/30/16 1306     (approximate)  I have reviewed the triage vital signs and the nursing notes.   HISTORY  Chief Complaint Headache    HPI Sandra Ellison is a 71 y.o. female with multiple comorbidities presents with a new onset headache that started right around 7:30 this morning. Patient states that she does have a history of migraine headaches and states this feels somewhat similar but her migraine headaches are typically on the left side this was on the right. States it was gradual in onset. Denies any photophobia or phonophobia. Denies any blurry vision. No numbness or tingling. States that this is one of the worst headaches of her life. States that she took OTC arthritis medication is not had any improvement in her headache. No nausea or vomiting. No recent fevers. Denies any neck pain.   Past Medical History:  Diagnosis Date  . Allergy   . Arthritis   . Carotid arterial disease ([place])    a. 02/2015 Carotid U/S: RICA 86-57%, LICA 84-69%.  . Chronic combined systolic and diastolic CHF (congestive heart failure) ([place])    a. 01/2012 Echo: EF 30-35%, mod conc LVH, sev inf HK, mildly dil LA, mild-mod MR, Trace TR, RVSP 40-37mHg.    .Marland KitchenChronic kidney disease (CKD), stage II (mild)   . COPD (chronic obstructive pulmonary disease) ([place]   . Dilated cardiomyopathy ([place]    a. 02/2011 MV: EF 27%, anteroseptal, inf, inferolateral scar, no ischemia.  .Marland KitchenGERD (gastroesophageal reflux disease)   . History of abnormal Pap smear    a. s/p vaginal hysterectomy  . History of diverticulosis   . Hx of colonic polyps   . Hyperkalemia    a. 02/2015 in setting of entresto/AKI->entresto d/c'd.  . Hypertension   . Iron deficiency anemia    a. h/o transfusions, prev followed by heme/onc.  . Pulmonary HTN ([facility]   . Stroke ([place]   . TIA (transient ischemic attack)   . Tobacco abuse      Family History  Problem Relation Age of Onset  . Arthritis Mother   . Alcohol abuse Father   . Arthritis Father   . Cancer Father        throat/spine  . Breast cancer Neg Hx    Past Surgical History:  Procedure Laterality Date  . CARDIAC CATHETERIZATION Left 08/05/2015   Procedure: Left Heart Cath and Coronary Angiography;  Surgeon: TMinna Merritts MD;  [facility];  Service: Cardiovascular;  Laterality: Left;  . COLONOSCOPY  10/2010  . COLONOSCOPY WITH PROPOFOL N/A 12/15/2016   Procedure: COLONOSCOPY WITH PROPOFOL;  Surgeon: BRobert Bellow MD;  Location: [facility] ENDOSCOPY;  Service: Endoscopy;  Laterality: N/A;  . ESOPHAGOGASTRODUODENOSCOPY (EGD) WITH PROPOFOL N/A 12/15/2016   Procedure: ESOPHAGOGASTRODUODENOSCOPY (EGD) WITH PROPOFOL;  Surgeon: BRobert Bellow MD;  Location: [facility] ENDOSCOPY;  Service: Endoscopy;  Laterality: N/A;  . ORIF DISTAL RADIUS FRACTURE  2011   right  . TONSILLECTOMY  age 71 . VAGINAL HYSTERECTOMY  1981   secondary to abnormal pap smear   Patient Active Problem List   Diagnosis Date Noted  . Hypoxia 12/16/2016  . Acute respiratory failure ([place] 12/15/2016  . Rectal irritation 06/02/2016  . CVA (cerebral infarction) 11/25/2015  . TIA (transient ischemic attack) 11/24/2015  . Anxiety 08/31/2015  . Congestive dilated cardiomyopathy ([place]   . Cardiomyopathy, ischemic 07/24/2015  .  Foot swelling 06/15/2015  . Chronic combined systolic and diastolic CHF (congestive heart failure) ([place])   . Dilated cardiomyopathy ([place])   . Carotid arterial disease ([place])   . Tobacco abuse   . Essential hypertension 03/17/2015  . CKD (chronic kidney disease), stage III 03/06/2015  . Abnormal mammogram 02/23/2015  . Encounter for screening colonoscopy 01/26/2015  . Health care maintenance 08/12/2014  . Chronic systolic CHF (congestive heart failure) ([place]) 05/21/2014  . Microcalcifications of the breast 01/24/2014  . Knee pain, bilateral  07/22/2013  . Pulmonary nodule 05/20/2013  . Insomnia 03/08/2013  . Left knee pain 03/08/2013  . Ulnar nerve injury 03/07/2013  . Gastroesophageal reflux disease 02/28/2013  . History of colonic polyps 02/28/2013  . Diverticulosis 02/28/2013  . Anemia 02/28/2013  . COPD (chronic obstructive pulmonary disease) ([place]) 02/28/2013  . Pulmonary hypertension ([place]) 02/28/2013  . Nonischemic cardiomyopathy ([place]) 02/26/2012  . Carotid bruit 02/26/2012  . SOB (shortness of breath) 03/03/2011  . Smoking 03/03/2011  . Hyperlipidemia 03/03/2011  . CAD (coronary artery disease) 03/03/2011      Prior to Admission medications   Medication Sig Start Date End Date Taking? Authorizing Provider  albuterol (PROVENTIL) (2.5 MG/3ML) 0.083% nebulizer solution USE ONE TREATMENT EVERY 6 HOURS AS NEEDED 05/05/16  Yes Einar Pheasant, MD  aspirin EC 81 MG tablet Take 81 mg by mouth daily.   Yes [provider]  atorvastatin (LIPITOR) 40 MG tablet TAKE 1 TABLET (40 MG TOTAL) BY MOUTH DAILY. Patient taking differently: Take 20 mg by mouth daily.  10/19/16  Yes Rogelia Mire, NP  carvedilol (COREG) 6.25 MG tablet TAKE 1 TABLET (6.25 MG TOTAL) BY MOUTH 2 (TWO) TIMES DAILY WITH A MEAL. 12/21/16  Yes Gollan, Kathlene November, MD  citalopram (CELEXA) 10 MG tablet TAKE 1 TABLET (10 MG TOTAL) BY MOUTH DAILY. 10/19/16  Yes Einar Pheasant, MD  clopidogrel (PLAVIX) 75 MG tablet TAKE 1 TABLET (75 MG TOTAL) BY MOUTH DAILY. 07/22/16  Yes Einar Pheasant, MD  Cyanocobalamin 1000 MCG/ML KIT Inject 1 mL as directed every 30 (thirty) days.   Yes [provider]  furosemide (LASIX) 20 MG tablet Take 2 tablets (40 mg total) by mouth 2 (two) times daily as needed. 12/22/16  Yes Gollan, Kathlene November, MD  gabapentin (NEURONTIN) 100 MG capsule Take 100 mg by mouth 3 (three) times daily. 12/28/16  Yes [provider]  losartan (COZAAR) 25 MG tablet Take 25 mg by mouth daily. 12/28/16  Yes [provider]   ondansetron (ZOFRAN) 4 MG tablet Take 1 tablet (4 mg total) by mouth every 6 (six) hours as needed for nausea. 12/16/16  Yes Vaughan Basta, MD  pantoprazole (PROTONIX) 40 MG tablet Take 1 tablet (40 mg total) by mouth 2 (two) times daily. 12/16/16  Yes Vaughan Basta, MD  SPIRIVA HANDIHALER 18 MCG inhalation capsule INHALE ONCE DAILY 07/22/16  Yes Einar Pheasant, MD  VENTOLIN HFA 108 (90 Base) MCG/ACT inhaler INHALE 2 PUFFS EVERY 6 HOURS AS NEEDED FOR WHEEZING OR SHORTNESS OF BREATH 03/09/16  Yes Einar Pheasant, MD  Vitamin D, Ergocalciferol, (DRISDOL) 50000 units CAPS capsule Take 50,000 Units by mouth every 7 (seven) days.   Yes [provider]  VOLTAREN 1 % GEL Apply 2 g topically 4 (four) times daily. 12/21/16  Yes [provider]  Fe Fum-FePoly-Vit C-Vit B3 (INTEGRA) 62.5-62.5-40-3 MG CAPS One per day Patient not taking: Reported on 12/30/2016 06/12/15   Einar Pheasant, MD  gabapentin (NEURONTIN) 300 MG capsule Take 1  capsule (300 mg total) by mouth 3 (three) times daily. Patient not taking: Reported on 12/30/2016 09/09/16   Einar Pheasant, MD  LORazepam (ATIVAN) 0.5 MG tablet Take 1 tablet (0.5 mg total) by mouth every 8 (eight) hours as needed for anxiety. Patient not taking: Reported on 12/30/2016 05/07/16 05/07/17  Earleen Newport, MD    Allergies Ranitidine; Ranitidine hcl; and Entresto [sacubitril-valsartan]    Social History Social History  Substance Use Topics  . Smoking status: Current Every Day Smoker    Packs/day: 0.50    Years: 42.00    Types: Cigarettes  . Smokeless tobacco: Never Used     Comment: Down to 3 cigarettes per day  . Alcohol use No    Review of Systems Patient denies headaches, rhinorrhea, blurry vision, numbness, shortness of breath, chest pain, edema, cough, abdominal pain, nausea, vomiting, diarrhea, dysuria, fevers, rashes or hallucinations unless otherwise stated above in  HPI. ____________________________________________   PHYSICAL EXAM:  VITAL SIGNS: Vitals:   12/30/16 1358 12/30/16 1559  BP: 112/76 135/69  Pulse: 71 80  Resp: 18 18  Temp:      Constitutional: Alert and oriented. Laying in bed with eyes closed but speaks appropriately and laughing during history taking Eyes: Conjunctivae are normal.  Head: Atraumatic. Nose: No congestion/rhinnorhea. Mouth/Throat: Mucous membranes are moist.   Neck: No stridor. Painless ROM.  Cardiovascular: Normal rate, regular rhythm. Grossly normal heart sounds.  Good peripheral circulation. Respiratory: Normal respiratory effort.  No retractions. Lungs CTAB. Gastrointestinal: Soft and nontender. No distention.  Musculoskeletal: No lower extremity tenderness nor edema.  No joint effusions. Neurologic:  Normal speech and language. No gross focal neurologic deficits are appreciated. No facial droop Skin:  Skin is warm, dry and intact. No rash noted. Psychiatric: Mood and affect are normal. Speech and behavior are normal.  ____________________________________________   LABS (all labs ordered are listed, but only abnormal results are displayed)  Results for orders placed or performed during the hospital encounter of 12/30/16 (from the past 24 hour(s))  CBC with Differential/Platelet     Status: Abnormal   Collection Time: 12/30/16  2:01 PM  Result Value Ref Range   WBC 7.6 3.6 - 11.0 K/uL   RBC 4.71 3.80 - 5.20 MIL/uL   Hemoglobin 15.0 12.0 - 16.0 g/dL   HCT 44.8 35.0 - 47.0 %   MCV 95.1 80.0 - 100.0 fL   MCH 31.9 26.0 - 34.0 pg   MCHC 33.5 32.0 - 36.0 g/dL   RDW 14.6 (H) 11.5 - 14.5 %   Platelets 181 150 - 440 K/uL   Neutrophils Relative % 81 %   Neutro Abs 6.2 1.4 - 6.5 K/uL   Lymphocytes Relative 7 %   Lymphs Abs 0.5 (L) 1.0 - 3.6 K/uL   Monocytes Relative 11 %   Monocytes Absolute 0.8 0.2 - 0.9 K/uL   Eosinophils Relative 1 %   Eosinophils Absolute 0.1 0 - 0.7 K/uL   Basophils Relative 0 %    Basophils Absolute 0.0 0 - 0.1 K/uL  Basic metabolic panel     Status: Abnormal   Collection Time: 12/30/16  2:01 PM  Result Value Ref Range   Sodium 140 135 - 145 mmol/L   Potassium 3.9 3.5 - 5.1 mmol/L   Chloride 97 (L) 101 - 111 mmol/L   CO2 30 22 - 32 mmol/L   Glucose, Bld 91 65 - 99 mg/dL   BUN 42 (H) 6 - 20 mg/dL   Creatinine, Ser 1.67 (  H) 0.44 - 1.00 mg/dL   Calcium 8.7 (L) 8.9 - 10.3 mg/dL   GFR calc non Af Amer 30 (L) >60 mL/min   GFR calc Af Amer 35 (L) >60 mL/min   Anion gap 13 5 - 15  Sedimentation rate     Status: None   Collection Time: 12/30/16  2:01 PM  Result Value Ref Range   Sed Rate 3 0 - 30 mm/hr   ____________________________________________ ____________________________________________  RADIOLOGY  I personally reviewed all radiographic images ordered to evaluate for the above acute complaints and reviewed radiology reports and findings.  These findings were personally discussed with the patient.  Please see medical record for radiology report.  ____________________________________________   PROCEDURES  Procedure(s) performed:  Procedures    Critical Care performed: no ____________________________________________   INITIAL IMPRESSION / ASSESSMENT AND PLAN / ED COURSE  Pertinent labs & imaging results that were available during my care of the patient were reviewed by me and considered in my medical decision making (see chart for details).  DDX: tension, migraine,   Sandra Ellison is a 71 y.o. who presents to the ED p/w HA for this AM. Not worst HA ever. Gradual onset. HA similar to previous episodes. Denies focal neurologic symptoms. Denies trauma. No fevers or neck pain. No vision loss. Afebrile in ED. VSS. Exam as above. No meningeal signs. No CN, motor, sensory or cerebellar deficits. Temporal arteries palpable and non-tender. Appears well and non-toxic.   CT imaging ordered to eval for bleed was negative. Will provide IV fluids for hydration  and IV medications for symptom control.  Will order CTA to further eval for aneurysm or mass.   Clinical Course as of Dec 30 1732  Wed Dec 30, 2016  1524 Patient reassessed. Headache has improved but still having some discomfort. Awaiting results of CT angiogram.  [PR]  5686 CT imaging without any aneurysm or hemorrhage. Based on the timing of her symptoms do not feel this is clinically consistent with subarachnoid hemorrhage in the setting of negative CT and CTA.   [PR]  1728 Patient reassessed. States headache has improved. Discussed crusting discharge home. ESR is negative. At this point do feel that she is stable for discharge, she has a normal repeat neuro exam and is in no acute distress.  Patient was able to tolerate PO and was able to ambulate with a steady gait.   [PR]    Clinical Course User Index [PR] Merlyn Lot, MD     ____________________________________________   FINAL CLINICAL IMPRESSION(S) / ED DIAGNOSES  Final diagnoses:  Bad headache      NEW MEDICATIONS STARTED DURING THIS VISIT:  New Prescriptions   No medications on file     Note:  This document was prepared using Dragon voice recognition software and may include unintentional dictation errors.    Merlyn Lot, MD 12/30/16 [phone]

## 2016-12-30 NOTE — ED Triage Notes (Signed)
Pt c/o worst HA she has ever had this morning. States she took tylenol arthritis with not relief .Marland Kitchen Denies any photophobia, N/V/ or visual changes.. States she has a hx of migraines but has not had one in a long time.

## 2016-12-30 NOTE — ED Notes (Signed)
Pt triage put in, in error under medics log in.

## 2016-12-30 NOTE — Discharge Instructions (Signed)

## 2016-12-30 NOTE — ED Notes (Signed)
The patient was helped up to the restroom. Denies any weakness of dizziness out of her normal. Family member present in the room to help her.  No incidents.

## 2017-01-04 ENCOUNTER — Telehealth: Payer: Self-pay | Admitting: Cardiovascular Disease

## 2017-01-04 NOTE — Telephone Encounter (Signed)
Pt states her legs and ankles are still swollen. Please call regarding Lasix doseage.

## 2017-01-04 NOTE — Telephone Encounter (Signed)
S/w patient. She reports she's been taking the Lasix 40 mg twice a day since office visit on 12/22/16 with Dr Rockey Situ: Medication Instructions:   Take lasix 40 mg daily until weight back to 130 Watch fluid intake  If no improvement, Take lasix 40 mg twice a day (8 and and 2 pm)  GOAL weight <130 ------------------------------------------------------- Patient states the swelling in her legs is better. Ankles have improved a little but still swollen and pitting. Her weight today was 134lb. She's been trying to limit sweet tea. She's been urinating well. Wanted to know what Dr Rockey Situ wanted her to do concerning furosemide. Advised patient to continue daily weights and continue with furosemide BID at this time. Routing to Dr Rockey Situ for advice.

## 2017-01-05 NOTE — Telephone Encounter (Signed)
Spoke with patient and reviewed Dr. Donivan Scull recommendations. She verbalized understanding of all instructions and has no further questions at this time.

## 2017-01-05 NOTE — Telephone Encounter (Signed)
Left voicemail message to call back  

## 2017-01-05 NOTE — Telephone Encounter (Signed)
Would agree with original instructions Lasix twice a day until weight 130 pounds then down to once a day

## 2017-01-12 ENCOUNTER — Ambulatory Visit (INDEPENDENT_AMBULATORY_CARE_PROVIDER_SITE_OTHER): Payer: Medicare HMO

## 2017-01-12 ENCOUNTER — Ambulatory Visit: Payer: Self-pay

## 2017-01-12 DIAGNOSIS — E538 Deficiency of other specified B group vitamins: Secondary | ICD-10-CM

## 2017-01-12 MED ORDER — CYANOCOBALAMIN 1000 MCG/ML IJ SOLN
1000.0000 ug | Freq: Once | INTRAMUSCULAR | Status: AC
  Administered 2017-01-12: 1000 ug via INTRAMUSCULAR

## 2017-01-12 NOTE — Progress Notes (Addendum)
Patient came in for B12 injection.  Received in Right deltoid.  Patient tolerated well, no complaints.   Reviewed.  Dr Nicki Reaper

## 2017-01-14 DIAGNOSIS — N2581 Secondary hyperparathyroidism of renal origin: Secondary | ICD-10-CM | POA: Diagnosis not present

## 2017-01-14 DIAGNOSIS — E1122 Type 2 diabetes mellitus with diabetic chronic kidney disease: Secondary | ICD-10-CM | POA: Diagnosis not present

## 2017-01-14 DIAGNOSIS — N184 Chronic kidney disease, stage 4 (severe): Secondary | ICD-10-CM | POA: Diagnosis not present

## 2017-01-14 DIAGNOSIS — N183 Chronic kidney disease, stage 3 (moderate): Secondary | ICD-10-CM | POA: Diagnosis not present

## 2017-01-14 DIAGNOSIS — I129 Hypertensive chronic kidney disease with stage 1 through stage 4 chronic kidney disease, or unspecified chronic kidney disease: Secondary | ICD-10-CM | POA: Diagnosis not present

## 2017-01-14 DIAGNOSIS — E559 Vitamin D deficiency, unspecified: Secondary | ICD-10-CM | POA: Diagnosis not present

## 2017-01-14 DIAGNOSIS — E875 Hyperkalemia: Secondary | ICD-10-CM | POA: Diagnosis not present

## 2017-01-16 ENCOUNTER — Other Ambulatory Visit: Payer: Self-pay | Admitting: Internal Medicine

## 2017-01-22 ENCOUNTER — Encounter: Payer: Self-pay | Admitting: Internal Medicine

## 2017-01-22 ENCOUNTER — Ambulatory Visit (INDEPENDENT_AMBULATORY_CARE_PROVIDER_SITE_OTHER): Payer: Medicare HMO | Admitting: Internal Medicine

## 2017-01-22 VITALS — BP 118/74 | HR 73 | Temp 98.2°F | Resp 14 | Ht 65.0 in | Wt 140.0 lb

## 2017-01-22 DIAGNOSIS — D649 Anemia, unspecified: Secondary | ICD-10-CM

## 2017-01-22 DIAGNOSIS — I428 Other cardiomyopathies: Secondary | ICD-10-CM

## 2017-01-22 DIAGNOSIS — K219 Gastro-esophageal reflux disease without esophagitis: Secondary | ICD-10-CM

## 2017-01-22 DIAGNOSIS — I10 Essential (primary) hypertension: Secondary | ICD-10-CM | POA: Diagnosis not present

## 2017-01-22 DIAGNOSIS — I25118 Atherosclerotic heart disease of native coronary artery with other forms of angina pectoris: Secondary | ICD-10-CM

## 2017-01-22 DIAGNOSIS — I272 Pulmonary hypertension, unspecified: Secondary | ICD-10-CM

## 2017-01-22 DIAGNOSIS — Z720 Tobacco use: Secondary | ICD-10-CM | POA: Diagnosis not present

## 2017-01-22 DIAGNOSIS — I420 Dilated cardiomyopathy: Secondary | ICD-10-CM

## 2017-01-22 DIAGNOSIS — N183 Chronic kidney disease, stage 3 unspecified: Secondary | ICD-10-CM

## 2017-01-22 DIAGNOSIS — I739 Peripheral vascular disease, unspecified: Secondary | ICD-10-CM

## 2017-01-22 DIAGNOSIS — I5022 Chronic systolic (congestive) heart failure: Secondary | ICD-10-CM

## 2017-01-22 DIAGNOSIS — M542 Cervicalgia: Secondary | ICD-10-CM

## 2017-01-22 DIAGNOSIS — N184 Chronic kidney disease, stage 4 (severe): Secondary | ICD-10-CM | POA: Diagnosis not present

## 2017-01-22 DIAGNOSIS — E782 Mixed hyperlipidemia: Secondary | ICD-10-CM | POA: Diagnosis not present

## 2017-01-22 DIAGNOSIS — J432 Centrilobular emphysema: Secondary | ICD-10-CM

## 2017-01-22 DIAGNOSIS — K5900 Constipation, unspecified: Secondary | ICD-10-CM

## 2017-01-22 DIAGNOSIS — I779 Disorder of arteries and arterioles, unspecified: Secondary | ICD-10-CM

## 2017-01-22 LAB — HEPATIC FUNCTION PANEL
ALT: 26 U/L (ref 0–35)
AST: 21 U/L (ref 0–37)
Albumin: 3.7 g/dL (ref 3.5–5.2)
Alkaline Phosphatase: 101 U/L (ref 39–117)
BILIRUBIN DIRECT: 0.5 mg/dL — AB (ref 0.0–0.3)
TOTAL PROTEIN: 6 g/dL (ref 6.0–8.3)
Total Bilirubin: 1.3 mg/dL — ABNORMAL HIGH (ref 0.2–1.2)

## 2017-01-22 LAB — CBC WITH DIFFERENTIAL/PLATELET
BASOS PCT: 0.6 % (ref 0.0–3.0)
Basophils Absolute: 0 10*3/uL (ref 0.0–0.1)
EOS ABS: 0.1 10*3/uL (ref 0.0–0.7)
Eosinophils Relative: 1.4 % (ref 0.0–5.0)
HEMATOCRIT: 45.1 % (ref 36.0–46.0)
Hemoglobin: 14.8 g/dL (ref 12.0–15.0)
LYMPHS PCT: 13.2 % (ref 12.0–46.0)
Lymphs Abs: 0.7 10*3/uL (ref 0.7–4.0)
MCHC: 32.8 g/dL (ref 30.0–36.0)
MCV: 97.4 fl (ref 78.0–100.0)
MONO ABS: 0.5 10*3/uL (ref 0.1–1.0)
Monocytes Relative: 9.8 % (ref 3.0–12.0)
NEUTROS ABS: 4.1 10*3/uL (ref 1.4–7.7)
Neutrophils Relative %: 75 % (ref 43.0–77.0)
PLATELETS: 181 10*3/uL (ref 150.0–400.0)
RBC: 4.63 Mil/uL (ref 3.87–5.11)
RDW: 15.5 % (ref 11.5–15.5)
WBC: 5.4 10*3/uL (ref 4.0–10.5)

## 2017-01-22 LAB — FERRITIN: Ferritin: 44.8 ng/mL (ref 10.0–291.0)

## 2017-01-22 LAB — BASIC METABOLIC PANEL
BUN: 36 mg/dL — ABNORMAL HIGH (ref 6–23)
CHLORIDE: 101 meq/L (ref 96–112)
CO2: 32 mEq/L (ref 19–32)
Calcium: 9.1 mg/dL (ref 8.4–10.5)
Creatinine, Ser: 1.59 mg/dL — ABNORMAL HIGH (ref 0.40–1.20)
GFR: 34.05 mL/min — AB (ref >60.00)
Glucose, Bld: 101 mg/dL — ABNORMAL HIGH (ref 70–99)
POTASSIUM: 4.6 meq/L (ref 3.5–5.1)
SODIUM: 141 meq/L (ref 135–145)

## 2017-01-22 MED ORDER — MAGNESIUM OXIDE 400 MG PO TABS: 400.0000 mg | ORAL_TABLET | Freq: Every day | ORAL | 0 refills | Status: DC

## 2017-01-22 NOTE — Progress Notes (Signed)
Patient ID: Sandra Ellison, female   DOB: Jan 13, 1946, 71 y.o.   MRN: [id number]   Subjective:    Patient ID: Sandra Ellison, female    DOB: November 30, 1945, 71 y.o.   MRN: [id number]  HPI  Patient here for a scheduled follow up.  She is accompanied by her sister.  History obtained from both of them.  She reports she is having problems constipation.  Had a small bowel movement today.  Last good bowel movement she thinks was last week.  Taking dulcolax and stool softener.  Eating prunes.  Tried suppository.  Had small bowel movement today.  No vomiting.  She is eating.  No chest pain.  Breathing stable.  Has been having persistent neck pain.  Had c-spine xray - diffuse multilevel degenerative change with 24m anterolisthesis C3-4.  She has seen ortho.  Discussed [facility] referral.  She request referral.     Past Medical History:  Diagnosis Date  . Allergy   . Arthritis   . Carotid arterial disease ([place]    a. 02/2015 Carotid U/S: RICA 615-17% LICA 461-60%  . Chronic combined systolic and diastolic CHF (congestive heart failure) ([facility]    a. 01/2012 Echo: EF 30-35%, mod conc LVH, sev inf HK, mildly dil LA, mild-mod MR, Trace TR, RVSP 40-574mg.    . Marland Kitchenhronic kidney disease (CKD), stage II (mild)   . COPD (chronic obstructive pulmonary disease) ([facility]  . Dilated cardiomyopathy ([facility]   a. 02/2011 MV: EF 27%, anteroseptal, inf, inferolateral scar, no ischemia.  . Marland KitchenERD (gastroesophageal reflux disease)   . History of abnormal Pap smear    a. s/p vaginal hysterectomy  . History of diverticulosis   . Hx of colonic polyps   . Hyperkalemia    a. 02/2015 in setting of entresto/AKI->entresto d/c'd.  . Hypertension   . Iron deficiency anemia    a. h/o transfusions, prev followed by heme/onc.  . Pulmonary HTN ([facility]  . Stroke ([facility]  . TIA (transient ischemic attack)   . Tobacco abuse    Past Surgical History:  Procedure Laterality Date  . CARDIAC CATHETERIZATION Left 08/05/2015   Procedure: Left Heart  Cath and Coronary Angiography;  Surgeon: TiMinna MerrittsMD;  [facility];  Service: Cardiovascular;  Laterality: Left;  . COLONOSCOPY  10/2010  . COLONOSCOPY WITH PROPOFOL N/A 12/15/2016   Procedure: COLONOSCOPY WITH PROPOFOL;  Surgeon: ByRobert BellowMD;  Location: [facility] ENDOSCOPY;  Service: Endoscopy;  Laterality: N/A;  . ESOPHAGOGASTRODUODENOSCOPY (EGD) WITH PROPOFOL N/A 12/15/2016   Procedure: ESOPHAGOGASTRODUODENOSCOPY (EGD) WITH PROPOFOL;  Surgeon: ByRobert BellowMD;  Location: [facility] ENDOSCOPY;  Service: Endoscopy;  Laterality: N/A;  . ORIF DISTAL RADIUS FRACTURE  2011   right  . TONSILLECTOMY  age 71. VAGINAL HYSTERECTOMY  1981   secondary to abnormal pap smear   Family History  Problem Relation Age of Onset  . Arthritis Mother   . Alcohol abuse Father   . Arthritis Father   . Cancer Father        throat/spine  . Breast cancer Neg Hx    Social History   Social History  . Marital status: Divorced    Spouse name: N/A  . Number of children: 1  . Years of education: N/A   Social History Main Topics  . Smoking status: Current Every Day Smoker    Packs/day: 0.50    Years: 42.00    Types: Cigarettes  . Smokeless tobacco: Never  Used     Comment: Down to 3 cigarettes per day  . Alcohol use No  . Drug use: No  . Sexual activity: No   Other Topics Concern  . None   Social History Narrative  . None    Outpatient Encounter Prescriptions as of 01/22/2017  Medication Sig  . albuterol (PROVENTIL) (2.5 MG/3ML) 0.083% nebulizer solution USE ONE TREATMENT EVERY 6 HOURS AS NEEDED  . aspirin EC 81 MG tablet Take 81 mg by mouth daily.  Marland Kitchen atorvastatin (LIPITOR) 40 MG tablet TAKE 1 TABLET (40 MG TOTAL) BY MOUTH DAILY. (Patient taking differently: Take 20 mg by mouth daily. )  . carvedilol (COREG) 6.25 MG tablet TAKE 1 TABLET (6.25 MG TOTAL) BY MOUTH 2 (TWO) TIMES DAILY WITH A MEAL.  . citalopram (CELEXA) 10 MG tablet TAKE 1 TABLET (10 MG TOTAL) BY MOUTH  DAILY.  Marland Kitchen clopidogrel (PLAVIX) 75 MG tablet TAKE 1 TABLET (75 MG TOTAL) BY MOUTH DAILY.  Marland Kitchen Cyanocobalamin 1000 MCG/ML KIT Inject 1 mL as directed every 30 (thirty) days.  . Fe Fum-FePoly-Vit C-Vit B3 (INTEGRA) 62.5-62.5-40-3 MG CAPS One per day  . furosemide (LASIX) 20 MG tablet Take 2 tablets (40 mg total) by mouth 2 (two) times daily as needed.  . gabapentin (NEURONTIN) 100 MG capsule Take 100 mg by mouth 3 (three) times daily.  Marland Kitchen gabapentin (NEURONTIN) 300 MG capsule Take 1 capsule (300 mg total) by mouth 3 (three) times daily.  Marland Kitchen LORazepam (ATIVAN) 0.5 MG tablet Take 1 tablet (0.5 mg total) by mouth every 8 (eight) hours as needed for anxiety.  . ondansetron (ZOFRAN) 4 MG tablet Take 1 tablet (4 mg total) by mouth every 6 (six) hours as needed for nausea.  . pantoprazole (PROTONIX) 40 MG tablet Take 1 tablet (40 mg total) by mouth 2 (two) times daily.  Marland Kitchen SPIRIVA HANDIHALER 18 MCG inhalation capsule INHALE ONCE DAILY  . VENTOLIN HFA 108 (90 Base) MCG/ACT inhaler INHALE 2 PUFFS EVERY 6 HOURS AS NEEDED FOR WHEEZING OR SHORTNESS OF BREATH  . Vitamin D, Ergocalciferol, (DRISDOL) 50000 units CAPS capsule Take 50,000 Units by mouth every 7 (seven) days.  . magnesium oxide (MAG-OX) 400 MG tablet Take 1 tablet (400 mg total) by mouth daily.  . [DISCONTINUED] losartan (COZAAR) 25 MG tablet Take 25 mg by mouth daily.  . [DISCONTINUED] VOLTAREN 1 % GEL Apply 2 g topically 4 (four) times daily.   No facility-administered encounter medications on file as of 01/22/2017.     Review of Systems  Constitutional: Negative for fever and unexpected weight change.  HENT: Negative for congestion and sinus pressure.   Respiratory: Negative for cough, chest tightness and shortness of breath.   Cardiovascular: Negative for chest pain and palpitations.       Has had some lower extremity - pedal edema.  Improved.   Gastrointestinal: Positive for constipation. Negative for vomiting.       No significant abdominal  pain.   Genitourinary: Negative for difficulty urinating and dysuria.  Musculoskeletal: Positive for neck pain. Negative for joint swelling and myalgias.  Skin: Negative for color change and rash.  Neurological: Negative for dizziness.       Previous headache.  Better.    Psychiatric/Behavioral: Negative for agitation and dysphoric mood.       Objective:    Physical Exam  Constitutional: She appears well-developed and well-nourished. No distress.  HENT:  Nose: Nose normal.  Mouth/Throat: Oropharynx is clear and moist.  Neck: Neck supple. No thyromegaly  present.  Cardiovascular: Normal rate and regular rhythm.   Pulmonary/Chest: Breath sounds normal. No respiratory distress. She has no wheezes.  Abdominal: Soft. Bowel sounds are normal. There is no tenderness.  Musculoskeletal: She exhibits no tenderness.  Pedal and ankle edema.  (per her report improved).    Lymphadenopathy:    She has no cervical adenopathy.  Skin: No rash noted. No erythema.  Psychiatric: She has a normal mood and affect. Her behavior is normal.    BP 118/74 (BP Location: Left Arm, Patient Position: Sitting, Cuff Size: Normal)   Pulse 73   Temp 98.2 F (36.8 C) (Oral)   Resp 14   Ht 5' 5"  (1.651 m)   Wt 140 lb (63.5 kg)   SpO2 96%   BMI 23.30 kg/m  Wt Readings from Last 3 Encounters:  01/22/17 140 lb (63.5 kg)  12/30/16 136 lb (61.7 kg)  12/22/16 141 lb 12 oz (64.3 kg)     Lab Results  Component Value Date   WBC 5.4 01/22/2017   HGB 14.8 01/22/2017   HCT 45.1 01/22/2017   PLT 181.0 01/22/2017   GLUCOSE 101 (H) 01/22/2017   CHOL 91 03/26/2016   TRIG 105.0 03/26/2016   HDL 30.60 (L) 03/26/2016   LDLCALC 39 03/26/2016   ALT 26 01/22/2017   AST 21 01/22/2017   NA 141 01/22/2017   K 4.6 01/22/2017   CL 101 01/22/2017   CREATININE 1.59 (H) 01/22/2017   BUN 36 (H) 01/22/2017   CO2 32 01/22/2017   TSH 3.39 10/13/2016   INR 1.10 11/24/2015   HGBA1C 5.0 03/26/2016    Ct Angio Head W Or Wo  Contrast  Result Date: 12/30/2016 CLINICAL DATA:  Worst headache of life. EXAM: CT ANGIOGRAPHY HEAD TECHNIQUE: Multidetector CT imaging of the head was performed using the standard protocol during bolus administration of intravenous contrast. Multiplanar CT image reconstructions and MIPs were obtained to evaluate the vascular anatomy. CONTRAST:  60 mL Isovue 370 COMPARISON:  CT head without contrast 12/30/2016. MRI brain 11/25/2015. FINDINGS: CTA HEAD Anterior circulation: The internal carotid artery's demonstrate atherosclerotic calcifications in the cavernous segments bilaterally without a significant stenosis relative to the more distal vessel. The ICA termini are within normal limits bilaterally. The A1 and M1 segments are within normal limits. The anterior communicating artery is patent. The MCA bifurcations are intact. There is mild irregularity of MCA branch vessels bilaterally without a significant proximal stenosis or occlusion. No aneurysm is present. Posterior circulation: The right vertebral artery is dominant. The PICA origins are not visualized. Dominant AICA vessels are seen bilaterally. The basilar artery is small. Both posterior cerebral arteries are of fetal type. A small left P1 segment is present. Segmental irregularity is present in the distal PCA branch vessels bilaterally without a significant proximal stenosis or occlusion. Venous sinuses: The dural sinuses are patent. Straight sinus and deep cerebral veins are intact. The cortical veins are unremarkable. Anatomic variants: Bilateral fetal type posterior cerebral arteries. Delayed phase: The postcontrast images demonstrate no pathologic enhancement. IMPRESSION: 1. No aneurysm or hemorrhage. 2. No emerging large vessel occlusion. 3. Atherosclerotic changes of the cavernous internal carotid arteries bilaterally without a significant stenosis. Electronically Signed   By: San Morelle M.D.   On: 12/30/2016 15:32   Ct Head Wo  Contrast  Result Date: 12/30/2016 CLINICAL DATA:  Severe headache EXAM: CT HEAD WITHOUT CONTRAST TECHNIQUE: Contiguous axial images were obtained from the base of the skull through the vertex without intravenous contrast. COMPARISON:  11/24/2015 FINDINGS: Brain: No acute intracranial abnormality. Specifically, no hemorrhage, hydrocephalus, mass lesion, acute infarction, or significant intracranial injury. Vascular: No hyperdense vessel or unexpected calcification. Skull: No acute calvarial abnormality. Sinuses/Orbits: Visualized paranasal sinuses and mastoids clear. Orbital soft tissues unremarkable. Other: None IMPRESSION: Normal head CT for patient's age. Electronically Signed   By: Rolm Baptise M.D.   On: 12/30/2016 11:33       Assessment & Plan:   Problem List Items Addressed This Visit    Anemia - Primary    Has a history of iron deficient anemia and anemia of chronic renal disease.  Follow cbc.        Relevant Orders   CBC with Differential/Platelet (Completed)   Ferritin (Completed)   CAD (coronary artery disease)    Continue risk factor modification.  Followed by cardiology.        Carotid arterial disease ([place])    Followed by cardiology.       Chronic systolic CHF (congestive heart failure) ([place])    She is monitoring her weight.  Lower extremity swelling has improved.  Taking lasix bid.  Will hold on increasing.  Breathing stable.        CKD (chronic kidney disease), stage III   Relevant Orders   Hepatic function panel (Completed)   Basic metabolic panel (Completed)   CKD (chronic kidney disease), stage IV ([place])    Followed by nephrology.  Had hyperkalemia with ARB and ACE inhibitor.  Follow renal function.        Constipation    Persistent constipation.  Have her try enema as we discussed.  After bowel movement, start miralax and stool softener on a regular basis.        COPD (chronic obstructive pulmonary disease) ([facility])    Breathing stable.  No increased cough and  congestion.       Dilated cardiomyopathy ([place])    Followed by cardiology.  Stable.        Essential hypertension    Blood pressure under good control.  Continue same medication regimen.  Follow pressures.  Follow metabolic panel.        Gastroesophageal reflux disease    Controlled on protonix.        Relevant Medications   magnesium oxide (MAG-OX) 400 MG tablet   Hyperlipidemia    Low cholesterol diet and exercise.  Follow lipid panel.        Neck pain    Neck pain as outlined.  Has seen ortho.  c-spine xray as outlined.  Refer to [facility].        Relevant Orders   Ambulatory referral to Neurosurgery   Nonischemic cardiomyopathy [facility])    Followed by cardiology.  Stable.       Pulmonary hypertension ([place])    Followed by cardiology.       Tobacco abuse    Discussed with her today. Discussed the need to stop smoking.  She desires not to stop at this time.  Follow.          I spent 40 minutes with the patient and more than 50% of the time was spent in consultation regarding the above.  Time spent obtaining history and discussing her symptoms.  Time also spent discussing treatment options and further evaluation.     Einar Pheasant, MD

## 2017-01-22 NOTE — Progress Notes (Signed)
Pre-visit discussion using our clinic review tool. No additional management support is needed unless otherwise documented below in the visit note.  

## 2017-01-24 ENCOUNTER — Other Ambulatory Visit: Payer: Self-pay | Admitting: Internal Medicine

## 2017-01-24 ENCOUNTER — Encounter: Payer: Self-pay | Admitting: Internal Medicine

## 2017-01-24 DIAGNOSIS — M542 Cervicalgia: Secondary | ICD-10-CM | POA: Insufficient documentation

## 2017-01-24 DIAGNOSIS — N184 Chronic kidney disease, stage 4 (severe): Secondary | ICD-10-CM | POA: Insufficient documentation

## 2017-01-24 DIAGNOSIS — K5900 Constipation, unspecified: Secondary | ICD-10-CM | POA: Insufficient documentation

## 2017-01-24 NOTE — Assessment & Plan Note (Signed)
Continue risk factor modification.  Followed by cardiology.  

## 2017-01-24 NOTE — Assessment & Plan Note (Signed)
Discussed with her today. Discussed the need to stop smoking.  She desires not to stop at this time.  Follow.

## 2017-01-24 NOTE — Assessment & Plan Note (Signed)
Low cholesterol diet and exercise.  Follow lipid panel.   

## 2017-01-24 NOTE — Assessment & Plan Note (Signed)
Followed by cardiology 

## 2017-01-24 NOTE — Assessment & Plan Note (Signed)
She is monitoring her weight.  Lower extremity swelling has improved.  Taking lasix bid.  Will hold on increasing.  Breathing stable.

## 2017-01-24 NOTE — Assessment & Plan Note (Signed)
Breathing stable.  No increased cough and congestion.   

## 2017-01-24 NOTE — Assessment & Plan Note (Signed)
Blood pressure under good control.  Continue same medication regimen.  Follow pressures.  Follow metabolic panel.   

## 2017-01-24 NOTE — Assessment & Plan Note (Signed)
Followed by nephrology.  Had hyperkalemia with ARB and ACE inhibitor.  Follow renal function.

## 2017-01-24 NOTE — Assessment & Plan Note (Signed)
Followed by cardiology. Stable.   

## 2017-01-24 NOTE — Assessment & Plan Note (Signed)
Persistent constipation.  Have her try enema as we discussed.  After bowel movement, start miralax and stool softener on a regular basis.

## 2017-01-24 NOTE — Assessment & Plan Note (Signed)
Has a history of iron deficient anemia and anemia of chronic renal disease.  Follow cbc.   

## 2017-01-24 NOTE — Progress Notes (Signed)
Order placed for f/u liver panel.  

## 2017-01-24 NOTE — Assessment & Plan Note (Signed)
Controlled on protonix.   

## 2017-01-24 NOTE — Assessment & Plan Note (Signed)
Neck pain as outlined.  Has seen ortho.  c-spine xray as outlined.  Refer to [facility].

## 2017-01-26 ENCOUNTER — Telehealth: Payer: Self-pay | Admitting: Internal Medicine

## 2017-01-26 NOTE — Telephone Encounter (Signed)
See results note for documentation.  

## 2017-01-26 NOTE — Telephone Encounter (Signed)
Pt called back returning your call. Please advise, thank you!  Call pt @ [phone]

## 2017-01-27 ENCOUNTER — Other Ambulatory Visit: Payer: Self-pay | Admitting: Cardiovascular Disease

## 2017-01-27 ENCOUNTER — Other Ambulatory Visit: Payer: Self-pay | Admitting: Internal Medicine

## 2017-01-27 NOTE — Progress Notes (Signed)
Order placed for abdominal ultrasound.   

## 2017-02-02 ENCOUNTER — Ambulatory Visit
Admission: RE | Admit: 2017-02-02 | Discharge: 2017-02-02 | Disposition: A | Discharge status: Home / Self Care | Payer: Medicare HMO | Source: Ambulatory Visit | Attending: Internal Medicine | Admitting: Internal Medicine

## 2017-02-02 DIAGNOSIS — K824 Cholesterolosis of gallbladder: Secondary | ICD-10-CM | POA: Insufficient documentation

## 2017-02-02 DIAGNOSIS — R188 Other ascites: Secondary | ICD-10-CM | POA: Diagnosis not present

## 2017-02-02 DIAGNOSIS — N200 Calculus of kidney: Secondary | ICD-10-CM | POA: Diagnosis not present

## 2017-02-02 DIAGNOSIS — N261 Atrophy of kidney (terminal): Secondary | ICD-10-CM | POA: Insufficient documentation

## 2017-02-02 DIAGNOSIS — R17 Unspecified jaundice: Secondary | ICD-10-CM | POA: Diagnosis present

## 2017-02-03 ENCOUNTER — Other Ambulatory Visit: Payer: Self-pay | Admitting: Internal Medicine

## 2017-02-03 ENCOUNTER — Other Ambulatory Visit (INDEPENDENT_AMBULATORY_CARE_PROVIDER_SITE_OTHER): Payer: Medicare HMO

## 2017-02-03 ENCOUNTER — Telehealth: Payer: Self-pay | Admitting: *Deleted

## 2017-02-03 DIAGNOSIS — R0789 Other chest pain: Secondary | ICD-10-CM | POA: Diagnosis not present

## 2017-02-03 DIAGNOSIS — R05 Cough: Secondary | ICD-10-CM | POA: Diagnosis not present

## 2017-02-03 LAB — HEPATIC FUNCTION PANEL
ALT: 15 U/L (ref 0–35)
AST: 17 U/L (ref 0–37)
Albumin: 3.8 g/dL (ref 3.5–5.2)
Alkaline Phosphatase: 101 U/L (ref 39–117)
Bilirubin, Direct: 0.5 mg/dL — ABNORMAL HIGH (ref 0.0–0.3)
TOTAL PROTEIN: 6.1 g/dL (ref 6.0–8.3)
Total Bilirubin: 1.3 mg/dL — ABNORMAL HIGH (ref 0.2–1.2)

## 2017-02-03 NOTE — Telephone Encounter (Signed)
Per results note [name] has sent information to Dr. Nicki Reaper. Pending response from her.

## 2017-02-03 NOTE — Telephone Encounter (Signed)
Pt requested Imaging results Pt contact [phone]

## 2017-02-04 ENCOUNTER — Other Ambulatory Visit: Payer: Self-pay | Admitting: Internal Medicine

## 2017-02-04 DIAGNOSIS — R188 Other ascites: Secondary | ICD-10-CM

## 2017-02-04 NOTE — Progress Notes (Signed)
Order placed for GI referral.   

## 2017-02-05 ENCOUNTER — Other Ambulatory Visit: Payer: Medicare HMO

## 2017-02-09 DIAGNOSIS — M542 Cervicalgia: Secondary | ICD-10-CM | POA: Diagnosis not present

## 2017-02-10 ENCOUNTER — Other Ambulatory Visit: Payer: Self-pay | Admitting: Student

## 2017-02-10 DIAGNOSIS — M542 Cervicalgia: Secondary | ICD-10-CM

## 2017-02-12 ENCOUNTER — Other Ambulatory Visit (INDEPENDENT_AMBULATORY_CARE_PROVIDER_SITE_OTHER): Payer: Self-pay

## 2017-02-12 ENCOUNTER — Ambulatory Visit (INDEPENDENT_AMBULATORY_CARE_PROVIDER_SITE_OTHER): Payer: Medicare HMO | Admitting: Vascular Surgery

## 2017-02-12 ENCOUNTER — Encounter (INDEPENDENT_AMBULATORY_CARE_PROVIDER_SITE_OTHER): Payer: Self-pay | Admitting: Vascular Surgery

## 2017-02-12 ENCOUNTER — Encounter (INDEPENDENT_AMBULATORY_CARE_PROVIDER_SITE_OTHER): Payer: Self-pay

## 2017-02-12 VITALS — BP 104/71 | HR 66 | Resp 16 | Ht 65.0 in | Wt 135.0 lb

## 2017-02-12 DIAGNOSIS — I701 Atherosclerosis of renal artery: Secondary | ICD-10-CM | POA: Diagnosis not present

## 2017-02-12 DIAGNOSIS — N184 Chronic kidney disease, stage 4 (severe): Secondary | ICD-10-CM | POA: Diagnosis not present

## 2017-02-12 DIAGNOSIS — J432 Centrilobular emphysema: Secondary | ICD-10-CM | POA: Diagnosis not present

## 2017-02-12 DIAGNOSIS — I150 Renovascular hypertension: Secondary | ICD-10-CM

## 2017-02-12 NOTE — Assessment & Plan Note (Signed)
Blood pressure control is reasonable, but she is on 4 different antihypertensive medications.

## 2017-02-12 NOTE — Assessment & Plan Note (Signed)
Her last GFR that I see was 30. This is pretty significant chronic kidney disease and is likely secondary to ischemic nephropathy from a solitary kidney with a high-grade renal artery stenosis and a combination of her medical issues. We had a long discussion today that this would clearly be an indication for treatment of a high-grade stenosis with a solitary kidney. She voices her understanding and agrees to proceed.

## 2017-02-12 NOTE — Progress Notes (Signed)
Patient ID: Sandra Ellison, female   DOB: 07-08-1945, 71 y.o.   MRN: [id number]  Chief Complaint  Patient presents with  . New Patient (Initial Visit)    Renal artery stenosis    HPI Sandra Ellison is a 71 y.o. female.  I am asked to see the patient by Dr. Juleen China for evaluation of renal artery stenosis.  The patient reports Having had an ultrasound a few weeks ago to check her kidney arteries. She reports in general, her blood pressure control is reasonably good but she has had a few instances where it has spiked up. She does not know her most recent renal function but the last labs I have available or creatinine clearance of around 30. She has a solitary right kidney and the renal artery velocities are markedly elevated consistent with a high-grade stenosis in the right renal artery. She is referred for further evaluation and treatment. No fevers or chills. No other complaints today.   Past Medical History:  Diagnosis Date  . Allergy   . Arthritis   . Carotid arterial disease ([place])    a. 02/2015 Carotid U/S: RICA 86-38%, LICA 17-71%.  . Chronic combined systolic and diastolic CHF (congestive heart failure) ([place])    a. 01/2012 Echo: EF 30-35%, mod conc LVH, sev inf HK, mildly dil LA, mild-mod MR, Trace TR, RVSP 40-10mHg.    .Marland KitchenChronic kidney disease (CKD), stage II (mild)   . COPD (ch[place] obstructive pulmonary disease) ([place]   . Dilated cardiomyopathy [place]    a. 02/2011 MV: EF 27%, anteroseptal, inf, inferolateral scar, no ischemia.  .Marland KitchenGERD (gastroesophageal reflux disease)   . History of abnormal Pap smear    a. s/p vaginal hysterectomy  . History of diverticulosis   . Hx of colonic polyps   . Hyperkalemia    a. 02/2015 in setting of entresto/AKI->entresto d/c'd.  . Hypertension   . Iron deficiency anemia    a. h/o transfusions, prev followed by heme/onc.  . Pulmonary HTN ([place]   . Stroke ([place]   . TIA (transient ischemic attack)   . Tobacco abuse     Past Surgical  History:  Procedure Laterality Date  . CARDIAC CATHETERIZATION Left 08/05/2015   Procedure: Left Heart Cath and Coronary Angiography;  Surgeon: TMinna Merritts MD;  [facility];  Service: Cardiovascular;  Laterality: Left;  . COLONOSCOPY  10/2010  . COLONOSCOPY WITH PROPOFOL N/A 12/15/2016   Procedure: COLONOSCOPY WITH PROPOFOL;  Surgeon: BRobert Bellow MD;  Location: [facility] ENDOSCOPY;  Service: Endoscopy;  Laterality: N/A;  . ESOPHAGOGASTRODUODENOSCOPY (EGD) WITH PROPOFOL N/A 12/15/2016   Procedure: ESOPHAGOGASTRODUODENOSCOPY (EGD) WITH PROPOFOL;  Surgeon: BRobert Bellow MD;  Location: [facility] ENDOSCOPY;  Service: Endoscopy;  Laterality: N/A;  . ORIF DISTAL RADIUS FRACTURE  2011   right  . TONSILLECTOMY  age 71 . VAGINAL HYSTERECTOMY  1981   secondary to abnormal pap smear    Family History  Problem Relation Age of Onset  . Arthritis Mother   . Alcohol abuse Father   . Arthritis Father   . Cancer Father        throat/spine  . Breast cancer Neg Hx   No bleeding or clotting disorders  Social History Social History  Substance Use Topics  . Smoking status: Current Every Day Smoker    Packs/day: 0.50    Years: 42.00    Types: Cigarettes  . Smokeless tobacco: Never Used     Comment: Down  to 3 cigarettes per day  . Alcohol use No  No IVDU  Allergies  Allergen Reactions  . Ranitidine Anaphylaxis  . Ranitidine Hcl Anaphylaxis and Swelling  . Entresto [Sacubitril-Valsartan] Other (See Comments)    Potassium issues    Current Outpatient Prescriptions  Medication Sig Dispense Refill  . albuterol (PROVENTIL) (2.5 MG/3ML) 0.083% nebulizer solution USE ONE TREATMENT EVERY 6 HOURS AS NEEDED 180 mL 1  . aspirin EC 81 MG tablet Take 81 mg by mouth daily.    Marland Kitchen atorvastatin (LIPITOR) 40 MG tablet TAKE 1 TABLET (40 MG TOTAL) BY MOUTH DAILY. (Patient taking differently: Take 20 mg by mouth daily. ) 30 tablet 3  . carvedilol (COREG) 6.25 MG tablet TAKE 1 TABLET  (6.25 MG TOTAL) BY MOUTH 2 (TWO) TIMES DAILY WITH A MEAL. 60 tablet 0  . citalopram (CELEXA) 10 MG tablet TAKE 1 TABLET (10 MG TOTAL) BY MOUTH DAILY. 30 tablet 3  . clopidogrel (PLAVIX) 75 MG tablet TAKE 1 TABLET (75 MG TOTAL) BY MOUTH DAILY. 30 tablet 5  . Cyanocobalamin 1000 MCG/ML KIT Inject 1 mL as directed every 30 (thirty) days.    . diazepam (VALIUM) 5 MG tablet     . Fe Fum-FePoly-Vit C-Vit B3 (INTEGRA) 62.5-62.5-40-3 MG CAPS One per day 30 capsule 2  . furosemide (LASIX) 20 MG tablet Take 2 tablets (40 mg total) by mouth 2 (two) times daily as needed. 120 tablet 6  . gabapentin (NEURONTIN) 100 MG capsule Take 100 mg by mouth 3 (three) times daily.    Marland Kitchen gabapentin (NEURONTIN) 300 MG capsule Take 1 capsule (300 mg total) by mouth 3 (three) times daily. 270 capsule 1  . lisinopril (PRINIVIL,ZESTRIL) 5 MG tablet     . LORazepam (ATIVAN) 0.5 MG tablet Take 1 tablet (0.5 mg total) by mouth every 8 (eight) hours as needed for anxiety. 30 tablet 0  . losartan (COZAAR) 25 MG tablet     . magnesium oxide (MAG-OX) 400 MG tablet Take 1 tablet (400 mg total) by mouth daily. 30 tablet 0  . methocarbamol (ROBAXIN) 750 MG tablet 1 tablets up to 4 times/day if needed for muscle pain and spasm    . ondansetron (ZOFRAN) 4 MG tablet Take 1 tablet (4 mg total) by mouth every 6 (six) hours as needed for nausea. 20 tablet 0  . pantoprazole (PROTONIX) 40 MG tablet Take 1 tablet (40 mg total) by mouth 2 (two) times daily. 60 tablet 1  . SPIRIVA HANDIHALER 18 MCG inhalation capsule INHALE ONCE DAILY 30 capsule 5  . VENTOLIN HFA 108 (90 Base) MCG/ACT inhaler INHALE 2 PUFFS EVERY 6 HOURS AS NEEDED FOR WHEEZING OR SHORTNESS OF BREATH 18 Inhaler 3  . Vitamin D, Ergocalciferol, (DRISDOL) 50000 units CAPS capsule Take 50,000 Units by mouth every 7 (seven) days.     No current facility-administered medications for this visit.       REVIEW OF SYSTEMS (Negative unless checked)  Constitutional: [] Weight loss   [] Fever  [] Chills Cardiac: [] Chest pain   [] Chest pressure   [] Palpitations   [] Shortness of breath when laying flat   [x] Shortness of breath at rest   [x] Shortness of breath with exertion. Vascular:  [] Pain in legs with walking   [] Pain in legs at rest   [] Pain in legs when laying flat   [] Claudication   [] Pain in feet when walking  [] Pain in feet at rest  [] Pain in feet when laying flat   [] History of DVT   [] Phlebitis   []   Swelling in legs   [] Varicose veins   [] Non-healing ulcers Pulmonary:   [] Uses home oxygen   [x] Productive cough   [] Hemoptysis   [] Wheeze  [x] COPD   [] Asthma Neurologic:  [] Dizziness  [] Blackouts   [] Seizures   [] History of stroke   [] History of TIA  [] Aphasia   [] Temporary blindness   [] Dysphagia   [] Weakness or numbness in arms   [] Weakness or numbness in legs Musculoskeletal:  [x] Arthritis   [] Joint swelling   [] Joint pain   [] Low back pain Hematologic:  [] Easy bruising  [] Easy bleeding   [] Hypercoagulable state   [] Anemic  [] Hepatitis Gastrointestinal:  [] Blood in stool   [] Vomiting blood  [x] Gastroesophageal reflux/heartburn   [] Abdominal pain Genitourinary:  [] Chronic kidney disease   [] Difficult urination  [] Frequent urination  [] Burning with urination   [] Hematuria Skin:  [] Rashes   [] Ulcers   [] Wounds Psychological:  [] History of anxiety   []  History of major depression.    Physical Exam BP 104/71 (BP Location: Left Arm)   Pulse 66   Resp 16   Ht 5' 5"  (1.651 m)   Wt 61.2 kg (135 lb)   BMI 22.47 kg/m  Gen:  WD/WN, NAD Head: [place]/AT, No temporalis wasting.  Ear/Nose/Throat: Hearing grossly intact, nares w/o erythema or drainage, oropharynx w/o Erythema/Exudate Eyes: Conjunctiva clear, sclera non-icteric  Neck: trachea midline.  No JVD.  Pulmonary:  Good air movement, respirations not labored, no use of accessory muscles Cardiac: irregular Vascular:  Vessel Right Left  Radial Palpable Palpable                                   Gastrointestinal:  soft, non-tender/non-distended. No guarding/reflex.  Musculoskeletal: M/S 5/5 throughout.  Extremities without ischemic changes.  No deformity or atrophy. 1+ BLE edema. Scattered varicosities Neurologic: Sensation grossly intact in extremities.  Symmetrical.  Speech is fluent. Motor exam as listed above. Psychiatric: Judgment intact, Mood & affect appropriate for pt's clinical situation. Dermatologic: No rashes or ulcers noted.  No cellulitis or open wounds. Lymph : No Cervical, Axillary, or Inguinal lymphadenopathy.   Radiology US Abdomen Complete  Result Date: 02/02/2017 CLINICAL DATA:  Hyperbilirubinemia. EXAM: ABDOMEN ULTRASOUND COMPLETE COMPARISON:  KUB 11/24/2015.  Ultrasound abdomen 09/06/2015 . FINDINGS: Gallbladder: Minimal sludge in the gallbladder cannot be excluded. 3 mm polyp noted. Gallbladder wall is thickened at 3.7 mm. Cholecystitis cannot be excluded. Negative Murphy sign. Common bile duct: Diameter: 5.2 mm Liver: No focal lesion identified. Within normal limits in parenchymal echogenicity. Portal vein is patent on color Doppler imaging with normal direction of blood flow towards the liver. IVC: No abnormality visualized. Pancreas: Visualized portion unremarkable. Spleen: Size and appearance within normal limits. Right Kidney: Length: 11.6 cm. Echogenicity within normal limits. No mass or hydronephrosis visualized. 4 mm nonobstructing stone lower pole right kidney. Left Kidney: Length: 8.3 cm. Cortical thinning consistent atrophy. Echogenicity within normal limits. No mass or hydronephrosis visualized. Abdominal aorta: No aneurysm visualized. Other findings: Minimal ascites. IMPRESSION: 1. Minimal sludge in the gallbladder cannot be excluded. 3 mm gallbladder polyp noted. Gallbladder wall is thickened at 3.7 mm. Cholecystitis cannot be excluded. No biliary distention. 2.  4 mm nonobstructing right renal stone.  Left renal atrophy. 3.  Minimal ascites. Electronically Signed   By: Marcello Moores   Register   On: 02/02/2017 14:33    Labs Recent Results (from the past 2160 hour(s))  Surgical pathology     Status: None  Collection Time: 12/15/16  2:01 PM  Result Value Ref Range   SURGICAL PATHOLOGY      Surgical Pathology CASE: [id number] PATIENT: Eliezer Lofts Surgical Pathology Report     SPECIMEN SUBMITTED: A. Stomach, antrum; cbx B. Esophagus, 40 cms; cbx C. Esophagus nodule, 35 cms; cbx D. Colon polyp x3, cecum; cbx E. Colon polyp x3, hepatic flexure; cbx F. Colon polyp, splenic flexure; cbx and colon polyp, proximal descending; cbx G. Colon polyp, sigmoid; hot snare  CLINICAL HISTORY: None provided  PRE-OPERATIVE DIAGNOSIS: HX polyps, GERD  POST-OPERATIVE DIAGNOSIS: Esophagitis, gastritis, esophageal nodule, colon polyps     DIAGNOSIS: A.  STOMACH, ANTRUM; COLD BIOPSY: - ANTRAL AND OXYNTIC MUCOSA WITH MILD CHRONIC GASTRITIS. - NEGATIVE FOR H. PYLORI, DYSPLASIA AND MALIGNANCY.  B.  ESOPHAGUS, 40 CM; COLD BIOPSY: - NO PATHOLOGIC CHANGE.  C.  ESOPHAGEAL NODULE, 35 CM; COLD BIOPSY: - FOCAL CHANGES COMPATIBLE WITH GLYCOGENIC ACANTHOSIS. - NEGATIVE FOR DYSPLASIA AND MALIGNANCY.  D.  COLON POLYP 3, CECUM; COLD BIOPSY: - TUBULAR ADENOMAS ( 3). - NEGATIVE FOR HIGH-GRADE DYSPLASIA AND MALIGNANCY.  E.  COLON POLYP 3, HEPATIC FLEXURE; COLD BIOPSY: - TUBULAR ADENOMAS (3). - NEGATIVE FOR HIGH-GRADE DYSPLASIA AND MALIGNANCY.  F.  COLON POLYP, SPLENIC FLEXURE AND PROXIMAL DESCENDING; COLD BIOPSY: - TUBULAR ADENOMAS (2). - NEGATIVE FOR HIGH-GRADE DYSPLASIA AND MALIGNANCY.  G.  COLON POLYP, SIGMOID; HOT SNARE: - TUBULOVILLOUS ADENOMA. - NEGATIVE FOR HIGH-GRADE DYSPLASIA AND MALIGNANCY.   GROSS DESCRIPTION:  A. Labeled: gastric antrum C BX  Tissue fragment(s): 3  Size: 0.3 cm  Description: tan fragments  Entirely submitted in one cassette(s).   B. Labeled: esophagus at 40 cm C BX  Tissue fragment(s): 3  Size: 0.2-0.3  cm  Description: thin grey fragments  Entirely submitted in one cassette(s).   C. Labeled: esophageal nodule at 35 cm C BX  Tissue fragment(s): 1  Size: 0.4 cm  Description: gray fragment  Entirely submitted in 1 cassette(s).   D. Labeled: cecum polyp C BX 3  Tissu e fragment(s): 3  Size: 0.3-0.4 cm  Description: yellow to tan fragments  Entirely submitted in 1 cassette(s).   E. Labeled: hepatic flexure polyp C BX 3  Tissue fragment(s): 4  Size: 0.3-0.4 cm  Description: tan fragments  Entirely submitted in 1 cassette(s).   F. Labeled: splenic flexure polyp and proximal descending colon polyp C BX total 2  Tissue fragment(s): 2  Size: 0.3 cm  Description: tan fragments and a small amount of fecal material  Entirely submitted in 1 cassette(s).   G. Labeled: sigmoid colon polyp hot snare  Tissue fragment(s): 1  Size: 0.9 x 0.9 x 0.5 cm  Description: brown polypoid fragment, inked blue at the base and bisected  Entirely submitted in one cassette(s).     Final Diagnosis performed by Delorse Lek, MD.  Electronically signed 12/18/2016 12:29:05PM    The electronic signature indicates that the named Attending Pathologist has evaluated the specimen  Technical component performed at [facility], [address] [place], [place], [address] [address] Lab: [phone] Dir: Darrick Penna. Evette Doffing, MD  Professional component performed at [facility], [facility], [address], [place], [place] [address] Lab: [phone] Dir: Dellia Nims. Rubinas, MD    Blood gas, arterial     Status: Abnormal   Collection Time: 12/15/16  3:34 PM  Result Value Ref Range   FIO2 0.50    Delivery systems BILEVEL POSITIVE AIRWAY PRESSURE    Inspiratory PAP 12    Expiratory PAP 6  pH, Arterial 7.33 (L) 7.350 - 7.450   pCO2 arterial 41 32.0 - 48.0 mmHg   pO2, Arterial 83 83.0 - 108.0 mmHg   Bicarbonate 21.6 20.0 - 28.0 mmol/L   Acid-base deficit 4.1 (H) 0.0 - 2.0  mmol/L   O2 Saturation 95.3 %   Patient temperature 37.0    Collection site LEFT RADIAL    Sample type ARTERIAL DRAW    Allens test (pass/fail) PASS PASS  CBC with Differential/Platelet     Status: Abnormal   Collection Time: 12/15/16  5:14 PM  Result Value Ref Range   WBC 6.9 3.6 - 11.0 K/uL   RBC 4.30 3.80 - 5.20 MIL/uL   Hemoglobin 14.1 12.0 - 16.0 g/dL   HCT 41.5 35.0 - 47.0 %   MCV 96.6 80.0 - 100.0 fL   MCH 32.8 26.0 - 34.0 pg   MCHC 33.9 32.0 - 36.0 g/dL   RDW 14.2 11.5 - 14.5 %   Platelets 175 150 - 440 K/uL   Neutrophils Relative % 84 %   Neutro Abs 5.8 1.4 - 6.5 K/uL   Lymphocytes Relative 6 %   Lymphs Abs 0.4 (L) 1.0 - 3.6 K/uL   Monocytes Relative 10 %   Monocytes Absolute 0.7 0.2 - 0.9 K/uL   Eosinophils Relative 0 %   Eosinophils Absolute 0.0 0 - 0.7 K/uL   Basophils Relative 0 %   Basophils Absolute 0.0 0 - 0.1 K/uL  Comprehensive metabolic panel     Status: Abnormal   Collection Time: 12/15/16  5:14 PM  Result Value Ref Range   Sodium 136 135 - 145 mmol/L   Potassium 3.9 3.5 - 5.1 mmol/L   Chloride 102 101 - 111 mmol/L   CO2 23 22 - 32 mmol/L   Glucose, Bld 90 65 - 99 mg/dL   BUN 36 (H) 6 - 20 mg/dL   Creatinine, Ser 1.83 (H) 0.44 - 1.00 mg/dL   Calcium 9.0 8.9 - 10.3 mg/dL   Total Protein 6.2 (L) 6.5 - 8.1 g/dL   Albumin 3.7 3.5 - 5.0 g/dL   AST 26 15 - 41 U/L   ALT 25 14 - 54 U/L   Alkaline Phosphatase 101 38 - 126 U/L   Total Bilirubin 1.9 (H) 0.3 - 1.2 mg/dL   GFR calc non Af Amer 27 (L) >60 mL/min   GFR calc Af Amer 31 (L) >60 mL/min    Comment: (NOTE) The eGFR has been calculated using the CKD EPI equation. This calculation has not been validated in all clinical situations. eGFR's persistently <60 mL/min signify possible Chronic Kidney Disease.    Anion gap 11 5 - 15  Troponin I     Status: None   Collection Time: 12/15/16  5:14 PM  Result Value Ref Range   Troponin I <0.03 <0.03 ng/mL  Lactic acid, plasma     Status: None   Collection  Time: 12/15/16  5:14 PM  Result Value Ref Range   Lactic Acid, Venous 1.2 0.5 - 1.9 mmol/L  CULTURE, BLOOD (ROUTINE X 2) w Reflex to ID Panel     Status: None   Collection Time: 12/15/16  5:14 PM  Result Value Ref Range   Specimen Description BLOOD LEFT AC    Special Requests Blood Culture adequate volume    Culture NO GROWTH 5 DAYS    Report Status 12/20/2016 FINAL   Lactic acid, plasma     Status: None   Collection Time: 12/15/16  6:25 PM  Result Value Ref Range   Lactic Acid, Venous 1.3 0.5 - 1.9 mmol/L  CULTURE, BLOOD (ROUTINE X 2) w Reflex to ID Panel     Status: None   Collection Time: 12/15/16  6:30 PM  Result Value Ref Range   Specimen Description BLOOD BLOOD LEFT HAND    Special Requests      BOTTLES DRAWN AEROBIC AND ANAEROBIC Blood Culture adequate volume   Culture NO GROWTH 5 DAYS    Report Status 12/20/2016 FINAL   Troponin I     Status: None   Collection Time: 12/16/16 12:23 AM  Result Value Ref Range   Troponin I <0.03 <0.03 ng/mL  Basic metabolic panel     Status: Abnormal   Collection Time: 12/16/16 12:23 AM  Result Value Ref Range   Sodium 136 135 - 145 mmol/L   Potassium 4.3 3.5 - 5.1 mmol/L   Chloride 102 101 - 111 mmol/L   CO2 24 22 - 32 mmol/L   Glucose, Bld 143 (H) 65 - 99 mg/dL   BUN 38 (H) 6 - 20 mg/dL   Creatinine, Ser 2.01 (H) 0.44 - 1.00 mg/dL   Calcium 9.2 8.9 - 10.3 mg/dL   GFR calc non Af Amer 24 (L) >60 mL/min   GFR calc Af Amer 28 (L) >60 mL/min    Comment: (NOTE) The eGFR has been calculated using the CKD EPI equation. This calculation has not been validated in all clinical situations. eGFR's persistently <60 mL/min signify possible Chronic Kidney Disease.    Anion gap 10 5 - 15  CBC     Status: None   Collection Time: 12/16/16 12:23 AM  Result Value Ref Range   WBC 5.5 3.6 - 11.0 K/uL   RBC 4.45 3.80 - 5.20 MIL/uL   Hemoglobin 14.4 12.0 - 16.0 g/dL   HCT 43.5 35.0 - 47.0 %   MCV 97.8 80.0 - 100.0 fL   MCH 32.5 26.0 - 34.0 pg    MCHC 33.2 32.0 - 36.0 g/dL   RDW 14.0 11.5 - 14.5 %   Platelets 175 150 - 440 K/uL  Troponin I     Status: None   Collection Time: 12/16/16  8:23 AM  Result Value Ref Range   Troponin I <0.03 <0.03 ng/mL  CBC with Differential/Platelet     Status: Abnormal   Collection Time: 12/30/16  2:01 PM  Result Value Ref Range   WBC 7.6 3.6 - 11.0 K/uL   RBC 4.71 3.80 - 5.20 MIL/uL   Hemoglobin 15.0 12.0 - 16.0 g/dL   HCT 44.8 35.0 - 47.0 %   MCV 95.1 80.0 - 100.0 fL   MCH 31.9 26.0 - 34.0 pg   MCHC 33.5 32.0 - 36.0 g/dL   RDW 14.6 (H) 11.5 - 14.5 %   Platelets 181 150 - 440 K/uL   Neutrophils Relative % 81 %   Neutro Abs 6.2 1.4 - 6.5 K/uL   Lymphocytes Relative 7 %   Lymphs Abs 0.5 (L) 1.0 - 3.6 K/uL   Monocytes Relative 11 %   Monocytes Absolute 0.8 0.2 - 0.9 K/uL   Eosinophils Relative 1 %   Eosinophils Absolute 0.1 0 - 0.7 K/uL   Basophils Relative 0 %   Basophils Absolute 0.0 0 - 0.1 K/uL  Basic metabolic panel     Status: Abnormal   Collection Time: 12/30/16  2:01 PM  Result Value Ref Range   Sodium 140 135 - 145 mmol/L   Potassium 3.9 3.5 -  5.1 mmol/L   Chloride 97 (L) 101 - 111 mmol/L   CO2 30 22 - 32 mmol/L   Glucose, Bld 91 65 - 99 mg/dL   BUN 42 (H) 6 - 20 mg/dL   Creatinine, Ser 1.67 (H) 0.44 - 1.00 mg/dL   Calcium 8.7 (L) 8.9 - 10.3 mg/dL   GFR calc non Af Amer 30 (L) >60 mL/min   GFR calc Af Amer 35 (L) >60 mL/min    Comment: (NOTE) The eGFR has been calculated using the CKD EPI equation. This calculation has not been validated in all clinical situations. eGFR's persistently <60 mL/min signify possible Chronic Kidney Disease.    Anion gap 13 5 - 15  Sedimentation rate     Status: None   Collection Time: 12/30/16  2:01 PM  Result Value Ref Range   Sed Rate 3 0 - 30 mm/hr  CBC with Differential/Platelet     Status: None   Collection Time: 01/22/17 11:04 AM  Result Value Ref Range   WBC 5.4 4.0 - 10.5 K/uL   RBC 4.63 3.87 - 5.11 Mil/uL   Hemoglobin 14.8 12.0  - 15.0 g/dL   HCT 45.1 36.0 - 46.0 %   MCV 97.4 78.0 - 100.0 fl   MCHC 32.8 30.0 - 36.0 g/dL   RDW 15.5 11.5 - 15.5 %   Platelets 181.0 150.0 - 400.0 K/uL   Neutrophils Relative % 75.0 43.0 - 77.0 %   Lymphocytes Relative 13.2 12.0 - 46.0 %   Monocytes Relative 9.8 3.0 - 12.0 %   Eosinophils Relative 1.4 0.0 - 5.0 %   Basophils Relative 0.6 0.0 - 3.0 %   Neutro Abs 4.1 1.4 - 7.7 K/uL   Lymphs Abs 0.7 0.7 - 4.0 K/uL   Monocytes Absolute 0.5 0.1 - 1.0 K/uL   Eosinophils Absolute 0.1 0.0 - 0.7 K/uL   Basophils Absolute 0.0 0.0 - 0.1 K/uL  Hepatic function panel     Status: Abnormal   Collection Time: 01/22/17 11:04 AM  Result Value Ref Range   Total Bilirubin 1.3 (H) 0.2 - 1.2 mg/dL   Bilirubin, Direct 0.5 (H) 0.0 - 0.3 mg/dL   Alkaline Phosphatase 101 39 - 117 U/L   AST 21 0 - 37 U/L   ALT 26 0 - 35 U/L   Total Protein 6.0 6.0 - 8.3 g/dL   Albumin 3.7 3.5 - 5.2 g/dL  Basic metabolic panel     Status: Abnormal   Collection Time: 01/22/17 11:04 AM  Result Value Ref Range   Sodium 141 135 - 145 mEq/L   Potassium 4.6 3.5 - 5.1 mEq/L   Chloride 101 96 - 112 mEq/L   CO2 32 19 - 32 mEq/L   Glucose, Bld 101 (H) 70 - 99 mg/dL   BUN 36 (H) 6 - 23 mg/dL   Creatinine, Ser 1.59 (H) 0.40 - 1.20 mg/dL   Calcium 9.1 8.4 - 10.5 mg/dL   GFR 34.05 (L) >60.00 mL/min  Ferritin     Status: None   Collection Time: 01/22/17 11:04 AM  Result Value Ref Range   Ferritin 44.8 10.0 - 291.0 ng/mL  Hepatic function panel     Status: Abnormal   Collection Time: 02/03/17  1:54 PM  Result Value Ref Range   Total Bilirubin 1.3 (H) 0.2 - 1.2 mg/dL   Bilirubin, Direct 0.5 (H) 0.0 - 0.3 mg/dL   Alkaline Phosphatase 101 39 - 117 U/L   AST 17 0 - 37 U/L  ALT 15 0 - 35 U/L   Total Protein 6.1 6.0 - 8.3 g/dL   Albumin 3.8 3.5 - 5.2 g/dL    Assessment/Plan:  COPD (chronic obstructive pulmonary disease) We'll need to monitor closely with her procedure as she has had hypoxia issues in the past. Continue  her steroid inhalers and bronchodilators.  CKD (chronic kidney disease), stage IV (HCC) Her last GFR that I see was 30. This is pretty significant chronic kidney disease and is likely secondary to ischemic nephropathy from a solitary kidney with a high-grade renal artery stenosis and a combination of her medical issues. We had a long discussion today that this would clearly be an indication for treatment of a high-grade stenosis with a solitary kidney. She voices her understanding and agrees to proceed.  Renovascular hypertension Blood pressure control is reasonable, but she is on 4 different antihypertensive medications.  Renal artery stenosis (HCC) Duplex shows an atrophic left kidney with a left renal artery occlusion. Her velocities are markedly elevated solitary right renal artery consistent with a high-grade stenosis. Her last GFR that I see was 30. This is pretty significant chronic kidney disease and is likely secondary to ischemic nephropathy from a solitary kidney with a high-grade renal artery stenosis and a combination of her medical issues. We had a long discussion today that this would clearly be an indication for treatment of a high-grade stenosis with a solitary kidney. She voices her understanding and agrees to proceed.      Leotis Pain 02/12/2017, 9:17 AM   This note was created with Dragon medical transcription system.  Any errors from dictation are unintentional.

## 2017-02-12 NOTE — Patient Instructions (Signed)
Renal Artery Stenosis Renal artery stenosis (RAS) is narrowing of the artery that carries blood to your kidneys. It can affect one or both kidneys. Your kidneys filter waste and extra fluid from your blood. You get rid of the waste and fluid when you urinate. Your kidneys also make an important chemical messenger (hormone) called renin. Renin helps regulate your blood pressure. The first sign of RAS may be high blood pressure. Over time, other symptoms can develop. What are the causes? Plaque buildup in your arteries (atherosclerosis) is the main cause of RAS. The plaques that cause this are made up of:  Fat.  Cholesterol.  Calcium.  Other substances.  As these substances build up in your renal artery, this slows the blood supply to your kidneys. The lack of blood and oxygen causes the signs and symptoms of RAS. A much less common cause of RAS is a disease called fibromuscular dysplasia. This disease causes abnormal cell growth that narrows the renal artery. It is not related to atherosclerosis. It occurs mostly in women who are 25-50 years old. It may be passed down through families. What increases the risk? You may be at risk for renal artery stenosis if you:  Are a man who is at least 71 years old.  Are a woman who is at least 71 years old.  Have high blood pressure.  Have high cholesterol.  Are a smoker.  Abuse alcohol.  Have diabetes or prediabetes.  Are overweight.  Have a family history of early heart disease.  What are the signs or symptoms? RAS usually develops slowly. You may not have any signs or symptoms at first. The earliest signs may be:  Developing high blood pressure.  A sudden increase in existing high blood pressure.  No longer responding to medicine that used to control your blood pressure.  Later signs and symptoms are due to kidney damage. They may include:  Fatigue.  Shortness of breath.  Swollen legs and feet.  Dry  skin.  Headaches.  Muscle cramps.  Loss of appetite.  Nausea or vomiting.  How is this diagnosed? Your health care provider may suspect RAS based on changes in your blood pressure and your risk factors. A physical exam will be done. Your health care provider may use a stethoscope to listen for a whooshing sound (bruit) that can occur where the renal artery is blocking blood flow. Several tests may be done to confirm a diagnosis of RAS. These may include:  Blood and urine tests to check your kidney function.  Imaging tests of your kidneys, such as: ? A test that involves using sound waves to create an image of your kidneys and the blood flow to your kidneys (ultrasound). ? A test in which dye is injected into one of your blood vessels so images can be taken as the dye flows through your renal arteries (angiogram). These tests can be done using X-rays, a CT scan (computed tomography angiogram, CTA), or a type of MRI (magnetic resonance angiogram, MRA).  How is this treated? Making lifestyle changes to reduce your risk factors is the first treatment option for early RAS. If the blood flow to one of your kidneys is cut by more than half, you may need medicine to:  Lower your blood pressure. This is the main medical treatment for RAS. You may need more than one type of medicine for this. The two types that work best for RAS are: ? ACE inhibitors. ? Angiotensin receptor blockers.  Reduce fluid   in the body (diuretics).  Lower your cholesterol (statins).  If medicine is not enough to control RAS, you may need surgery. This may involve:  Threading a tube with an inflatable balloon into the renal artery to force it open (angioplasty).  Removing plaque from inside the artery (endarterectomy).  Follow these instructions at home:  Take medicines only as directed by your health care provider.  Make any lifestyle changes recommended by your health care provider. This may include: ? Working  with a dietitian to maintain a heart-healthy diet. This type of diet is low in saturated fat, salt, and added sugar. ? Starting an exercise program as directed by your health care provider. ? Maintaining a healthy weight. ? Quitting smoking. ? Not abusing alcohol.  Keep all follow-up visits as directed by your health care provider. This is important. Contact a health care provider if:  Your symptoms of RAS are not getting better.  Your symptoms are changing or getting worse. Get help right away if:  You have very bad pain in your back or abdomen.  You have blood in your urine. This information is not intended to replace advice given to you by your health care provider. Make sure you discuss any questions you have with your health care provider. Document Released: 02/18/2005 Document Revised: 10/31/2015 Document Reviewed: 09/07/2013 Elsevier Interactive Patient Education  2018 [employer]  

## 2017-02-12 NOTE — Assessment & Plan Note (Signed)
We'll need to monitor closely with her procedure as she has had hypoxia issues in the past. Continue her steroid inhalers and bronchodilators.

## 2017-02-12 NOTE — Assessment & Plan Note (Signed)
Duplex shows an atrophic left kidney with a left renal artery occlusion. Her velocities are markedly elevated solitary right renal artery consistent with a high-grade stenosis. Her last GFR that I see was 30. This is pretty significant chronic kidney disease and is likely secondary to ischemic nephropathy from a solitary kidney with a high-grade renal artery stenosis and a combination of her medical issues. We had a long discussion today that this would clearly be an indication for treatment of a high-grade stenosis with a solitary kidney. She voices her understanding and agrees to proceed.

## 2017-02-13 ENCOUNTER — Other Ambulatory Visit: Payer: Self-pay | Admitting: Nurse Practitioner

## 2017-02-13 ENCOUNTER — Other Ambulatory Visit: Payer: Self-pay | Admitting: Internal Medicine

## 2017-02-16 ENCOUNTER — Ambulatory Visit: Payer: Medicare HMO

## 2017-02-16 ENCOUNTER — Ambulatory Visit (INDEPENDENT_AMBULATORY_CARE_PROVIDER_SITE_OTHER): Payer: Medicare HMO | Admitting: *Deleted

## 2017-02-16 DIAGNOSIS — E538 Deficiency of other specified B group vitamins: Secondary | ICD-10-CM | POA: Diagnosis not present

## 2017-02-16 MED ORDER — CEFAZOLIN SODIUM-DEXTROSE 1-4 GM/50ML-% IV SOLN: 1.0000 g | Freq: Once | INTRAVENOUS | Status: DC

## 2017-02-16 MED ORDER — CYANOCOBALAMIN 1000 MCG/ML IJ SOLN
1000.0000 ug | Freq: Once | INTRAMUSCULAR | Status: AC
  Administered 2017-02-16: 1000 ug via INTRAMUSCULAR

## 2017-02-16 NOTE — Progress Notes (Addendum)
Patient presented for B 12 injection to left deltoid, patient voiced no concerns nor showed any signs of distress during injection.  Reviewed.  Dr Scott 

## 2017-02-17 ENCOUNTER — Other Ambulatory Visit (INDEPENDENT_AMBULATORY_CARE_PROVIDER_SITE_OTHER): Payer: Self-pay | Admitting: Vascular Surgery

## 2017-02-17 ENCOUNTER — Encounter
Admission: RE | Disposition: A | Discharge status: Home / Self Care | Payer: Self-pay | Source: Ambulatory Visit | Attending: Vascular Surgery

## 2017-02-17 ENCOUNTER — Ambulatory Visit
Admission: RE | Admit: 2017-02-17 | Discharge: 2017-02-17 | Disposition: A | Discharge status: Home / Self Care | Payer: Medicare HMO | Source: Ambulatory Visit | Attending: Vascular Surgery | Admitting: Vascular Surgery

## 2017-02-17 ENCOUNTER — Encounter: Payer: Self-pay | Admitting: *Deleted

## 2017-02-17 DIAGNOSIS — I5042 Chronic combined systolic (congestive) and diastolic (congestive) heart failure: Secondary | ICD-10-CM | POA: Insufficient documentation

## 2017-02-17 DIAGNOSIS — Z888 Allergy status to other drugs, medicaments and biological substances status: Secondary | ICD-10-CM | POA: Insufficient documentation

## 2017-02-17 DIAGNOSIS — F17210 Nicotine dependence, cigarettes, uncomplicated: Secondary | ICD-10-CM | POA: Insufficient documentation

## 2017-02-17 DIAGNOSIS — Z90710 Acquired absence of both cervix and uterus: Secondary | ICD-10-CM | POA: Insufficient documentation

## 2017-02-17 DIAGNOSIS — Z7982 Long term (current) use of aspirin: Secondary | ICD-10-CM | POA: Insufficient documentation

## 2017-02-17 DIAGNOSIS — Z811 Family history of alcohol abuse and dependence: Secondary | ICD-10-CM | POA: Diagnosis not present

## 2017-02-17 DIAGNOSIS — N184 Chronic kidney disease, stage 4 (severe): Secondary | ICD-10-CM | POA: Insufficient documentation

## 2017-02-17 DIAGNOSIS — Z8673 Personal history of transient ischemic attack (TIA), and cerebral infarction without residual deficits: Secondary | ICD-10-CM | POA: Insufficient documentation

## 2017-02-17 DIAGNOSIS — J449 Chronic obstructive pulmonary disease, unspecified: Secondary | ICD-10-CM | POA: Diagnosis not present

## 2017-02-17 DIAGNOSIS — I130 Hypertensive heart and chronic kidney disease with heart failure and stage 1 through stage 4 chronic kidney disease, or unspecified chronic kidney disease: Secondary | ICD-10-CM | POA: Diagnosis not present

## 2017-02-17 DIAGNOSIS — I150 Renovascular hypertension: Secondary | ICD-10-CM | POA: Insufficient documentation

## 2017-02-17 DIAGNOSIS — Z8261 Family history of arthritis: Secondary | ICD-10-CM | POA: Insufficient documentation

## 2017-02-17 DIAGNOSIS — Z86010 Personal history of colonic polyps: Secondary | ICD-10-CM | POA: Diagnosis not present

## 2017-02-17 DIAGNOSIS — I272 Pulmonary hypertension, unspecified: Secondary | ICD-10-CM | POA: Insufficient documentation

## 2017-02-17 DIAGNOSIS — Z809 Family history of malignant neoplasm, unspecified: Secondary | ICD-10-CM | POA: Diagnosis not present

## 2017-02-17 DIAGNOSIS — M1990 Unspecified osteoarthritis, unspecified site: Secondary | ICD-10-CM | POA: Diagnosis not present

## 2017-02-17 DIAGNOSIS — K219 Gastro-esophageal reflux disease without esophagitis: Secondary | ICD-10-CM | POA: Insufficient documentation

## 2017-02-17 DIAGNOSIS — Z7902 Long term (current) use of antithrombotics/antiplatelets: Secondary | ICD-10-CM | POA: Diagnosis not present

## 2017-02-17 DIAGNOSIS — N059 Unspecified nephritic syndrome with unspecified morphologic changes: Secondary | ICD-10-CM | POA: Diagnosis not present

## 2017-02-17 DIAGNOSIS — I701 Atherosclerosis of renal artery: Secondary | ICD-10-CM | POA: Insufficient documentation

## 2017-02-17 DIAGNOSIS — Z9889 Other specified postprocedural states: Secondary | ICD-10-CM | POA: Insufficient documentation

## 2017-02-17 DIAGNOSIS — I6523 Occlusion and stenosis of bilateral carotid arteries: Secondary | ICD-10-CM | POA: Diagnosis not present

## 2017-02-17 HISTORY — PX: RENAL ANGIOGRAPHY: CATH118260

## 2017-02-17 HISTORY — DX: Dyspnea, unspecified: R06.00

## 2017-02-17 LAB — CREATININE, SERUM
Creatinine, Ser: 1.61 mg/dL — ABNORMAL HIGH (ref 0.44–1.00)
GFR calc Af Amer: 36 mL/min — ABNORMAL LOW (ref >60)
GFR, EST NON AFRICAN AMERICAN: 31 mL/min — AB (ref >60)

## 2017-02-17 LAB — POTASSIUM: Potassium: 3.7 mmol/L (ref 3.5–5.1)

## 2017-02-17 LAB — BUN: BUN: 35 mg/dL — ABNORMAL HIGH (ref 6–20)

## 2017-02-17 SURGERY — RENAL ANGIOGRAPHY
Anesthesia: Moderate Sedation | Laterality: Right

## 2017-02-17 MED ORDER — MIDAZOLAM HCL 2 MG/2ML IJ SOLN
INTRAMUSCULAR | Status: AC
  Filled 2017-02-17: qty 4

## 2017-02-17 MED ORDER — FENTANYL CITRATE (PF) 100 MCG/2ML IJ SOLN
INTRAMUSCULAR | Status: AC
  Filled 2017-02-17: qty 2

## 2017-02-17 MED ORDER — FENTANYL CITRATE (PF) 100 MCG/2ML IJ SOLN
INTRAMUSCULAR | Status: DC | PRN
  Administered 2017-02-17: 50 ug via INTRAVENOUS

## 2017-02-17 MED ORDER — HYDROMORPHONE HCL 1 MG/ML IJ SOLN: 1.0000 mg | Freq: Once | INTRAMUSCULAR | Status: DC | PRN

## 2017-02-17 MED ORDER — SODIUM CHLORIDE 0.9% FLUSH: 3.0000 mL | Freq: Two times a day (BID) | INTRAVENOUS | Status: DC

## 2017-02-17 MED ORDER — IOPAMIDOL (ISOVUE-300) INJECTION 61%
INTRAVENOUS | Status: DC | PRN
  Administered 2017-02-17: 50 mL via INTRA_ARTERIAL

## 2017-02-17 MED ORDER — MANNITOL 25 % IV SOLN
INTRAVENOUS | Status: DC | PRN
  Administered 2017-02-17: 12.5 g via INTRAVENOUS

## 2017-02-17 MED ORDER — ONDANSETRON HCL 4 MG/2ML IJ SOLN: 4.0000 mg | Freq: Four times a day (QID) | INTRAMUSCULAR | Status: DC | PRN

## 2017-02-17 MED ORDER — LIDOCAINE-EPINEPHRINE (PF) 2 %-1:200000 IJ SOLN
INTRAMUSCULAR | Status: AC
  Filled 2017-02-17: qty 20

## 2017-02-17 MED ORDER — SODIUM CHLORIDE FLUSH 0.9 % IV SOLN
INTRAVENOUS | Status: AC
  Filled 2017-02-17: qty 10

## 2017-02-17 MED ORDER — SODIUM CHLORIDE 0.9 % IV SOLN
INTRAVENOUS | Status: DC
  Administered 2017-02-17: 11:00:00 via INTRAVENOUS

## 2017-02-17 MED ORDER — LABETALOL HCL 5 MG/ML IV SOLN: 10.0000 mg | INTRAVENOUS | Status: DC | PRN

## 2017-02-17 MED ORDER — HEPARIN (PORCINE) IN NACL 2-0.9 UNIT/ML-% IJ SOLN
INTRAMUSCULAR | Status: AC
  Filled 2017-02-17: qty 1000

## 2017-02-17 MED ORDER — HYDRALAZINE HCL 20 MG/ML IJ SOLN: 5.0000 mg | INTRAMUSCULAR | Status: DC | PRN

## 2017-02-17 MED ORDER — SODIUM CHLORIDE 0.9% FLUSH: 3.0000 mL | INTRAVENOUS | Status: DC | PRN

## 2017-02-17 MED ORDER — SODIUM CHLORIDE 0.9 % IV SOLN: INTRAVENOUS | Status: DC

## 2017-02-17 MED ORDER — MANNITOL 25 % IV SOLN
INTRAVENOUS | Status: AC
  Filled 2017-02-17: qty 50

## 2017-02-17 MED ORDER — MIDAZOLAM HCL 2 MG/2ML IJ SOLN
INTRAMUSCULAR | Status: DC | PRN
  Administered 2017-02-17: 2 mg via INTRAVENOUS

## 2017-02-17 MED ORDER — CEFAZOLIN SODIUM-DEXTROSE 1-4 GM/50ML-% IV SOLN
INTRAVENOUS | Status: AC
  Administered 2017-02-17: 1 g
  Filled 2017-02-17: qty 50

## 2017-02-17 MED ORDER — SODIUM CHLORIDE 0.9 % IV SOLN: 250.0000 mL | INTRAVENOUS | Status: DC | PRN

## 2017-02-17 MED ORDER — SODIUM CHLORIDE 0.9 % IV SOLN
Freq: Once | INTRAVENOUS | Status: AC
  Administered 2017-02-17: 11:00:00 via INTRAVENOUS

## 2017-02-17 MED ORDER — HEPARIN SODIUM (PORCINE) 1000 UNIT/ML IJ SOLN
INTRAMUSCULAR | Status: DC | PRN
  Administered 2017-02-17: 4000 [IU] via INTRAVENOUS

## 2017-02-17 MED ORDER — HEPARIN SODIUM (PORCINE) 1000 UNIT/ML IJ SOLN
INTRAMUSCULAR | Status: AC
  Filled 2017-02-17: qty 1

## 2017-02-17 MED ORDER — METHYLPREDNISOLONE SODIUM SUCC 125 MG IJ SOLR: 125.0000 mg | INTRAMUSCULAR | Status: DC | PRN

## 2017-02-17 MED ORDER — CEFAZOLIN SODIUM-DEXTROSE 1-4 GM/50ML-% IV SOLN: 1.0000 g | Freq: Once | INTRAVENOUS | Status: DC

## 2017-02-17 SURGICAL SUPPLY — 16 items
CATH C2 65CM (CATHETERS) ×3 IMPLANT
CATH CXI SUPP ST 4FR 90CM (MICROCATHETER) ×3 IMPLANT
CATH PIG 70CM (CATHETERS) ×3 IMPLANT
DEVICE PRESTO INFLATION (MISCELLANEOUS) ×3 IMPLANT
DEVICE STARCLOSE SE CLOSURE (Vascular Products) ×3 IMPLANT
GLIDECATH 4FR STR (CATHETERS) ×3 IMPLANT
GLIDEWIRE STIFF .35X180X3 HYDR (WIRE) ×3 IMPLANT
NEEDLE ENTRY 21GA 7CM ECHOTIP (NEEDLE) ×3 IMPLANT
PACK ANGIOGRAPHY (CUSTOM PROCEDURE TRAY) ×3 IMPLANT
SET INTRO CAPELLA COAXIAL (SET/KITS/TRAYS/PACK) ×3 IMPLANT
SHEATH ANL2 6FRX45 HC (SHEATH) ×3 IMPLANT
SHEATH BRITE TIP 5FRX11 (SHEATH) ×3 IMPLANT
STENT LIFESTREAM 6X26X80 (Permanent Stent) ×6 IMPLANT
TUBING CONTRAST HIGH PRESS 72 (TUBING) ×3 IMPLANT
WIRE J 3MM .035X145CM (WIRE) ×3 IMPLANT
WIRE MAGIC TORQUE 260C (WIRE) ×3 IMPLANT

## 2017-02-17 NOTE — Progress Notes (Signed)
Patient up to [facility] prior to being D/C'd to home. Began to bleed at groin site. Patient placed back in bed, old dressing removed and pressure applied to site. Bleeding stopped. Area dressed with 2x2 and tegaderm and PAD placed. Will continue to monitor patient for one hour and will D/C to home if applicable.

## 2017-02-17 NOTE — H&P (Signed)
[facility] History & Physical Update  The patient was interviewed and re-examined.  The patient's previous History and Physical has been reviewed and is unchanged.  There is no change in the plan of care. We plan to proceed with the scheduled procedure.  Leotis Pain, MD  02/17/2017, 10:25 AM

## 2017-02-17 NOTE — Progress Notes (Signed)
Patient's PAD removed and no new bleeding noted. Patient discharged to home.

## 2017-02-17 NOTE — Op Note (Signed)
[facility] Percutaneous Study/Intervention Procedural Note    Surgeon(s): [employer]  Assistants: None  Pre-operative Diagnosis: Left renal artery occlusion, right renal artery stenosis, renovascular hypertension, ischemic nephropathy  Post-operative diagnosis: Same  Procedure(s) Performed: 1. Ultrasound guidance for vascular access right femoral artery 2. Catheter placement into right renal artery from right femoral approach 3. Aortogram and selective right renal angiogram 4. Balloon expandable stent placement to the right renal artery with 2 6 mm diameter x 26 mm length Lifestream stents 5. StarClose closure device right femoral artery  Contrast: 50 cc  EBL: minimal   Fluoro Time: 4 minutes  Moderate conscious sedation: Approximately 30 minutes with 2 mg of Versed and 50 mcg of Fentanyl  Indications: The patient is a 71 yo female with worsening severe hypertension despite multiple medications and declining renal function. The patient has suboptimal blood pressure control despite multiple antihypertensives and a noninvasive study demonstrating hemodynamically significant right renal artery stenosis and a left renal occlusion. Given the clinical scenario and the noninvasive findings, angiogram is indicated for further evaluation of her renal artery and potential treatment. Risks and benefits are discussed and informed consent is obtained.  Procedure: The patient was identified and appropriate procedural time out was performed. The patient was then placed supine on the table and prepped and draped in the usual sterile fashion.Moderate conscious sedation was administered with a face to face encounter with the patient throughout the procedure with my supervision of the RN administering medicines and monitoring the patients vital signs and mental status throughout from the start of  the procedure until the patient was taken to the recovery room  Ultrasound was used to evaluate the right common femoral artery. It was patent . A digital ultrasound image was acquired. A Seldinger needle was used to access the right common femoral artery under direct ultrasound guidance and a permanent image was performed. A 0.035 J wire was advanced without resistance and a 5Fr sheath was placed. Pigtail catheter was placed into the aorta at the L1 level and an AP aortogram was performed. This demonstrated calcific and irregular aorta and iliac segments. The right common iliac artery had what appeared to be a moderate stenosis in the 50% range but no other hemodynamically significant stenosis was seen in the aortoiliac segments. The left renal artery was clearly occluded with no nephrogram. The right renal artery appeared to have a high grade stenosis with marked post-stenotic dilatation. The patient was then systemically heparinized with 4000 units of intravenous heparin and 12-1/2 g of mannitol were given. I used a C2 catheter to cannulate the right renal artery and selective imaging was performed. This confirmed a >90% long irregular stenosis of the right renal artery.  At this point I selected the glide wire and crossed the lesion without difficulty.  I then advanced a 6 Fr. Ansel sheath into the proximal right renal artery and exchanged for a Magic torque wire. I then selected a 6 mm diameter x 26 mm length Lifestream balloon expandable stent and brought this across the lesion.  This was deployed encompassing the lesion with its proximal extent going back into the aorta for a mm or two.  This was inflated to 20 ATM and the waist resolved. This landed at the end of the stenosis in the post-stenotic dilatation and I elected to extend another stent further down as we had a good landing zone another 10-15 mm distally in a long main right renal artery.  Another 6 mm  x 26 mm Lifestream stent was deployed  with about 10 mm overlap to land in the normal main right renal artery. Completion angiogram showed excellent nephrogram and good flow with less than 10% residual stenosis. At this point, I elected to terminate the procedure. Oblique arteriogram was performed of the right femoral artery and StarClose closure device was deployed in the usual fashion with excellent hemostatic result. The patient was taken to the recovery room in stable condition having tolerated the procedure well.  Findings:  Aortogram/Renal Arteries:This demonstrated calcific and irregular aorta and iliac segments. The right common iliac artery had what appeared to be a moderate stenosis in the 50% range but no other hemodynamically significant stenosis was seen in the aortoiliac segments. The left renal artery was clearly occluded with no nephrogram. The right renal artery appeared to have a high grade stenosis with marked post-stenotic dilatation. Selective imaging of the right renal artery showed a greater than 90% long irregular stenosis with marked poststenotic dilatation.   Condition:  Stable  Complications: None   Leotis Pain 02/17/2017 12:25 PM  This note was created with Dragon Medical transcription system. Any errors in dictation are purely unintentional.

## 2017-02-18 ENCOUNTER — Encounter: Payer: Self-pay | Admitting: Vascular Surgery

## 2017-02-19 ENCOUNTER — Encounter: Payer: Self-pay | Admitting: Vascular Surgery

## 2017-02-19 ENCOUNTER — Other Ambulatory Visit: Payer: Self-pay | Admitting: Internal Medicine

## 2017-02-24 ENCOUNTER — Other Ambulatory Visit: Payer: Self-pay | Admitting: Internal Medicine

## 2017-02-25 ENCOUNTER — Other Ambulatory Visit: Payer: Self-pay | Admitting: Cardiovascular Disease

## 2017-02-25 ENCOUNTER — Telehealth: Payer: Self-pay | Admitting: Internal Medicine

## 2017-02-25 MED ORDER — PANTOPRAZOLE SODIUM 40 MG PO TBEC: 40.0000 mg | DELAYED_RELEASE_TABLET | Freq: Two times a day (BID) | ORAL | 1 refills | Status: DC

## 2017-02-25 NOTE — Telephone Encounter (Signed)
ok'd rx for protonix 40mg  bid #60 with one refill.

## 2017-02-25 NOTE — Telephone Encounter (Signed)
Pt called requesting a refill on her pantoprazole (PROTONIX) 40 MG tablet. Pt stated that they uped her dosage to 2 times daily. Please advise, thank you!  Pharmacy - [id number] - [place], [place]

## 2017-02-25 NOTE — Telephone Encounter (Signed)
Please advise 

## 2017-03-22 ENCOUNTER — Other Ambulatory Visit: Payer: Self-pay | Admitting: Internal Medicine

## 2017-03-23 ENCOUNTER — Ambulatory Visit (INDEPENDENT_AMBULATORY_CARE_PROVIDER_SITE_OTHER): Payer: Medicare HMO | Admitting: *Deleted

## 2017-03-23 DIAGNOSIS — Z23 Encounter for immunization: Secondary | ICD-10-CM

## 2017-03-23 DIAGNOSIS — E538 Deficiency of other specified B group vitamins: Secondary | ICD-10-CM

## 2017-03-23 MED ORDER — CYANOCOBALAMIN 1000 MCG/ML IJ SOLN
1000.0000 ug | Freq: Once | INTRAMUSCULAR | Status: AC
  Administered 2017-03-23: 1000 ug via INTRAMUSCULAR

## 2017-03-23 NOTE — Progress Notes (Addendum)
Patient presented for B 12 injection to right deltoid, patient voiced no concerns nor showed any signs of distress during injection.  Reviewed.  Dr Scott 

## 2017-03-24 ENCOUNTER — Ambulatory Visit (INDEPENDENT_AMBULATORY_CARE_PROVIDER_SITE_OTHER): Payer: Medicare HMO | Admitting: Cardiovascular Disease

## 2017-03-24 ENCOUNTER — Encounter: Payer: Self-pay | Admitting: Cardiovascular Disease

## 2017-03-24 VITALS — BP 138/90 | HR 83 | Ht 65.0 in | Wt 126.5 lb

## 2017-03-24 DIAGNOSIS — I5042 Chronic combined systolic (congestive) and diastolic (congestive) heart failure: Secondary | ICD-10-CM

## 2017-03-24 DIAGNOSIS — I420 Dilated cardiomyopathy: Secondary | ICD-10-CM

## 2017-03-24 DIAGNOSIS — I701 Atherosclerosis of renal artery: Secondary | ICD-10-CM

## 2017-03-24 DIAGNOSIS — IMO0001 Reserved for inherently not codable concepts without codable children: Secondary | ICD-10-CM

## 2017-03-24 DIAGNOSIS — F17200 Nicotine dependence, unspecified, uncomplicated: Secondary | ICD-10-CM

## 2017-03-24 DIAGNOSIS — N184 Chronic kidney disease, stage 4 (severe): Secondary | ICD-10-CM

## 2017-03-24 DIAGNOSIS — J432 Centrilobular emphysema: Secondary | ICD-10-CM

## 2017-03-24 MED ORDER — CARVEDILOL 6.25 MG PO TABS: 6.2500 mg | ORAL_TABLET | Freq: Two times a day (BID) | ORAL | 3 refills | Status: AC

## 2017-03-24 MED ORDER — FUROSEMIDE 20 MG PO TABS: 40.0000 mg | ORAL_TABLET | Freq: Two times a day (BID) | ORAL | 3 refills | Status: AC | PRN

## 2017-03-24 MED ORDER — CLOPIDOGREL BISULFATE 75 MG PO TABS: 75.0000 mg | ORAL_TABLET | Freq: Every day | ORAL | 3 refills | Status: AC

## 2017-03-24 NOTE — Patient Instructions (Addendum)

## 2017-03-24 NOTE — Progress Notes (Signed)
Cardiology Office Note  Date:  03/24/2017   ID:  Sandra Ellison, DOB [birth year], MRN [id number]  PCP:  Einar Pheasant, MD   Chief Complaint  Patient presents with  . other    3 month f/u c/o chest pain and sob . Meds reviewed verbally with pt.    HPI:  71 year old woman with a history of  Pulmonary hypertension,  systolic and diastolic CHF with ejection fraction 25%,  COPD  Active smoker, catheterization February 2017 with moderate left main disease,  decreased pulse in her left arm,  peptic ulcer disease, anemia, chronic renal insufficiency who presents for follow-up of her cardiomyopathy.  History of hyperkalemia on  ARBs/Aldactone Who presents for routine follow-up of her cardiomyopathy  Discharge from the hospital 12/16/2016 with shortness of breath, respiratory failure, anemia She had EGD and colonoscopy  PACU area patient was found to be tachypneic, febrile at 100.6.  BiPAP.  chest x-ray does not show any infiltrates or pulmonary edema.  Dual neb nebulizer has been given.  some right shoulder pain which is chronic pain   Seen by Dr. Lucky Cowboy,  Left renal artery occlusion,  right renal artery stenosis, renovascular hypertension, ischemic nephropathy Stent placement 02/17/2017   No significant weight gain, leg swelling, abdominal bloating Not on lasix, no  Leg edema  Chronic pain, Out of pain meds, muscle relaxers One year of periodic flushing sx  presenting in a wheelchair  Lab work reviewed with her in detail Total bili 1.3  EKG personally reviewed by myself on todays visit Shows NSR rate 83 bpm, T wave abnormality I and AVL  Other past medical history reviewed Depressed ejection fraction out of proportion to coronary artery disease Unable to tolerate ARB's, Aldactone secondary to hyperkalemia Previously on lisinopril, minimal increase in potassium  stroke in June 2017 Left side deficits,gait instability Acute stroke seen on MRI left cerebellum Carotid  ultrasound showing mild to moderate carotid disease bilaterally Echocardiogram during the hospital course showing ejection fraction less than 25% discharged on Plavix  peptic ulcer disease, cardiomyopathy with history of systolic and diastolic CHF, COPD with long smoking history who continues to smoke, pulmonary hypertension seen on echocardiogram in March 2012 who presented to [facility] with increasing shortness of breath on February 11, 2011, chronic renal insufficiency, stage II, severe iron deficiency anemia, history of systolic heart failure, cardiac catheterization February 2017 for worsening ejection fraction found to have moderate left main disease, who presents for follow-up of her systolic CHF. Decreased pulse in the left arm  She has neuropathy in her legs. Other workup including CT scan of the chest 06/19/2015 showing coronary calcifications, aortic atherosclerosis. Images reviewed with her in detail  Ultrasound of the abdomen showing kidney stones, sludge in the gallbladder, atrophic left kidney. Results discussed with her She continues to smoke  Previously taken off entresto and ARB's for high potassium bidil caused headaches Reports she is tolerating hydralazine Was previously on lisinopril 10 mg daily several years ago and reports having no side effect  cardiac catheterization details : LM lesion, 65% stenosed, Ost LAD lesion, 50% stenosed, Dist Cx to LPDA lesion, 80% stenosed, 2nd Mrg lesion, 40% stenosed.  Seen in the emergency room twice, once in 05/29/2015 for shortness of breath with cough felt to be from COPD exacerbation, given a Z-Pak and steroids Second trip to the ER 07/18/2015 for nausea vomiting felt to be a GI bug  Recent CT scan of the chest reviewed with her in detail showing severe three-vessel coronary artery  disease, moderate plaquing in her aorta.  Last echocardiogram December 2015 showing ejection fraction 20-25% which was down from 30-35% in August  2013  Echo done in the hospital 05/17/2014 was reviewed with her showing ejection fraction 20-25%, mildly elevated right trigger systolic pressures  Previous hospital admission in 2012 she was found to have CHF, moderate bilateral pleural effusions with BNP of 26,000. Underlying anemia was a significant component of her presentation. Previously received procrit every other week, bone marrow biopsy, received packed red blood cells x2, iron transfusion x2, followed by Dr. Ma Hillock.  Echocardiogram in the hospital showed ejection fraction 30-35%, mild to moderate right ventricular systolic pressure estimated at 40-50 mmHg  Echocardiogram performed February 11, 2011 shows ejection fraction less than 25% with severe global hypokinesis, apical akinesis, normal right ventricular systolic function , mild to moderate mitral valve regurgitation, normal right ventricular systolic pressures  Stress test done in the hospital on February 13 2011 shows no significant ischemia.   PMH:   has a past medical history of Allergy; Arthritis; Carotid arterial disease ([place]); Chronic combined systolic and diastolic CHF (congestive heart failure) ([place]); Chronic kidney disease (CKD), stage II (mild); COPD (chronic obstructive pulmonary disease) ([place]); Dilated cardiomyopathy ([place]); Dyspnea; GERD (gastroesophageal reflux disease); History of abnormal Pap smear; History of diverticulosis; colonic polyps; Hyperkalemia; Hypertension; Iron deficiency anemia; Pulmonary HTN ([place]); Stroke [facility]); TIA (transient ischemic attack); and Tobacco abuse.  PSH:    Past Surgical History:  Procedure Laterality Date  . CARDIAC CATHETERIZATION Left 08/05/2015   Procedure: Left Heart Cath and Coronary Angiography;  Surgeon: Minna Merritts, MD;  Location: [place];  Service: Cardiovascular;  Laterality: Left;  . COLONOSCOPY  10/2010  . COLONOSCOPY WITH PROPOFOL N/A 12/15/2016   Procedure: COLONOSCOPY WITH PROPOFOL;  Surgeon:  [name], MD;  Location: [facility];  Service: Endoscopy;  Laterality: N/A;  . ESOPHAGOGASTRODUODENOSCOPY (EGD) WITH PROPOFOL N/A 12/15/2016   Procedure: ESOPHAGOGASTRODUODENOSCOPY (EGD) WITH PROPOFOL;  Surgeon: [name], MD;  Location: [facility];  Service: Endoscopy;  Laterality: N/A;  . ORIF DISTAL RADIUS FRACTURE  2011   right  . RENAL ANGIOGRAPHY Right 02/17/2017   Procedure: Renal Angiography;  Surgeon: Algernon Huxley, MD;  Location: [place] [facility];  Service: Cardiovascular;  Laterality: Right;  . TONSILLECTOMY  age 26  . VAGINAL HYSTERECTOMY  1981   secondary to abnormal pap smear    Current Outpatient Prescriptions  Medication Sig Dispense Refill  . albuterol (PROVENTIL) (2.5 MG/3ML) 0.083% nebulizer solution USE ONE TREATMENT EVERY 6 HOURS AS NEEDED (Patient taking differently: USE ONE TREATMENT EVERY 6 HOURS AS NEEDED FOR WHEEZING/SHORTNESS OF BREATH) 180 mL 1  . albuterol (VENTOLIN HFA) 108 (90 Base) MCG/ACT inhaler Inhale 2 puffs every 6 hours as needed 18 g 2  . aspirin EC 81 MG tablet Take 81 mg by mouth daily.    Marland Kitchen atorvastatin (LIPITOR) 40 MG tablet TAKE 1 TABLET (40 MG TOTAL) BY MOUTH DAILY. 30 tablet 2  . carvedilol (COREG) 6.25 MG tablet TAKE 1 TABLET (6.25 MG TOTAL) BY MOUTH 2 (TWO) TIMES DAILY WITH A MEAL. 60 tablet 3  . citalopram (CELEXA) 10 MG tablet TAKE 1 TABLET (10 MG TOTAL) BY MOUTH DAILY. (Patient taking differently: TAKE 1 TABLET (10 MG TOTAL) BY MOUTH DAILY IN THE EVENING.) 30 tablet 3  . clopidogrel (PLAVIX) 75 MG tablet TAKE 1 TABLET (75 MG TOTAL) BY MOUTH DAILY. 30 tablet 5  . Cyanocobalamin 1000 MCG/ML KIT Inject 1 mL as  directed every 30 (thirty) days.    . Fe Fum-FePoly-Vit C-Vit B3 (INTEGRA) 62.5-62.5-40-3 MG CAPS One per day (Patient taking differently: Take 1 capsule by mouth at bedtime. ) 30 capsule 2  . furosemide (LASIX) 20 MG tablet Take 2 tablets (40 mg total) by mouth 2 (two) times daily as needed. 120 tablet 6  .  gabapentin (NEURONTIN) 100 MG capsule Take 100 mg by mouth 3 (three) times daily.    Marland Kitchen gabapentin (NEURONTIN) 300 MG capsule Take 1 capsule (300 mg total) by mouth 3 (three) times daily. 270 capsule 1  . LORazepam (ATIVAN) 0.5 MG tablet Take 1 tablet (0.5 mg total) by mouth every 8 (eight) hours as needed for anxiety. 30 tablet 0  . magnesium oxide (MAG-OX) 400 MG tablet TAKE 1 TABLET BY MOUTH EVERY DAY 30 tablet 0  . pantoprazole (PROTONIX) 40 MG tablet Take 1 tablet (40 mg total) by mouth 2 (two) times daily. 60 tablet 1  . SPIRIVA HANDIHALER 18 MCG inhalation capsule INHALE ONCE DAILY 30 capsule 5  . triamcinolone ([employer] NASAL ALLERGY SPRAY) 55 MCG/ACT AERO nasal inhaler Place 2 sprays into the nose daily as needed (for allergies).    . Vitamin D, Ergocalciferol, (DRISDOL) 50000 units CAPS capsule Take 50,000 Units by mouth every Friday.      No current facility-administered medications for this visit.      Allergies:   Ranitidine hcl and Entresto [sacubitril-valsartan]   Social History:  The patient  reports that she has been smoking Cigarettes.  She has a 21.00 pack-year smoking history. She has never used smokeless tobacco. She reports that she does not drink alcohol or use drugs.   Family History:   family history includes Alcohol abuse in her father; Arthritis in her father and mother; Cancer in her father.    Review of Systems: Review of Systems  Constitutional: Positive for malaise/fatigue.       Weight gain  Respiratory: Negative.   Cardiovascular: Negative.   Gastrointestinal: Negative.        Abdominal bloating  Musculoskeletal: Negative.        Back pain, joint pain, leg pain  Neurological: Negative.   Psychiatric/Behavioral: Negative.   All other systems reviewed and are negative.    PHYSICAL EXAM: VS:  BP 138/90 (BP Location: Right Arm, Patient Position: Sitting, Cuff Size: Normal)   Pulse 83   Ht 5' 5"  (1.651 m)   Wt 126 lb 8 oz (57.4 kg)   BMI 21.05 kg/m  ,  BMI Body mass index is 21.05 kg/m. GEN: Well nourished, well developed, in no acute distress, presenting in a wheelchair  HEENT: normal  Neck: no JVD, carotid bruits, or masses Cardiac: RRR; S4, no murmurs, rubs, or gallops, 1-2 + lower extremity edema Respiratory:  Mildly decreased breath sounds throughout,  normal work of breathing GI: soft, nontender, nondistended, + BS MS: no deformity or atrophy  Skin: warm and dry, no rash Neuro:  Strength and sensation are intact Psych: euthymic mood, full affect     Recent Labs: 10/13/2016: TSH 3.39 01/22/2017: Hemoglobin 14.8; Platelets 181.0; Sodium 141 02/03/2017: ALT 15 02/17/2017: BUN 35; Creatinine, Ser 1.61; Potassium 3.7    Lipid Panel Lab Results  Component Value Date   CHOL 91 03/26/2016   HDL 30.60 (L) 03/26/2016   LDLCALC 39 03/26/2016   TRIG 105.0 03/26/2016      Wt Readings from Last 3 Encounters:  03/24/17 126 lb 8 oz (57.4 kg)  02/12/17 135 lb (61.2  kg)  01/22/17 140 lb (63.5 kg)        ASSESSMENT AND PLAN:   Hyperlipidemia, unspecified hyperlipidemia type -   stay on her Lipitor, goal LDL<70  Coronary artery disease involving native coronary artery of native heart with angina pectoris with documented spasm ([place])  Recommended smoking cessation Currently with no symptoms of angina. No further workup at this time. Continue current medication regimen.stable. No further testing ordered  Nonischemic cardiomyopathy ([place]) -  Depressed ejection fraction out of proportion to coronary artery disease Unable to tolerate ARB's, Aldactone secondary to hyperkalemia Previously on lisinopril, minimal increase in potassium Tolerating Lasix daily, down to her euvolemic state  Chronic systolic CHF (congestive heart failure) (HCC) -  Weight is stable, clinically appears euvolemic She is on beta blocker, diuretics  Tobacco abuse - Plan: EKG 10-FBPZ, Basic Metabolic Panel (BMET) 5 minutes of smoking cessation discussion  today, she does not want help Discussed with her again as on last office visit  Chronic pain She is requesting pain, has run out, leg pain, back pain Recommended she consider seeing the pain clinic    Total encounter time more than 25 minutes  Greater than 50% was spent in counseling and coordination of care with the patient  Disposition:   F/U  12 months  No orders of the defined types were placed in this encounter.    Signed, Esmond Plants, M.D., Ph.D. 03/24/2017  [place], [place]

## 2017-03-25 ENCOUNTER — Telehealth: Payer: Self-pay | Admitting: Internal Medicine

## 2017-03-25 ENCOUNTER — Other Ambulatory Visit (INDEPENDENT_AMBULATORY_CARE_PROVIDER_SITE_OTHER): Payer: Self-pay | Admitting: Vascular Surgery

## 2017-03-25 DIAGNOSIS — R17 Unspecified jaundice: Secondary | ICD-10-CM | POA: Diagnosis not present

## 2017-03-25 DIAGNOSIS — I701 Atherosclerosis of renal artery: Secondary | ICD-10-CM

## 2017-03-25 DIAGNOSIS — R932 Abnormal findings on diagnostic imaging of liver and biliary tract: Secondary | ICD-10-CM | POA: Diagnosis not present

## 2017-03-25 DIAGNOSIS — N2889 Other specified disorders of kidney and ureter: Secondary | ICD-10-CM

## 2017-03-25 DIAGNOSIS — I151 Hypertension secondary to other renal disorders: Secondary | ICD-10-CM

## 2017-03-25 NOTE — Telephone Encounter (Signed)
Pt called and wanted to see if Dr. Nicki Reaper could refill a muslce relaxer as she is still having pain from her fall. Please advise, thank you!  Pharmacy -  [id number] - [place], [place]  Call pt @ [phone]

## 2017-03-26 ENCOUNTER — Ambulatory Visit (INDEPENDENT_AMBULATORY_CARE_PROVIDER_SITE_OTHER): Payer: Medicare HMO

## 2017-03-26 ENCOUNTER — Ambulatory Visit (INDEPENDENT_AMBULATORY_CARE_PROVIDER_SITE_OTHER): Payer: Medicare HMO | Admitting: Vascular Surgery

## 2017-03-26 ENCOUNTER — Encounter (INDEPENDENT_AMBULATORY_CARE_PROVIDER_SITE_OTHER): Payer: Self-pay | Admitting: Vascular Surgery

## 2017-03-26 VITALS — BP 108/65 | HR 80 | Resp 15 | Ht 65.0 in | Wt 128.0 lb

## 2017-03-26 DIAGNOSIS — I701 Atherosclerosis of renal artery: Secondary | ICD-10-CM

## 2017-03-26 DIAGNOSIS — E782 Mixed hyperlipidemia: Secondary | ICD-10-CM

## 2017-03-26 DIAGNOSIS — N2889 Other specified disorders of kidney and ureter: Secondary | ICD-10-CM

## 2017-03-26 DIAGNOSIS — Z720 Tobacco use: Secondary | ICD-10-CM

## 2017-03-26 DIAGNOSIS — I151 Hypertension secondary to other renal disorders: Secondary | ICD-10-CM

## 2017-03-26 DIAGNOSIS — I150 Renovascular hypertension: Secondary | ICD-10-CM | POA: Diagnosis not present

## 2017-03-26 NOTE — Telephone Encounter (Signed)
LMTCB

## 2017-03-26 NOTE — Telephone Encounter (Signed)
Patient is requesting to refill robaxin. She is still in pain from fall she had about a month ago. Strained her chest and back muscles. Dr. Sabra Heck wrote the Robaxin at the walk in clinic when she was seen right after fall. She has tried to reach out to Dr. Sabra Heck but was unable to do so and the MD at the walk in would not refill robaxin. He advised tylenol. Patient says that tylenol is not helping.

## 2017-03-26 NOTE — Progress Notes (Signed)
Subjective:    Patient ID: Sandra Ellison, female    DOB: [birth year], 71 y.o.   MRN: [id number] Chief Complaint  Patient presents with  . Follow-up    4 week renal   She presents for her first post procedure follow-up. She is status post an ultrasound guidance for vascular access right femoral artery, catheter placement into right renal artery from right femoral approach, aortogram and selective right renal angiogram, balloon expandable stent placement to the right renal artery with 2 6 mm diameter x 26 mm length Lifestream stents with StarClose closure device right femoral artery. She presents today without complaint. States her blood pressure has been "normal". Today's blood pressure is 108/65. Patient underwent a renal artery duplex exam which was notable for a patent right renal artery stent without evidence of restenosis. Known occlusion of the left renal artery. When compared to the previous exam on 12/30/2016 there has been improvement in the right renal artery flow velocities. Patient denies any fever, nausea or vomiting.   Review of Systems  Constitutional: Negative.   HENT: Negative.   Eyes: Negative.   Respiratory: Negative.   Cardiovascular: Negative.   Gastrointestinal: Negative.   Endocrine: Negative.   Genitourinary: Negative.   Musculoskeletal: Negative.   Skin: Negative.   Allergic/Immunologic: Negative.   Neurological: Negative.   Hematological: Negative.   Psychiatric/Behavioral: Negative.       Objective:   Physical Exam  Constitutional: She is oriented to person, place, and time. She appears well-developed and well-nourished. No distress.  HENT:  Head: Normocephalic and atraumatic.  Eyes: Pupils are equal, round, and reactive to light. Conjunctivae are normal.  Neck: Normal range of motion.  Cardiovascular: Normal rate, regular rhythm, normal heart sounds and intact distal pulses.   Pulses:      Radial pulses are 2+ on the right side, and 2+ on the left  side.  Pulmonary/Chest: Effort normal.  Musculoskeletal: Normal range of motion. She exhibits no edema.  Neurological: She is alert and oriented to person, place, and time.  Skin: Skin is warm and dry. She is not diaphoretic.  Right groin healed.  Psychiatric: She has a normal mood and affect. Her behavior is normal. Judgment and thought content normal.  Vitals reviewed.  BP 108/65 (BP Location: Right Arm)   Pulse 80   Resp 15   Ht _0  (1.651 m)   Wt 128 lb (58.1 kg)   BMI 21.30 kg/m   Past Medical History:  Diagnosis Date  . Allergy   . Arthritis   . Carotid arterial disease ([place])    a. 02/2015 Carotid U/S: RICA 40-98%, LICA 11-91%.  . Chronic combined systolic and diastolic CHF (congestive heart failure) ([place])    a. 01/2012 Echo: EF 30-35%, mod conc LVH, sev inf HK, mildly dil LA, mild-mod MR, Trace TR, RVSP 40-15mHg.    .Marland KitchenChronic kidney disease (CKD), stage II (mild)   . COPD (chronic obstructive pulmonary disease) ([place]   . Dilated cardiomyopathy [place]    a. 02/2011 MV: EF 27%, anteroseptal, inf, inferolateral scar, no ischemia.  .Marland KitchenDyspnea   . GERD (gastroesophageal reflux disease)   . History of abnormal Pap smear    a. s/p vaginal hysterectomy  . History of diverticulosis   . Hx of colonic polyps   . Hyperkalemia    a. 02/2015 in setting of entresto/AKI->entresto d/c'd.  . Hypertension   . Iron deficiency anemia    a. h/o transfusions, prev followed by heme/onc.  .Marland Kitchen  Pulmonary HTN ([place])   . Stroke ([place])   . TIA (transient ischemic attack)   . Tobacco abuse    Social History   Social History  . Marital status: Divorced    Spouse name: N/A  . Number of children: 1  . Years of education: N/A   Occupational History  . Not on file.   Social History Main Topics  . Smoking status: Current Every Day Smoker    Packs/day: 0.50    Years: 42.00    Types: Cigarettes  . Smokeless tobacco: Never Used     Comment: Down to 3 cigarettes per day  . Alcohol use No  .  Drug use: No  . Sexual activity: No   Other Topics Concern  . Not on file   Social History Narrative  . No narrative on file   Past Surgical History:  Procedure Laterality Date  . CARDIAC CATHETERIZATION Left 08/05/2015   Procedure: Left Heart Cath and Coronary Angiography;  Surgeon: Minna Merritts, MD;  Location: [place];  Service: Cardiovascular;  Laterality: Left;  . COLONOSCOPY  10/2010  . COLONOSCOPY WITH PROPOFOL N/A 12/15/2016   Procedure: COLONOSCOPY WITH PROPOFOL;  Surgeon: [name], MD;  Location: [facility];  Service: Endoscopy;  Laterality: N/A;  . ESOPHAGOGASTRODUODENOSCOPY (EGD) WITH PROPOFOL N/A 12/15/2016   Procedure: ESOPHAGOGASTRODUODENOSCOPY (EGD) WITH PROPOFOL;  Surgeon: [name], MD;  Location: [facility];  Service: Endoscopy;  Laterality: N/A;  . ORIF DISTAL RADIUS FRACTURE  2011   right  . RENAL ANGIOGRAPHY Right 02/17/2017   Procedure: Renal Angiography;  Surgeon: Algernon Huxley, MD;  Location: [place] [facility];  Service: Cardiovascular;  Laterality: Right;  . TONSILLECTOMY  age 52  . VAGINAL HYSTERECTOMY  1981   secondary to abnormal pap smear   Family History  Problem Relation Age of Onset  . Arthritis Mother   . Alcohol abuse Father   . Arthritis Father   . Cancer Father        throat/spine  . Breast cancer Neg Hx    Allergies  Allergen Reactions  . Ranitidine Hcl Anaphylaxis and Swelling  . Entresto [Sacubitril-Valsartan] Other (See Comments)    Potassium issues      Assessment & Plan:  She presents for her first post procedure follow-up. She is status post an ultrasound guidance for vascular access right femoral artery, catheter placement into right renal artery from right femoral approach, aortogram and selective right renal angiogram, balloon expandable stent placement to the right renal artery with 2 6 mm diameter x 26 mm length Lifestream stents with StarClose closure device right femoral artery. She  presents today without complaint. States her blood pressure has been "normal". Today's blood pressure is 108/65. Patient underwent a renal artery duplex exam which was notable for a patent right renal artery stent without evidence of restenosis. Known occlusion of the left renal artery. When compared to the previous exam on 12/30/2016 there has been improvement in the right renal artery flow velocities. Patient denies any fever, nausea or vomiting.  1. Renal artery stenosis (HCC) - Improved Patient presents for her first post procedure follow-up She is status post a right renal artery angiogram with angioplasty and stent placement. She presents today without complaint Improvement in velocities on right renal artery duplex today Known left renal artery occlusion Patient to follow up in 3 months with surveillance renal HDA Patient to call sooner if she experiences any increase in blood pressure or kidney function  2. Renovascular hypertension - improved Patient with improved blood pressure status post renal artery angiogram.  3. Tobacco abuse - stable We had a discussion for approximately 10 minutes regarding the absolute need for smoking cessation due to the deleterious nature of tobacco on the vascular system. We discussed the tobacco use would diminish patency of any intervention, and likely significantly worsen progressio of disease. We discussed multiple agents for quitting including replacement therapy or medications to reduce cravings such as Chantix. The patient voices their understanding of the importance of smoking cessation.  4. Mixed hyperlipidemia - stable Encouraged good control as its slows the progression of atherosclerotic disease  Current Outpatient Prescriptions on File Prior to Visit  Medication Sig Dispense Refill  . albuterol (PROVENTIL) (2.5 MG/3ML) 0.083% nebulizer solution USE ONE TREATMENT EVERY 6 HOURS AS NEEDED (Patient taking differently: USE ONE TREATMENT EVERY 6  HOURS AS NEEDED FOR WHEEZING/SHORTNESS OF BREATH) 180 mL 1  . albuterol (VENTOLIN HFA) 108 (90 Base) MCG/ACT inhaler Inhale 2 puffs every 6 hours as needed 18 g 2  . aspirin EC 81 MG tablet Take 81 mg by mouth daily.    Marland Kitchen atorvastatin (LIPITOR) 40 MG tablet TAKE 1 TABLET (40 MG TOTAL) BY MOUTH DAILY. 30 tablet 2  . carvedilol (COREG) 6.25 MG tablet Take 1 tablet (6.25 mg total) by mouth 2 (two) times daily with a meal. 180 tablet 3  . citalopram (CELEXA) 10 MG tablet TAKE 1 TABLET (10 MG TOTAL) BY MOUTH DAILY. (Patient taking differently: TAKE 1 TABLET (10 MG TOTAL) BY MOUTH DAILY IN THE EVENING.) 30 tablet 3  . clopidogrel (PLAVIX) 75 MG tablet Take 1 tablet (75 mg total) by mouth daily. 90 tablet 3  . Cyanocobalamin 1000 MCG/ML KIT Inject 1 mL as directed every 30 (thirty) days.    . Fe Fum-FePoly-Vit C-Vit B3 (INTEGRA) 62.5-62.5-40-3 MG CAPS One per day (Patient taking differently: Take 1 capsule by mouth at bedtime. ) 30 capsule 2  . furosemide (LASIX) 20 MG tablet Take 2 tablets (40 mg total) by mouth 2 (two) times daily as needed. 360 tablet 3  . gabapentin (NEURONTIN) 100 MG capsule Take 100 mg by mouth 3 (three) times daily.    Marland Kitchen gabapentin (NEURONTIN) 300 MG capsule Take 1 capsule (300 mg total) by mouth 3 (three) times daily. 270 capsule 1  . LORazepam (ATIVAN) 0.5 MG tablet Take 1 tablet (0.5 mg total) by mouth every 8 (eight) hours as needed for anxiety. 30 tablet 0  . magnesium oxide (MAG-OX) 400 MG tablet TAKE 1 TABLET BY MOUTH EVERY DAY 30 tablet 0  . pantoprazole (PROTONIX) 40 MG tablet Take 1 tablet (40 mg total) by mouth 2 (two) times daily. 60 tablet 1  . SPIRIVA HANDIHALER 18 MCG inhalation capsule INHALE ONCE DAILY 30 capsule 5  . triamcinolone ([employer]S NASAL ALLERGY SPRAY) 55 MCG/ACT AERO nasal inhaler Place 2 sprays into the nose daily as needed (for allergies).    . Vitamin D, Ergocalciferol, (DRISDOL) 50000 units CAPS capsule Take 50,000 Units by mouth every Friday.      No  current facility-administered medications on file prior to visit.     There are no Patient Instructions on file for this visit. No Follow-up on file.   Roderic Lammert A Curties Conigliaro, PA-C

## 2017-03-26 NOTE — Telephone Encounter (Signed)
Pain is not increased and not constant, it is just taking awhile to heal like Dr. Sabra Heck told her it would but when it does hurt it is a 10/10. She has been using ice and tylenol. She was taking the robaxin 3-4 times a day until she ran out. She says she does not recall seeing Dr. Aris Lot.

## 2017-03-26 NOTE — Telephone Encounter (Signed)
Need to clarify a couple of things.  If she is continuing to have increased pain from the fall, she may need to be reevaluated.  How often is she taking the muscle relaxer?  Also, she saw Dr Aris Lot after fall.  Is she planning to f/u with him?

## 2017-03-26 NOTE — Telephone Encounter (Signed)
Attempted to call patient. Phone went straight to voicemail.

## 2017-03-28 NOTE — Telephone Encounter (Signed)
Called her multiple times this weekend to try to clarify a few things with her regarding her pain.  I do not mind calling in muscle relaxer.  I am concerned about her taking this 3-4 times per day.  Did she have any problems with the muscle relaxer?  Did it make her sleepy or groggy?  May need to do at reduced doses per day.  Just let me know and we will get something called in.   If persistent increased pain, will need to be evaluated.

## 2017-03-29 DIAGNOSIS — I129 Hypertensive chronic kidney disease with stage 1 through stage 4 chronic kidney disease, or unspecified chronic kidney disease: Secondary | ICD-10-CM | POA: Diagnosis not present

## 2017-03-29 DIAGNOSIS — R809 Proteinuria, unspecified: Secondary | ICD-10-CM | POA: Diagnosis not present

## 2017-03-29 DIAGNOSIS — N183 Chronic kidney disease, stage 3 (moderate): Secondary | ICD-10-CM | POA: Diagnosis not present

## 2017-03-29 DIAGNOSIS — I701 Atherosclerosis of renal artery: Secondary | ICD-10-CM | POA: Diagnosis not present

## 2017-03-29 DIAGNOSIS — N2581 Secondary hyperparathyroidism of renal origin: Secondary | ICD-10-CM | POA: Diagnosis not present

## 2017-03-29 NOTE — Addendum Note (Signed)
Addended by: Britt Bottom on: 03/29/2017 08:16 AM   Modules accepted: Orders

## 2017-03-29 NOTE — Telephone Encounter (Signed)
Patient says that she apologizes that she missed your call. She did not have any problems with the muscle relaxer she says that she did not have any side effects. No sleepiness or grogginess. She does not have any intentions in taking them as much as she was.

## 2017-03-30 ENCOUNTER — Other Ambulatory Visit: Payer: Self-pay | Admitting: Internal Medicine

## 2017-03-30 NOTE — Progress Notes (Signed)
Opened in error

## 2017-03-30 NOTE — Telephone Encounter (Signed)
Please notify pt that I reviewed the chart and her acute care information.  She was evaluated 02/03/17.  Was given rx then.  If she is now having worsening pain, I would recommend reevaluation.  I did not realize the amount of time since she had been evaluated.  Sorry for the confusion.  She is taking tylenol.  I recommend scheduled tylenol - can take two extra strength tylenol up to tid.  See me before calling pt.  Thanks

## 2017-03-30 NOTE — Telephone Encounter (Signed)
LMTCB

## 2017-04-15 ENCOUNTER — Other Ambulatory Visit: Payer: Self-pay

## 2017-04-15 MED ORDER — PANTOPRAZOLE SODIUM 40 MG PO TBEC
40.0000 mg | DELAYED_RELEASE_TABLET | Freq: Two times a day (BID) | ORAL | 0 refills | Status: DC
Start: 2017-04-15 — End: 2017-04-26

## 2017-04-20 ENCOUNTER — Other Ambulatory Visit: Payer: Self-pay

## 2017-04-20 MED ORDER — CITALOPRAM HYDROBROMIDE 10 MG PO TABS: ORAL_TABLET | ORAL | 0 refills | Status: DC

## 2017-04-26 ENCOUNTER — Other Ambulatory Visit: Payer: Self-pay

## 2017-04-26 MED ORDER — PANTOPRAZOLE SODIUM 40 MG PO TBEC: 40.0000 mg | DELAYED_RELEASE_TABLET | Freq: Two times a day (BID) | ORAL | 1 refills | Status: DC

## 2017-05-03 ENCOUNTER — Encounter: Payer: Self-pay | Admitting: Internal Medicine

## 2017-05-03 ENCOUNTER — Ambulatory Visit (INDEPENDENT_AMBULATORY_CARE_PROVIDER_SITE_OTHER): Payer: Medicare HMO | Admitting: Internal Medicine

## 2017-05-03 ENCOUNTER — Ambulatory Visit (INDEPENDENT_AMBULATORY_CARE_PROVIDER_SITE_OTHER): Payer: Medicare HMO

## 2017-05-03 VITALS — BP 118/68 | HR 68 | Temp 97.4°F | Ht 65.0 in | Wt 128.0 lb

## 2017-05-03 DIAGNOSIS — J432 Centrilobular emphysema: Secondary | ICD-10-CM | POA: Diagnosis not present

## 2017-05-03 DIAGNOSIS — K219 Gastro-esophageal reflux disease without esophagitis: Secondary | ICD-10-CM

## 2017-05-03 DIAGNOSIS — E782 Mixed hyperlipidemia: Secondary | ICD-10-CM | POA: Diagnosis not present

## 2017-05-03 DIAGNOSIS — K5900 Constipation, unspecified: Secondary | ICD-10-CM

## 2017-05-03 DIAGNOSIS — I10 Essential (primary) hypertension: Secondary | ICD-10-CM

## 2017-05-03 DIAGNOSIS — I428 Other cardiomyopathies: Secondary | ICD-10-CM | POA: Diagnosis not present

## 2017-05-03 DIAGNOSIS — I701 Atherosclerosis of renal artery: Secondary | ICD-10-CM

## 2017-05-03 DIAGNOSIS — R079 Chest pain, unspecified: Secondary | ICD-10-CM

## 2017-05-03 DIAGNOSIS — Z0000 Encounter for general adult medical examination without abnormal findings: Secondary | ICD-10-CM

## 2017-05-03 DIAGNOSIS — I739 Peripheral vascular disease, unspecified: Secondary | ICD-10-CM

## 2017-05-03 DIAGNOSIS — N184 Chronic kidney disease, stage 4 (severe): Secondary | ICD-10-CM

## 2017-05-03 DIAGNOSIS — D649 Anemia, unspecified: Secondary | ICD-10-CM

## 2017-05-03 DIAGNOSIS — M546 Pain in thoracic spine: Secondary | ICD-10-CM | POA: Diagnosis not present

## 2017-05-03 DIAGNOSIS — I25118 Atherosclerotic heart disease of native coronary artery with other forms of angina pectoris: Secondary | ICD-10-CM | POA: Diagnosis not present

## 2017-05-03 DIAGNOSIS — F17200 Nicotine dependence, unspecified, uncomplicated: Secondary | ICD-10-CM | POA: Diagnosis not present

## 2017-05-03 DIAGNOSIS — I779 Disorder of arteries and arterioles, unspecified: Secondary | ICD-10-CM

## 2017-05-03 DIAGNOSIS — I272 Pulmonary hypertension, unspecified: Secondary | ICD-10-CM

## 2017-05-03 DIAGNOSIS — N183 Chronic kidney disease, stage 3 unspecified: Secondary | ICD-10-CM

## 2017-05-03 DIAGNOSIS — IMO0001 Reserved for inherently not codable concepts without codable children: Secondary | ICD-10-CM

## 2017-05-03 DIAGNOSIS — S299XXA Unspecified injury of thorax, initial encounter: Secondary | ICD-10-CM | POA: Diagnosis not present

## 2017-05-03 LAB — CBC WITH DIFFERENTIAL/PLATELET
BASOS ABS: 0 10*3/uL (ref 0.0–0.1)
BASOS PCT: 0.7 % (ref 0.0–3.0)
Eosinophils Absolute: 0.1 10*3/uL (ref 0.0–0.7)
Eosinophils Relative: 1.2 % (ref 0.0–5.0)
HEMATOCRIT: 44.2 % (ref 36.0–46.0)
Hemoglobin: 14.6 g/dL (ref 12.0–15.0)
LYMPHS ABS: 1 10*3/uL (ref 0.7–4.0)
LYMPHS PCT: 16.6 % (ref 12.0–46.0)
MCHC: 33 g/dL (ref 30.0–36.0)
MCV: 98 fl (ref 78.0–100.0)
MONOS PCT: 9.3 % (ref 3.0–12.0)
Monocytes Absolute: 0.5 10*3/uL (ref 0.1–1.0)
NEUTROS ABS: 4.2 10*3/uL (ref 1.4–7.7)
NEUTROS PCT: 72.2 % (ref 43.0–77.0)
PLATELETS: 230 10*3/uL (ref 150.0–400.0)
RBC: 4.51 Mil/uL (ref 3.87–5.11)
RDW: 16.1 % — AB (ref 11.5–15.5)
WBC: 5.8 10*3/uL (ref 4.0–10.5)

## 2017-05-03 LAB — BASIC METABOLIC PANEL
BUN: 17 mg/dL (ref 6–23)
CALCIUM: 9.4 mg/dL (ref 8.4–10.5)
CO2: 29 mEq/L (ref 19–32)
CREATININE: 1.29 mg/dL — AB (ref 0.40–1.20)
Chloride: 101 mEq/L (ref 96–112)
GFR: 43.3 mL/min — AB (ref >60.00)
Glucose, Bld: 88 mg/dL (ref 70–99)
Potassium: 4 mEq/L (ref 3.5–5.1)
Sodium: 139 mEq/L (ref 135–145)

## 2017-05-03 LAB — LIPID PANEL
CHOL/HDL RATIO: 4
CHOLESTEROL: 128 mg/dL (ref 0–200)
HDL: 30.7 mg/dL — AB (ref >39.00)
LDL Cholesterol: 75 mg/dL (ref 0–99)
NonHDL: 96.97
TRIGLYCERIDES: 110 mg/dL (ref 0.0–149.0)
VLDL: 22 mg/dL (ref 0.0–40.0)

## 2017-05-03 LAB — FERRITIN: Ferritin: 101.5 ng/mL (ref 10.0–291.0)

## 2017-05-03 LAB — VITAMIN B12: VITAMIN B 12: 603 pg/mL (ref 211–911)

## 2017-05-03 LAB — HEPATIC FUNCTION PANEL
ALBUMIN: 4 g/dL (ref 3.5–5.2)
ALT: 16 U/L (ref 0–35)
AST: 46 U/L — ABNORMAL HIGH (ref 0–37)
Alkaline Phosphatase: 79 U/L (ref 39–117)
Bilirubin, Direct: 0.3 mg/dL (ref 0.0–0.3)
TOTAL PROTEIN: 6.8 g/dL (ref 6.0–8.3)
Total Bilirubin: 1.2 mg/dL (ref 0.2–1.2)

## 2017-05-03 MED ORDER — TIZANIDINE HCL 4 MG PO TABS: 4.0000 mg | ORAL_TABLET | Freq: Every evening | ORAL | 0 refills | Status: DC | PRN

## 2017-05-03 MED ORDER — CYANOCOBALAMIN 1000 MCG/ML IJ SOLN
1000.0000 ug | Freq: Once | INTRAMUSCULAR | Status: AC
  Administered 2017-05-03: 1000 ug via INTRAMUSCULAR

## 2017-05-03 NOTE — Progress Notes (Signed)
Patient ID: Sandra Ellison, female   DOB: 01/21/1946, 71 y.o.   MRN: [id number]   Subjective:    Patient ID: Sandra Ellison, female    DOB: [birth year], 71 y.o.   MRN: [id number]  HPI  Patient here for her physical exam.  She had a fall recently.  Went to acute care.  See note.  Was given muscle relaxer.  Helped, but ran out.  Used her arms to brace her fall.  Since her fall has had persistent soreness across her anterior chest and through to her back.  Using aspercream.  Has helped. Some days worse then others.  Still with increased pain.  Request to have something to help with pain.  Saw Dr Rockey Situ 03/26/17.  Stable.  Is s/p stent of right renal artery.  Doing well.  Left renal artery occluded.  Seeing vascular surgery.  Recommended f/u in 3 months.  Also seeing nephrology as well.  Breathing stable.  No acid reflux reported.  No abdominal pain.  Bowels moving.     Past Medical History:  Diagnosis Date  . Allergy   . Arthritis   . Carotid arterial disease ([place])    a. 02/2015 Carotid U/S: RICA 27-06%, LICA 23-76%.  . Chronic combined systolic and diastolic CHF (congestive heart failure) ([place])    a. 01/2012 Echo: EF 30-35%, mod conc LVH, sev inf HK, mildly dil LA, mild-mod MR, Trace TR, RVSP 40-76mHg.    .Marland KitchenChronic kidney disease (CKD), stage II (mild)   . COPD (chronic obstructive pulmonary disease) ([place]   . Dilated cardiomyopathy ([place]    a. 02/2011 MV: EF 27%, anteroseptal, inf, inferolateral scar, no ischemia.  .Marland KitchenDyspnea   . GERD (gastroesophageal reflux disease)   . History of abnormal Pap smear    a. s/p vaginal hysterectomy  . History of diverticulosis   . Hx of colonic polyps   . Hyperkalemia    a. 02/2015 in setting of entresto/AKI->entresto d/c'd.  . Hypertension   . Iron deficiency anemia    a. h/o transfusions, prev followed by heme/onc.  . Pulmonary HTN ([place]   . [place]roke ([place]   . TIA (transient ischemic attack)   . Tobacco abuse    Past Surgical History:  Procedure  Laterality Date  . CARDIAC CATHETERIZATION Left 08/05/2015   Procedure: Left Heart Cath and Coronary Angiography;  Surgeon: TMinna Merritts MD;  [facility];  Service: Cardiovascular;  Laterality: Left;  . COLONOSCOPY  10/2010  . COLONOSCOPY WITH PROPOFOL N/A 12/15/2016   Procedure: COLONOSCOPY WITH PROPOFOL;  Surgeon: BRobert Bellow MD;  Location: [facility] ENDOSCOPY;  Service: Endoscopy;  Laterality: N/A;  . ESOPHAGOGASTRODUODENOSCOPY (EGD) WITH PROPOFOL N/A 12/15/2016   Procedure: ESOPHAGOGASTRODUODENOSCOPY (EGD) WITH PROPOFOL;  Surgeon: BRobert Bellow MD;  Location: [facility] ENDOSCOPY;  Service: Endoscopy;  Laterality: N/A;  . ORIF DISTAL RADIUS FRACTURE  2011   right  . RENAL ANGIOGRAPHY Right 02/17/2017   Procedure: Renal Angiography;  Surgeon: DAlgernon Huxley MD;  [facility];  Service: Cardiovascular;  Laterality: Right;  . TONSILLECTOMY  age 71 . VAGINAL HYSTERECTOMY  1981   secondary to abnormal pap smear   Family History  Problem Relation Age of Onset  . Arthritis Mother   . Alcohol abuse Father   . Arthritis Father   . Cancer Father        throat/spine  . Breast cancer Neg Hx    Social History   Socioeconomic History  .  Marital status: Divorced    Spouse name: None  . Number of children: 1  . Years of education: None  . Highest education level: None  Social Needs  . Financial resource strain: None  . Food insecurity - worry: None  . Food insecurity - inability: None  . Transportation needs - medical: None  . Transportation needs - non-medical: None  Occupational History  . None  Tobacco Use  . Smoking status: Current Every Day Smoker    Packs/day: 0.50    Years: 42.00    Pack years: 21.00    Types: Cigarettes  . Smokeless tobacco: Never Used  . Tobacco comment: Down to 3 cigarettes per day  Substance and Sexual Activity  . Alcohol use: No    Alcohol/week: 0.0 oz  . Drug use: No  . Sexual activity: No  Other Topics  Concern  . None  Social History Narrative  . None    Outpatient Encounter Medications as of 05/03/2017  Medication Sig  . albuterol (PROVENTIL) (2.5 MG/3ML) 0.083% nebulizer solution USE ONE TREATMENT EVERY 6 HOURS AS NEEDED (Patient taking differently: USE ONE TREATMENT EVERY 6 HOURS AS NEEDED FOR WHEEZING/SHORTNESS OF BREATH)  . albuterol (VENTOLIN HFA) 108 (90 Base) MCG/ACT inhaler Inhale 2 puffs every 6 hours as needed  . aspirin EC 81 MG tablet Take 81 mg by mouth daily.  Marland Kitchen atorvastatin (LIPITOR) 40 MG tablet TAKE 1 TABLET (40 MG TOTAL) BY MOUTH DAILY.  . carvedilol (COREG) 6.25 MG tablet Take 1 tablet (6.25 mg total) by mouth 2 (two) times daily with a meal.  . citalopram (CELEXA) 10 MG tablet TAKE 1 TABLET (10 MG TOTAL) BY MOUTH DAILY IN THE EVENING.  Marland Kitchen clopidogrel (PLAVIX) 75 MG tablet Take 1 tablet (75 mg total) by mouth daily.  . Cyanocobalamin 1000 MCG/ML KIT Inject 1 mL as directed every 30 (thirty) days.  . Fe Fum-FePoly-Vit C-Vit B3 (INTEGRA) 62.5-62.5-40-3 MG CAPS One per day (Patient taking differently: Take 1 capsule by mouth at bedtime. )  . furosemide (LASIX) 20 MG tablet Take 2 tablets (40 mg total) by mouth 2 (two) times daily as needed.  . gabapentin (NEURONTIN) 100 MG capsule Take 100 mg by mouth 3 (three) times daily.  Marland Kitchen gabapentin (NEURONTIN) 300 MG capsule Take 1 capsule (300 mg total) by mouth 3 (three) times daily.  . magnesium oxide (MAG-OX) 400 MG tablet TAKE 1 TABLET BY MOUTH EVERY DAY  . pantoprazole (PROTONIX) 40 MG tablet Take 1 tablet (40 mg total) 2 (two) times daily by mouth.  . SPIRIVA HANDIHALER 18 MCG inhalation capsule INHALE ONCE DAILY  . triamcinolone ([facility] NASAL ALLERGY SPRAY) 55 MCG/ACT AERO nasal inhaler Place 2 sprays into the nose daily as needed (for allergies).  . Vitamin D, Ergocalciferol, (DRISDOL) 50000 units CAPS capsule Take 50,000 Units by mouth every Friday.   . [DISCONTINUED] LORazepam (ATIVAN) 0.5 MG tablet Take 1 tablet (0.5 mg  total) by mouth every 8 (eight) hours as needed for anxiety.  . [DISCONTINUED] losartan (COZAAR) 25 MG tablet   . [DISCONTINUED] meloxicam (MOBIC) 7.5 MG tablet Take 7.5 mg by mouth daily.  Marland Kitchen tiZANidine (ZANAFLEX) 4 MG tablet Take 1 tablet (4 mg total) by mouth at bedtime as needed for muscle spasms.  . [EXPIRED] cyanocobalamin ((VITAMIN B-12)) injection 1,000 mcg    No facility-administered encounter medications on file as of 05/03/2017.     Review of Systems  Constitutional: Negative for appetite change and unexpected weight change.  HENT: Negative for  congestion and sinus pressure.   Eyes: Negative for pain and visual disturbance.  Respiratory: Negative for cough, chest tightness and shortness of breath.   Cardiovascular: Positive for chest pain. Negative for palpitations.       Swelling improved.   Gastrointestinal: Negative for abdominal pain, diarrhea, nausea and vomiting.  Genitourinary: Negative for difficulty urinating and dysuria.  Musculoskeletal: Positive for back pain. Negative for joint swelling.  Skin: Negative for color change and rash.  Neurological: Negative for dizziness, light-headedness and headaches.  Hematological: Negative for adenopathy. Does not bruise/bleed easily.  Psychiatric/Behavioral: Negative for agitation and dysphoric mood.       Objective:    Physical Exam  Constitutional: She is oriented to person, place, and time. She appears well-developed and well-nourished. No distress.  HENT:  Nose: Nose normal.  Mouth/Throat: Oropharynx is clear and moist.  Eyes: Right eye exhibits no discharge. Left eye exhibits no discharge. No scleral icterus.  Neck: Neck supple. No thyromegaly present.  Cardiovascular: Normal rate and regular rhythm.  Pulmonary/Chest: Breath sounds normal. No accessory muscle usage. No tachypnea. No respiratory distress. She has no decreased breath sounds. She has no wheezes. She has no rhonchi. Right breast exhibits no inverted  nipple, no mass, no nipple discharge and no tenderness (no axillary adenopathy). Left breast exhibits no inverted nipple, no mass, no nipple discharge and no tenderness (no axilarry adenopathy).  Abdominal: Soft. Bowel sounds are normal. There is no tenderness.  Musculoskeletal: She exhibits no edema or tenderness.  No pain to palpation over her chest or back.    Lymphadenopathy:    She has no cervical adenopathy.  Neurological: She is alert and oriented to person, place, and time.  Skin: Skin is warm. No rash noted. No erythema.  Psychiatric: She has a normal mood and affect. Her behavior is normal.    BP 118/68 (BP Location: Left Arm, Patient Position: Sitting, Cuff Size: Normal)   Pulse 68   Temp (!) 97.4 F (36.3 C) (Oral)   Ht _0  (1.651 m)   Wt 128 lb (58.1 kg)   SpO2 98%   BMI 21.30 kg/m  Wt Readings from Last 3 Encounters:  05/03/17 128 lb (58.1 kg)  03/26/17 128 lb (58.1 kg)  03/24/17 126 lb 8 oz (57.4 kg)     Lab Results  Component Value Date   WBC 5.8 05/03/2017   HGB 14.6 05/03/2017   HCT 44.2 05/03/2017   PLT 230.0 05/03/2017   GLUCOSE 88 05/03/2017   CHOL 128 05/03/2017   TRIG 110.0 05/03/2017   HDL 30.70 (L) 05/03/2017   LDLCALC 75 05/03/2017   ALT 16 05/03/2017   AST 46 (H) 05/03/2017   NA 139 05/03/2017   K 4.0 05/03/2017   CL 101 05/03/2017   CREATININE 1.29 (H) 05/03/2017   BUN 17 05/03/2017   CO2 29 05/03/2017   TSH 3.39 10/13/2016   INR 1.10 11/24/2015   HGBA1C 5.0 03/26/2016       Assessment & Plan:   Problem List Items Addressed This Visit    Anemia   Relevant Medications   cyanocobalamin ((VITAMIN B-12)) injection 1,000 mcg (Completed)   Other Relevant Orders   CBC with Differential/Platelet (Completed)   Ferritin (Completed)   Vitamin B12 (Completed)   CAD (coronary artery disease)   Carotid arterial disease ([place])    Followed by cardiology.  Also seeing [facility] now.       Chest pain    Anterior chest pain and pain that goes  through to her back - since fall.  See acute care note.  Will xray.  Tizanidine as directed.  tyelnol during the day.  Follow.        Relevant Orders   DG Chest 2 View (Completed)   CKD (chronic kidney disease), stage III ([place])    Followed by nephrology.  S/p recent right renal artery stent placement.  Follow metabolic panel.        CKD (chronic kidney disease), stage IV ([facility]) - Primary   Constipation    Bowels doing better.  Follow.       COPD (chronic obstructive pulmonary disease) ([facility])    Breathing stable.  Follow.  Continue current inhalers.        Essential hypertension    Blood pressure under good control.  Continue same medication regimen.  Follow pressures.  Follow metabolic panel.        Gastroesophageal reflux disease    Controlled on protonix.  Follow.        Health care maintenance    Physical today 05/03/17.        Hyperlipidemia   Relevant Orders   Hepatic function panel (Completed)   Lipid panel (Completed)   Nonischemic cardiomyopathy ([place])    Followed by cardiology.  Stable.       Pulmonary hypertension ([place])    Followed by cardiology.       Renal artery stenosis ([facility])   Relevant Orders   Basic metabolic panel (Completed)   Smoking    Discussed again with her the need to quit.  Follow.        Other Visit Diagnoses    Midline thoracic back pain, unspecified chronicity       Relevant Medications   tiZANidine (ZANAFLEX) 4 MG tablet       Einar Pheasant, MD

## 2017-05-03 NOTE — Assessment & Plan Note (Signed)
Physical today 05/03/17.   

## 2017-05-04 ENCOUNTER — Other Ambulatory Visit: Payer: Self-pay | Admitting: Internal Medicine

## 2017-05-04 DIAGNOSIS — R7989 Other specified abnormal findings of blood chemistry: Secondary | ICD-10-CM

## 2017-05-04 DIAGNOSIS — R945 Abnormal results of liver function studies: Secondary | ICD-10-CM

## 2017-05-04 NOTE — Progress Notes (Signed)
Order placed for f/u lab.   

## 2017-05-06 ENCOUNTER — Encounter: Payer: Self-pay | Admitting: Internal Medicine

## 2017-05-06 DIAGNOSIS — R079 Chest pain, unspecified: Secondary | ICD-10-CM | POA: Insufficient documentation

## 2017-05-06 NOTE — Assessment & Plan Note (Signed)
Blood pressure under good control.  Continue same medication regimen.  Follow pressures.  Follow metabolic panel.   

## 2017-05-06 NOTE — Assessment & Plan Note (Signed)
Anterior chest pain and pain that goes through to her back - since fall.  See acute care note.  Will xray.  Tizanidine as directed.  tyelnol during the day.  Follow.

## 2017-05-06 NOTE — Assessment & Plan Note (Signed)
Bowels doing better.  Follow.   

## 2017-05-06 NOTE — Assessment & Plan Note (Signed)
Followed by cardiology. Stable.   

## 2017-05-06 NOTE — Assessment & Plan Note (Signed)
Discussed again with her the need to quit.  Follow.

## 2017-05-06 NOTE — Assessment & Plan Note (Signed)
Controlled on protonix.  Follow.   

## 2017-05-06 NOTE — Assessment & Plan Note (Signed)
Breathing stable.  Follow.  Continue current inhalers.

## 2017-05-06 NOTE — Assessment & Plan Note (Signed)
Followed by cardiology 

## 2017-05-06 NOTE — Assessment & Plan Note (Signed)
Followed by nephrology.  S/p recent right renal artery stent placement.  Follow metabolic panel.

## 2017-05-06 NOTE — Assessment & Plan Note (Signed)
Followed by cardiology.  Also seeing [facility] now.

## 2017-05-13 ENCOUNTER — Other Ambulatory Visit: Payer: Self-pay | Admitting: *Deleted

## 2017-05-13 MED ORDER — PANTOPRAZOLE SODIUM 40 MG PO TBEC: 40.0000 mg | DELAYED_RELEASE_TABLET | Freq: Two times a day (BID) | ORAL | 1 refills | Status: DC

## 2017-05-13 NOTE — Telephone Encounter (Signed)
Rx sent to pharmacy   

## 2017-05-17 ENCOUNTER — Other Ambulatory Visit: Payer: Self-pay | Admitting: Cardiovascular Disease

## 2017-05-19 ENCOUNTER — Telehealth: Payer: Self-pay | Admitting: Internal Medicine

## 2017-05-19 NOTE — Telephone Encounter (Signed)
[facility] is requesting a 90-day supply for Tizanidine 4mg  tabs Last filled on 05/03/17 #20 Please advise if okay to refill as 90-supply

## 2017-05-20 MED ORDER — TIZANIDINE HCL 4 MG PO TABS: 4.0000 mg | ORAL_TABLET | Freq: Every evening | ORAL | 1 refills | Status: DC | PRN

## 2017-05-20 NOTE — Telephone Encounter (Signed)
I ok'd refill for tizanidine #30 with one refill.  Was given for acute problem.  Hold on refilling for 90 day supply.

## 2017-05-20 NOTE — Telephone Encounter (Signed)
Noted. Nothing further needed. 

## 2017-05-24 ENCOUNTER — Encounter: Payer: Self-pay | Admitting: Internal Medicine

## 2017-05-24 ENCOUNTER — Other Ambulatory Visit (INDEPENDENT_AMBULATORY_CARE_PROVIDER_SITE_OTHER): Payer: Medicare HMO

## 2017-05-24 DIAGNOSIS — R945 Abnormal results of liver function studies: Secondary | ICD-10-CM

## 2017-05-24 DIAGNOSIS — R7989 Other specified abnormal findings of blood chemistry: Secondary | ICD-10-CM

## 2017-05-24 LAB — HEPATIC FUNCTION PANEL
ALT: 8 U/L (ref 0–35)
AST: 14 U/L (ref 0–37)
Albumin: 3.9 g/dL (ref 3.5–5.2)
Alkaline Phosphatase: 90 U/L (ref 39–117)
BILIRUBIN TOTAL: 1.2 mg/dL (ref 0.2–1.2)
Bilirubin, Direct: 0.3 mg/dL (ref 0.0–0.3)
TOTAL PROTEIN: 6.6 g/dL (ref 6.0–8.3)

## 2017-06-07 ENCOUNTER — Encounter: Payer: Self-pay | Admitting: Internal Medicine

## 2017-06-07 DIAGNOSIS — M546 Pain in thoracic spine: Secondary | ICD-10-CM

## 2017-06-07 NOTE — Telephone Encounter (Signed)
Looks like from note 05/03/17 that this is on going?

## 2017-06-07 NOTE — Telephone Encounter (Signed)
I have placed the order for the referral.  Please call and let pt know and also confirm doing ok.  Thanks

## 2017-06-09 ENCOUNTER — Telehealth: Payer: Self-pay | Admitting: Internal Medicine

## 2017-06-09 NOTE — Telephone Encounter (Signed)
Please call pt and and see how often she is taking the muscle relaxer.  Should have a refill at the pharmacy.  If some days she is needing twice a day, then ok to refill early x 1, but needs to keep the f/u with ortho.   I will send a note to Melissa to see if we can get an appt soon with ortho.  If increasing pain, needs evaluation.  If acute - can also go to walkin at [facility].

## 2017-06-09 NOTE — Telephone Encounter (Signed)
Left message for patient to call back  

## 2017-06-09 NOTE — Telephone Encounter (Signed)
See my previous note.  The part about the nausea and vomiting was not on the note when I first received.  If she is having increased pain, nausea and vomiting, she needs to go be seen today.  Let her know that I am not in the office, but do recommend evaluation today with these symptoms.  Thanks.

## 2017-06-09 NOTE — Telephone Encounter (Signed)
Copied from [place] [phone]. Topic: Referral - Request >> Jun 09, 2017  1:55 PM Hewitt Shorts wrote: Reason for CRM: pt is requesting a referral to Marylee Floras at [facility] and this office will refer her to ortho  Best number [phone]

## 2017-06-09 NOTE — Telephone Encounter (Unsigned)
Copied from [place] [id number]. Topic: Referral - Request >> Jun 09, 2017  7:55 AM Hewitt Shorts wrote: Reason for CRM: pt states that Dr. Carlis Abbott knows about her fall and that if she was still sore that pt can call and she would refer to an orthopedic office but pt is requesting that a referral to Marylee Floras and then this provider would schedule the orthopedic referral  Best number [phone]

## 2017-06-09 NOTE — Telephone Encounter (Signed)
Tried to reach patient by phone line busy no voicemail.

## 2017-06-10 ENCOUNTER — Ambulatory Visit (INDEPENDENT_AMBULATORY_CARE_PROVIDER_SITE_OTHER): Payer: Medicare HMO

## 2017-06-10 DIAGNOSIS — E538 Deficiency of other specified B group vitamins: Secondary | ICD-10-CM

## 2017-06-10 MED ORDER — CYANOCOBALAMIN 1000 MCG/ML IJ SOLN
1000.0000 ug | Freq: Once | INTRAMUSCULAR | Status: AC
  Administered 2017-06-10: 1000 ug via INTRAMUSCULAR

## 2017-06-10 NOTE — Progress Notes (Addendum)
Patient comes in today for a vitamin B 12 injection. Administered B 12 injection in Right Deltoid. Patient tolerated well.  Reviewed.  Dr Nicki Reaper

## 2017-06-11 NOTE — Telephone Encounter (Signed)
Patient in for B 12 injection no longer nausea or vomiting just requesting referral.

## 2017-06-16 ENCOUNTER — Other Ambulatory Visit: Payer: Self-pay | Admitting: Internal Medicine

## 2017-06-24 DIAGNOSIS — E1122 Type 2 diabetes mellitus with diabetic chronic kidney disease: Secondary | ICD-10-CM | POA: Diagnosis not present

## 2017-06-24 DIAGNOSIS — I129 Hypertensive chronic kidney disease with stage 1 through stage 4 chronic kidney disease, or unspecified chronic kidney disease: Secondary | ICD-10-CM | POA: Diagnosis not present

## 2017-06-24 DIAGNOSIS — N2581 Secondary hyperparathyroidism of renal origin: Secondary | ICD-10-CM | POA: Diagnosis not present

## 2017-06-24 DIAGNOSIS — I701 Atherosclerosis of renal artery: Secondary | ICD-10-CM | POA: Diagnosis not present

## 2017-06-24 DIAGNOSIS — N183 Chronic kidney disease, stage 3 (moderate): Secondary | ICD-10-CM | POA: Diagnosis not present

## 2017-06-25 ENCOUNTER — Other Ambulatory Visit (INDEPENDENT_AMBULATORY_CARE_PROVIDER_SITE_OTHER): Payer: Medicare HMO

## 2017-06-25 ENCOUNTER — Ambulatory Visit (INDEPENDENT_AMBULATORY_CARE_PROVIDER_SITE_OTHER): Payer: Medicare HMO | Admitting: Vascular Surgery

## 2017-06-25 ENCOUNTER — Encounter (INDEPENDENT_AMBULATORY_CARE_PROVIDER_SITE_OTHER): Payer: Self-pay | Admitting: Vascular Surgery

## 2017-06-25 ENCOUNTER — Other Ambulatory Visit (INDEPENDENT_AMBULATORY_CARE_PROVIDER_SITE_OTHER): Payer: Self-pay | Admitting: Vascular Surgery

## 2017-06-25 VITALS — BP 118/63 | HR 74 | Resp 16 | Ht 65.0 in | Wt 127.0 lb

## 2017-06-25 DIAGNOSIS — I701 Atherosclerosis of renal artery: Secondary | ICD-10-CM

## 2017-06-25 DIAGNOSIS — J432 Centrilobular emphysema: Secondary | ICD-10-CM | POA: Diagnosis not present

## 2017-06-25 DIAGNOSIS — I150 Renovascular hypertension: Secondary | ICD-10-CM | POA: Diagnosis not present

## 2017-06-25 DIAGNOSIS — I25118 Atherosclerotic heart disease of native coronary artery with other forms of angina pectoris: Secondary | ICD-10-CM | POA: Diagnosis not present

## 2017-06-25 NOTE — Assessment & Plan Note (Signed)
Her duplex today shows a widely patent renal artery stent with no evidence of restenosis in her right renal artery.  Her left renal artery is chronically occluded. Her clinical improvement has been significant.  No changes at this time.  Continue current medical regimen.  Recheck in 6 months.

## 2017-06-25 NOTE — Assessment & Plan Note (Signed)
Control improved.

## 2017-06-25 NOTE — Progress Notes (Signed)
MRN : [id number]  Sandra Ellison is a 72 y.o. (04-23-1946) female who presents with chief complaint of  Chief Complaint  Patient presents with  . Follow-up    3 month renal f/u  .  History of Present Illness: Patient returns today in follow up of her renal artery stenosis.  After her renal artery stent placement for high-grade stenosis in a solitary kidney several months ago, she has noticed improvement in her renal function and her blood pressure control.  She feels well.  She is not retaining fluid.  Her duplex today shows a widely patent renal artery stent with no evidence of restenosis in her right renal artery.  Her left renal artery is chronically occluded.  Current Outpatient Medications  Medication Sig Dispense Refill  . albuterol (PROVENTIL) (2.5 MG/3ML) 0.083% nebulizer solution USE ONE TREATMENT EVERY 6 HOURS AS NEEDED (Patient taking differently: USE ONE TREATMENT EVERY 6 HOURS AS NEEDED FOR WHEEZING/SHORTNESS OF BREATH) 180 mL 1  . albuterol (VENTOLIN HFA) 108 (90 Base) MCG/ACT inhaler Inhale 2 puffs every 6 hours as needed 18 g 2  . aspirin EC 81 MG tablet Take 81 mg by mouth daily.    Marland Kitchen atorvastatin (LIPITOR) 40 MG tablet TAKE 1 TABLET BY MOUTH EVERY DAY (Patient taking differently: TAKE 1/2 TABLET BY MOUTH EVERY DAY) 90 tablet 3  . carvedilol (COREG) 6.25 MG tablet Take 1 tablet (6.25 mg total) by mouth 2 (two) times daily with a meal. 180 tablet 3  . citalopram (CELEXA) 10 MG tablet TAKE 1 TABLET (10 MG TOTAL) BY MOUTH DAILY IN THE EVENING. 90 tablet 0  . clopidogrel (PLAVIX) 75 MG tablet Take 1 tablet (75 mg total) by mouth daily. 90 tablet 3  . Cyanocobalamin 1000 MCG/ML KIT Inject 1 mL as directed every 30 (thirty) days.    . Fe Fum-FePoly-Vit C-Vit B3 (INTEGRA) 62.5-62.5-40-3 MG CAPS One per day (Patient taking differently: Take 1 capsule by mouth at bedtime. ) 30 capsule 2  . furosemide (LASIX) 20 MG tablet Take 2 tablets (40 mg total) by mouth 2 (two) times daily as  needed. 360 tablet 3  . gabapentin (NEURONTIN) 100 MG capsule Take 100 mg by mouth 3 (three) times daily.    Marland Kitchen gabapentin (NEURONTIN) 300 MG capsule Take 1 capsule (300 mg total) by mouth 3 (three) times daily. 270 capsule 1  . lisinopril (PRINIVIL,ZESTRIL) 5 MG tablet Take 5 mg by mouth daily.  3  . magnesium oxide (MAG-OX) 400 MG tablet TAKE 1 TABLET BY MOUTH EVERY DAY 30 tablet 0  . pantoprazole (PROTONIX) 40 MG tablet TAKE 1 TABLET BY MOUTH TWICE A DAY 90 tablet 0  . SPIRIVA HANDIHALER 18 MCG inhalation capsule INHALE ONCE DAILY 30 capsule 5  . tiZANidine (ZANAFLEX) 4 MG tablet Take 1 tablet (4 mg total) by mouth at bedtime as needed for muscle spasms. 30 tablet 1  . triamcinolone ([employer] NASAL ALLERGY SPRAY) 55 MCG/ACT AERO nasal inhaler Place 2 sprays into the nose daily as needed (for allergies).    . Vitamin D, Ergocalciferol, (DRISDOL) 50000 units CAPS capsule Take 50,000 Units by mouth every Friday.      No current facility-administered medications for this visit.     Past Medical History:  Diagnosis Date  . Allergy   . Arthritis   . Carotid arterial disease ([place])    a. 02/2015 Carotid U/S: RICA 84-69%, LICA 62-95%.  . Chronic combined systolic and diastolic CHF (congestive heart failure) ([place])  a. 01/2012 Echo: EF 30-35%, mod conc LVH, sev inf HK, mildly dil LA, mild-mod MR, Trace TR, RVSP 40-79mHg.    .Marland KitchenChronic kidney disease (CKD), stage II (mild)   . COPD (chronic obstructive pulmonary disease) ([facility]   . Dilated cardiomyopathy ([place]    a. 02/2011 MV: EF 27%, anteroseptal, inf, inferolateral scar, no ischemia.  .Marland KitchenDyspnea   . GERD (gastroesophageal reflux disease)   . History of abnormal Pap smear    a. s/p vaginal hysterectomy  . History of diverticulosis   . Hx of colonic polyps   . Hyperkalemia    a. 02/2015 in setting of entresto/AKI->entresto d/c'd.  . Hypertension   . Iron deficiency anemia    a. h/o transfusions, prev followed by heme/onc.  . Pulmonary HTN ([place]     . Stroke ([facility]   . TIA (transient ischemic attack)   . Tobacco abuse     Past Surgical History:  Procedure Laterality Date  . CARDIAC CATHETERIZATION Left 08/05/2015   Procedure: Left Heart Cath and Coronary Angiography;  Surgeon: TMinna Merritts MD;  [facility];  Service: Cardiovascular;  Laterality: Left;  . COLONOSCOPY  10/2010  . COLONOSCOPY WITH PROPOFOL N/A 12/15/2016   Procedure: COLONOSCOPY WITH PROPOFOL;  Surgeon: BRobert Bellow MD;  Location: [facility];  Service: Endoscopy;  Laterality: N/A;  . ESOPHAGOGASTRODUODENOSCOPY (EGD) WITH PROPOFOL N/A 12/15/2016   Procedure: ESOPHAGOGASTRODUODENOSCOPY (EGD) WITH PROPOFOL;  Surgeon: BRobert Bellow MD;  Location: [facility];  Service: Endoscopy;  Laterality: N/A;  . ORIF DISTAL RADIUS FRACTURE  2011   right  . RENAL ANGIOGRAPHY Right 02/17/2017   Procedure: Renal Angiography;  Surgeon: DAlgernon Huxley MD;  [facility];  Service: Cardiovascular;  Laterality: Right;  . TONSILLECTOMY  age 72 . VAGINAL HYSTERECTOMY  1981   secondary to abnormal pap smear    Social History Social History   Tobacco Use  . Smoking status: Current Every Day Smoker    Packs/day: 0.50    Years: 42.00    Pack years: 21.00    Types: Cigarettes  . Smokeless tobacco: Never Used  . Tobacco comment: Down to 3 cigarettes per day  Substance Use Topics  . Alcohol use: No    Alcohol/week: 0.0 oz  . Drug use: No     Family History Family History  Problem Relation Age of Onset  . Arthritis Mother   . Alcohol abuse Father   . Arthritis Father   . Cancer Father        throat/spine  . Breast cancer Neg Hx      Allergies  Allergen Reactions  . Ranitidine Hcl Anaphylaxis and Swelling  . Entresto [Sacubitril-Valsartan] Other (See Comments)    Potassium issues     REVIEW OF SYSTEMS (Negative unless checked)  Constitutional: [] Weight loss  [] Fever  [] Chills Cardiac: [] Chest pain   [] Chest  pressure   [] Palpitations   [] Shortness of breath when laying flat   [x] Shortness of breath at rest   [x] Shortness of breath with exertion. Vascular:  [] Pain in legs with walking   [] Pain in legs at rest   [] Pain in legs when laying flat   [] Claudication   [] Pain in feet when walking  [] Pain in feet at rest  [] Pain in feet when laying flat   [] History of DVT   [] Phlebitis   [] Swelling in legs   [] Varicose veins   [] Non-healing ulcers Pulmonary:   [] Uses home oxygen   [x] Productive  cough   [] Hemoptysis   [] Wheeze  [x] COPD   [] Asthma Neurologic:  [] Dizziness  [] Blackouts   [] Seizures   [] History of stroke   [] History of TIA  [] Aphasia   [] Temporary blindness   [] Dysphagia   [] Weakness or numbness in arms   [] Weakness or numbness in legs Musculoskeletal:  [x] Arthritis   [] Joint swelling   [] Joint pain   [] Low back pain Hematologic:  [] Easy bruising  [] Easy bleeding   [] Hypercoagulable state   [] Anemic  [] Hepatitis Gastrointestinal:  [] Blood in stool   [] Vomiting blood  [x] Gastroesophageal reflux/heartburn   [] Abdominal pain Genitourinary:  [] Chronic kidney disease   [] Difficult urination  [] Frequent urination  [] Burning with urination   [] Hematuria Skin:  [] Rashes   [] Ulcers   [] Wounds Psychological:  [] History of anxiety   []  History of major depression.    Physical Examination  BP 118/63 (BP Location: Right Arm, Patient Position: Sitting)   Pulse 74   Resp 16   Ht 5' 5"  (1.651 m)   Wt 57.6 kg (127 lb)   BMI 21.13 kg/m  Gen:  WD/WN, NAD.  Appears younger than stated age Head: [place]/AT, No temporalis wasting. Ear/Nose/Throat: Hearing grossly intact, nares w/o erythema or drainage, trachea midline Eyes: Conjunctiva clear. Sclera non-icteric Neck: Supple.  No JVD.  Pulmonary:  Good air movement, no use of accessory muscles.  Cardiac: RRR, normal S1, S2 Vascular:  Vessel Right Left  Radial Palpable Palpable                                    Musculoskeletal: M/S 5/5 throughout.  No  deformity or atrophy.  No significant lower extremity edema. Neurologic: Sensation grossly intact in extremities.  Symmetrical.  Speech is fluent.  Psychiatric: Judgment intact, Mood & affect appropriate for pt's clinical situation. Dermatologic: No rashes or ulcers noted.  No cellulitis or open wounds.       Labs Recent Results (from the past 2160 hour(s))  CBC with Differential/Platelet     Status: Abnormal   Collection Time: 05/03/17  2:20 PM  Result Value Ref Range   WBC 5.8 4.0 - 10.5 K/uL   RBC 4.51 3.87 - 5.11 Mil/uL   Hemoglobin 14.6 12.0 - 15.0 g/dL   HCT 44.2 36.0 - 46.0 %   MCV 98.0 78.0 - 100.0 fl   MCHC 33.0 30.0 - 36.0 g/dL   RDW 16.1 (H) 11.5 - 15.5 %   Platelets 230.0 150.0 - 400.0 K/uL   Neutrophils Relative % 72.2 43.0 - 77.0 %   Lymphocytes Relative 16.6 12.0 - 46.0 %   Monocytes Relative 9.3 3.0 - 12.0 %   Eosinophils Relative 1.2 0.0 - 5.0 %   Basophils Relative 0.7 0.0 - 3.0 %   Neutro Abs 4.2 1.4 - 7.7 K/uL   Lymphs Abs 1.0 0.7 - 4.0 K/uL   Monocytes Absolute 0.5 0.1 - 1.0 K/uL   Eosinophils Absolute 0.1 0.0 - 0.7 K/uL   Basophils Absolute 0.0 0.0 - 0.1 K/uL  Ferritin     Status: None   Collection Time: 05/03/17  2:20 PM  Result Value Ref Range   Ferritin 101.5 10.0 - 291.0 ng/mL  Hepatic function panel     Status: Abnormal   Collection Time: 05/03/17  2:20 PM  Result Value Ref Range   Total Bilirubin 1.2 0.2 - 1.2 mg/dL   Bilirubin, Direct 0.3 0.0 - 0.3 mg/dL   Alkaline Phosphatase  79 39 - 117 U/L   AST 46 (H) 0 - 37 U/L   ALT 16 0 - 35 U/L   Total Protein 6.8 6.0 - 8.3 g/dL   Albumin 4.0 3.5 - 5.2 g/dL  Lipid panel     Status: Abnormal   Collection Time: 05/03/17  2:20 PM  Result Value Ref Range   Cholesterol 128 0 - 200 mg/dL    Comment: ATP III Classification       Desirable:  < 200 mg/dL               Borderline High:  200 - 239 mg/dL          High:  > = 240 mg/dL   Triglycerides 110.0 0.0 - 149.0 mg/dL    Comment: Normal:  <150  mg/dLBorderline High:  150 - 199 mg/dL   HDL 30.70 (L) >39.00 mg/dL   VLDL 22.0 0.0 - 40.0 mg/dL   LDL Cholesterol 75 0 - 99 mg/dL   Total CHOL/HDL Ratio 4     Comment:                Men          Women1/2 Average Risk     3.4          3.3Average Risk          5.0          4.42X Average Risk          9.6          7.13X Average Risk          15.0          11.0                       NonHDL 96.97     Comment: NOTE:  Non-HDL goal should be 30 mg/dL higher than patient's LDL goal (i.e. LDL goal of < 70 mg/dL, would have non-HDL goal of < 100 mg/dL)  Basic metabolic panel     Status: Abnormal   Collection Time: 05/03/17  2:20 PM  Result Value Ref Range   Sodium 139 135 - 145 mEq/L   Potassium 4.0 3.5 - 5.1 mEq/L   Chloride 101 96 - 112 mEq/L   CO2 29 19 - 32 mEq/L   Glucose, Bld 88 70 - 99 mg/dL   BUN 17 6 - 23 mg/dL   Creatinine, Ser 1.29 (H) 0.40 - 1.20 mg/dL   Calcium 9.4 8.4 - 10.5 mg/dL   GFR 43.30 (L) >60.00 mL/min  Vitamin B12     Status: None   Collection Time: 05/03/17  2:20 PM  Result Value Ref Range   Vitamin B-12 603 211 - 911 pg/mL  Hepatic function panel     Status: None   Collection Time: 05/24/17  2:07 PM  Result Value Ref Range   Total Bilirubin 1.2 0.2 - 1.2 mg/dL   Bilirubin, Direct 0.3 0.0 - 0.3 mg/dL   Alkaline Phosphatase 90 39 - 117 U/L   AST 14 0 - 37 U/L   ALT 8 0 - 35 U/L   Total Protein 6.6 6.0 - 8.3 g/dL   Albumin 3.9 3.5 - 5.2 g/dL    Radiology No results found.   Assessment/Plan COPD (chronic obstructive pulmonary disease) We'll need to monitor closely with her procedure as she has had hypoxia issues in the past. Continue her steroid inhalers and bronchodilators.  CKD (chronic kidney disease),  stage IV (HCC) Her last GFR that I see after the procedure was 43.  This is a significant improvement from the 30 that it was preprocedure.  She has borderline stage III to stage IV chronic kidney disease but hopefully her increased perfusion will keep this  stable.   Renovascular hypertension Control improved.  Renal artery stenosis (HCC) Her duplex today shows a widely patent renal artery stent with no evidence of restenosis in her right renal artery.  Her left renal artery is chronically occluded. Her clinical improvement has been significant.  No changes at this time.  Continue current medical regimen.  Recheck in 6 months.    Leotis Pain, MD  06/25/2017 11:49 AM    This note was created with Dragon medical transcription system.  Any errors from dictation are purely unintentional

## 2017-06-25 NOTE — Patient Instructions (Signed)
Renal Artery Stenosis Renal artery stenosis (RAS) is narrowing of the artery that carries blood to your kidneys. It can affect one or both kidneys. Your kidneys filter waste and extra fluid from your blood. You get rid of the waste and fluid when you urinate. Your kidneys also make an important chemical messenger (hormone) called renin. Renin helps regulate your blood pressure. The first sign of RAS may be high blood pressure. Over time, other symptoms can develop. What are the causes? Plaque buildup in your arteries (atherosclerosis) is the main cause of RAS. The plaques that cause this are made up of:  Fat.  Cholesterol.  Calcium.  Other substances.  As these substances build up in your renal artery, this slows the blood supply to your kidneys. The lack of blood and oxygen causes the signs and symptoms of RAS. A much less common cause of RAS is a disease called fibromuscular dysplasia. This disease causes abnormal cell growth that narrows the renal artery. It is not related to atherosclerosis. It occurs mostly in women who are 25-50 years old. It may be passed down through families. What increases the risk? You may be at risk for renal artery stenosis if you:  Are a man who is at least 72 years old.  Are a woman who is at least 72 years old.  Have high blood pressure.  Have high cholesterol.  Are a smoker.  Abuse alcohol.  Have diabetes or prediabetes.  Are overweight.  Have a family history of early heart disease.  What are the signs or symptoms? RAS usually develops slowly. You may not have any signs or symptoms at first. The earliest signs may be:  Developing high blood pressure.  A sudden increase in existing high blood pressure.  No longer responding to medicine that used to control your blood pressure.  Later signs and symptoms are due to kidney damage. They may include:  Fatigue.  Shortness of breath.  Swollen legs and feet.  Dry  skin.  Headaches.  Muscle cramps.  Loss of appetite.  Nausea or vomiting.  How is this diagnosed? Your health care provider may suspect RAS based on changes in your blood pressure and your risk factors. A physical exam will be done. Your health care provider may use a stethoscope to listen for a whooshing sound (bruit) that can occur where the renal artery is blocking blood flow. Several tests may be done to confirm a diagnosis of RAS. These may include:  Blood and urine tests to check your kidney function.  Imaging tests of your kidneys, such as: ? A test that involves using sound waves to create an image of your kidneys and the blood flow to your kidneys (ultrasound). ? A test in which dye is injected into one of your blood vessels so images can be taken as the dye flows through your renal arteries (angiogram). These tests can be done using X-rays, a CT scan (computed tomography angiogram, CTA), or a type of MRI (magnetic resonance angiogram, MRA).  How is this treated? Making lifestyle changes to reduce your risk factors is the first treatment option for early RAS. If the blood flow to one of your kidneys is cut by more than half, you may need medicine to:  Lower your blood pressure. This is the main medical treatment for RAS. You may need more than one type of medicine for this. The two types that work best for RAS are: ? ACE inhibitors. ? Angiotensin receptor blockers.  Reduce fluid   in the body (diuretics).  Lower your cholesterol (statins).  If medicine is not enough to control RAS, you may need surgery. This may involve:  Threading a tube with an inflatable balloon into the renal artery to force it open (angioplasty).  Removing plaque from inside the artery (endarterectomy).  Follow these instructions at home:  Take medicines only as directed by your health care provider.  Make any lifestyle changes recommended by your health care provider. This may include: ? Working  with a dietitian to maintain a heart-healthy diet. This type of diet is low in saturated fat, salt, and added sugar. ? Starting an exercise program as directed by your health care provider. ? Maintaining a healthy weight. ? Quitting smoking. ? Not abusing alcohol.  Keep all follow-up visits as directed by your health care provider. This is important. Contact a health care provider if:  Your symptoms of RAS are not getting better.  Your symptoms are changing or getting worse. Get help right away if:  You have very bad pain in your back or abdomen.  You have blood in your urine. This information is not intended to replace advice given to you by your health care provider. Make sure you discuss any questions you have with your health care provider. Document Released: 02/18/2005 Document Revised: 10/31/2015 Document Reviewed: 09/07/2013 Elsevier Interactive Patient Education  2018 [employer]  

## 2017-06-29 ENCOUNTER — Telehealth: Payer: Self-pay

## 2017-06-29 DIAGNOSIS — M4694 Unspecified inflammatory spondylopathy, thoracic region: Secondary | ICD-10-CM | POA: Diagnosis not present

## 2017-06-29 DIAGNOSIS — M546 Pain in thoracic spine: Secondary | ICD-10-CM | POA: Diagnosis not present

## 2017-06-29 NOTE — Telephone Encounter (Signed)
FYI

## 2017-06-29 NOTE — Telephone Encounter (Signed)
Noted.  Keep [place] posted and let us know if she needs anything.

## 2017-06-29 NOTE — Telephone Encounter (Signed)
Copied from [place]. Topic: Inquiry >> Jun 29, 2017  3:10 PM Conception Chancy, NT wrote: Patient is calling and would like to leave a message for Dr. Nicki Reaper -- she said she seen her orthopedic Kerby Nora today and he gave her a 12 day supply of steroids for facet.

## 2017-07-08 ENCOUNTER — Encounter: Payer: Self-pay | Admitting: Internal Medicine

## 2017-07-08 ENCOUNTER — Ambulatory Visit (INDEPENDENT_AMBULATORY_CARE_PROVIDER_SITE_OTHER): Payer: Medicare HMO | Admitting: Internal Medicine

## 2017-07-08 VITALS — BP 110/70 | HR 68 | Temp 97.6°F | Resp 18 | Wt 134.6 lb

## 2017-07-08 DIAGNOSIS — E782 Mixed hyperlipidemia: Secondary | ICD-10-CM | POA: Diagnosis not present

## 2017-07-08 DIAGNOSIS — N183 Chronic kidney disease, stage 3 unspecified: Secondary | ICD-10-CM

## 2017-07-08 DIAGNOSIS — N184 Chronic kidney disease, stage 4 (severe): Secondary | ICD-10-CM | POA: Diagnosis not present

## 2017-07-08 DIAGNOSIS — F419 Anxiety disorder, unspecified: Secondary | ICD-10-CM

## 2017-07-08 DIAGNOSIS — R2681 Unsteadiness on feet: Secondary | ICD-10-CM

## 2017-07-08 DIAGNOSIS — D649 Anemia, unspecified: Secondary | ICD-10-CM | POA: Diagnosis not present

## 2017-07-08 DIAGNOSIS — I779 Disorder of arteries and arterioles, unspecified: Secondary | ICD-10-CM

## 2017-07-08 DIAGNOSIS — J432 Centrilobular emphysema: Secondary | ICD-10-CM | POA: Diagnosis not present

## 2017-07-08 DIAGNOSIS — I5022 Chronic systolic (congestive) heart failure: Secondary | ICD-10-CM | POA: Diagnosis not present

## 2017-07-08 DIAGNOSIS — I739 Peripheral vascular disease, unspecified: Secondary | ICD-10-CM

## 2017-07-08 DIAGNOSIS — I255 Ischemic cardiomyopathy: Secondary | ICD-10-CM

## 2017-07-08 DIAGNOSIS — I25118 Atherosclerotic heart disease of native coronary artery with other forms of angina pectoris: Secondary | ICD-10-CM

## 2017-07-08 DIAGNOSIS — I420 Dilated cardiomyopathy: Secondary | ICD-10-CM | POA: Diagnosis not present

## 2017-07-08 DIAGNOSIS — I10 Essential (primary) hypertension: Secondary | ICD-10-CM

## 2017-07-08 DIAGNOSIS — F17200 Nicotine dependence, unspecified, uncomplicated: Secondary | ICD-10-CM

## 2017-07-08 DIAGNOSIS — K219 Gastro-esophageal reflux disease without esophagitis: Secondary | ICD-10-CM

## 2017-07-08 DIAGNOSIS — I272 Pulmonary hypertension, unspecified: Secondary | ICD-10-CM

## 2017-07-08 DIAGNOSIS — R0789 Other chest pain: Secondary | ICD-10-CM

## 2017-07-08 DIAGNOSIS — I701 Atherosclerosis of renal artery: Secondary | ICD-10-CM

## 2017-07-08 LAB — HEPATIC FUNCTION PANEL
ALT: 16 U/L (ref 0–35)
AST: 18 U/L (ref 0–37)
Albumin: 3.7 g/dL (ref 3.5–5.2)
Alkaline Phosphatase: 69 U/L (ref 39–117)
BILIRUBIN TOTAL: 0.8 mg/dL (ref 0.2–1.2)
Bilirubin, Direct: 0.1 mg/dL (ref 0.0–0.3)
Total Protein: 6.6 g/dL (ref 6.0–8.3)

## 2017-07-08 LAB — FERRITIN: FERRITIN: 75 ng/mL (ref 10.0–291.0)

## 2017-07-08 LAB — CBC WITH DIFFERENTIAL/PLATELET
BASOS ABS: 0 10*3/uL (ref 0.0–0.1)
Basophils Relative: 0.3 % (ref 0.0–3.0)
Eosinophils Absolute: 0.1 10*3/uL (ref 0.0–0.7)
Eosinophils Relative: 0.6 % (ref 0.0–5.0)
HEMATOCRIT: 45.7 % (ref 36.0–46.0)
HEMOGLOBIN: 15.3 g/dL — AB (ref 12.0–15.0)
LYMPHS PCT: 9.7 % — AB (ref 12.0–46.0)
Lymphs Abs: 0.9 10*3/uL (ref 0.7–4.0)
MCHC: 33.6 g/dL (ref 30.0–36.0)
MCV: 97.9 fl (ref 78.0–100.0)
Monocytes Absolute: 0.6 10*3/uL (ref 0.1–1.0)
Monocytes Relative: 7.3 % (ref 3.0–12.0)
NEUTROS PCT: 82.1 % — AB (ref 43.0–77.0)
Neutro Abs: 7.3 10*3/uL (ref 1.4–7.7)
Platelets: 272 10*3/uL (ref 150.0–400.0)
RBC: 4.66 Mil/uL (ref 3.87–5.11)
RDW: 14.2 % (ref 11.5–15.5)
WBC: 8.9 10*3/uL (ref 4.0–10.5)

## 2017-07-08 LAB — BASIC METABOLIC PANEL
BUN: 26 mg/dL — ABNORMAL HIGH (ref 6–23)
CO2: 30 meq/L (ref 19–32)
Calcium: 8.8 mg/dL (ref 8.4–10.5)
Chloride: 99 mEq/L (ref 96–112)
Creatinine, Ser: 1.1 mg/dL (ref 0.40–1.20)
GFR: 52.02 mL/min — AB (ref >60.00)
GLUCOSE: 102 mg/dL — AB (ref 70–99)
Potassium: 4.1 mEq/L (ref 3.5–5.1)
SODIUM: 138 meq/L (ref 135–145)

## 2017-07-08 NOTE — Progress Notes (Signed)
Patient ID: Sandra Ellison, female   DOB: May 16, 1946, 72 y.o.   MRN: [id number]   Subjective:    Patient ID: Sandra Ellison, female    DOB: [birth year], 72 y.o.   MRN: [id number]  HPI  Patient here for a scheduled follow up.  She has been having continue pain in the sternum radiating to the mid posterior thoracic region.  Has seen ortho.  Unable to take antiinflammatories.  Was placed on steroids.  Instructed to call back and then plan f/u with Dr Sharlet Salina.  Also discussed MRI thoracic spine.  She does not feel the prednisone changed the pain.  Discussed that she needs to contact ortho.  Muscle relaxer does help.  Request refill.  No chest pain. Breathing stable.  No acid reflux.  No abdominal pain.  Bowels moving.  Does report her walking is a little unsteady. Discussed therapy.     Past Medical History:  Diagnosis Date  . Allergy   . Arthritis   . Carotid arterial disease ([place])    a. 02/2015 Carotid U/S: RICA 84-16%, LICA 60-63%.  . Chronic combined systolic and diastolic CHF (congestive heart failure) ([place])    a. 01/2012 Echo: EF 30-35%, mod conc LVH, sev inf HK, mildly dil LA, mild-mod MR, Trace TR, RVSP 40-2mHg.    .Marland KitchenChronic kidney disease (CKD), stage II (mild)   . COPD (chronic obstructive pulmonary disease) ([place]   . Dilated cardiomyopathy ([place]    a. 02/2011 MV: EF 27%, anteroseptal, inf, inferolateral scar, no ischemia.  .Marland KitchenDyspnea   . GERD (gastroesophageal reflux disease)   . History of abnormal Pap smear    a. s/p vaginal hysterectomy  . History of diverticulosis   . Hx of colonic polyps   . Hyperkalemia    a. 02/2015 in setting of entresto/AKI->entresto d/c'd.  . Hypertension   . Iron deficiency anemia    a. h/o transfusions, prev followed by heme/onc.  . Pulmonary HTN ([facility]   . Stroke ([place]   . TIA (transient ischemic attack)   . Tobacco abuse    Past Surgical History:  Procedure Laterality Date  . CARDIAC CATHETERIZATION Left 08/05/2015   Procedure: Left Heart  Cath and Coronary Angiography;  Surgeon: TMinna Merritts MD;  [facility];  Service: Cardiovascular;  Laterality: Left;  . COLONOSCOPY  10/2010  . COLONOSCOPY WITH PROPOFOL N/A 12/15/2016   Procedure: COLONOSCOPY WITH PROPOFOL;  Surgeon: BRobert Bellow MD;  Location: [facility];  Service: Endoscopy;  Laterality: N/A;  . ESOPHAGOGASTRODUODENOSCOPY (EGD) WITH PROPOFOL N/A 12/15/2016   Procedure: ESOPHAGOGASTRODUODENOSCOPY (EGD) WITH PROPOFOL;  Surgeon: BRobert Bellow MD;  Location: [facility];  Service: Endoscopy;  Laterality: N/A;  . ORIF DISTAL RADIUS FRACTURE  2011   right  . RENAL ANGIOGRAPHY Right 02/17/2017   Procedure: Renal Angiography;  Surgeon: DAlgernon Huxley MD;  [facility];  Service: Cardiovascular;  Laterality: Right;  . TONSILLECTOMY  age 72 . VAGINAL HYSTERECTOMY  1981   secondary to abnormal pap smear   Family History  Problem Relation Age of Onset  . Arthritis Mother   . Alcohol abuse Father   . Arthritis Father   . Cancer Father        throat/spine  . Breast cancer Neg Hx    Social History   Socioeconomic History  . Marital status: Divorced    Spouse name: None  . Number of children: 1  . Years of education: None  .  Highest education level: None  Social Needs  . Financial resource strain: None  . Food insecurity - worry: None  . Food insecurity - inability: None  . Transportation needs - medical: None  . Transportation needs - non-medical: None  Occupational History  . None  Tobacco Use  . Smoking status: Current Every Day Smoker    Packs/day: 0.50    Years: 42.00    Pack years: 21.00    Types: Cigarettes  . Smokeless tobacco: Never Used  . Tobacco comment: Down to 3 cigarettes per day  Substance and Sexual Activity  . Alcohol use: No    Alcohol/week: 0.0 oz  . Drug use: No  . Sexual activity: No  Other Topics Concern  . None  Social History Narrative  . None    Outpatient Encounter  Medications as of 07/08/2017  Medication Sig  . albuterol (PROVENTIL) (2.5 MG/3ML) 0.083% nebulizer solution USE ONE TREATMENT EVERY 6 HOURS AS NEEDED (Patient taking differently: USE ONE TREATMENT EVERY 6 HOURS AS NEEDED FOR WHEEZING/SHORTNESS OF BREATH)  . albuterol (VENTOLIN HFA) 108 (90 Base) MCG/ACT inhaler Inhale 2 puffs every 6 hours as needed  . aspirin EC 81 MG tablet Take 81 mg by mouth daily.  Marland Kitchen atorvastatin (LIPITOR) 40 MG tablet TAKE 1 TABLET BY MOUTH EVERY DAY (Patient taking differently: TAKE 1/2 TABLET BY MOUTH EVERY DAY)  . carvedilol (COREG) 6.25 MG tablet Take 1 tablet (6.25 mg total) by mouth 2 (two) times daily with a meal.  . citalopram (CELEXA) 10 MG tablet TAKE 1 TABLET (10 MG TOTAL) BY MOUTH DAILY IN THE EVENING.  Marland Kitchen clopidogrel (PLAVIX) 75 MG tablet Take 1 tablet (75 mg total) by mouth daily.  . Cyanocobalamin 1000 MCG/ML KIT Inject 1 mL as directed every 30 (thirty) days.  . Fe Fum-FePoly-Vit C-Vit B3 (INTEGRA) 62.5-62.5-40-3 MG CAPS One per day (Patient taking differently: Take 1 capsule by mouth at bedtime. )  . furosemide (LASIX) 20 MG tablet Take 2 tablets (40 mg total) by mouth 2 (two) times daily as needed.  . gabapentin (NEURONTIN) 100 MG capsule Take 100 mg by mouth 3 (three) times daily.  Marland Kitchen gabapentin (NEURONTIN) 300 MG capsule Take 1 capsule (300 mg total) by mouth 3 (three) times daily.  . magnesium oxide (MAG-OX) 400 MG tablet TAKE 1 TABLET BY MOUTH EVERY DAY  . pantoprazole (PROTONIX) 40 MG tablet TAKE 1 TABLET BY MOUTH TWICE A DAY  . SPIRIVA HANDIHALER 18 MCG inhalation capsule INHALE ONCE DAILY  . tiZANidine (ZANAFLEX) 4 MG tablet Take 1 tablet (4 mg total) by mouth at bedtime as needed for muscle spasms.  Marland Kitchen triamcinolone ([employer] NASAL ALLERGY SPRAY) 55 MCG/ACT AERO nasal inhaler Place 2 sprays into the nose daily as needed (for allergies).  . Vitamin D, Ergocalciferol, (DRISDOL) 50000 units CAPS capsule Take 50,000 Units by mouth every Friday.   .  [DISCONTINUED] lisinopril (PRINIVIL,ZESTRIL) 5 MG tablet Take 5 mg by mouth daily.  . [DISCONTINUED] tiZANidine (ZANAFLEX) 4 MG tablet Take 1 tablet (4 mg total) by mouth at bedtime as needed for muscle spasms.   No facility-administered encounter medications on file as of 07/08/2017.     Review of Systems  Constitutional: Negative for appetite change and unexpected weight change.  HENT: Negative for congestion and sinus pressure.   Respiratory: Negative for cough, chest tightness and shortness of breath.   Cardiovascular: Negative for chest pain, palpitations and leg swelling.  Gastrointestinal: Negative for abdominal pain, diarrhea and nausea.  Genitourinary: Negative  for difficulty urinating and dysuria.  Musculoskeletal: Negative for joint swelling and myalgias.       Pain in sternum and around to back as outlned.    Skin: Negative for color change and rash.  Neurological: Negative for dizziness, light-headedness and headaches.  Psychiatric/Behavioral: Negative for agitation and dysphoric mood.       Objective:    Physical Exam  Constitutional: She appears well-developed and well-nourished. No distress.  HENT:  Nose: Nose normal.  Mouth/Throat: Oropharynx is clear and moist.  Neck: Neck supple. No thyromegaly present.  Cardiovascular: Normal rate and regular rhythm.  Pulmonary/Chest: Breath sounds normal. No respiratory distress. She has no wheezes.  Abdominal: Soft. Bowel sounds are normal. There is no tenderness.  Musculoskeletal: She exhibits no edema or tenderness.  Lymphadenopathy:    She has no cervical adenopathy.  Skin: No rash noted. No erythema.  Psychiatric: She has a normal mood and affect. Her behavior is normal.    BP 110/70 (BP Location: Left Arm, Patient Position: Sitting, Cuff Size: Normal)   Pulse 68   Temp 97.6 F (36.4 C) (Oral)   Resp 18   Wt 134 lb 9.6 oz (61.1 kg)   SpO2 97%   BMI 22.40 kg/m  Wt Readings from Last 3 Encounters:  07/08/17 134  lb 9.6 oz (61.1 kg)  06/25/17 127 lb (57.6 kg)  05/03/17 128 lb (58.1 kg)     Lab Results  Component Value Date   WBC 8.9 07/08/2017   HGB 15.3 (H) 07/08/2017   HCT 45.7 07/08/2017   PLT 272.0 07/08/2017   GLUCOSE 102 (H) 07/08/2017   CHOL 128 05/03/2017   TRIG 110.0 05/03/2017   HDL 30.70 (L) 05/03/2017   LDLCALC 75 05/03/2017   ALT 16 07/08/2017   AST 18 07/08/2017   NA 138 07/08/2017   K 4.1 07/08/2017   CL 99 07/08/2017   CREATININE 1.10 07/08/2017   BUN 26 (H) 07/08/2017   CO2 30 07/08/2017   TSH 3.39 10/13/2016   INR 1.10 11/24/2015   HGBA1C 5.0 03/26/2016       Assessment & Plan:   Problem List Items Addressed This Visit    Anemia - Primary    Has a history of iron deficient anemia and anemia or chronic renal disease.  Follow cbc.       Relevant Orders   CBC with Differential/Platelet (Completed)   Ferritin (Completed)   Anxiety    On citalopram.  Doing better.       CAD (coronary artery disease)    Followed by cardiology.  Stable. No active symptoms.       Cardiomyopathy, ischemic    S/p heart cath.  See report.  Followed by cardiology.  Continue risk factor modification.       Carotid arterial disease ([place])    Being followed by [facility] and cardiology.  Continue risk factor modification.        Chronic systolic CHF (congestive heart failure) (H[facility])    Lower extremity swelling improved.  No evidence of volume overload.  Continue current medication regimen.  Follow.       CKD (chronic kidney disease), stage III ([facility])    S/p recent right artery stent placement.  Blood pressure doing well.  Kidney function improved.  Follow metabolic panel.  Avoid antiinflammatories.        CKD (chronic kidney disease), stage IV ([facility])   Relevant Orders   Basic metabolic panel (Completed)   COPD (chronic obstructive pulmonary disease) ([place])  Breathing stable.  Follow.  Needs to quit smoking.       Dilated cardiomyopathy ([place])    Followed by cardiology.   Stable.       Essential hypertension    Blood pressure under good control.  Continue same medication regimen.  Follow pressures.  Follow metabolic panel.        Gastroesophageal reflux disease    Controlled on protonix.  Follow.       Hyperlipidemia   Relevant Orders   Hepatic function panel (Completed)   Pulmonary hypertension ([place])    Followed by cardiology.       Renal artery stenosis ([facility])    S/p stent.  Blood pressure and kidney function improved.  Follow.        Smoking    Have discussed the need to quit smoking.  Follow.       Sternum pain    Persistent pain in sternum (chest) that radiates around.  Saw ortho.  Concern over thoracic outlet syndrome.  Prednisone taper did not help.  zanaflex helps some.  Request refill.  Plans to f/u with ortho.  Discussed referral to Dr Sharlet Salina.  May need MRI.        Unsteady gait    Unsteady gait.  Has previously fallen.  Refer to therapy for evaluation and treatment.        Relevant Orders   Ambulatory referral to Physical Therapy       Einar Pheasant, MD

## 2017-07-09 ENCOUNTER — Telehealth: Payer: Self-pay | Admitting: Internal Medicine

## 2017-07-09 MED ORDER — TIZANIDINE HCL 4 MG PO TABS: 4.0000 mg | ORAL_TABLET | Freq: Every evening | ORAL | 0 refills | Status: DC | PRN

## 2017-07-09 NOTE — Telephone Encounter (Signed)
Ok to fill early ?

## 2017-07-09 NOTE — Telephone Encounter (Signed)
Copied from [place] [id number]. Topic: Quick Communication - Rx Refill/Question >> Jul 09, 2017  1:47 PM Ahmed Prima L wrote: Medication:  tiZANidine (ZANAFLEX) 4 MG tablet  Has the patient contacted their pharmacy? Yes they advised her bc of insurance it was too early. They said if the Dr Halford Decamp it they would fill it early   (Agent: If no, request that the patient contact the pharmacy for the refill.)   Preferred Pharmacy (with phone number or street name): [id number] [address], [place]: Please be advised that RX refills may take up to 3 business days. We ask that you follow-up with your pharmacy.

## 2017-07-09 NOTE — Telephone Encounter (Signed)
Ok

## 2017-07-11 ENCOUNTER — Encounter: Payer: Self-pay | Admitting: Internal Medicine

## 2017-07-11 DIAGNOSIS — R0789 Other chest pain: Secondary | ICD-10-CM | POA: Insufficient documentation

## 2017-07-11 DIAGNOSIS — R2681 Unsteadiness on feet: Secondary | ICD-10-CM | POA: Insufficient documentation

## 2017-07-11 NOTE — Assessment & Plan Note (Signed)
Breathing stable.  Follow.  Needs to quit smoking.

## 2017-07-11 NOTE — Assessment & Plan Note (Signed)
Controlled on protonix.  Follow.   

## 2017-07-11 NOTE — Assessment & Plan Note (Signed)
Followed by cardiology. Stable.   

## 2017-07-11 NOTE — Assessment & Plan Note (Signed)
Persistent pain in sternum (chest) that radiates around.  Saw ortho.  Concern over thoracic outlet syndrome.  Prednisone taper did not help.  zanaflex helps some.  Request refill.  Plans to f/u with ortho.  Discussed referral to Dr Sharlet Salina.  May need MRI.

## 2017-07-11 NOTE — Assessment & Plan Note (Signed)
Followed by cardiology.  Stable. No active symptoms.

## 2017-07-11 NOTE — Assessment & Plan Note (Signed)
Lower extremity swelling improved.  No evidence of volume overload.  Continue current medication regimen.  Follow.

## 2017-07-11 NOTE — Assessment & Plan Note (Signed)
S/p heart cath.  See report.  Followed by cardiology.  Continue risk factor modification.

## 2017-07-11 NOTE — Assessment & Plan Note (Signed)
S/p recent right artery stent placement.  Blood pressure doing well.  Kidney function improved.  Follow metabolic panel.  Avoid antiinflammatories.

## 2017-07-11 NOTE — Assessment & Plan Note (Signed)
Unsteady gait.  Has previously fallen.  Refer to therapy for evaluation and treatment.

## 2017-07-11 NOTE — Assessment & Plan Note (Signed)
Blood pressure under good control.  Continue same medication regimen.  Follow pressures.  Follow metabolic panel.   

## 2017-07-11 NOTE — Assessment & Plan Note (Signed)
Have discussed the need to quit smoking.  Follow.  

## 2017-07-11 NOTE — Assessment & Plan Note (Signed)
Followed by cardiology 

## 2017-07-11 NOTE — Assessment & Plan Note (Signed)
Has a history of iron deficient anemia and anemia or chronic renal disease.  Follow cbc.

## 2017-07-11 NOTE — Assessment & Plan Note (Signed)
Being followed by [facility] and cardiology.  Continue risk factor modification.

## 2017-07-11 NOTE — Assessment & Plan Note (Signed)
S/p stent.  Blood pressure and kidney function improved.  Follow.

## 2017-07-11 NOTE — Assessment & Plan Note (Signed)
On citalopram.  Doing better.

## 2017-07-12 ENCOUNTER — Encounter: Payer: Self-pay | Admitting: *Deleted

## 2017-07-12 NOTE — Telephone Encounter (Signed)
zanaflex sent to pharmacy 07/09/17

## 2017-07-14 NOTE — Telephone Encounter (Signed)
Unread mychart message mailed to patient 

## 2017-07-15 ENCOUNTER — Other Ambulatory Visit: Payer: Self-pay

## 2017-07-15 ENCOUNTER — Ambulatory Visit (INDEPENDENT_AMBULATORY_CARE_PROVIDER_SITE_OTHER): Payer: Medicare HMO | Admitting: *Deleted

## 2017-07-15 DIAGNOSIS — E538 Deficiency of other specified B group vitamins: Secondary | ICD-10-CM

## 2017-07-15 MED ORDER — CYANOCOBALAMIN 1000 MCG/ML IJ SOLN
1000.0000 ug | Freq: Once | INTRAMUSCULAR | Status: AC
  Administered 2017-07-15: 1000 ug via INTRAMUSCULAR

## 2017-07-15 MED ORDER — TIOTROPIUM BROMIDE MONOHYDRATE 18 MCG IN CAPS: ORAL_CAPSULE | RESPIRATORY_TRACT | 5 refills | Status: AC

## 2017-07-15 NOTE — Progress Notes (Addendum)
Patient presented for B 12 injection to left deltoid, patient voiced no concerns nor showed any signs of distress during injection.  Reviewed.  Dr Scott 

## 2017-07-16 ENCOUNTER — Other Ambulatory Visit: Payer: Self-pay | Admitting: Internal Medicine

## 2017-07-18 ENCOUNTER — Other Ambulatory Visit: Payer: Self-pay | Admitting: Internal Medicine

## 2017-07-30 ENCOUNTER — Other Ambulatory Visit: Payer: Self-pay | Admitting: Internal Medicine

## 2017-08-03 ENCOUNTER — Ambulatory Visit: Payer: Self-pay

## 2017-08-04 ENCOUNTER — Ambulatory Visit: Payer: Medicare HMO | Attending: Internal Medicine

## 2017-08-04 DIAGNOSIS — Z9181 History of falling: Secondary | ICD-10-CM | POA: Insufficient documentation

## 2017-08-04 DIAGNOSIS — M546 Pain in thoracic spine: Secondary | ICD-10-CM | POA: Insufficient documentation

## 2017-08-04 DIAGNOSIS — R2681 Unsteadiness on feet: Secondary | ICD-10-CM | POA: Insufficient documentation

## 2017-08-04 DIAGNOSIS — M25511 Pain in right shoulder: Secondary | ICD-10-CM | POA: Insufficient documentation

## 2017-08-04 DIAGNOSIS — R262 Difficulty in walking, not elsewhere classified: Secondary | ICD-10-CM | POA: Diagnosis not present

## 2017-08-04 DIAGNOSIS — M6281 Muscle weakness (generalized): Secondary | ICD-10-CM | POA: Diagnosis not present

## 2017-08-04 NOTE — Patient Instructions (Addendum)
Adduction: Hip - Knees Together (Sitting)   Sit with folded pillow between knees. Squeeze your rear end muscles together. Push knees together. Hold for _5__ seconds. Repeat _10__ times. Do __3_ times a day.  Copyright  [employer]. All rights reserved.

## 2017-08-04 NOTE — Therapy (Signed)
[facility] [address], [place], [address] Phone: [phone]   Fax:  [phone]  Physical Therapy Evaluation  Patient Details  Name: Sandra Ellison MRN: [id number] Date of Birth: 16-Sep-1945 Referring Provider: Einar Pheasant, MD   Encounter Date: 08/04/2017  PT End of Session - 08/04/17 1625    Visit Number  1    Number of Visits  17    Date for PT Re-Evaluation  09/30/17    PT Start Time  7035    PT Stop Time  1626    PT Time Calculation (min)  99 min    Equipment Utilized During Treatment  Gait belt    Activity Tolerance  Patient tolerated treatment well    Behavior During Therapy  [facility] for tasks assessed/performed       Past Medical History:  Diagnosis Date  . Allergy   . Arthritis   . Carotid arterial disease ([place])    a. 02/2015 Carotid U/S: RICA 00-93%, LICA 81-82%.  . Chronic combined systolic and diastolic CHF (congestive heart failure) ([place])    a. 01/2012 Echo: EF 30-35%, mod conc LVH, sev inf HK, mildly dil LA, mild-mod MR, Trace TR, RVSP 40-53mmHg.    Marland Kitchen Chronic kidney disease (CKD), stage II (mild)   . COPD (chronic obstructive pulmonary disease) ([place])   . Dilated cardiomyopathy ([place])    a. 02/2011 MV: EF 27%, anteroseptal, inf, inferolateral scar, no ischemia.  Marland Kitchen Dyspnea   . GERD (gastroesophageal reflux disease)   . History of abnormal Pap smear    a. s/p vaginal hysterectomy  . History of diverticulosis   . Hx of colonic polyps   . Hyperkalemia    a. 02/2015 in setting of entresto/AKI->entresto d/c'd.  . Hypertension   . Iron deficiency anemia    a. h/o transfusions, prev followed by heme/onc.  . Pulmonary HTN ([place])   . Stroke ([place])   . TIA (transient ischemic attack)   . Tobacco abuse     Past Surgical History:  Procedure Laterality Date  . CARDIAC CATHETERIZATION Left 08/05/2015   Procedure: Left Heart Cath and Coronary Angiography;  Surgeon: Minna Merritts, MD;   Location: [facility];  Service: Cardiovascular;  Laterality: Left;  . COLONOSCOPY  10/2010  . COLONOSCOPY WITH PROPOFOL N/A 12/15/2016   Procedure: COLONOSCOPY WITH PROPOFOL;  Surgeon: [name], MD;  Location: [facility];  Service: Endoscopy;  Laterality: N/A;  . ESOPHAGOGASTRODUODENOSCOPY (EGD) WITH PROPOFOL N/A 12/15/2016   Procedure: ESOPHAGOGASTRODUODENOSCOPY (EGD) WITH PROPOFOL;  Surgeon: [name], MD;  Location: [facility];  Service: Endoscopy;  Laterality: N/A;  . ORIF DISTAL RADIUS FRACTURE  2011   right  . RENAL ANGIOGRAPHY Right 02/17/2017   Procedure: Renal Angiography;  Surgeon: Algernon Huxley, MD;  Location: [facility];  Service: Cardiovascular;  Laterality: Right;  . TONSILLECTOMY  age 39  . VAGINAL HYSTERECTOMY  1981   secondary to abnormal pap smear    There were no vitals filed for this visit.   Subjective Assessment - 08/04/17 1453    Subjective  R shoulder, R chest: 1/10 currently, 10/10 at worst for the past 2 months.     Patient is accompained by:  -- friend.     Pertinent History  Unsteadiness of gait. Pt states having a small stroke about 1.5 years ago on Father's day resulting in L sided weakness which affected her balance. Went to [facility] in which  her UE was mainly worked on. Does not remember if her L LE was worked on.   Pt fell several weeks ago. The first fall was when pt was sitting at the side of her bed. Pt was trying to stand up with her feet crossed and fell. Hurt her chest while trying to reach out to the sides with both UEs. Currently has pain in her chest going towards the R side and to her back. Was seen by an orthopedic PA who does not think the chest pain is heart related. A couple of months ago, pt was walking into her house, lost her balance and fell. Pt was not using a NBQC.  Pt states having dizziness due to her heart medications per pt.  Not dizzy in the mornings prior to taking her heart medication.    Currently dizzy. Took her heart medications including carvedilol this morning.   Pt also states that she feels warm at times from the waist up. Also has bilateral foot neuropathy.   When pt fell most recently trying to get into the house, pt was turning to the R while holding items such as a glass of tea and her small bag. Pt was not using her NBQC.  Pt states that her biggest fear is falling.  Pt states that her walking feels like it's getting worse.  Pt states falling out of her bed over 4 months ago. Had x-rays for her chest area after she fell of the bed which did not reveal any fractures.  Did not feel like she hurt anything after she fell the second time when she was turning around to the R. Just a bruise at her back but its better now.  Pt also fractured her R wrist about 15 years ago.   Pt was walking independently prior to her stroke. Pt started ambulated with a rw and currently uses her NBQC since 6-8 months ago. Pt just decided to use her cane instead of her rollator walker.     Patient Stated Goals  Pt expresses desire to improve her balance. "I want to walk."    Currently in Pain?  Yes    Pain Score  1     Pain Location  -- R shoulder/chest area.     Pain Orientation  Right Chest, R trunk, back    Pain Type  --    Pain Onset  More than a month ago    Aggravating Factors   R UE/Chest: picking up items or carrying items with her R UE.     Pain Relieving Factors  R UE/Chest: aspercreme with lidocane. Cold packs. Tizanidine         [facility] PT Assessment - 08/04/17 1449      Assessment   Medical Diagnosis  Unsteady Gait    Referring Provider  Einar Pheasant, MD    Onset Date/Surgical Date  07/11/17 Date PT referral signed. Chronic condition.     Prior Therapy  Pt had UE therapy for her mini stroke. Unsure about her LE      Precautions   Precaution Comments  CHF, dizziness, fall risk      Restrictions   Other Position/Activity Restrictions  No known weight bearing restrictions       Balance Screen   Has the patient fallen in the past 6 months  Yes    How many times?  2 see subjective    Has the patient had a decrease in activity level because of a fear of  falling?   Yes    Is the patient reluctant to leave their home because of a fear of falling?   Yes      Home Environment   Additional Comments  Pt lives in a 1 story home with a friend (female). 5 steps to enter with bilateral rail.  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 Better able to lift and carry items with her R UE.       Observation/Other Assessments   Observations  no decreased sensation to light touch bilateral feet (compared with light touch to feet to R UE)    Focus on Therapeutic Outcomes (FOTO)   40      Strength   Right Hip Flexion  4/5    Right Hip Extension  4/5 seated manually resisted leg press    Right Hip ABduction  4/5 seated clamshell isometrics, hips < 90 degrees    Left Hip Flexion  4-/5    Left Hip Extension  4/5 seated manually resisted leg press    Left Hip ABduction  4/5 seated clamshell isometrics, hips < 90 degrees    Right Knee Extension  5/5    Left Knee Extension  5/5    Right Ankle Dorsiflexion  4+/5    Left Ankle Dorsiflexion  4/5      Ambulation/Gait   Gait Comments  Gait with NBQC on R side, decreased stance L LE, R pelvic drop during L LE stance phase, unsteady, slow.       Dynamic Gait Index   Level Surface  Moderate Impairment    Change in Gait Speed  Moderate Impairment    Gait with Horizontal Head Turns  Mild Impairment    Gait with Vertical Head Turns  Moderate Impairment    Gait and Pivot Turn  Mild Impairment able to perform in 3 seconds with NBQC    Step Over Obstacle  Moderate Impairment    Step Around Obstacles  Moderate Impairment    Steps  Moderate Impairment    Total Score  10    DGI comment:  < 19/24 suggests increased fall risk             Objective measurements  completed on examination: See above findings.       Precautions: CHF, dizziness, fall risk  Pt states having increased warmth sensation for about 2 years now. Heart doctor aware but does not know what causes it. Pt also states one of her kidneys is no longer working. Walking helps her bilateral knee arthritis  BP L arm sitting, mechanically taken, normal cuff. 100/68, HR 73   SpO2: 97% room air  BP L arm standing after 2 minutes, mechanically taken, normal cuff size. 108/49, HR 83  Pt states feeling more dizzy when standing.     BP L arm sitting, after DGI activities, mechanically taken 98/69, HR 83 97% SpO2      Ther-ex  Proper use of NBQC while walking, 2 point gait pattern.   Seated hip adduction glute max squeeze with folded pillow 10x5 seconds for 3 sets   Pt already performing standing hip abduction exercise at home  Seated manually resisted clam shell isometrics 10x2 with 5 seconds  Try clamshell at recliner type position next visit for HEP if appropriate.      Improved exercise technique, movement at target joints, use of target muscles after min to mod verbal, visual,  tactile cues.   Pt states feeling better than when she came in.     Patient is a 72 year old female who came to physical therapy secondary to unsteadiness of gait. She also presents with decreased balance, bilateral hip weakness, altered gait pattern, decreased endurance/deconditioning, R chest/UE/thoracic pain from one of her falls, and difficulty performing functional tasks such as walking, performing transfers, as well as picking up or carrying items. Patient will benefit from skilled physical therapy services to address the aforementioned deficits.   PT Education - 08/04/17 1914    Education provided  Yes    Education Details  ther-ex, HEP, plan of care    Person(s) Educated  Patient    Methods  Explanation;Demonstration;Tactile cues;Verbal cues;Handout    Comprehension  Returned  demonstration;Verbalized understanding          PT Long Term Goals - 08/04/17 1957      PT LONG TERM GOAL #1   Title  Pt will improve her DGI score to at least 15/24 with use of NBQC or least restrictive AD as a demonstration of improved balance.    Baseline  10/24 with NBQC (08/04/2017)    Time  8    Period  Weeks    Status  New    Target Date  09/30/17      PT LONG TERM GOAL #2   Title  Patient will improve bilateral hip strength by at least 1/2 MMT grade to promote ability to ambulate with decreased pelvic drop and improve ability to perform standing tasks.     Time  8    Period  Weeks    Status  New    Target Date  09/30/17      PT LONG TERM GOAL #3   Title  Patient will improve her FOTO score to 49 or more as a demonstration of improved ability to perform functional tasks.     Baseline  40 (08/04/2017)    Time  8    Period  Weeks    Status  New    Target Date  09/30/17      PT LONG TERM GOAL #4   Title  Patient will report a decrease in R shoulder/chest/thoracic pain to promote ability to lift and carry items.     Baseline  Pt reports increased pain in R shoulder/chest/thoracic area with lifting and carrying items with her R UE (08/04/2017).    Time  8    Period  Weeks    Status  New    Target Date  09/30/17             Plan - 08/04/17 1915    Clinical Impression Statement  Patient is a 72 year old female who came to physical therapy secondary to unsteadiness of gait. She also presents with decreased balance, bilateral hip weakness, altered gait pattern, decreased endurance/deconditioning, R chest/UE/thoracic pain from one of her falls, and difficulty performing functional tasks such as walking, performing transfers, as well as picking up or carrying items. Patient will benefit from skilled physical therapy services to address the aforementioned deficits.     History and Personal Factors relevant to plan of care:  Medical history, decreased balance, history of  falls, weakness, difficulty walking, difficulty performing standing tasks, carrying items     Clinical Presentation  Evolving    Clinical Presentation due to:  Pt states that her walking is getting worse.     Clinical Decision Making  Moderate    Rehab  Potential  Fair    Clinical Impairments Affecting Rehab Potential  (-) medical history, deconditioning, weakness, unsteadiness, fall risk, hx of falls, R UE/chest/thoracic pain, bilateral knee arthritis; (+) motivated, good home support    PT Frequency  2x / week    PT Duration  8 weeks    PT Treatment/Interventions  Therapeutic exercise;Therapeutic activities;Neuromuscular re-education;Manual techniques;Electrical Stimulation;Iontophoresis 4mg /ml Dexamethasone;Ultrasound;Gait training;Stair training;Functional mobility training;Balance training;Patient/family education;Dry needling    PT Next Visit Plan  bilateral hip strengthening, balance, gait, increase activity tolerance.     Consulted and Agree with Plan of Care  Patient       Patient will benefit from skilled therapeutic intervention in order to improve the following deficits and impairments:  Improper body mechanics, Difficulty walking, Dizziness, Decreased strength, Decreased endurance, Decreased balance, Decreased activity tolerance  Visit Diagnosis: History of falling - Plan: PT plan of care cert/re-cert  Unsteadiness on feet - Plan: PT plan of care cert/re-cert  Difficulty in walking, not elsewhere classified - Plan: PT plan of care cert/re-cert  Muscle weakness (generalized) - Plan: PT plan of care cert/re-cert  Pain in thoracic spine - Plan: PT plan of care cert/re-cert  Right shoulder pain, unspecified chronicity - Plan: PT plan of care cert/re-cert     Problem List Patient Active Problem List   Diagnosis Date Noted  . Sternum pain 07/11/2017  . Unsteady gait 07/11/2017  . Chest pain 05/06/2017  . Renovascular hypertension 02/12/2017  . Renal artery stenosis ([place])  02/12/2017  . CKD (chronic kidney disease), stage IV ([place]) 01/24/2017  . Neck pain 01/24/2017  . Constipation 01/24/2017  . Hypoxia 12/16/2016  . CVA (cerebral infarction) 11/25/2015  . TIA (transient ischemic attack) 11/24/2015  . Anxiety 08/31/2015  . Congestive dilated cardiomyopathy ([place])   . Cardiomyopathy, ischemic 07/24/2015  . Foot swelling 06/15/2015  . Chronic combined systolic and diastolic CHF (congestive heart failure) ([place])   . Dilated cardiomyopathy ([place])   . Carotid arterial disease ([place])   . Tobacco abuse   . Essential hypertension 03/17/2015  . CKD (chronic kidney disease), stage III ([place]) 03/06/2015  . Abnormal mammogram 02/23/2015  . Encounter for screening colonoscopy 01/26/2015  . Health care maintenance 08/12/2014  . Chronic systolic CHF (congestive heart failure) ([place]) 05/21/2014  . Microcalcifications of the breast 01/24/2014  . Pulmonary nodule 05/20/2013  . Insomnia 03/08/2013  . Ulnar nerve injury 03/07/2013  . Gastroesophageal reflux disease 02/28/2013  . History of colonic polyps 02/28/2013  . Diverticulosis 02/28/2013  . Anemia 02/28/2013  . COPD (chronic obstructive pulmonary disease) ([place]) 02/28/2013  . Pulmonary hypertension ([place]) 02/28/2013  . Nonischemic cardiomyopathy ([place]) 02/26/2012  . Carotid bruit 02/26/2012  . SOB (shortness of breath) 03/03/2011  . Smoking 03/03/2011  . Hyperlipidemia 03/03/2011  . CAD (coronary artery disease) 03/03/2011    Joneen Boers PT, DPT   08/04/2017, 8:11 PM  [facility] [address] [place], [address] Phone: [phone]   Fax:  [phone]  Name: Sandra Ellison MRN: [id number] Date of Birth: March 14, 1946

## 2017-08-06 ENCOUNTER — Other Ambulatory Visit: Payer: Self-pay | Admitting: Internal Medicine

## 2017-08-08 ENCOUNTER — Encounter: Payer: Self-pay | Admitting: Internal Medicine

## 2017-08-09 ENCOUNTER — Ambulatory Visit: Payer: Medicare HMO

## 2017-08-09 NOTE — Telephone Encounter (Signed)
Last OV 07/08/17 ok to fill Zanaflex?

## 2017-08-10 ENCOUNTER — Other Ambulatory Visit: Payer: Self-pay | Admitting: *Deleted

## 2017-08-10 MED ORDER — PANTOPRAZOLE SODIUM 40 MG PO TBEC: 40.0000 mg | DELAYED_RELEASE_TABLET | Freq: Two times a day (BID) | ORAL | 1 refills | Status: AC

## 2017-08-12 ENCOUNTER — Ambulatory Visit: Payer: Medicare HMO

## 2017-08-14 ENCOUNTER — Other Ambulatory Visit: Payer: Self-pay | Admitting: Cardiovascular Disease

## 2017-08-15 ENCOUNTER — Other Ambulatory Visit: Payer: Self-pay | Admitting: Internal Medicine

## 2017-08-16 ENCOUNTER — Ambulatory Visit: Payer: Medicare HMO | Attending: Internal Medicine

## 2017-08-16 DIAGNOSIS — R2681 Unsteadiness on feet: Secondary | ICD-10-CM | POA: Insufficient documentation

## 2017-08-16 DIAGNOSIS — M546 Pain in thoracic spine: Secondary | ICD-10-CM | POA: Insufficient documentation

## 2017-08-16 DIAGNOSIS — M25511 Pain in right shoulder: Secondary | ICD-10-CM | POA: Insufficient documentation

## 2017-08-16 DIAGNOSIS — Z9181 History of falling: Secondary | ICD-10-CM

## 2017-08-16 DIAGNOSIS — M6281 Muscle weakness (generalized): Secondary | ICD-10-CM | POA: Diagnosis not present

## 2017-08-16 DIAGNOSIS — R262 Difficulty in walking, not elsewhere classified: Secondary | ICD-10-CM | POA: Diagnosis not present

## 2017-08-16 NOTE — Patient Instructions (Addendum)
  Clamshells (reclined)   Sitting on your recliner with band around your knees.   Squeeze your rear end muscles together.   Stretch the band out with your knees.   Hold for 5 seconds.   Repeat 10 times.  Perform 3 sets daily or every other day.        ABDUCTION: Standing (Active)           Hold onto something sturdy for safety. Stand, feet flat. Lift right leg out to side. Use _0__ lbs. Complete __10_ repetitions. Perform __3_ sessions per day.  Repeat for your other side.

## 2017-08-16 NOTE — Telephone Encounter (Signed)
Do not see on current medication list.  Dr. Donivan Scull last note stated "Previously on lisinopril, minimal increase in potassium" Please advise if this should be refilled.

## 2017-08-16 NOTE — Therapy (Signed)
[facility] [address], [place], [address] Phone: [phone]   Fax:  [phone]  Physical Therapy Treatment  Patient Details  Name: Sandra Ellison MRN: [id number] Date of Birth: 05-Apr-1946 Referring Provider: Einar Pheasant, MD   Encounter Date: 08/16/2017  PT End of Session - 08/16/17 1350    Visit Number  2    Number of Visits  17    Date for PT Re-Evaluation  09/30/17    PT Start Time  3419    PT Stop Time  1431    PT Time Calculation (min)  43 min    Equipment Utilized During Treatment  Gait belt    Activity Tolerance  Patient tolerated treatment well    Behavior During Therapy  [facility] for tasks assessed/performed       Past Medical History:  Diagnosis Date  . Allergy   . Arthritis   . Carotid arterial disease ([place])    a. 02/2015 Carotid U/S: RICA 62-22%, LICA 97-98%.  . Chronic combined systolic and diastolic CHF (congestive heart failure) ([place])    a. 01/2012 Echo: EF 30-35%, mod conc LVH, sev inf HK, mildly dil LA, mild-mod MR, Trace TR, RVSP 40-61mmHg.    Marland Kitchen Chronic kidney disease (CKD), stage II (mild)   . COPD (chronic obstructive pulmonary disease) ([place])   . Dilated cardiomyopathy ([place])    a. 02/2011 MV: EF 27%, anteroseptal, inf, inferolateral scar, no ischemia.  Marland Kitchen Dyspnea   . GERD (gastroesophageal reflux disease)   . History of abnormal Pap smear    a. s/p vaginal hysterectomy  . History of diverticulosis   . Hx of colonic polyps   . Hyperkalemia    a. 02/2015 in setting of entresto/AKI->entresto d/c'd.  . Hypertension   . Iron deficiency anemia    a. h/o transfusions, prev followed by heme/onc.  . Pulmonary HTN ([place])   . Stroke ([place])   . TIA (transient ischemic attack)   . Tobacco abuse     Past Surgical History:  Procedure Laterality Date  . CARDIAC CATHETERIZATION Left 08/05/2015   Procedure: Left Heart Cath and Coronary Angiography;  Surgeon: Minna Merritts, MD;   Location: [place] [facility];  Service: Cardiovascular;  Laterality: Left;  . COLONOSCOPY  10/2010  . COLONOSCOPY WITH PROPOFOL N/A 12/15/2016   Procedure: COLONOSCOPY WITH PROPOFOL;  Surgeon: [name], MD;  Location: [facility] ENDOSCOPY;  Service: Endoscopy;  Laterality: N/A;  . ESOPHAGOGASTRODUODENOSCOPY (EGD) WITH PROPOFOL N/A 12/15/2016   Procedure: ESOPHAGOGASTRODUODENOSCOPY (EGD) WITH PROPOFOL;  Surgeon: [name], MD;  Location: [facility] ENDOSCOPY;  Service: Endoscopy;  Laterality: N/A;  . ORIF DISTAL RADIUS FRACTURE  2011   right  . RENAL ANGIOGRAPHY Right 02/17/2017   Procedure: Renal Angiography;  Surgeon: Algernon Huxley, MD;  Location: [place] [facility];  Service: Cardiovascular;  Laterality: Right;  . TONSILLECTOMY  age 29  . VAGINAL HYSTERECTOMY  1981   secondary to abnormal pap smear    There were no vitals filed for this visit.  Subjective Assessment - 08/16/17 1404    Subjective  Pt states that her mother passed away. Back (around the thoracic spine ) bothered her. Pt states feeling better when she left the first day.     Patient is accompained by:  -- friend.     Pertinent History  Unsteadiness of gait. Pt states having a small stroke about 1.5 years ago on Father's day resulting in L sided weakness  which affected her balance. Went to [facility] in which her UE was mainly worked on. Does not remember if her L LE was worked on.   Pt fell several weeks ago. The first fall was when pt was sitting at the side of her bed. Pt was trying to stand up with her feet crossed and fell. Hurt her chest while trying to reach out to the sides with both UEs. Currently has pain in her chest going towards the R side and to her back. Was seen by an orthopedic PA who does not think the chest pain is heart related. A couple of months ago, pt was walking into her house, lost her balance and fell. Pt was not using a NBQC.  Pt states having dizziness due to her heart medications per pt.  Not dizzy  in the mornings prior to taking her heart medication.   Currently dizzy. Took her heart medications including carvedilol this morning.   Pt also states that she feels warm at times from the waist up. Also has bilateral foot neuropathy.   When pt fell most recently trying to get into the house, pt was turning to the R while holding items such as a glass of tea and her small bag. Pt was not using her NBQC.  Pt states that her biggest fear is falling.  Pt states that her walking feels like it's getting worse.  Pt states falling out of her bed over 4 months ago. Had x-rays for her chest area after she fell of the bed which did not reveal any fractures.  Did not feel like she hurt anything after she fell the second time when she was turning around to the R. Just a bruise at her back but its better now.  Pt also fractured her R wrist about 15 years ago.   Pt was walking independently prior to her stroke. Pt started ambulated with a rw and currently uses her NBQC since 6-8 months ago. Pt just decided to use her cane instead of her rollator walker.     Patient Stated Goals  Pt expresses desire to improve her balance. "I want to walk."    Currently in Pain?  Other (Comment) no pain eve provided    Pain Onset  More than a month ago                              PT Education - 08/16/17 1629    Education provided  Yes    Education Details  ther-ex, HEP    Person(s) Educated  Patient    Methods  Explanation;Demonstration;Tactile cues;Verbal cues;Handout    Comprehension  Returned demonstration;Verbalized understanding        Objectives Pt states feeling better when she left the first day.   Therapeutic exercise  BP L arm sitting, mechanically taken, normal cuff. 100/36, HR 75   102/90 manually taken L arm.            BP R arm sitting manually taken 108/80  SpO2: 98% room air  Seated manually resisted clamshell isometrics 10x3 with 5 second holds  Seated manually resited leg  press 10x each LE  Side stepping 5 ft to the L and 5 ft to the R with bilateral UE assist 4 x. Rest break secondary to fatigue   96% SpO2 room air  SLS 5x2 with 2 second holds each LE with bilateral UE assist to promote balance and glute  med muscle strengtheining.    Standing hip abduction with bilateral UE assist 10x each LE to promote glute med muscle strengthening.   Vitals monitored secondary to medical history.   Improved exercise technique, movement at target joints, use of target muscles after min to mod verbal, visual, tactile cues.    Worked on glute med and max strengthening to promote lumbopelvic control in standing and balance. Pt tolerated session well without aggravation of symptoms.    PT Long Term Goals - 08/04/17 1957      PT LONG TERM GOAL #1   Title  Pt will improve her DGI score to at least 15/24 with use of NBQC or least restrictive AD as a demonstration of improved balance.    Baseline  10/24 with NBQC (08/04/2017)    Time  8    Period  Weeks    Status  New    Target Date  09/30/17      PT LONG TERM GOAL #2   Title  Patient will improve bilateral hip strength by at least 1/2 MMT grade to promote ability to ambulate with decreased pelvic drop and improve ability to perform standing tasks.     Time  8    Period  Weeks    Status  New    Target Date  09/30/17      PT LONG TERM GOAL #3   Title  Patient will improve her FOTO score to 49 or more as a demonstration of improved ability to perform functional tasks.     Baseline  40 (08/04/2017)    Time  8    Period  Weeks    Status  New    Target Date  09/30/17      PT LONG TERM GOAL #4   Title  Patient will report a decrease in R shoulder/chest/thoracic pain to promote ability to lift and carry items.     Baseline  Pt reports increased pain in R shoulder/chest/thoracic area with lifting and carrying items with her R UE (08/04/2017).    Time  8    Period  Weeks    Status  New    Target Date  09/30/17             Plan - 08/16/17 1629    Clinical Impression Statement  Worked on glute med and max strengthening to promote lumbopelvic control in standing and balance. Pt tolerated session well without aggravation of symptoms.    Rehab Potential  Fair    Clinical Impairments Affecting Rehab Potential  (-) medical history, deconditioning, weakness, unsteadiness, fall risk, hx of falls, R UE/chest/thoracic pain, bilateral knee arthritis; (+) motivated, good home support    PT Frequency  2x / week    PT Duration  8 weeks    PT Treatment/Interventions  Therapeutic exercise;Therapeutic activities;Neuromuscular re-education;Manual techniques;Electrical Stimulation;Iontophoresis 4mg /ml Dexamethasone;Ultrasound;Gait training;Stair training;Functional mobility training;Balance training;Patient/family education;Dry needling    PT Next Visit Plan  bilateral hip strengthening, balance, gait, increase activity tolerance.     Consulted and Agree with Plan of Care  Patient       Patient will benefit from skilled therapeutic intervention in order to improve the following deficits and impairments:  Improper body mechanics, Difficulty walking, Dizziness, Decreased strength, Decreased endurance, Decreased balance, Decreased activity tolerance  Visit Diagnosis: History of falling  Unsteadiness on feet  Difficulty in walking, not elsewhere classified  Muscle weakness (generalized)     Problem List Patient Active Problem List   Diagnosis Date Noted  .  Sternum pain 07/11/2017  . Unsteady gait 07/11/2017  . Chest pain 05/06/2017  . Renovascular hypertension 02/12/2017  . Renal artery stenosis ([place]) 02/12/2017  . CKD (chronic kidney disease), stage IV ([place]) 01/24/2017  . Neck pain 01/24/2017  . Constipation 01/24/2017  . Hypoxia 12/16/2016  . CVA (cerebral infarction) 11/25/2015  . TIA (transient ischemic attack) 11/24/2015  . Anxiety 08/31/2015  . Congestive dilated cardiomyopathy ([place])   .  Cardiomyopathy, ischemic 07/24/2015  . Foot swelling 06/15/2015  . Chronic combined systolic and diastolic CHF (congestive heart failure) ([place])   . Dilated cardiomyopathy ([place])   . Carotid arterial disease ([place])   . Tobacco abuse   . Essential hypertension 03/17/2015  . CKD (chronic kidney disease), stage III ([place]) 03/06/2015  . Abnormal mammogram 02/23/2015  . Encounter for screening colonoscopy 01/26/2015  . Health care maintenance 08/12/2014  . Chronic systolic CHF (congestive heart failure) ([place]) 05/21/2014  . Microcalcifications of the breast 01/24/2014  . Pulmonary nodule 05/20/2013  . Insomnia 03/08/2013  . Ulnar nerve injury 03/07/2013  . Gastroesophageal reflux disease 02/28/2013  . History of colonic polyps 02/28/2013  . Diverticulosis 02/28/2013  . Anemia 02/28/2013  . COPD (chronic obstructive pulmonary disease) ([place]) 02/28/2013  . Pulmonary hypertension ([place]) 02/28/2013  . Nonischemic cardiomyopathy ([place]) 02/26/2012  . Carotid bruit 02/26/2012  . SOB (shortness of breath) 03/03/2011  . Smoking 03/03/2011  . Hyperlipidemia 03/03/2011  . CAD (coronary artery disease) 03/03/2011    Joneen Boers PT, DPT   08/16/2017, 4:36 PM  [facility] [address], [place], [address] Phone: [phone]   Fax:  [phone]  Name: SUZANA SOHAIL MRN: [id number] Date of Birth: 1946-05-13

## 2017-08-17 ENCOUNTER — Ambulatory Visit (INDEPENDENT_AMBULATORY_CARE_PROVIDER_SITE_OTHER): Payer: Medicare HMO

## 2017-08-17 DIAGNOSIS — E538 Deficiency of other specified B group vitamins: Secondary | ICD-10-CM | POA: Diagnosis not present

## 2017-08-17 MED ORDER — CYANOCOBALAMIN 1000 MCG/ML IJ SOLN
1000.0000 ug | Freq: Once | INTRAMUSCULAR | Status: AC
  Administered 2017-08-17: 1000 ug via INTRAMUSCULAR

## 2017-08-17 NOTE — Progress Notes (Addendum)
Patient presents today for B12 injection. Right deltoid. Patient voiced no concerns nor showed any signs of distress during injection.   Reviewed.  Dr Nicki Reaper

## 2017-08-18 ENCOUNTER — Ambulatory Visit: Payer: Medicare HMO

## 2017-08-23 ENCOUNTER — Ambulatory Visit: Payer: Medicare HMO

## 2017-08-23 DIAGNOSIS — M25511 Pain in right shoulder: Secondary | ICD-10-CM | POA: Diagnosis not present

## 2017-08-23 DIAGNOSIS — R2681 Unsteadiness on feet: Secondary | ICD-10-CM | POA: Diagnosis not present

## 2017-08-23 DIAGNOSIS — Z9181 History of falling: Secondary | ICD-10-CM

## 2017-08-23 DIAGNOSIS — M6281 Muscle weakness (generalized): Secondary | ICD-10-CM | POA: Diagnosis not present

## 2017-08-23 DIAGNOSIS — R262 Difficulty in walking, not elsewhere classified: Secondary | ICD-10-CM | POA: Diagnosis not present

## 2017-08-23 DIAGNOSIS — M546 Pain in thoracic spine: Secondary | ICD-10-CM
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
## 2017-08-24 ENCOUNTER — Encounter: Payer: Self-pay | Admitting: Internal Medicine

## 2017-08-26 ENCOUNTER — Ambulatory Visit: Payer: Medicare HMO

## 2017-08-29 ENCOUNTER — Other Ambulatory Visit: Payer: Self-pay | Admitting: Internal Medicine

## 2017-08-30 ENCOUNTER — Other Ambulatory Visit: Payer: Self-pay | Admitting: Family Medicine

## 2017-08-30 ENCOUNTER — Other Ambulatory Visit: Payer: Self-pay | Admitting: Internal Medicine

## 2017-08-30 ENCOUNTER — Telehealth: Payer: Self-pay | Admitting: Internal Medicine

## 2017-08-30 ENCOUNTER — Ambulatory Visit: Payer: Medicare HMO

## 2017-08-30 DIAGNOSIS — M5414 Radiculopathy, thoracic region: Secondary | ICD-10-CM | POA: Diagnosis not present

## 2017-08-30 NOTE — Telephone Encounter (Signed)
Copied from [place] [id number]. Topic: Quick Communication - See Telephone Encounter >> Aug 30, 2017  1:50 PM Bea Graff, NT wrote: CRM for notification. See Telephone encounter for: 08/30/17. Pt states her orthopedic dr will be refilling the tiZANidine (ZANAFLEX). But she needs a refill of citalopram (CELEXA). Uses [facility] on [address].

## 2017-08-30 NOTE — Telephone Encounter (Signed)
Copied from [place] [id number]. Topic: Appointment Scheduling - Scheduling Inquiry for Clinic >> Aug 30, 2017 10:51 AM Cecelia Byars, NT wrote: Reason for CRM Patient was told to reschedule her appointment from 09/12/17  ,there are none available until 11/08/17 and she would like to be seen earlier than that time if possible please advise  [phone]

## 2017-09-01 NOTE — Telephone Encounter (Signed)
OK to send in zanaflex?

## 2017-09-02 ENCOUNTER — Ambulatory Visit: Payer: Medicare HMO

## 2017-09-02 MED ORDER — TIZANIDINE HCL 4 MG PO TABS: 4.0000 mg | ORAL_TABLET | Freq: Every evening | ORAL | 0 refills | Status: AC | PRN

## 2017-09-02 NOTE — Telephone Encounter (Signed)
Pharmacy received and filled Celexa

## 2017-09-02 NOTE — Telephone Encounter (Signed)
I ok'd the zanaflex #30 with no refills.  Per review of chart, the citalopram was sent in on 08/16/17 (#90 with no refills).  Need to confirm pharmacy received.

## 2017-09-06 ENCOUNTER — Ambulatory Visit: Payer: Medicare HMO

## 2017-09-08 ENCOUNTER — Ambulatory Visit
Admission: RE | Admit: 2017-09-08 | Discharge: 2017-09-08 | Disposition: A | Discharge status: Home / Self Care | Payer: Medicare HMO | Source: Ambulatory Visit | Attending: Family Medicine | Admitting: Family Medicine

## 2017-09-08 ENCOUNTER — Encounter: Payer: Self-pay | Admitting: Internal Medicine

## 2017-09-08 DIAGNOSIS — M546 Pain in thoracic spine: Secondary | ICD-10-CM | POA: Diagnosis not present

## 2017-09-08 DIAGNOSIS — M5414 Radiculopathy, thoracic region: Secondary | ICD-10-CM | POA: Diagnosis not present

## 2017-09-09 ENCOUNTER — Ambulatory Visit: Payer: Medicare HMO

## 2017-09-09 NOTE — Telephone Encounter (Signed)
Please call and let pt know that I am not in the office the rest of this pm and out of office until next Tuesday.  If having problems breathing, she needs to be seen and needs to be seen asap.

## 2017-09-09 NOTE — Telephone Encounter (Signed)
Patient aware and stated if acute symptoms she would be evaluated at [facility] or ED

## 2017-09-10 ENCOUNTER — Ambulatory Visit: Payer: Medicare HMO | Admitting: Internal Medicine

## 2017-09-13 ENCOUNTER — Ambulatory Visit: Payer: Medicare HMO

## 2017-09-21 ENCOUNTER — Ambulatory Visit (INDEPENDENT_AMBULATORY_CARE_PROVIDER_SITE_OTHER): Payer: Medicare HMO

## 2017-09-21 DIAGNOSIS — E538 Deficiency of other specified B group vitamins: Secondary | ICD-10-CM | POA: Diagnosis not present

## 2017-09-21 MED ORDER — CYANOCOBALAMIN 1000 MCG/ML IJ SOLN
1000.0000 ug | Freq: Once | INTRAMUSCULAR | Status: AC
  Administered 2017-09-21: 1000 ug via INTRAMUSCULAR

## 2017-09-21 NOTE — Progress Notes (Signed)
Patient comes in for B 12 injection.  Injected left deltoid.  Patient tolerated injection well.   Reviewed.  Dr Scott 

## 2017-09-27 ENCOUNTER — Encounter: Payer: Self-pay | Admitting: Internal Medicine

## 2017-09-27 ENCOUNTER — Ambulatory Visit (INDEPENDENT_AMBULATORY_CARE_PROVIDER_SITE_OTHER): Payer: Medicare HMO | Admitting: Internal Medicine

## 2017-09-27 DIAGNOSIS — F17200 Nicotine dependence, unspecified, uncomplicated: Secondary | ICD-10-CM

## 2017-09-27 DIAGNOSIS — M546 Pain in thoracic spine: Secondary | ICD-10-CM

## 2017-09-27 DIAGNOSIS — D649 Anemia, unspecified: Secondary | ICD-10-CM

## 2017-09-27 DIAGNOSIS — I255 Ischemic cardiomyopathy: Secondary | ICD-10-CM

## 2017-09-27 DIAGNOSIS — N183 Chronic kidney disease, stage 3 unspecified: Secondary | ICD-10-CM

## 2017-09-27 DIAGNOSIS — I5022 Chronic systolic (congestive) heart failure: Secondary | ICD-10-CM

## 2017-09-27 DIAGNOSIS — E782 Mixed hyperlipidemia: Secondary | ICD-10-CM | POA: Diagnosis not present

## 2017-09-27 DIAGNOSIS — I25118 Atherosclerotic heart disease of native coronary artery with other forms of angina pectoris: Secondary | ICD-10-CM | POA: Diagnosis not present

## 2017-09-27 DIAGNOSIS — J432 Centrilobular emphysema: Secondary | ICD-10-CM | POA: Diagnosis not present

## 2017-09-27 DIAGNOSIS — I779 Disorder of arteries and arterioles, unspecified: Secondary | ICD-10-CM | POA: Diagnosis not present

## 2017-09-27 DIAGNOSIS — I272 Pulmonary hypertension, unspecified: Secondary | ICD-10-CM

## 2017-09-27 DIAGNOSIS — F419 Anxiety disorder, unspecified: Secondary | ICD-10-CM | POA: Diagnosis not present

## 2017-09-27 DIAGNOSIS — K219 Gastro-esophageal reflux disease without esophagitis: Secondary | ICD-10-CM | POA: Diagnosis not present

## 2017-09-27 DIAGNOSIS — I420 Dilated cardiomyopathy: Secondary | ICD-10-CM

## 2017-09-27 DIAGNOSIS — I10 Essential (primary) hypertension: Secondary | ICD-10-CM | POA: Diagnosis not present

## 2017-09-27 DIAGNOSIS — I701 Atherosclerosis of renal artery: Secondary | ICD-10-CM

## 2017-09-27 DIAGNOSIS — I739 Peripheral vascular disease, unspecified: Secondary | ICD-10-CM

## 2017-09-27 LAB — BASIC METABOLIC PANEL
BUN: 23 mg/dL (ref 6–23)
CALCIUM: 9.6 mg/dL (ref 8.4–10.5)
CHLORIDE: 99 meq/L (ref 96–112)
CO2: 27 meq/L (ref 19–32)
CREATININE: 1.73 mg/dL — AB (ref 0.40–1.20)
GFR: 30.83 mL/min — ABNORMAL LOW (ref >60.00)
GLUCOSE: 89 mg/dL (ref 70–99)
Potassium: 4.7 mEq/L (ref 3.5–5.1)
SODIUM: 139 meq/L (ref 135–145)

## 2017-09-27 LAB — HEPATIC FUNCTION PANEL
ALBUMIN: 3.9 g/dL (ref 3.5–5.2)
ALT: 10 U/L (ref 0–35)
AST: 14 U/L (ref 0–37)
Alkaline Phosphatase: 75 U/L (ref 39–117)
BILIRUBIN TOTAL: 1.4 mg/dL — AB (ref 0.2–1.2)
Bilirubin, Direct: 0.5 mg/dL — ABNORMAL HIGH (ref 0.0–0.3)
Total Protein: 6.5 g/dL (ref 6.0–8.3)

## 2017-09-27 LAB — LIPID PANEL
Cholesterol: 122 mg/dL (ref 0–200)
HDL: 29 mg/dL — ABNORMAL LOW (ref >39.00)
LDL CALC: 72 mg/dL (ref 0–99)
NonHDL: 93.31
TRIGLYCERIDES: 106 mg/dL (ref 0.0–149.0)
Total CHOL/HDL Ratio: 4
VLDL: 21.2 mg/dL (ref 0.0–40.0)

## 2017-09-27 LAB — TSH: TSH: 4.19 u[IU]/mL (ref 0.35–4.50)

## 2017-09-27 NOTE — Progress Notes (Signed)
Patient ID: Sandra Ellison, female   DOB: [birth year], 72 y.o.   MRN: [id number]   Subjective:    Patient ID: Sandra Ellison, female    DOB: 02/23/1946, 72 y.o.   MRN: [id number]  HPI  Patient here for a scheduled follow up. She is accompanied by her boyfriend.  History obtained from both of them.  Still having side/back pain.  See previous notes for details.  Saw Dr Manfred Shirts.  Had MRI thoracic spine.  This revealed no acute or focal abnormality to explain her pain.  They recommended PT and massage therapy and to continue her medication as she was doing.  Persistent pain.  Discussed therapy.  She is in agreement.  No chest pain.  Breathing stable.  Eating.  No nausea or vomiting.  Bowels moving.  Discussed the need to stop smoking.     Past Medical History:  Diagnosis Date  . Allergy   . Arthritis   . Carotid arterial disease ([place])    a. 02/2015 Carotid U/S: RICA 42-70%, LICA 62-37%.  . Chronic combined systolic and diastolic CHF (congestive heart failure) ([place])    a. 01/2012 Echo: EF 30-35%, mod conc LVH, sev inf HK, mildly dil LA, mild-mod MR, Trace TR, RVSP 40-46mHg.    .Marland KitchenChronic kidney disease (CKD), stage II (mild)   . COPD (chronic obstructive pulmonary disease) ([place]   . Dilated cardiomyopathy ([place]    a. 02/2011 MV: EF 27%, anteroseptal, inf, inferolateral scar, no ischemia.  .Marland KitchenDyspnea   . GERD (gastroesophageal reflux disease)   . History of abnormal Pap smear    a. s/p vaginal hysterectomy  . History of diverticulosis   . Hx of colonic polyps   . Hyperkalemia    a. 02/2015 in setting of entresto/AKI->entresto d/c'd.  . Hypertension   . Iron deficiency anemia    a. h/o transfusions, prev followed by heme/onc.  . Pulmonary HTN ([place]   . Stroke ([place]   . TIA (transient ischemic attack)   . Tobacco abuse    Past Surgical History:  Procedure Laterality Date  . CARDIAC CATHETERIZATION Left 08/05/2015   Procedure: Left Heart Cath and Coronary Angiography;   Surgeon: TMinna Merritts MD;  [place] [facility];  Service: Cardiovascular;  Laterality: Left;  . COLONOSCOPY  10/2010  . COLONOSCOPY WITH PROPOFOL N/A 12/15/2016   Procedure: COLONOSCOPY WITH PROPOFOL;  Surgeon: BRobert Bellow MD;  Location: [facility] ENDOSCOPY;  Service: Endoscopy;  Laterality: N/A;  . ESOPHAGOGASTRODUODENOSCOPY (EGD) WITH PROPOFOL N/A 12/15/2016   Procedure: ESOPHAGOGASTRODUODENOSCOPY (EGD) WITH PROPOFOL;  Surgeon: BRobert Bellow MD;  Location: [facility] ENDOSCOPY;  Service: Endoscopy;  Laterality: N/A;  . ORIF DISTAL RADIUS FRACTURE  2011   right  . RENAL ANGIOGRAPHY Right 02/17/2017   Procedure: Renal Angiography;  Surgeon: DAlgernon Huxley MD;  [facility];  Service: Cardiovascular;  Laterality: Right;  . TONSILLECTOMY  age 72 . VAGINAL HYSTERECTOMY  1981   secondary to abnormal pap smear   Family History  Problem Relation Age of Onset  . Arthritis Mother   . Alcohol abuse Father   . Arthritis Father   . Cancer Father        throat/spine  . Breast cancer Neg Hx    Social History   Socioeconomic History  . Marital status: Divorced    Spouse name: Not on file  . Number of children: 1  . Years of education: Not on file  . Highest education  level: Not on file  Occupational History  . Not on file  Social Needs  . Financial resource strain: Not on file  . Food insecurity:    Worry: Not on file    Inability: Not on file  . Transportation needs:    Medical: Not on file    Non-medical: Not on file  Tobacco Use  . Smoking status: Current Every Day Smoker    Packs/day: 0.50    Years: 42.00    Pack years: 21.00    Types: Cigarettes  . Smokeless tobacco: Never Used  . Tobacco comment: Down to 3 cigarettes per day  Substance and Sexual Activity  . Alcohol use: No    Alcohol/week: 0.0 oz  . Drug use: No  . Sexual activity: Never  Lifestyle  . Physical activity:    Days per week: Not on file    Minutes per session: Not on file   . Stress: Not on file  Relationships  . Social connections:    Talks on phone: Not on file    Gets together: Not on file    Attends religious service: Not on file    Active member of club or organization: Not on file    Attends meetings of clubs or organizations: Not on file    Relationship status: Not on file  Other Topics Concern  . Not on file  Social History Narrative  . Not on file    Outpatient Encounter Medications as of 09/27/2017  Medication Sig  . albuterol (PROVENTIL) (2.5 MG/3ML) 0.083% nebulizer solution USE ONE TREATMENT EVERY 6 HOURS AS NEEDED (Patient taking differently: USE ONE TREATMENT EVERY 6 HOURS AS NEEDED FOR WHEEZING/SHORTNESS OF BREATH)  . albuterol (VENTOLIN HFA) 108 (90 Base) MCG/ACT inhaler Inhale 2 puffs every 6 hours as needed  . aspirin EC 81 MG tablet Take 81 mg by mouth daily.  . carvedilol (COREG) 6.25 MG tablet Take 1 tablet (6.25 mg total) by mouth 2 (two) times daily with a meal.  . citalopram (CELEXA) 10 MG tablet TAKE 1 TABLET (10 MG TOTAL) BY MOUTH DAILY IN THE EVENING.  Marland Kitchen clopidogrel (PLAVIX) 75 MG tablet Take 1 tablet (75 mg total) by mouth daily.  . Cyanocobalamin 1000 MCG/ML KIT Inject 1 mL as directed every 30 (thirty) days.  . furosemide (LASIX) 20 MG tablet Take 2 tablets (40 mg total) by mouth 2 (two) times daily as needed.  . gabapentin (NEURONTIN) 100 MG capsule Take 100 mg by mouth 3 (three) times daily.  Marland Kitchen gabapentin (NEURONTIN) 300 MG capsule TAKE 1 CAPSULE (300 MG TOTAL) BY MOUTH 3 (THREE) TIMES DAILY.  . pantoprazole (PROTONIX) 40 MG tablet Take 1 tablet (40 mg total) by mouth 2 (two) times daily.  Marland Kitchen tiotropium (SPIRIVA HANDIHALER) 18 MCG inhalation capsule INHALE ONCE DAILY  . tiZANidine (ZANAFLEX) 4 MG tablet Take 1 tablet (4 mg total) by mouth at bedtime as needed for muscle spasms.  Marland Kitchen triamcinolone ([employer] NASAL ALLERGY SPRAY) 55 MCG/ACT AERO nasal inhaler Place 2 sprays into the nose daily as needed (for allergies).  . Vitamin  D, Ergocalciferol, (DRISDOL) 50000 units CAPS capsule Take 50,000 Units by mouth every Friday.   . [DISCONTINUED] atorvastatin (LIPITOR) 40 MG tablet TAKE 1 TABLET BY MOUTH EVERY DAY (Patient taking differently: TAKE 1/2 TABLET BY MOUTH EVERY DAY)  . [DISCONTINUED] Fe Fum-FePoly-Vit C-Vit B3 (INTEGRA) 62.5-62.5-40-3 MG CAPS One per day (Patient not taking: Reported on 08/04/2017)  . [DISCONTINUED] magnesium oxide (MAG-OX) 400 MG tablet TAKE 1  TABLET BY MOUTH EVERY DAY (Patient not taking: Reported on 08/04/2017)   No facility-administered encounter medications on file as of 09/27/2017.     Review of Systems  Constitutional: Negative for appetite change and unexpected weight change.  HENT: Negative for congestion and sinus pressure.   Respiratory: Negative for cough, chest tightness and shortness of breath.   Cardiovascular: Negative for chest pain and palpitations.       No increased leg swelling.   Gastrointestinal: Negative for diarrhea, nausea and vomiting.  Genitourinary: Negative for difficulty urinating and dysuria.  Musculoskeletal: Negative for joint swelling.       Right side pain as outlined.    Skin: Negative for color change and rash.  Neurological: Negative for dizziness, light-headedness and headaches.  Psychiatric/Behavioral: Negative for agitation and dysphoric mood.       Objective:     Blood pressure rechecked by me (right arm):  124/72  Physical Exam  Constitutional: She appears well-developed and well-nourished. No distress.  HENT:  Nose: Nose normal.  Mouth/Throat: Oropharynx is clear and moist.  Neck: Neck supple. No thyromegaly present.  Cardiovascular: Normal rate and regular rhythm.  Pulmonary/Chest: Breath sounds normal. No respiratory distress. She has no wheezes.  Abdominal: Soft. Bowel sounds are normal. There is no tenderness.  Musculoskeletal: She exhibits no edema or tenderness.  Lymphadenopathy:    She has no cervical adenopathy.  Skin: No rash  noted. No erythema.  Psychiatric: She has a normal mood and affect. Her behavior is normal.    BP 100/72 (BP Location: Left Arm, Patient Position: Sitting, Cuff Size: Normal)   Pulse 70   Temp 97.6 F (36.4 C) (Oral)   Resp 18   Wt 133 lb (60.3 kg)   SpO2 99%   BMI 22.13 kg/m  Wt Readings from Last 3 Encounters:  09/27/17 133 lb (60.3 kg)  07/08/17 134 lb 9.6 oz (61.1 kg)  06/25/17 127 lb (57.6 kg)     Lab Results  Component Value Date   WBC 8.9 07/08/2017   HGB 15.3 (H) 07/08/2017   HCT 45.7 07/08/2017   PLT 272.0 07/08/2017   GLUCOSE 89 09/27/2017   CHOL 122 09/27/2017   TRIG 106.0 09/27/2017   HDL 29.00 (L) 09/27/2017   LDLCALC 72 09/27/2017   ALT 10 09/27/2017   AST 14 09/27/2017   NA 139 09/27/2017   K 4.7 09/27/2017   CL 99 09/27/2017   CREATININE 1.73 (H) 09/27/2017   BUN 23 09/27/2017   CO2 27 09/27/2017   TSH 4.19 09/27/2017   INR 1.10 11/24/2015   HGBA1C 5.0 03/26/2016    Mr Thoracic Spine Wo Contrast  Result Date: 09/08/2017 CLINICAL DATA:  Thoracic radiculitis. Mid back pain extends around the right. Right scapular pain. EXAM: MRI THORACIC SPINE WITHOUT CONTRAST TECHNIQUE: Multiplanar, multisequence MR imaging of the thoracic spine was performed. No intravenous contrast was administered. COMPARISON:  Two-view chest x-ray 07/03/2016. CT of the chest 06/19/2015 FINDINGS: Alignment: AP alignment is anatomic. Rightward curvature of the upper thoracic spine is centered at T4. Vertebrae: Superior endplate Schmorl's nodes are present from T7 through T11. Marrow signal and vertebral body heights are otherwise normal. Cord: There is slight prominence of the central canal without hydromyelia. Normal cord signal is present otherwise. Cord size and shape is normal. No mass lesion is evident. Paraspinal and other soft tissues: Small pleural effusions are more prominent on the right. There is mild associated atelectasis. Paraspinous soft tissues are otherwise unremarkable.  Disc levels: No  significant focal disc protrusion or central canal stenosis is present. Foramina are patent bilaterally. IMPRESSION: 1. No acute or focal abnormality to explain right-sided radicular symptoms. 2. Mild curvature of the thoracic spine is centered to the right at T4. Electronically Signed   By: San Morelle M.D.   On: 09/08/2017 10:38       Assessment & Plan:   Problem List Items Addressed This Visit    Anemia    Has a history of iron deficient anemia and anemia of chronic renal disease.  Follow cbc.        Anxiety    On citalopram.  Doing well on this.  Follow.        CAD (coronary artery disease)    Followed by cardiology.  Continue risk factor modification.  Stable.        Cardiomyopathy, ischemic    Followed by cardiology.       Carotid arterial disease ([facility])    Followed by [facility].       Chronic systolic CHF (congestive heart failure) ([place])    Does not appear to be volume overloaded.  Followed by cardiology.  Continue current medications.  Check metabolic panel.       CKD (chronic kidney disease), stage III ([place])    S/p right renal artery stent placement.  Blood pressures as outlined.  Recheck kidney function.       COPD (chronic obstructive pulmonary disease) (HCC)    Breathing stable.  Discussed the need to quit smoking.        Dilated cardiomyopathy ([place])    Followed by cardiology.  Overall stable.       Essential hypertension    Blood pressure under good control.  Continue same medication regimen.  Follow pressures.  Follow metabolic panel.        Relevant Orders   TSH (Completed)   Basic metabolic panel (Completed)   Gastroesophageal reflux disease    Controlled on current regimen.        Hyperlipidemia    Low cholesterol diet and exercise.  Follow lipid panel.       Relevant Orders   Hepatic function panel (Completed)   Lipid panel (Completed)   Pulmonary hypertension ([place])    Followed by cardiology.       Renal artery  stenosis (HCC)    S/p stent.  Recheck metabolic panel.  Last kidney function - improved.       Right-sided thoracic back pain    Persistent right side pain.  Had recent MRI.  Recommended therapy.  Will arrange.  Continue muscle relaxer.       Relevant Orders   Ambulatory referral to Physical Therapy   Smoking    Discussed the need to quit smoking.  She declines to quit.  Is trying to decrease.  Follow.           Einar Pheasant, MD

## 2017-09-28 ENCOUNTER — Encounter: Payer: Self-pay | Admitting: Internal Medicine

## 2017-09-28 ENCOUNTER — Other Ambulatory Visit: Payer: Self-pay | Admitting: Internal Medicine

## 2017-09-28 DIAGNOSIS — R17 Unspecified jaundice: Secondary | ICD-10-CM

## 2017-09-28 DIAGNOSIS — M546 Pain in thoracic spine: Secondary | ICD-10-CM

## 2017-09-28 NOTE — Telephone Encounter (Signed)
Please see lab result note on pt and call pt.  Thanks

## 2017-09-28 NOTE — Telephone Encounter (Signed)
See result note.  

## 2017-09-28 NOTE — Telephone Encounter (Signed)
Duplicate.  See attached.  Please notify pt of lab results.  See lab result note.

## 2017-09-28 NOTE — Progress Notes (Signed)
Order placed for GI referral.   

## 2017-09-28 NOTE — Telephone Encounter (Signed)
See result note for f/u lab appt.  Also, need to call Dr Marcy Salvo office and see if can get earlier appt.

## 2017-09-29 ENCOUNTER — Telehealth: Payer: Self-pay

## 2017-09-29 NOTE — Telephone Encounter (Signed)
Spoke with patient and advised that she move appt up and scheduled her for f/u labs

## 2017-09-29 NOTE — Telephone Encounter (Signed)
Spoke with patient. She called to move appt up with Dr. Abigail Butts and she stated that they told her that we needed to fax over info. Advised patient that I would send over everything once met b is rechecked to confirm stable.

## 2017-09-29 NOTE — Telephone Encounter (Signed)
Copied from [place] [phone]. Topic: Inquiry >> Sep 29, 2017  8:41 AM Sandra Ellison, NT wrote: Reason for CRM: patient is calling and requesting Larena Glassman to give her a call about her kidney function lab results.

## 2017-09-30 ENCOUNTER — Encounter: Payer: Self-pay | Admitting: Internal Medicine

## 2017-09-30 DIAGNOSIS — M546 Pain in thoracic spine: Secondary | ICD-10-CM | POA: Insufficient documentation

## 2017-09-30 NOTE — Assessment & Plan Note (Signed)
Followed by cardiology 

## 2017-09-30 NOTE — Assessment & Plan Note (Signed)
Does not appear to be volume overloaded.  Followed by cardiology.  Continue current medications.  Check metabolic panel.

## 2017-09-30 NOTE — Assessment & Plan Note (Signed)
Controlled on current regimen.   

## 2017-09-30 NOTE — Assessment & Plan Note (Signed)
Blood pressure under good control.  Continue same medication regimen.  Follow pressures.  Follow metabolic panel.   

## 2017-09-30 NOTE — Assessment & Plan Note (Signed)
Followed by [facility].   

## 2017-09-30 NOTE — Assessment & Plan Note (Signed)
On citalopram.  Doing well on this.  Follow.

## 2017-09-30 NOTE — Assessment & Plan Note (Signed)
Followed by cardiology.  Continue risk factor modification.  Stable.   

## 2017-09-30 NOTE — Assessment & Plan Note (Signed)
S/p right renal artery stent placement.  Blood pressures as outlined.  Recheck kidney function.

## 2017-09-30 NOTE — Assessment & Plan Note (Signed)
Low cholesterol diet and exercise.  Follow lipid panel.   

## 2017-09-30 NOTE — Assessment & Plan Note (Signed)
Persistent right side pain.  Had recent MRI.  Recommended therapy.  Will arrange.  Continue muscle relaxer.

## 2017-09-30 NOTE — Assessment & Plan Note (Signed)
Breathing stable.  Discussed the need to quit smoking.

## 2017-09-30 NOTE — Assessment & Plan Note (Signed)
Followed by cardiology.  Overall stable.

## 2017-09-30 NOTE — Assessment & Plan Note (Signed)
Has a history of iron deficient anemia and anemia of chronic renal disease.  Follow [facility].

## 2017-09-30 NOTE — Assessment & Plan Note (Signed)
Discussed the need to quit smoking.  She declines to quit.  Is trying to decrease.  Follow.

## 2017-09-30 NOTE — Assessment & Plan Note (Signed)
S/p stent.  Recheck metabolic panel.  Last kidney function - improved.

## 2017-10-04 ENCOUNTER — Telehealth: Payer: Self-pay | Admitting: Radiology

## 2017-10-04 ENCOUNTER — Other Ambulatory Visit: Payer: Self-pay | Admitting: Internal Medicine

## 2017-10-04 DIAGNOSIS — N183 Chronic kidney disease, stage 3 unspecified: Secondary | ICD-10-CM

## 2017-10-04 NOTE — Progress Notes (Signed)
Order placed for f/u met b 

## 2017-10-04 NOTE — Telephone Encounter (Signed)
Pt coming in tomorrow for labs, please place future orders. Thank you.  

## 2017-10-04 NOTE — Telephone Encounter (Signed)
Order placed for f/u lab.   

## 2017-10-05 ENCOUNTER — Other Ambulatory Visit (INDEPENDENT_AMBULATORY_CARE_PROVIDER_SITE_OTHER): Payer: Medicare HMO

## 2017-10-05 DIAGNOSIS — N183 Chronic kidney disease, stage 3 unspecified: Secondary | ICD-10-CM

## 2017-10-05 LAB — BASIC METABOLIC PANEL
BUN: 20 mg/dL (ref 6–23)
CHLORIDE: 102 meq/L (ref 96–112)
CO2: 30 mEq/L (ref 19–32)
Calcium: 8.9 mg/dL (ref 8.4–10.5)
Creatinine, Ser: 1.34 mg/dL — ABNORMAL HIGH (ref 0.40–1.20)
GFR: 41.39 mL/min — AB (ref >60.00)
Glucose, Bld: 97 mg/dL (ref 70–99)
POTASSIUM: 4.4 meq/L (ref 3.5–5.1)
SODIUM: 137 meq/L (ref 135–145)

## 2017-10-11 ENCOUNTER — Encounter: Payer: Self-pay | Admitting: Internal Medicine

## 2017-10-20 ENCOUNTER — Other Ambulatory Visit: Payer: Self-pay | Admitting: *Deleted

## 2017-10-20 ENCOUNTER — Telehealth: Payer: Self-pay | Admitting: Internal Medicine

## 2017-10-20 MED ORDER — ALBUTEROL SULFATE (2.5 MG/3ML) 0.083% IN NEBU: INHALATION_SOLUTION | RESPIRATORY_TRACT | 1 refills | Status: AC

## 2017-10-20 NOTE — Telephone Encounter (Signed)
Copied from [place] [phone]. Topic: Inquiry >> Oct 20, 2017  7:28 AM Pricilla Handler wrote: Reason for CRM: Patient called requesting a refill of Albuterol (PROVENTIL) (2.5 MG/3ML) 0.083% nebulizer solution. Patient's preferred pharmacy is [id number] [address], [place] [phone] (Phone) [phone] (Fax).       Thank You!!!

## 2017-10-20 NOTE — Telephone Encounter (Signed)
Rx refill per protocol- LOV: 09/27/17- COPD addressed

## 2017-10-26 ENCOUNTER — Ambulatory Visit: Payer: Medicare HMO

## 2017-10-28 DIAGNOSIS — N183 Chronic kidney disease, stage 3 (moderate): Secondary | ICD-10-CM | POA: Diagnosis not present

## 2017-10-28 DIAGNOSIS — N2581 Secondary hyperparathyroidism of renal origin: Secondary | ICD-10-CM | POA: Diagnosis not present

## 2017-10-28 DIAGNOSIS — I129 Hypertensive chronic kidney disease with stage 1 through stage 4 chronic kidney disease, or unspecified chronic kidney disease: Secondary | ICD-10-CM | POA: Diagnosis not present

## 2017-10-28 DIAGNOSIS — I701 Atherosclerosis of renal artery: Secondary | ICD-10-CM | POA: Diagnosis not present

## 2017-10-28 DIAGNOSIS — Z720 Tobacco use: Secondary | ICD-10-CM | POA: Diagnosis not present

## 2017-10-28 DIAGNOSIS — R601 Generalized edema: Secondary | ICD-10-CM | POA: Diagnosis not present

## 2017-10-29 ENCOUNTER — Ambulatory Visit: Payer: Medicare HMO

## 2017-11-04 ENCOUNTER — Encounter: Payer: Self-pay | Admitting: Emergency Medicine

## 2017-11-04 ENCOUNTER — Emergency Department
Admission: EM | Admit: 2017-11-04 | Discharge: 2017-11-04 | Disposition: A | Discharge status: Home / Self Care | Payer: Medicare HMO | Attending: Emergency Medicine | Admitting: Emergency Medicine

## 2017-11-04 ENCOUNTER — Other Ambulatory Visit: Payer: Self-pay

## 2017-11-04 DIAGNOSIS — J449 Chronic obstructive pulmonary disease, unspecified: Secondary | ICD-10-CM | POA: Insufficient documentation

## 2017-11-04 DIAGNOSIS — F17210 Nicotine dependence, cigarettes, uncomplicated: Secondary | ICD-10-CM | POA: Diagnosis not present

## 2017-11-04 DIAGNOSIS — G8929 Other chronic pain: Secondary | ICD-10-CM | POA: Diagnosis not present

## 2017-11-04 DIAGNOSIS — M549 Dorsalgia, unspecified: Secondary | ICD-10-CM | POA: Diagnosis present

## 2017-11-04 DIAGNOSIS — I5042 Chronic combined systolic (congestive) and diastolic (congestive) heart failure: Secondary | ICD-10-CM | POA: Insufficient documentation

## 2017-11-04 DIAGNOSIS — N289 Disorder of kidney and ureter, unspecified: Secondary | ICD-10-CM | POA: Insufficient documentation

## 2017-11-04 DIAGNOSIS — R17 Unspecified jaundice: Secondary | ICD-10-CM | POA: Diagnosis not present

## 2017-11-04 DIAGNOSIS — I130 Hypertensive heart and chronic kidney disease with heart failure and stage 1 through stage 4 chronic kidney disease, or unspecified chronic kidney disease: Secondary | ICD-10-CM | POA: Insufficient documentation

## 2017-11-04 DIAGNOSIS — N184 Chronic kidney disease, stage 4 (severe): Secondary | ICD-10-CM | POA: Insufficient documentation

## 2017-11-04 LAB — CBC
HCT: 49.8 % — ABNORMAL HIGH (ref 35.0–47.0)
Hemoglobin: 16.6 g/dL — ABNORMAL HIGH (ref 12.0–16.0)
MCH: 31.9 pg (ref 26.0–34.0)
MCHC: 33.5 g/dL (ref 32.0–36.0)
MCV: 95.4 fL (ref 80.0–100.0)
PLATELETS: 268 10*3/uL (ref 150–440)
RBC: 5.22 MIL/uL — AB (ref 3.80–5.20)
RDW: 15.6 % — ABNORMAL HIGH (ref 11.5–14.5)
WBC: 5.6 10*3/uL (ref 3.6–11.0)

## 2017-11-04 LAB — COMPREHENSIVE METABOLIC PANEL
ALBUMIN: 4 g/dL (ref 3.5–5.0)
ALT: 12 U/L — ABNORMAL LOW (ref 14–54)
AST: 22 U/L (ref 15–41)
Alkaline Phosphatase: 86 U/L (ref 38–126)
Anion gap: 14 (ref 5–15)
BUN: 26 mg/dL — ABNORMAL HIGH (ref 6–20)
CHLORIDE: 95 mmol/L — AB (ref 101–111)
CO2: 27 mmol/L (ref 22–32)
CREATININE: 1.64 mg/dL — AB (ref 0.44–1.00)
Calcium: 9 mg/dL (ref 8.9–10.3)
GFR calc non Af Amer: 30 mL/min — ABNORMAL LOW (ref >60)
GFR, EST AFRICAN AMERICAN: 35 mL/min — AB (ref >60)
GLUCOSE: 108 mg/dL — AB (ref 65–99)
Potassium: 4.4 mmol/L (ref 3.5–5.1)
SODIUM: 136 mmol/L (ref 135–145)
Total Bilirubin: 2.6 mg/dL — ABNORMAL HIGH (ref 0.3–1.2)
Total Protein: 7.2 g/dL (ref 6.5–8.1)

## 2017-11-04 LAB — LIPASE, BLOOD: LIPASE: 19 U/L (ref 11–51)

## 2017-11-04 MED ORDER — SODIUM CHLORIDE 0.9 % IV BOLUS
1000.0000 mL | Freq: Once | INTRAVENOUS | Status: AC
Start: 2017-11-04 — End: 2017-11-04
  Administered 2017-11-04: 1000 mL via INTRAVENOUS

## 2017-11-04 MED ORDER — ONDANSETRON 4 MG PO TBDP
4.0000 mg | ORAL_TABLET | Freq: Once | ORAL | Status: AC | PRN
  Administered 2017-11-04: 4 mg via ORAL
  Filled 2017-11-04: qty 1

## 2017-11-04 MED ORDER — FENTANYL CITRATE (PF) 100 MCG/2ML IJ SOLN
50.0000 ug | Freq: Once | INTRAMUSCULAR | Status: AC
  Administered 2017-11-04: 50 ug via INTRAVENOUS
  Filled 2017-11-04: qty 2

## 2017-11-04 MED ORDER — OXYCODONE HCL 5 MG PO TABS
5.0000 mg | ORAL_TABLET | Freq: Once | ORAL | Status: AC
Start: 2017-11-04 — End: 2017-11-04
  Administered 2017-11-04: 5 mg via ORAL
  Filled 2017-11-04: qty 1

## 2017-11-04 MED ORDER — ONDANSETRON HCL 4 MG/2ML IJ SOLN
4.0000 mg | Freq: Once | INTRAMUSCULAR | Status: AC
  Administered 2017-11-04: 4 mg via INTRAVENOUS
  Filled 2017-11-04: qty 2

## 2017-11-04 MED ORDER — OXYCODONE HCL 5 MG PO TABS: 5.0000 mg | ORAL_TABLET | ORAL | 0 refills | Status: AC | PRN

## 2017-11-04 MED ORDER — SODIUM CHLORIDE 0.9 % IV BOLUS
1000.0000 mL | Freq: Once | INTRAVENOUS | Status: DC
Start: 2017-11-04 — End: 2017-11-04

## 2017-11-04 NOTE — ED Notes (Signed)
ED Provider at bedside. 

## 2017-11-04 NOTE — ED Notes (Signed)
Pt reports feeling better after meds.  Pt sitting up on stretcher  Family with pt.  Iv fluids infusing.

## 2017-11-04 NOTE — ED Provider Notes (Signed)
[facility] Emergency Department Provider Note  ____________________________________________  Time seen: Approximately 5:36 PM  I have reviewed the triage vital signs and the nursing notes.   HISTORY  Chief Complaint Back Pain; Nausea; and Torticollis    HPI Sandra Ellison is a 72 y.o. female multiple chronic comorbidities including CKD, COPD, CAD and CHF, presenting for unchanged chronic pain.  The patient is a difficult historian and is accompanied by her boyfriend who is able to add some additional details.  Initially, the patient tells me she is coming for chronic headache that is unchanged compared to usual.  Then she states that she is having chronic back pain.  Then she states she is having chronic chest pain.  She denies that any of these  symptoms are new.  There is nothing new in character or severity in any of her pain.  At baseline, she uses Zanaflex and gabapentin for pain.  She has not tried any other pain medications including Tylenol, and states she cannot take Motrin due to her CKD.  She has never discussed the long-term pain management plan with her primary care physician, Dr. Nicki Reaper.  Both she and her boyfriend describe that she has had pain every day for several years, but occasionally the pain is worsened today is 1 of the worse days.  The patient denies any fevers, chills, nausea or vomiting, diarrhea.  This tingling or weakness.  No difficulty walking.  Past Medical History:  Diagnosis Date  . Allergy   . Arthritis   . Carotid arterial disease ([place])    a. 02/2015 Carotid U/S: RICA 95-28%, LICA 41-32%.  . Chronic combined systolic and diastolic CHF (congestive heart failure) ([place])    a. 01/2012 Echo: EF 30-35%, mod conc LVH, sev inf HK, mildly dil LA, mild-mod MR, Trace TR, RVSP 40-14mmHg.    Marland Kitchen Chronic kidney disease (CKD), stage II (mild)   . COPD (chronic obstructive pulmonary disease) ([place])   . Dilated cardiomyopathy ([place])    a. 02/2011 MV:  EF 27%, anteroseptal, inf, inferolateral scar, no ischemia.  Marland Kitchen Dyspnea   . GERD (gastroesophageal reflux disease)   . History of abnormal Pap smear    a. s/p vaginal hysterectomy  . History of diverticulosis   . Hx of colonic polyps   . Hyperkalemia    a. 02/2015 in setting of entresto/AKI->entresto d/c'd.  . Hypertension   . Iron deficiency anemia    a. h/o transfusions, prev followed by heme/onc.  . Pulmonary HTN ([place])   . Stroke ([place])   . TIA (transient ischemic attack)   . Tobacco abuse     Patient Active Problem List   Diagnosis Date Noted  . Right-sided thoracic back pain 09/30/2017  . Sternum pain 07/11/2017  . Unsteady gait 07/11/2017  . Chest pain 05/06/2017  . Renovascular hypertension 02/12/2017  . Renal artery stenosis ([place]) 02/12/2017  . CKD (chronic kidney disease), stage IV ([place]) 01/24/2017  . Neck pain 01/24/2017  . Constipation 01/24/2017  . Hypoxia 12/16/2016  . CVA (cerebral infarction) 11/25/2015  . TIA (transient ischemic attack) 11/24/2015  . Anxiety 08/31/2015  . Congestive dilated cardiomyopathy ([place])   . Cardiomyopathy, ischemic 07/24/2015  . Foot swelling 06/15/2015  . Chronic combined systolic and diastolic CHF (congestive heart failure) ([place])   . Dilated cardiomyopathy ([place])   . Carotid arterial disease ([place])   . Tobacco abuse   . Essential hypertension 03/17/2015  . CKD (chronic kidney disease), stage III ([place]) 03/06/2015  .  Abnormal mammogram 02/23/2015  . Encounter for screening colonoscopy 01/26/2015  . Health care maintenance 08/12/2014  . Chronic systolic CHF (congestive heart failure) ([place]) 05/21/2014  . Microcalcifications of the breast 01/24/2014  . Pulmonary nodule 05/20/2013  . Insomnia 03/08/2013  . Ulnar nerve injury 03/07/2013  . Gastroesophageal reflux disease 02/28/2013  . History of colonic polyps 02/28/2013  . Diverticulosis 02/28/2013  . Anemia 02/28/2013  . COPD (chronic obstructive pulmonary disease) ([place])  02/28/2013  . Pulmonary hypertension ([place]) 02/28/2013  . Nonischemic cardiomyopathy ([place]) 02/26/2012  . Carotid bruit 02/26/2012  . SOB (shortness of breath) 03/03/2011  . Smoking 03/03/2011  . Hyperlipidemia 03/03/2011  . CAD (coronary artery disease) 03/03/2011    Past Surgical History:  Procedure Laterality Date  . CARDIAC CATHETERIZATION Left 08/05/2015   Procedure: Left Heart Cath and Coronary Angiography;  Surgeon: Minna Merritts, MD;  Location: [place] [facility];  Service: Cardiovascular;  Laterality: Left;  . COLONOSCOPY  10/2010  . COLONOSCOPY WITH PROPOFOL N/A 12/15/2016   Procedure: COLONOSCOPY WITH PROPOFOL;  Surgeon: [name], MD;  Location: [facility] ENDOSCOPY;  Service: Endoscopy;  Laterality: N/A;  . ESOPHAGOGASTRODUODENOSCOPY (EGD) WITH PROPOFOL N/A 12/15/2016   Procedure: ESOPHAGOGASTRODUODENOSCOPY (EGD) WITH PROPOFOL;  Surgeon: [name], MD;  Location: [facility] ENDOSCOPY;  Service: Endoscopy;  Laterality: N/A;  . kidney stent    . ORIF DISTAL RADIUS FRACTURE  2011   right  . RENAL ANGIOGRAPHY Right 02/17/2017   Procedure: Renal Angiography;  Surgeon: Algernon Huxley, MD;  Location: [facility] [facility];  Service: Cardiovascular;  Laterality: Right;  . TONSILLECTOMY  age 59  . VAGINAL HYSTERECTOMY  1981   secondary to abnormal pap smear    Current Outpatient Rx  . Order #: [id number] Class: Normal  . Order #: [id number] Class: Normal  . Order #: [id number] Class: Historical Med  . Order #: [id number] Class: Normal  . Order #: [id number] Class: Normal  . Order #: [id number] Class: Normal  . Order #: [id number] Class: Historical Med  . Order #: [id number] Class: Normal  . Order #: [id number] Class: Historical Med  . Order #: [id number] Class: Normal  . Order #: [id number] Class: Print  . Order #: [id number] Class: Normal  . Order #: [id number] Class: Normal  . Order #: [id number] Class: Normal  . Order #: [id number] Class: Historical Med  . Order #: [id number] Class: Historical  Med    Allergies Ranitidine hcl and Entresto [sacubitril-valsartan]  Family History  Problem Relation Age of Onset  . Arthritis Mother   . Alcohol abuse Father   . Arthritis Father   . Cancer Father        throat/spine  . Breast cancer Neg Hx     Social History Social History   Tobacco Use  . Smoking status: Current Every Day Smoker    Packs/day: 0.50    Years: 42.00    Pack years: 21.00    Types: Cigarettes  . Smokeless tobacco: Never Used  . Tobacco comment: Down to 3 cigarettes per day  Substance Use Topics  . Alcohol use: No    Alcohol/week: 0.0 oz  . Drug use: No    Review of Systems Constitutional: No fever/chills.  No lightheadedness or syncope. Eyes: No visual changes. ENT: No sore throat. No congestion or rhinorrhea. Cardiovascular: Positive chest pain. Denies palpitations. Respiratory: Denies shortness of breath.  No cough. Gastrointestinal: No abdominal pain.  No nausea, no vomiting.  No diarrhea.  No constipation. Genitourinary: Negative for dysuria. Musculoskeletal: As of her chronic unchanged neck and back pain. Skin: Negative for  rash. Neurological: Positive for headache. No focal numbness, tingling or weakness.     ____________________________________________   PHYSICAL EXAM:  VITAL SIGNS: ED Triage Vitals  Enc Vitals Group     BP 11/04/17 1404 (!) 100/38     Pulse Rate 11/04/17 1404 83     Resp 11/04/17 1404 18     Temp 11/04/17 1404 97.9 F (36.6 C)     Temp Source 11/04/17 1404 Oral     SpO2 11/04/17 1404 97 %     Weight 11/04/17 1405 135 lb (61.2 kg)     Height --      Head Circumference --      Peak Flow --      Pain Score --      Pain Loc --      Pain Edu? --      Excl. in [place]? --     Constitutional: Patient is alert, oriented, and able to answer questions appropriately.  Her GCS is 15.  She is intermittently crying and crying because of her pain.  She is nontoxic in appearance.  She is able to walk around without any  significant difficulty. Eyes: Conjunctivae are normal.  EOMI. No scleral icterus. Head: Atraumatic. Nose: No congestion/rhinnorhea. Mouth/Throat: Mucous membranes are mildly dry.  Neck: No stridor.  Supple.  No JVD.  No meningismus. Cardiovascular: Normal rate, regular rhythm. No murmurs, rubs or gallops.  Respiratory: Normal respiratory effort.  No accessory muscle use or retractions. Lungs CTAB.  No wheezes, rales or ronchi. Gastrointestinal: Soft, nontender and nondistended.  No guarding or rebound.  No peritoneal signs. Musculoskeletal: Diffuse tenderness throughout the neck and back without focality and not localized to the midline.  No LE edema. No ttp in the calves or palpable cords.  Negative Homan's sign. Neurologic:  A&Ox3.  Speech is clear.  Face and smile are symmetric.  EOMI.  Moves all extremities well. Skin:  Skin is warm, dry and intact. No rash noted. Psychiatric: Depressed mood and bizarre affect.  ____________________________________________   LABS (all labs ordered are listed, but only abnormal results are displayed)  Labs Reviewed  COMPREHENSIVE METABOLIC PANEL - Abnormal; Notable for the following components:      Result Value   Chloride 95 (*)    Glucose, Bld 108 (*)    BUN 26 (*)    Creatinine, Ser 1.64 (*)    ALT 12 (*)    Total Bilirubin 2.6 (*)    GFR calc non Af Amer 30 (*)    GFR calc Af Amer 35 (*)    All other components within normal limits  CBC - Abnormal; Notable for the following components:   RBC 5.22 (*)    Hemoglobin 16.6 (*)    HCT 49.8 (*)    RDW 15.6 (*)    All other components within normal limits  LIPASE, BLOOD  URINALYSIS, COMPLETE (UACMP) WITH MICROSCOPIC   ____________________________________________  EKG  ED ECG REPORT I, Eula Listen, the attending physician, personally viewed and interpreted this ECG.   Date: 11/04/2017  EKG Time: 1405  Rate: 82  Rhythm: normal sinus rhythm  Axis: leftward  Intervals:none   ST&T Change: No STEMI  ____________________________________________  RADIOLOGY  No results found.  ____________________________________________   PROCEDURES  Procedure(s) performed: None  Procedures  Critical Care performed: No ____________________________________________   INITIAL IMPRESSION / ASSESSMENT AND PLAN / ED COURSE  Pertinent labs & imaging results that were available during my care of the patient were reviewed by  me and considered in my medical decision making (see chart for details).  72 y.o. female with a history of chronic pain on Zanaflex and gabapentin, presenting with pain in multiple different areas; poor historian who changes the story every time I asked.  Overall, the patient is hemodynamically stable.  She has no evidence of any acute pathology today.  On her laboratory studies, she does have some mild acute on chronic renal insufficiency and appears to be hemoconcentrated.  I have ordered a liter of fluid and have asked her to have her primary care physician, Dr. Nicki Reaper, recheck her creatinine.  In addition, she has a bilirubin of 1.6 today, and she has had a chronic history of elevated bilirubin.  She is not overdosing her Tylenol and the remainder of LFTs are normal.  This will also need to be rechecked.  I treated the patient with IV fentanyl and will give her oxycodone for longer pain control.  I will give her a dose for short course of oxycodone as needed but have recommended that she talk to her primary care physician about her long-term pain management plan, including a plan for breakthrough pain.  I did discuss follow-up instructions as well as return precautions with both the patient and her boyfriend.  ____________________________________________  FINAL CLINICAL IMPRESSION(S) / ED DIAGNOSES  Final diagnoses:  Other chronic pain  Renal insufficiency  Elevated bilirubin         NEW MEDICATIONS STARTED DURING THIS VISIT:  New Prescriptions    OXYCODONE (ROXICODONE) 5 MG IMMEDIATE RELEASE TABLET    Take 1 tablet (5 mg total) by mouth every 4 (four) hours as needed for severe pain.      Eula Listen, MD 11/04/17 1754

## 2017-11-04 NOTE — ED Triage Notes (Signed)
Patient sitting in wheelchair moaning and praying for help.  When asked she says her back and neck are hurting.  She is nauseated.  Per husband--pt fell in January.  Has good and bad days.  Has nausea sometimes and the chronic back pain.

## 2017-11-04 NOTE — Discharge Instructions (Addendum)
May continue your Zanaflex and gabapentin for pain.  In addition, you may take low doses of Tylenol; 650 mg every 8 hours or less.  Oxycodone is for severe pain.  Do not drive within 8 hours of taking oxycodone.  It is imperative that you talk to Dr. Nicki Reaper about a long-term pain management plan and what to do in the setting of acute pain breakthrough.  Make sure that you drink plenty of fluid and have Dr. Nicki Reaper recheck your kidney function.  Your bilirubin is also elevated today and will need to be rechecked.  Return to the emergency department for any worsening or change in pain, fever, or any other symptoms concerning to you.

## 2017-11-04 NOTE — ED Notes (Signed)
Report off to dejae rn

## 2017-11-08 ENCOUNTER — Other Ambulatory Visit: Payer: Self-pay | Admitting: Internal Medicine

## 2017-11-09 ENCOUNTER — Ambulatory Visit (INDEPENDENT_AMBULATORY_CARE_PROVIDER_SITE_OTHER): Payer: Medicare HMO

## 2017-11-09 ENCOUNTER — Other Ambulatory Visit: Payer: Self-pay | Admitting: Family Medicine

## 2017-11-09 DIAGNOSIS — E538 Deficiency of other specified B group vitamins: Secondary | ICD-10-CM | POA: Diagnosis not present

## 2017-11-09 MED ORDER — CYANOCOBALAMIN 1000 MCG/ML IJ SOLN
1000.0000 ug | Freq: Once | INTRAMUSCULAR | Status: AC
  Administered 2017-11-09: 1000 ug via INTRAMUSCULAR

## 2017-11-09 NOTE — Telephone Encounter (Signed)
Last OV 09/27/17 last filled by Dr.Sonnenberg 03/22/17 18 g 2 rf

## 2017-11-09 NOTE — Progress Notes (Addendum)
Patient comes in today for a vitamin B 12 injection. Administered in right deltoid IM. Patient tolerated well.  Reviewed.  Dr Scott 

## 2017-11-13 ENCOUNTER — Emergency Department: Payer: Medicare HMO

## 2017-11-13 ENCOUNTER — Emergency Department
Admission: EM | Admit: 2017-11-13 | Discharge: 2017-12-06 | Disposition: E | Payer: Medicare HMO | Attending: Emergency Medicine | Admitting: Emergency Medicine

## 2017-11-13 ENCOUNTER — Other Ambulatory Visit: Payer: Self-pay

## 2017-11-13 ENCOUNTER — Telehealth: Payer: Self-pay | Admitting: Family Medicine

## 2017-11-13 ENCOUNTER — Encounter: Payer: Self-pay | Admitting: Radiology

## 2017-11-13 DIAGNOSIS — Z7902 Long term (current) use of antithrombotics/antiplatelets: Secondary | ICD-10-CM | POA: Insufficient documentation

## 2017-11-13 DIAGNOSIS — R778 Other specified abnormalities of plasma proteins: Secondary | ICD-10-CM

## 2017-11-13 DIAGNOSIS — N184 Chronic kidney disease, stage 4 (severe): Secondary | ICD-10-CM | POA: Insufficient documentation

## 2017-11-13 DIAGNOSIS — I130 Hypertensive heart and chronic kidney disease with heart failure and stage 1 through stage 4 chronic kidney disease, or unspecified chronic kidney disease: Secondary | ICD-10-CM | POA: Insufficient documentation

## 2017-11-13 DIAGNOSIS — R7989 Other specified abnormal findings of blood chemistry: Secondary | ICD-10-CM | POA: Insufficient documentation

## 2017-11-13 DIAGNOSIS — J449 Chronic obstructive pulmonary disease, unspecified: Secondary | ICD-10-CM | POA: Diagnosis not present

## 2017-11-13 DIAGNOSIS — Z79899 Other long term (current) drug therapy: Secondary | ICD-10-CM | POA: Diagnosis not present

## 2017-11-13 DIAGNOSIS — I2510 Atherosclerotic heart disease of native coronary artery without angina pectoris: Secondary | ICD-10-CM | POA: Insufficient documentation

## 2017-11-13 DIAGNOSIS — Z7982 Long term (current) use of aspirin: Secondary | ICD-10-CM | POA: Diagnosis not present

## 2017-11-13 DIAGNOSIS — F17210 Nicotine dependence, cigarettes, uncomplicated: Secondary | ICD-10-CM | POA: Insufficient documentation

## 2017-11-13 DIAGNOSIS — I4901 Ventricular fibrillation: Secondary | ICD-10-CM

## 2017-11-13 DIAGNOSIS — F419 Anxiety disorder, unspecified: Secondary | ICD-10-CM | POA: Diagnosis not present

## 2017-11-13 DIAGNOSIS — R112 Nausea with vomiting, unspecified: Secondary | ICD-10-CM | POA: Diagnosis not present

## 2017-11-13 DIAGNOSIS — Z8673 Personal history of transient ischemic attack (TIA), and cerebral infarction without residual deficits: Secondary | ICD-10-CM | POA: Insufficient documentation

## 2017-11-13 DIAGNOSIS — I5042 Chronic combined systolic (congestive) and diastolic (congestive) heart failure: Secondary | ICD-10-CM | POA: Diagnosis not present

## 2017-11-13 LAB — URINALYSIS, COMPLETE (UACMP) WITH MICROSCOPIC
Bilirubin Urine: NEGATIVE
Glucose, UA: NEGATIVE mg/dL
HGB URINE DIPSTICK: NEGATIVE
Ketones, ur: NEGATIVE mg/dL
Leukocytes, UA: NEGATIVE
NITRITE: NEGATIVE
Protein, ur: 30 mg/dL — AB
SPECIFIC GRAVITY, URINE: 1.021 (ref 1.005–1.030)
pH: 5 (ref 5.0–8.0)

## 2017-11-13 LAB — CBC WITH DIFFERENTIAL/PLATELET
BASOS PCT: 1 %
Basophils Absolute: 0 10*3/uL (ref 0–0.1)
EOS PCT: 0 %
Eosinophils Absolute: 0 10*3/uL (ref 0–0.7)
HCT: 48.6 % — ABNORMAL HIGH (ref 35.0–47.0)
Hemoglobin: 16.2 g/dL — ABNORMAL HIGH (ref 12.0–16.0)
Lymphocytes Relative: 13 %
Lymphs Abs: 0.8 10*3/uL — ABNORMAL LOW (ref 1.0–3.6)
MCH: 31.9 pg (ref 26.0–34.0)
MCHC: 33.3 g/dL (ref 32.0–36.0)
MCV: 95.7 fL (ref 80.0–100.0)
MONO ABS: 0.9 10*3/uL (ref 0.2–0.9)
Monocytes Relative: 16 %
Neutro Abs: 4.2 10*3/uL (ref 1.4–6.5)
Neutrophils Relative %: 70 %
PLATELETS: 213 10*3/uL (ref 150–440)
RBC: 5.08 MIL/uL (ref 3.80–5.20)
RDW: 17 % — AB (ref 11.5–14.5)
WBC: 6 10*3/uL (ref 3.6–11.0)

## 2017-11-13 LAB — COMPREHENSIVE METABOLIC PANEL
ALBUMIN: 3.4 g/dL — AB (ref 3.5–5.0)
ALK PHOS: 76 U/L (ref 38–126)
ALT: 14 U/L (ref 14–54)
AST: 37 U/L (ref 15–41)
Anion gap: 14 (ref 5–15)
BILIRUBIN TOTAL: 3.3 mg/dL — AB (ref 0.3–1.2)
BUN: 28 mg/dL — AB (ref 6–20)
CO2: 20 mmol/L — ABNORMAL LOW (ref 22–32)
Calcium: 8.5 mg/dL — ABNORMAL LOW (ref 8.9–10.3)
Chloride: 105 mmol/L (ref 101–111)
Creatinine, Ser: 1.44 mg/dL — ABNORMAL HIGH (ref 0.44–1.00)
GFR calc Af Amer: 41 mL/min — ABNORMAL LOW (ref >60)
GFR calc non Af Amer: 36 mL/min — ABNORMAL LOW (ref >60)
GLUCOSE: 105 mg/dL — AB (ref 65–99)
Potassium: 5.2 mmol/L — ABNORMAL HIGH (ref 3.5–5.1)
Sodium: 139 mmol/L (ref 135–145)
TOTAL PROTEIN: 5.8 g/dL — AB (ref 6.5–8.1)

## 2017-11-13 LAB — GLUCOSE, CAPILLARY: Glucose-Capillary: 93 mg/dL (ref 65–99)

## 2017-11-13 LAB — LIPASE, BLOOD: Lipase: 18 U/L (ref 11–51)

## 2017-11-13 LAB — LACTIC ACID, PLASMA: LACTIC ACID, VENOUS: 3.2 mmol/L — AB (ref 0.5–1.9)

## 2017-11-13 LAB — TROPONIN I: TROPONIN I: 1.16 ng/mL — AB (ref <0.03)

## 2017-11-13 MED ORDER — SODIUM BICARBONATE 8.4 % IV SOLN
INTRAVENOUS | Status: AC | PRN
  Administered 2017-11-13 (×3): 50 meq via INTRAVENOUS

## 2017-11-13 MED ORDER — IOHEXOL 300 MG/ML  SOLN: 75.0000 mL | Freq: Once | INTRAMUSCULAR | Status: DC | PRN

## 2017-11-13 MED ORDER — SODIUM CHLORIDE 0.9 % IV BOLUS
1000.0000 mL | Freq: Once | INTRAVENOUS | Status: AC
  Administered 2017-11-13: 1000 mL via INTRAVENOUS

## 2017-11-13 MED ORDER — ASPIRIN 300 MG RE SUPP
RECTAL | Status: AC
  Filled 2017-11-13: qty 1

## 2017-11-13 MED ORDER — ATROPINE SULFATE 1 MG/ML IJ SOLN
INTRAMUSCULAR | Status: AC | PRN
  Administered 2017-11-13 (×2): 1 mg via INTRAVENOUS

## 2017-11-13 MED ORDER — NOREPINEPHRINE 4 MG/250ML-% IV SOLN
INTRAVENOUS | Status: AC
  Filled 2017-11-13: qty 250

## 2017-11-13 MED ORDER — CALCIUM CHLORIDE 10 % IV SOLN
INTRAVENOUS | Status: AC | PRN
  Administered 2017-11-13 (×2): 1 g via INTRAVENOUS

## 2017-11-13 MED ORDER — ONDANSETRON HCL 4 MG/2ML IJ SOLN
4.0000 mg | Freq: Once | INTRAMUSCULAR | Status: AC
  Administered 2017-11-13: 4 mg via INTRAVENOUS

## 2017-11-13 MED ORDER — DEXTROSE 5 % IV SOLN
INTRAVENOUS | Status: AC | PRN
  Administered 2017-11-13: 150 mg via INTRAVENOUS
  Administered 2017-11-13: 300 mg via INTRAVENOUS
  Administered 2017-11-13: 150 mg via INTRAVENOUS

## 2017-11-13 MED ORDER — EPINEPHRINE PF 1 MG/10ML IJ SOSY
PREFILLED_SYRINGE | INTRAMUSCULAR | Status: AC | PRN
  Administered 2017-11-13 (×11): 1 via INTRAVENOUS

## 2017-11-13 MED ORDER — SODIUM CHLORIDE 0.9 % IV SOLN
INTRAVENOUS | Status: AC | PRN
  Administered 2017-11-13: 1000 mL via INTRAVENOUS

## 2017-11-13 MED ORDER — PROMETHAZINE HCL 25 MG/ML IJ SOLN
12.5000 mg | Freq: Once | INTRAMUSCULAR | Status: AC
  Administered 2017-11-13: 12.5 mg via INTRAVENOUS
  Filled 2017-11-13: qty 1

## 2017-11-13 NOTE — ED Notes (Signed)
Pt states she had chest pain 2 days ago, then states "I put some ice on it and it got better". Crash cart to bedside d/t elevated troponin.

## 2017-11-13 NOTE — ED Notes (Addendum)
Pt in cardiac arrest at 2154 initial rhythm v fib. Pt defib at 2154 at 200j. cpr started, code as follows: Intubated at 2200 with 7.0 at 20 at teeth One amp of epi at : 2157, 2159, 2204, 2208, 2212, 2215, 2218, 2222, 2229, 2233, 2237 One amp of bicarb at : 2158, 2206, 2222 One amp of calcium chloride: 2159, 2205 One amp of atropine at: 2159, 2203 Amiodarone 300mg  at 2214, amiodarone 150mg  at 2219 and 2235 Glucose 93 at 2216 Levophed at 56mcg/min at 2206 Defibrillation at [place] at: 2207, 2214, 2216, 2218, 2225, 2227, 2232, 2236, 2240, 2242, 2243 x2 shocks Granddaughter arrived at bedside at 2245 and asked staff to stop code. Code stopped, aystole at 2246.

## 2017-11-13 NOTE — ED Notes (Signed)
Contacted [place] [facility] jamie lane states pt is not a candidate. Reference number [id number].

## 2017-11-13 NOTE — ED Provider Notes (Addendum)
[facility] Emergency Department Provider Note   ____________________________________________   First MD Initiated Contact with Patient 11/06/2017 2026     (approximate)  I have reviewed the triage vital signs and the nursing notes.   HISTORY  Chief Complaint Emesis   HPI Sandra Ellison is a 72 y.o. female Patient reports dry heaves since last evening. She has no new pain but chronic all over pain she takes meds for she told me that she told the nurse she did not miss any doses. I went back in later rechecked and she still not having any pain. He is not vomiting anymore either. Blood pressure is somewhat low we started 2 IVs and giving her normal saline. patient tells the nurse that 2 days ago she had some pain in her ribs that seemed to get better when she put ice on it.   Past Medical History:  Diagnosis Date  . Allergy   . Arthritis   . Carotid arterial disease ([place])    a. 02/2015 Carotid U/S: RICA 41-58%, LICA 30-94%.  . Chronic combined systolic and diastolic CHF (congestive heart failure) ([place])    a. 01/2012 Echo: EF 30-35%, mod conc LVH, sev inf HK, mildly dil LA, mild-mod MR, Trace TR, RVSP 40-14mHg.    .Marland KitchenChronic kidney disease (CKD), stage II (mild)   . COPD (chronic obstructive pulmonary disease) (HOnward   . Dilated cardiomyopathy (HStaples    a. 02/2011 MV: EF 27%, anteroseptal, inf, inferolateral scar, no ischemia.  .Marland KitchenDyspnea   . GERD (gastroesophageal reflux disease)   . History of abnormal Pap smear    a. s/p vaginal hysterectomy  . History of diverticulosis   . Hx of colonic polyps   . Hyperkalemia    a. 02/2015 in setting of entresto/AKI->entresto d/c'd.  . Hypertension   . Iron deficiency anemia    a. h/o transfusions, prev followed by heme/onc.  . Pulmonary HTN (HColquitt   . Stroke (HWyandot   . TIA (transient ischemic attack)   . Tobacco abuse     Patient Active Problem List   Diagnosis Date Noted  . Right-sided thoracic back pain  09/30/2017  . Sternum pain 07/11/2017  . Unsteady gait 07/11/2017  . Chest pain 05/06/2017  . Renovascular hypertension 02/12/2017  . Renal artery stenosis (HF[place] 02/12/2017  . CKD (chronic kidney disease), stage IV (HBailey 01/24/2017  . Neck pain 01/24/2017  . Constipation 01/24/2017  . Hypoxia 12/16/2016  . CVA (cerebral infarction) 11/25/2015  . TIA (transient ischemic attack) 11/24/2015  . Anxiety 08/31/2015  . Congestive dilated cardiomyopathy (HRichland   . Cardiomyopathy, ischemic 07/24/2015  . Foot swelling 06/15/2015  . Chronic combined systolic and diastolic CHF (congestive heart failure) (HMyers Flat   . Dilated cardiomyopathy (HRising Star   . Carotid arterial disease (HParkdale   . Tobacco abuse   . Essential hypertension 03/17/2015  . CKD (chronic kidney disease), stage III (HAvoca 03/06/2015  . Abnormal mammogram 02/23/2015  . Encounter for screening colonoscopy 01/26/2015  . Health care maintenance 08/12/2014  . Chronic systolic CHF (congestive heart failure) (HBrowning 05/21/2014  . Microcalcifications of the breast 01/24/2014  . Pulmonary nodule 05/20/2013  . Insomnia 03/08/2013  . Ulnar nerve injury 03/07/2013  . Gastroesophageal reflux disease 02/28/2013  . History of colonic polyps 02/28/2013  . Diverticulosis 02/28/2013  . Anemia 02/28/2013  . COPD (chronic obstructive pulmonary disease) (HSoda Bay 02/28/2013  . Pulmonary hypertension (HJefferson Valley[place]York[place] 02/28/2013  . Nonischemic cardiomyopathy (HGold River 02/26/2012  . Carotid bruit 02/26/2012  .  SOB (shortness of breath) 03/03/2011  . Smoking 03/03/2011  . Hyperlipidemia 03/03/2011  . CAD (coronary artery disease) 03/03/2011    Past Surgical History:  Procedure Laterality Date  . CARDIAC CATHETERIZATION Left 08/05/2015   Procedure: Left Heart Cath and Coronary Angiography;  Surgeon: Minna Merritts, MD;  Location: [place] [facility];  Service: Cardiovascular;  Laterality: Left;  . COLONOSCOPY  10/2010  . COLONOSCOPY WITH PROPOFOL N/A 12/15/2016    Procedure: COLONOSCOPY WITH PROPOFOL;  Surgeon: [name], MD;  Location: [facility] ENDOSCOPY;  Service: Endoscopy;  Laterality: N/A;  . ESOPHAGOGASTRODUODENOSCOPY (EGD) WITH PROPOFOL N/A 12/15/2016   Procedure: ESOPHAGOGASTRODUODENOSCOPY (EGD) WITH PROPOFOL;  Surgeon: [name], MD;  Location: [facility] ENDOSCOPY;  Service: Endoscopy;  Laterality: N/A;  . kidney stent    . ORIF DISTAL RADIUS FRACTURE  2011   right  . RENAL ANGIOGRAPHY Right 02/17/2017   Procedure: Renal Angiography;  Surgeon: Algernon Huxley, MD;  Location: [place] [facility];  Service: Cardiovascular;  Laterality: Right;  . TONSILLECTOMY  age 52  . VAGINAL HYSTERECTOMY  1981   secondary to abnormal pap smear    Prior to Admission medications   Medication Sig Start Date End Date Taking? Authorizing Provider  albuterol (PROVENTIL) (2.5 MG/3ML) 0.083% nebulizer solution USE ONE TREATMENT EVERY 6 HOURS AS NEEDED 10/20/17   Einar Pheasant, MD  aspirin EC 81 MG tablet Take 81 mg by mouth daily.    [provider]  carvedilol (COREG) 6.25 MG tablet Take 1 tablet (6.25 mg total) by mouth 2 (two) times daily with a meal. 03/24/17   Gollan, Kathlene November, MD  citalopram (CELEXA) 10 MG tablet TAKE 1 TABLET (10 MG TOTAL) BY MOUTH DAILY IN THE EVENING. 11/09/17   Einar Pheasant, MD  clopidogrel (PLAVIX) 75 MG tablet Take 1 tablet (75 mg total) by mouth daily. 03/24/17   Minna Merritts, MD  Cyanocobalamin 1000 MCG/ML KIT Inject 1 mL as directed every 30 (thirty) days.    [provider]  furosemide (LASIX) 20 MG tablet Take 2 tablets (40 mg total) by mouth 2 (two) times daily as needed. 03/24/17   Minna Merritts, MD  gabapentin (NEURONTIN) 100 MG capsule Take 100 mg by mouth 3 (three) times daily. 12/28/16   [provider]  gabapentin (NEURONTIN) 300 MG capsule TAKE 1 CAPSULE (300 MG TOTAL) BY MOUTH 3 (THREE) TIMES DAILY. 07/19/17   Einar Pheasant, MD  oxyCODONE (ROXICODONE) 5 MG immediate release tablet  Take 1 tablet (5 mg total) by mouth every 4 (four) hours as needed for severe pain. 11/04/17   Eula Listen, MD  pantoprazole (PROTONIX) 40 MG tablet Take 1 tablet (40 mg total) by mouth 2 (two) times daily. 08/10/17   Einar Pheasant, MD  tiotropium (SPIRIVA HANDIHALER) 18 MCG inhalation capsule INHALE ONCE DAILY 07/15/17   Einar Pheasant, MD  tiZANidine (ZANAFLEX) 4 MG tablet Take 1 tablet (4 mg total) by mouth at bedtime as needed for muscle spasms. 09/02/17   Einar Pheasant, MD  triamcinolone ([employer] NASAL ALLERGY SPRAY) 55 MCG/ACT AERO nasal inhaler Place 2 sprays into the nose daily as needed (for allergies).    [provider]  VENTOLIN HFA 108 (90 Base) MCG/ACT inhaler INHALE 2 PUFFS BY MOUTH EVERY 6 HOURS AS NEEDED 11/09/17   Einar Pheasant, MD  Vitamin D, Ergocalciferol, (DRISDOL) 50000 units CAPS capsule Take 50,000 Units by mouth every Friday.     [provider]    Allergies Ranitidine hcl and  Entresto [sacubitril-valsartan]  Family History  Problem Relation Age of Onset  . Arthritis Mother   . Alcohol abuse Father   . Arthritis Father   . Cancer Father        throat/spine  . Breast cancer Neg Hx     Social History Social History   Tobacco Use  . Smoking status: Current Every Day Smoker    Packs/day: 0.50    Years: 42.00    Pack years: 21.00    Types: Cigarettes  . Smokeless tobacco: Never Used  . Tobacco comment: Down to 3 cigarettes per day  Substance Use Topics  . Alcohol use: No    Alcohol/week: 0.0 oz  . Drug use: No    Review of Systems  Constitutional: No fever/chills Eyes: No visual changes. ENT: No sore throat. Cardiovascular: Denies chest pain. Respiratory: Denies shortness of breath. Gastrointestinal: No abdominal pain.  No nausea, vomiting.  No diarrhea.  No constipation. Genitourinary: Negative for dysuria. Musculoskeletal: Negative for back pain. Skin: Negative for rash. Neurological: Negative for headaches, focal  weakness   ____________________________________________   PHYSICAL EXAM:  VITAL SIGNS: ED Triage Vitals  Enc Vitals Group     BP 11/22/2017 2026 (!) 73/62     Pulse Rate 11/10/2017 2024 92     Resp 11/15/2017 2024 18     Temp 12/04/2017 2024 (!) 97.2 F (36.2 C)     Temp Source 12/02/2017 2024 Oral     SpO2 11/21/2017 2024 96 %     Weight 11/29/2017 2025 130 lb (59 kg)     Height 11/28/2017 2025 _0  (1.651 m)     Head Circumference --      Peak Flow --      Pain Score --      Pain Loc --      Pain Edu? --      Excl. in [place]? --    Constitutional: Alert and oriented. with dry heaves Eyes: Conjunctivae are normal. PER. EOMI. Head: Atraumatic. Nose: No congestion/rhinnorhea. Mouth/Throat: Mucous membranes are moist.  Oropharynx non-erythematous. Neck: No stridor. Cardiovascular: Normal rate, regular rhythm. Grossly normal heart sounds.  Good peripheral circulation. Respiratory: Normal respiratory effort.  No retractions. Lungs CTAB. Gastrointestinal: Soft and nontender. No distention. No abdominal bruits. No CVA tenderness. Musculoskeletal: No lower extremity tenderness nor edema.   Neurologic:  Normal speech and language. No gross focal neurologic deficits are appreciated.  Skin:  Skin is warm, dry and intact. No rash noted. Psychiatric: Mood and affect are normal. Speech and behavior are normal.  ____________________________________________   LABS (all labs ordered are listed, but only abnormal results are displayed)  Labs Reviewed  TROPONIN I - Abnormal; Notable for the following components:      Result Value   Troponin I 1.16 (*)    All other components within normal limits  CBC WITH DIFFERENTIAL/PLATELET - Abnormal; Notable for the following components:   Hemoglobin 16.2 (*)    HCT 48.6 (*)    RDW 17.0 (*)    Lymphs Abs 0.8 (*)    All other components within normal limits  URINALYSIS, COMPLETE (UACMP) WITH MICROSCOPIC - Abnormal; Notable for the following components:    Color, Urine AMBER (*)    APPearance HAZY (*)    Protein, ur 30 (*)    Bacteria, UA RARE (*)    All other components within normal limits  LACTIC ACID, PLASMA - Abnormal; Notable for the following components:   Lactic Acid, Venous 3.2 (*)  All other components within normal limits  COMPREHENSIVE METABOLIC PANEL - Abnormal; Notable for the following components:   Potassium 5.2 (*)    CO2 20 (*)    Glucose, Bld 105 (*)    BUN 28 (*)    Creatinine, Ser 1.44 (*)    Calcium 8.5 (*)    Total Protein 5.8 (*)    Albumin 3.4 (*)    Total Bilirubin 3.3 (*)    GFR calc non Af Amer 36 (*)    GFR calc Af Amer 41 (*)    All other components within normal limits  LIPASE, BLOOD  GLUCOSE, CAPILLARY   ____________________________________________  EKG  EKG read and interpreted by me shows normal sinus rhythm rate of 96 left axis flipped T's in 1 and L there are old EKG looks similar to one from 04 Nov 2017. ____________________________________________  [place]  ED MD interpretation:  chest x-ray cardiomegaly reviewed by me and read by radiology no acute disease.  Official radiology report(s): Dg Chest Portable 1 View  Result Date: 11/18/2017 CLINICAL DATA:  Nausea and vomiting beginning last night with pain all over. EXAM: PORTABLE CHEST 1 VIEW COMPARISON:  None. FINDINGS: Lungs are adequately inflated without focal airspace consolidation or effusion. Subtle bilateral interstitial prominence which is chronic. Moderate cardiomegaly unchanged. Remainder of the exam is unchanged. IMPRESSION: No acute cardiopulmonary disease. Moderate stable cardiomegaly. Subtle chronic interstitial disease. Electronically Signed   By: Marin Olp M.D.   On: 12/03/2017 21:19    ____________________________________________   PROCEDURES  Procedure(s) performed:   Procedures  Critical Care performed: critical care time one hour this includes running the code intubating the patient actually performing CPR  briefly and defibrillating the patient myself. ____________________________________________   INITIAL IMPRESSION / ASSESSMENT AND PLAN / ED COURSE  patient says she hasn't missed any doses for pain medicine. Her urine is dark colored hematocrit is elevated but has been at that level previously lactic acid is 3.2 but she is afebrile.  ----------------------------------------- 10:50 PM on 12/02/2017 -----------------------------------------  Patient coded. Had not had lites back yet because first set had hemolyzed. Patient had precordial thump done CPR started and was defibrillated immediately as she had gone into V. fib immediately. There was no response. Patient then got doses of epi to 11 doses,  atropine and calcium and bicarbonate without any long-standing return to sinus rhythm. patient was intubated with a #0.5 tube under direct vision by myself with good bilateral breath sounds and no breath sounds and stomach We did get a pulse back briefly but she deteriorated back into V. fib again. When the pulse ox come back when we started norepinephrine. Patient also got amiodarone 3 doses.patient was unable to sustain any rhythm but V. fib. Patient's granddaughter arrived after doing the code for about 50 minutes. Patient's granddaughter asked [place] to stop and we did so. Patient still in V. fib without a pulse still making some agonal respiratory movements. Pupils are dilated and fixed.       ____________________________________________   FINAL CLINICAL IMPRESSION(S) / ED DIAGNOSES  Final diagnoses:  Non-intractable vomiting with nausea, unspecified vomiting type  Elevated troponin  Ventricular fibrillation [facility])     ED Discharge Orders    None       Note:  This document was prepared using Dragon voice recognition software and may include unintentional dictation errors.    Nena Polio, MD 11/07/2017 2255    Nena Polio, MD 12/04/2017  2257 ----------------------------------------- 11:15 PM on  12/02/2017 -----------------------------------------  I spoke with Dr. Suzie Portela who is on-call for medical examiner's office. He does not feel that this is a medical examiner's case. Patient will have to pay for an autopsy herself. He does not know does them privately now although he has heard there someone in [place] it doesn't [facility] does no longer do them. Patient unfortunately has left I do not have her contact information. I will let the charge nurse know I have also let the doctor on-call for lumbar noticed information. He confirmed the past medical history the patient.the lower doctor's name is Dr.Wendling.   Nena Polio, MD 11/25/2017 2317 charge nurse and nursing supervisor will work on informing the family and see if we can find someone who does private postmortem's.   Nena Polio, MD 11/24/2017 [phone]

## 2017-11-13 NOTE — Telephone Encounter (Signed)
ER provider called to let me know pt had passed away. States it is not ME case, would pcp fill out death certificate. I stated that our group does usually take care of this with proper paperwork. No other questions.

## 2017-11-13 NOTE — ED Notes (Signed)
Pt placed on oxygen at 2lpm for tachypnea. Pt tolerating well.

## 2017-11-13 NOTE — Progress Notes (Signed)
Chaplain was paged for a Pt who has coded. Chaplain maintained a presence with the team as they continued CPR. The pt significant other came back and Chaplain escorted him to the consult room and maintained pastoral presence as the team continued CPR. Chaplain waited with SO until granddaughter [place]) came. Chaplain escorted family  to room at which time she told them to stop CPR. Chaplain continued support as family consulted with staff about arraignment of Pt's daughter to see the body. Chaplain got release information and supported family. Family left shortly thereafter. Chaplain suggested that daughter not to rush [place] and to view the body in the morgue.    12/05/2017 2300  Clinical Encounter Type  Visited With Patient;Family  Visit Type Initial;Spiritual support;Code;Death  Referral From Nurse  Spiritual Encounters  Spiritual Needs Prayer

## 2017-11-13 NOTE — ED Notes (Signed)
Pt continues to retch. Will notify md.

## 2017-11-13 NOTE — ED Notes (Signed)
Critical lactic acid of 3.2 called from lab. Dr. Cinda Quest notified, no new orders received.

## 2017-11-13 NOTE — ED Triage Notes (Signed)
Pt with nausea and vomiting that started last pm. Pt is retching during triage. Pt denies new pain, has chronic "all over" pain she is taking pain medication for, pt states she has not missed pain medication dose.

## 2017-11-13 NOTE — ED Notes (Signed)
Additional green top drawn and sent to lab. Critical troponin of 1.16 called from lab. Dr. Cinda Quest notified, no new orders received.

## 2017-11-13 NOTE — ED Notes (Signed)
Pt ringing call bell approx every 30 seconds to inform staff "I don't fell good, I want to feel better". Significant emotional reassurance provided to pt.

## 2017-11-14 MED FILL — Medication: Qty: 1 | Status: AC

## 2017-11-15 ENCOUNTER — Telehealth: Payer: Self-pay | Admitting: Internal Medicine

## 2017-11-15 NOTE — Telephone Encounter (Signed)
Pt's death certificate has been dropped off for Dr. Nicki Reaper to sign. Certificate is up front in colored folder. Please call Denice Paradise and Grandville Silos when ready for pick up.

## 2017-11-15 NOTE — Telephone Encounter (Signed)
Placed in your quick sign folder  

## 2017-11-16 ENCOUNTER — Telehealth: Payer: Self-pay

## 2017-11-16 NOTE — Telephone Encounter (Signed)
Death certificate handed to Santiago Glad

## 2017-11-16 NOTE — Telephone Encounter (Signed)
Death certificate is in your folder. Once completed I will call them and let them know

## 2017-11-16 NOTE — Telephone Encounter (Signed)
Death Certificate has been picked up.

## 2017-11-16 NOTE — Telephone Encounter (Signed)
Copied from [place] [id number]. Topic: General - Other >> Nov 16, 2017 11:09 AM Keene Breath wrote: Reason for CRM: Velva Harman with [employer] called requesting a signature for cremation.  Tried to get signature yesterday but doctor was out of office.  The form is at the office and the [facility] would like it signed as soon as possible since it is for a  cremation.  CB# [phone].

## 2017-11-16 NOTE — Telephone Encounter (Signed)
Death certificate signed and placed in box.   

## 2017-12-06 NOTE — ED Notes (Signed)
Pt to morgue, all equipment remains in place.

## 2017-12-06 DEATH — deceased

## 2017-12-14 ENCOUNTER — Ambulatory Visit: Payer: Medicare HMO

## 2018-01-04 ENCOUNTER — Ambulatory Visit (INDEPENDENT_AMBULATORY_CARE_PROVIDER_SITE_OTHER): Payer: Self-pay | Admitting: Vascular Surgery

## 2018-01-04 ENCOUNTER — Encounter (INDEPENDENT_AMBULATORY_CARE_PROVIDER_SITE_OTHER): Payer: Self-pay

## 2018-01-10 ENCOUNTER — Ambulatory Visit: Payer: Medicare HMO | Admitting: Internal Medicine

## 2018-02-05 ENCOUNTER — Other Ambulatory Visit: Payer: Self-pay | Admitting: Internal Medicine

## 2018-05-05 IMAGING — CT CT ANGIO HEAD
4 of 8 series · 18 of 47 positions shown · IV contrast (APPLIED)
Comparison: CT head without contrast 12/30/2016. MRI brain
11/25/2015.

CLINICAL DATA: Worst headache of life.

EXAM:
CT ANGIOGRAPHY HEAD
TECHNIQUE: Multidetector CT imaging of the head was performed using the
standard protocol during bolus administration of intravenous
contrast. Multiplanar CT image reconstructions and MIPs were
obtained to evaluate the vascular anatomy.
CONTRAST:  60 mL Isovue 370

[Series 4: cta head · axial · 0.40mm/px · z∈[-108,-2]mm · 7 of 71 slices shown]
[im 9/71  brain]
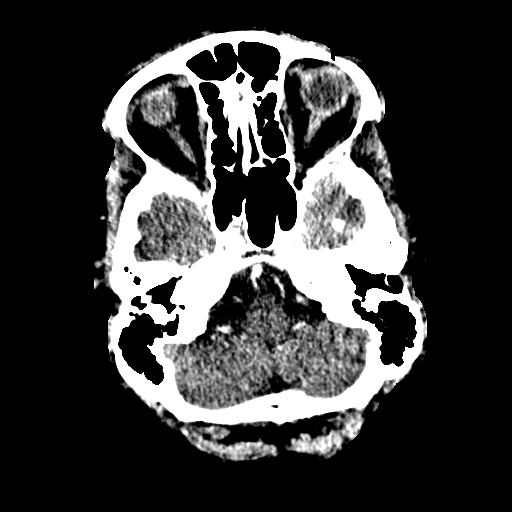
[im 18/71  bone]
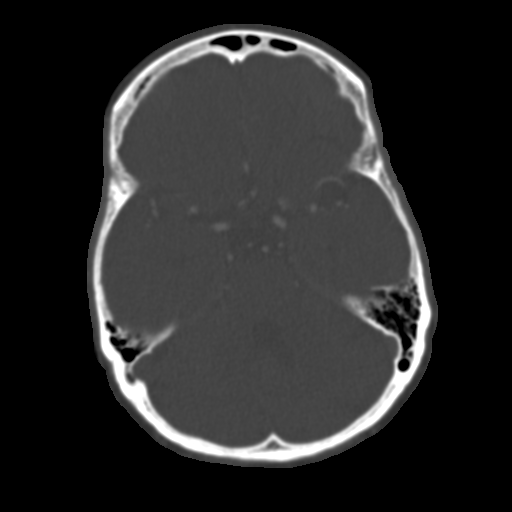
[im 27/71  brain]
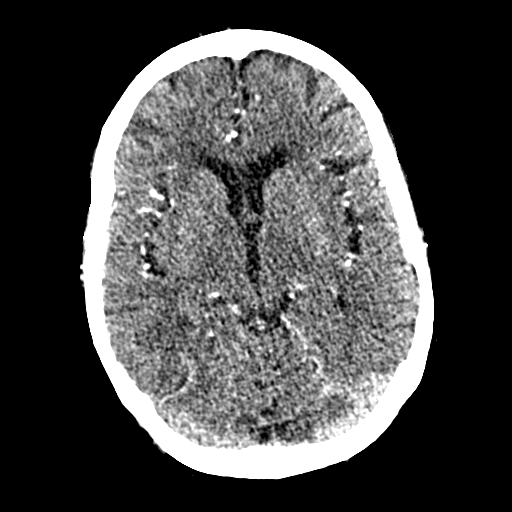
[im 36/71  bone]
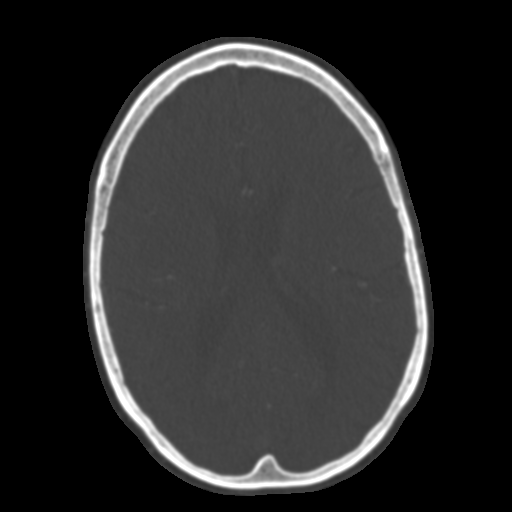
[im 44/71  brain]
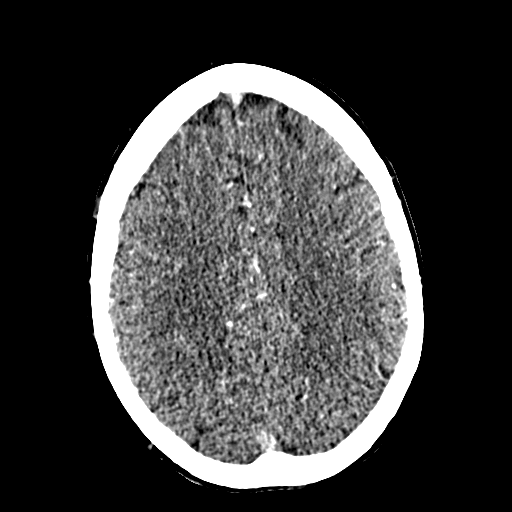
[im 53/71  bone]
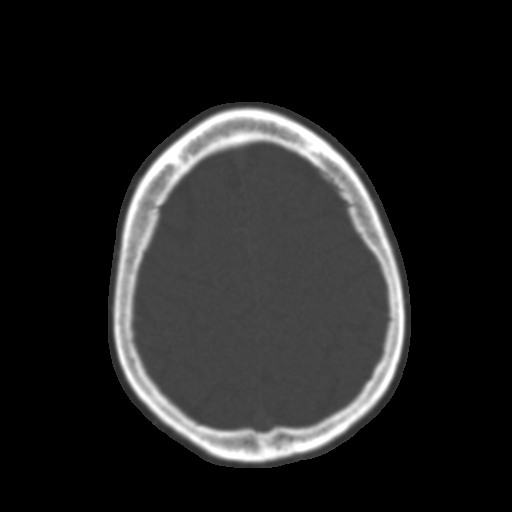
[im 62/71  brain]
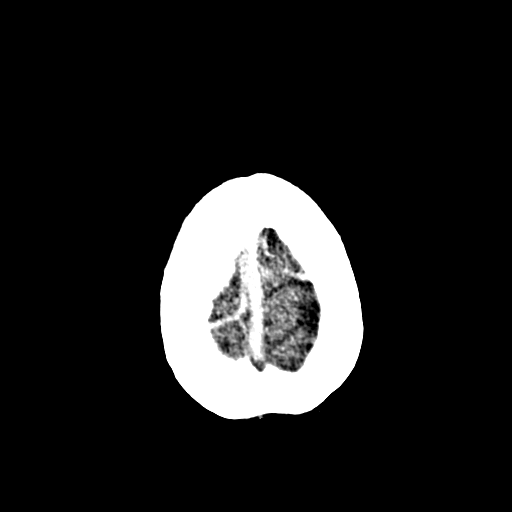

[Series 6: ax thin · axial · 0.39mm/px · z∈[-126,-58]mm · 5 of 135 slices shown]
[im 10/135  brain]
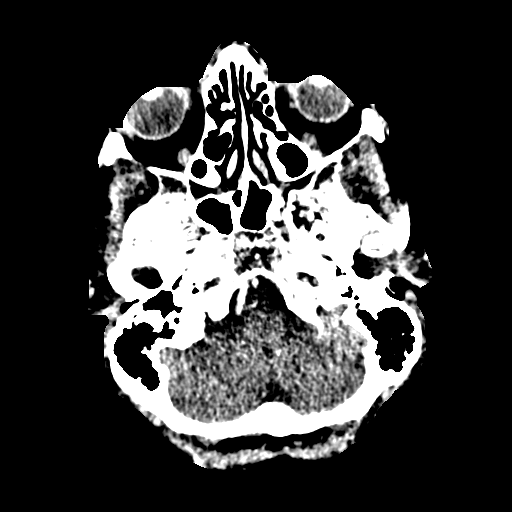
[im 29/135  brain]
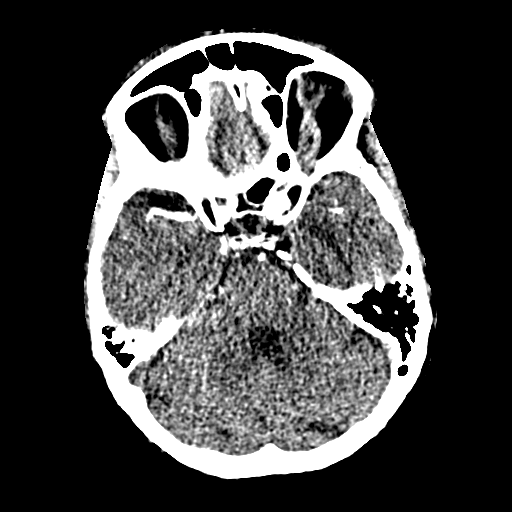
[im 48/135  brain]
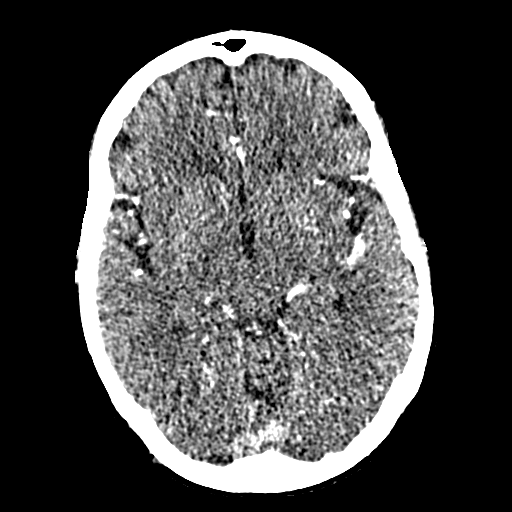
[im 58/135  brain]
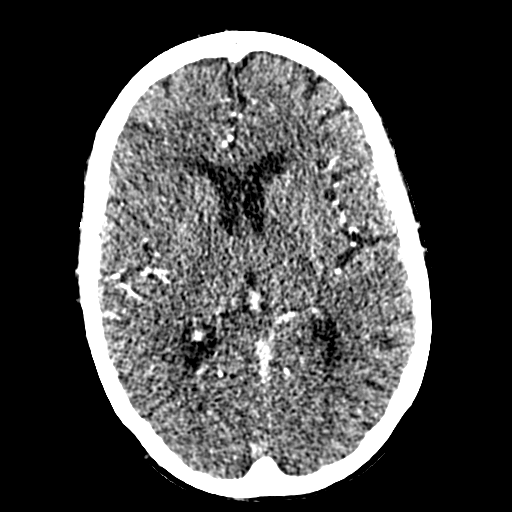
[im 77/135  brain]
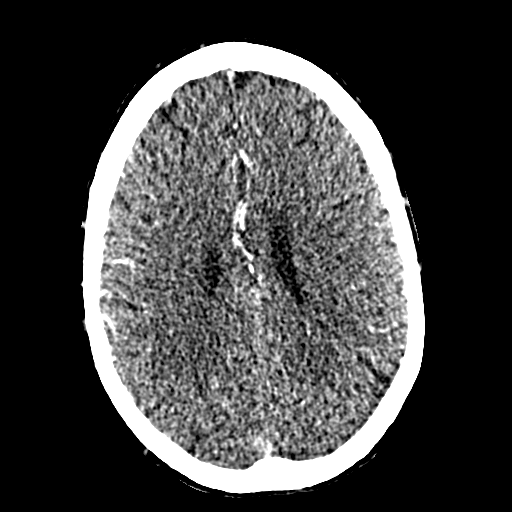

[Series 8: cor thin · coronal · 0.32mm/px · 3 of 188 slices shown]
[im 54/188  brain]
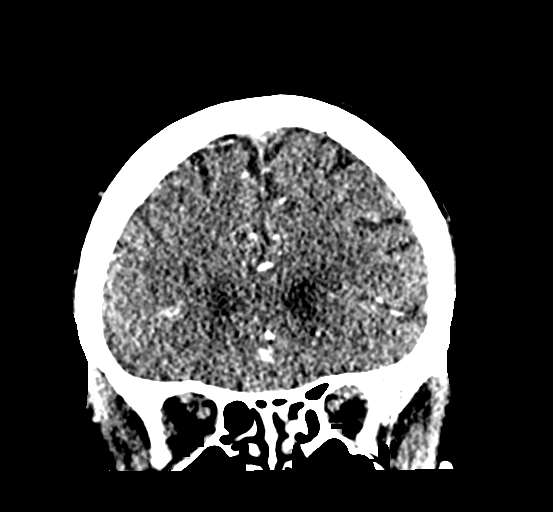
[im 81/188  brain]
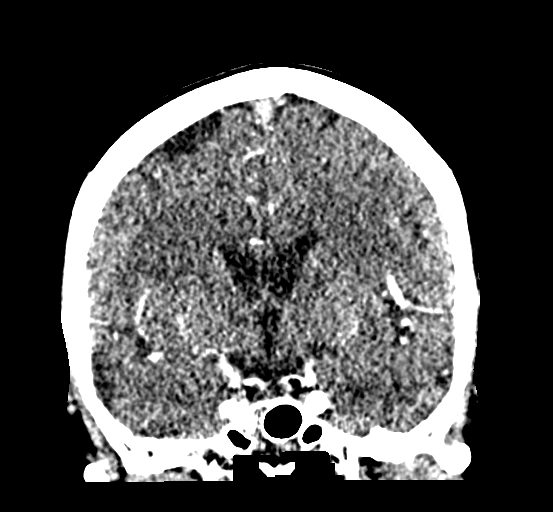
[im 107/188  brain]
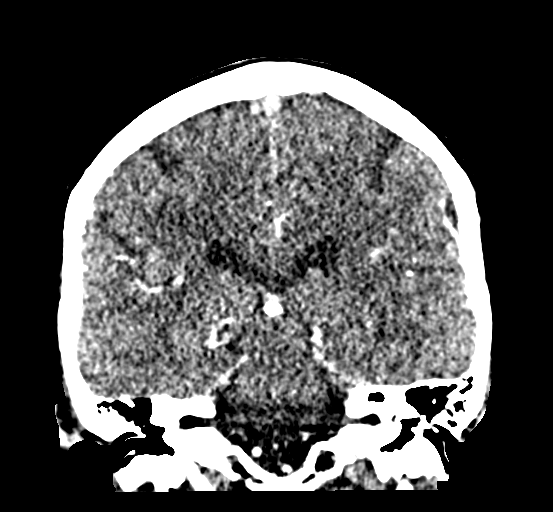

[Series 10: sag thin · sagittal · 0.35mm/px · 3 of 146 slices shown]
[im 30/146  brain]
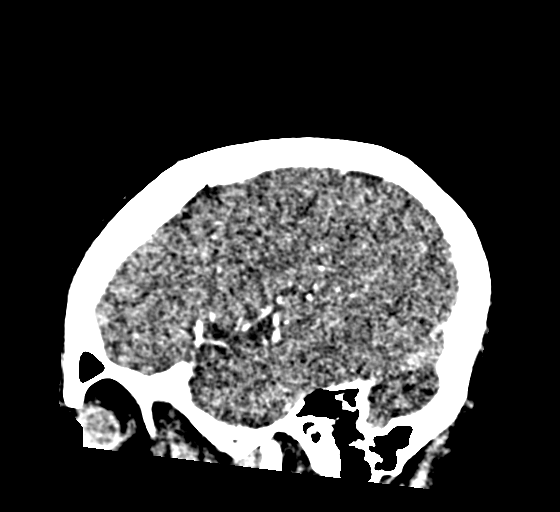
[im 59/146  brain]
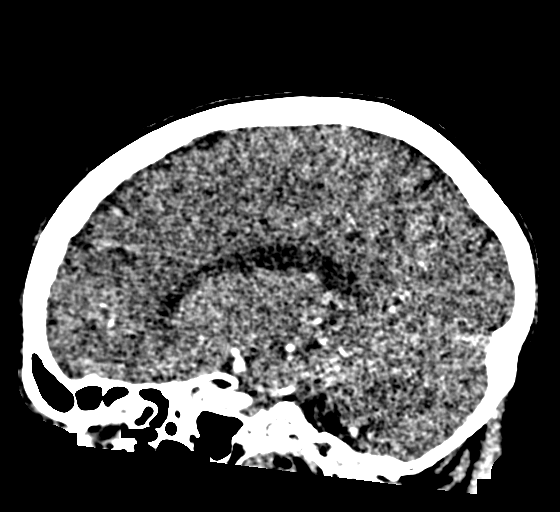
[im 88/146  brain]
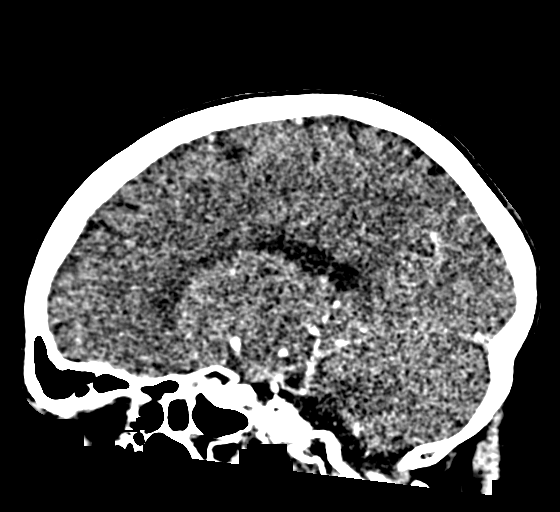

[18 of 47 positions shown; findings below may reference images not displayed]

FINDINGS: CTA HEAD

Anterior circulation: The internal carotid artery's demonstrate
atherosclerotic calcifications in the cavernous segments bilaterally
without a significant stenosis relative to the more distal vessel.
The ICA termini are within normal limits bilaterally. The A1 and M1
segments are within normal limits. The anterior communicating artery
is patent. The MCA bifurcations are intact. There is mild
irregularity of MCA branch vessels bilaterally without a significant
proximal stenosis or occlusion. No aneurysm is present.

Posterior circulation: The right vertebral artery is dominant. The
PICA origins are not visualized. Dominant AICA vessels are seen
bilaterally. The basilar artery is small. Both posterior cerebral
arteries are of fetal type. A small left P1 segment is present.
Segmental irregularity is present in the distal PCA branch vessels
bilaterally without a significant proximal stenosis or occlusion.

Venous sinuses: The dural sinuses are patent. Straight sinus and
deep cerebral veins are intact. The cortical veins are unremarkable.

Anatomic variants: Bilateral fetal type posterior cerebral arteries.

Delayed phase: The postcontrast images demonstrate no pathologic
enhancement.
IMPRESSION: 1. No aneurysm or hemorrhage.
2. No emerging large vessel occlusion.
3. Atherosclerotic changes of the cavernous internal carotid
arteries bilaterally without a significant stenosis.
